# Patient Record
Sex: Female | Born: 1945 | Race: White | Hispanic: No | Marital: Single | Smoking: Former smoker
Health system: Southern US, Community
[De-identification: ages and names within clinical notes are randomized; demographics above are authoritative.]

## PROBLEM LIST (undated history)

## (undated) DIAGNOSIS — J449 Chronic obstructive pulmonary disease, unspecified: Secondary | ICD-10-CM

## (undated) DIAGNOSIS — R17 Unspecified jaundice: Secondary | ICD-10-CM

## (undated) DIAGNOSIS — R0600 Dyspnea, unspecified: Secondary | ICD-10-CM

## (undated) DIAGNOSIS — C801 Malignant (primary) neoplasm, unspecified: Secondary | ICD-10-CM

## (undated) DIAGNOSIS — M1990 Unspecified osteoarthritis, unspecified site: Secondary | ICD-10-CM

## (undated) HISTORY — PX: TONSILLECTOMY: SUR1361

## (undated) HISTORY — PX: COLONOSCOPY: SHX174

## (undated) HISTORY — PX: BUNIONECTOMY: SHX129

---

## 2006-04-01 ENCOUNTER — Ambulatory Visit: Payer: Self-pay | Admitting: Unknown Physician Specialty

## 2006-06-04 ENCOUNTER — Ambulatory Visit: Payer: Self-pay | Admitting: Obstetrics and Gynecology

## 2006-07-30 ENCOUNTER — Ambulatory Visit: Payer: Self-pay | Admitting: Unknown Physician Specialty

## 2007-06-17 ENCOUNTER — Ambulatory Visit: Payer: Self-pay | Admitting: Specialist

## 2007-08-03 ENCOUNTER — Ambulatory Visit: Payer: Self-pay | Admitting: Unknown Physician Specialty

## 2007-09-04 ENCOUNTER — Ambulatory Visit: Payer: Self-pay | Admitting: Allergy

## 2008-06-21 ENCOUNTER — Ambulatory Visit: Payer: Self-pay | Admitting: Specialist

## 2009-07-06 ENCOUNTER — Ambulatory Visit: Payer: Self-pay | Admitting: Specialist

## 2010-07-10 ENCOUNTER — Ambulatory Visit: Payer: Self-pay | Admitting: Specialist

## 2011-07-25 ENCOUNTER — Ambulatory Visit: Payer: Self-pay | Admitting: Specialist

## 2012-07-27 ENCOUNTER — Ambulatory Visit: Payer: Self-pay | Admitting: Specialist

## 2012-08-07 ENCOUNTER — Ambulatory Visit: Payer: Self-pay | Admitting: Unknown Physician Specialty

## 2013-07-28 ENCOUNTER — Ambulatory Visit: Payer: Self-pay | Admitting: Family Medicine

## 2013-08-03 DIAGNOSIS — Z85820 Personal history of malignant melanoma of skin: Secondary | ICD-10-CM | POA: Insufficient documentation

## 2014-07-29 ENCOUNTER — Ambulatory Visit: Payer: Self-pay | Admitting: Family Medicine

## 2015-06-22 ENCOUNTER — Other Ambulatory Visit: Payer: Self-pay | Admitting: Family Medicine

## 2015-06-22 DIAGNOSIS — Z1231 Encounter for screening mammogram for malignant neoplasm of breast: Secondary | ICD-10-CM

## 2015-08-01 ENCOUNTER — Ambulatory Visit
Admission: RE | Admit: 2015-08-01 | Discharge: 2015-08-01 | Disposition: A | Discharge status: Home / Self Care | Payer: Medicare Other | Source: Ambulatory Visit | Attending: Family Medicine | Admitting: Family Medicine

## 2015-08-01 DIAGNOSIS — Z1231 Encounter for screening mammogram for malignant neoplasm of breast: Secondary | ICD-10-CM | POA: Diagnosis present

## 2015-08-01 HISTORY — DX: Malignant (primary) neoplasm, unspecified: C80.1

## 2016-06-14 ENCOUNTER — Other Ambulatory Visit: Payer: Self-pay | Admitting: Family Medicine

## 2016-06-21 ENCOUNTER — Other Ambulatory Visit: Payer: Self-pay | Admitting: Family Medicine

## 2016-06-21 DIAGNOSIS — Z7185 Encounter for immunization safety counseling: Secondary | ICD-10-CM | POA: Insufficient documentation

## 2016-06-21 DIAGNOSIS — Z1231 Encounter for screening mammogram for malignant neoplasm of breast: Secondary | ICD-10-CM

## 2016-06-21 DIAGNOSIS — Z7189 Other specified counseling: Secondary | ICD-10-CM | POA: Insufficient documentation

## 2016-08-02 ENCOUNTER — Ambulatory Visit
Admission: RE | Admit: 2016-08-02 | Discharge: 2016-08-02 | Disposition: A | Discharge status: Home / Self Care | Payer: Medicare Other | Source: Ambulatory Visit | Attending: Family Medicine | Admitting: Family Medicine

## 2016-08-02 ENCOUNTER — Other Ambulatory Visit: Payer: Self-pay | Admitting: Family Medicine

## 2016-08-02 DIAGNOSIS — Z1231 Encounter for screening mammogram for malignant neoplasm of breast: Secondary | ICD-10-CM | POA: Diagnosis present

## 2016-10-06 ENCOUNTER — Emergency Department: Payer: Medicare Other

## 2016-10-06 ENCOUNTER — Inpatient Hospital Stay
Admission: EM | Admit: 2016-10-06 | Discharge: 2016-10-10 | DRG: 190 | Disposition: A | Discharge status: Home Health | Payer: Medicare Other | Attending: Internal Medicine | Admitting: Internal Medicine

## 2016-10-06 ENCOUNTER — Encounter: Payer: Self-pay | Admitting: Emergency Medicine

## 2016-10-06 DIAGNOSIS — Z79899 Other long term (current) drug therapy: Secondary | ICD-10-CM | POA: Diagnosis not present

## 2016-10-06 DIAGNOSIS — J440 Chronic obstructive pulmonary disease with acute lower respiratory infection: Secondary | ICD-10-CM | POA: Diagnosis present

## 2016-10-06 DIAGNOSIS — M6281 Muscle weakness (generalized): Secondary | ICD-10-CM

## 2016-10-06 DIAGNOSIS — J9600 Acute respiratory failure, unspecified whether with hypoxia or hypercapnia: Secondary | ICD-10-CM

## 2016-10-06 DIAGNOSIS — J9621 Acute and chronic respiratory failure with hypoxia: Secondary | ICD-10-CM | POA: Diagnosis present

## 2016-10-06 DIAGNOSIS — F419 Anxiety disorder, unspecified: Secondary | ICD-10-CM | POA: Diagnosis present

## 2016-10-06 DIAGNOSIS — R739 Hyperglycemia, unspecified: Secondary | ICD-10-CM

## 2016-10-06 DIAGNOSIS — Z85820 Personal history of malignant melanoma of skin: Secondary | ICD-10-CM

## 2016-10-06 DIAGNOSIS — R0602 Shortness of breath: Secondary | ICD-10-CM | POA: Diagnosis present

## 2016-10-06 DIAGNOSIS — Z880 Allergy status to penicillin: Secondary | ICD-10-CM | POA: Diagnosis not present

## 2016-10-06 DIAGNOSIS — J449 Chronic obstructive pulmonary disease, unspecified: Secondary | ICD-10-CM

## 2016-10-06 DIAGNOSIS — J9601 Acute respiratory failure with hypoxia: Secondary | ICD-10-CM | POA: Diagnosis not present

## 2016-10-06 DIAGNOSIS — J441 Chronic obstructive pulmonary disease with (acute) exacerbation: Secondary | ICD-10-CM | POA: Diagnosis present

## 2016-10-06 DIAGNOSIS — R0902 Hypoxemia: Secondary | ICD-10-CM

## 2016-10-06 DIAGNOSIS — J189 Pneumonia, unspecified organism: Secondary | ICD-10-CM | POA: Diagnosis present

## 2016-10-06 DIAGNOSIS — F17210 Nicotine dependence, cigarettes, uncomplicated: Secondary | ICD-10-CM | POA: Diagnosis present

## 2016-10-06 DIAGNOSIS — J181 Lobar pneumonia, unspecified organism: Secondary | ICD-10-CM | POA: Diagnosis not present

## 2016-10-06 HISTORY — DX: Chronic obstructive pulmonary disease, unspecified: J44.9

## 2016-10-06 LAB — COMPREHENSIVE METABOLIC PANEL
ALK PHOS: 66 U/L (ref 38–126)
ALT: 15 U/L (ref 14–54)
AST: 21 U/L (ref 15–41)
Albumin: 4.4 g/dL (ref 3.5–5.0)
Anion gap: 7 (ref 5–15)
BUN: 10 mg/dL (ref 6–20)
CALCIUM: 9.3 mg/dL (ref 8.9–10.3)
CHLORIDE: 99 mmol/L — AB (ref 101–111)
CO2: 30 mmol/L (ref 22–32)
CREATININE: 0.61 mg/dL (ref 0.44–1.00)
Glucose, Bld: 147 mg/dL — ABNORMAL HIGH (ref 65–99)
Potassium: 4.7 mmol/L (ref 3.5–5.1)
Sodium: 136 mmol/L (ref 135–145)
TOTAL PROTEIN: 7.7 g/dL (ref 6.5–8.1)
Total Bilirubin: 0.7 mg/dL (ref 0.3–1.2)

## 2016-10-06 LAB — TROPONIN I

## 2016-10-06 LAB — PROCALCITONIN

## 2016-10-06 LAB — MRSA PCR SCREENING: MRSA by PCR: NEGATIVE

## 2016-10-06 LAB — CBC
HCT: 54.7 % — ABNORMAL HIGH (ref 35.0–47.0)
Hemoglobin: 17.9 g/dL — ABNORMAL HIGH (ref 12.0–16.0)
MCH: 32.3 pg (ref 26.0–34.0)
MCHC: 32.7 g/dL (ref 32.0–36.0)
MCV: 98.8 fL (ref 80.0–100.0)
PLATELETS: 283 10*3/uL (ref 150–440)
RBC: 5.53 MIL/uL — AB (ref 3.80–5.20)
RDW: 14.4 % (ref 11.5–14.5)
WBC: 10.8 10*3/uL (ref 3.6–11.0)

## 2016-10-06 LAB — GLUCOSE, CAPILLARY
GLUCOSE-CAPILLARY: 154 mg/dL — AB (ref 65–99)
GLUCOSE-CAPILLARY: 157 mg/dL — AB (ref 65–99)

## 2016-10-06 MED ORDER — IPRATROPIUM-ALBUTEROL 0.5-2.5 (3) MG/3ML IN SOLN
3.0000 mL | Freq: Once | RESPIRATORY_TRACT | Status: AC
  Administered 2016-10-06: 3 mL via RESPIRATORY_TRACT

## 2016-10-06 MED ORDER — GUAIFENESIN ER 600 MG PO TB12
600.0000 mg | ORAL_TABLET | Freq: Two times a day (BID) | ORAL | Status: DC
  Administered 2016-10-06 – 2016-10-10 (×8): 600 mg via ORAL
  Filled 2016-10-06 (×8): qty 1

## 2016-10-06 MED ORDER — IPRATROPIUM-ALBUTEROL 0.5-2.5 (3) MG/3ML IN SOLN
RESPIRATORY_TRACT | Status: AC
  Filled 2016-10-06: qty 3

## 2016-10-06 MED ORDER — BISACODYL 10 MG RE SUPP: 10.0000 mg | Freq: Every day | RECTAL | Status: DC | PRN

## 2016-10-06 MED ORDER — ACETAMINOPHEN 650 MG RE SUPP: 650.0000 mg | Freq: Four times a day (QID) | RECTAL | Status: DC | PRN

## 2016-10-06 MED ORDER — TIOTROPIUM BROMIDE MONOHYDRATE 18 MCG IN CAPS
18.0000 ug | ORAL_CAPSULE | Freq: Every day | RESPIRATORY_TRACT | Status: DC
  Administered 2016-10-06: 18 ug via RESPIRATORY_TRACT
  Filled 2016-10-06: qty 5

## 2016-10-06 MED ORDER — METHYLPREDNISOLONE SODIUM SUCC 125 MG IJ SOLR
125.0000 mg | Freq: Once | INTRAMUSCULAR | Status: AC
  Administered 2016-10-06: 125 mg via INTRAVENOUS

## 2016-10-06 MED ORDER — METHYLPREDNISOLONE SODIUM SUCC 125 MG IJ SOLR
60.0000 mg | Freq: Four times a day (QID) | INTRAMUSCULAR | Status: DC
  Administered 2016-10-06 – 2016-10-07 (×3): 60 mg via INTRAVENOUS
  Filled 2016-10-06 (×3): qty 2

## 2016-10-06 MED ORDER — METHYLPREDNISOLONE SODIUM SUCC 125 MG IJ SOLR
INTRAMUSCULAR | Status: AC
  Administered 2016-10-06: 125 mg via INTRAVENOUS
  Filled 2016-10-06: qty 2

## 2016-10-06 MED ORDER — MONTELUKAST SODIUM 10 MG PO TABS
10.0000 mg | ORAL_TABLET | Freq: Every day | ORAL | Status: DC
  Administered 2016-10-06 – 2016-10-09 (×4): 10 mg via ORAL
  Filled 2016-10-06 (×4): qty 1

## 2016-10-06 MED ORDER — IPRATROPIUM-ALBUTEROL 0.5-2.5 (3) MG/3ML IN SOLN
RESPIRATORY_TRACT | Status: AC
  Administered 2016-10-06: 3 mL
  Filled 2016-10-06: qty 9

## 2016-10-06 MED ORDER — DOCUSATE SODIUM 100 MG PO CAPS
100.0000 mg | ORAL_CAPSULE | Freq: Two times a day (BID) | ORAL | Status: DC
  Administered 2016-10-06 – 2016-10-10 (×8): 100 mg via ORAL
  Filled 2016-10-06 (×8): qty 1

## 2016-10-06 MED ORDER — IPRATROPIUM-ALBUTEROL 0.5-2.5 (3) MG/3ML IN SOLN
3.0000 mL | Freq: Four times a day (QID) | RESPIRATORY_TRACT | Status: DC
  Administered 2016-10-06: 3 mL via RESPIRATORY_TRACT

## 2016-10-06 MED ORDER — IPRATROPIUM-ALBUTEROL 0.5-2.5 (3) MG/3ML IN SOLN
3.0000 mL | RESPIRATORY_TRACT | Status: DC
  Administered 2016-10-06 – 2016-10-07 (×6): 3 mL via RESPIRATORY_TRACT
  Filled 2016-10-06 (×6): qty 3

## 2016-10-06 MED ORDER — MOMETASONE FURO-FORMOTEROL FUM 200-5 MCG/ACT IN AERO
2.0000 | INHALATION_SPRAY | Freq: Two times a day (BID) | RESPIRATORY_TRACT | Status: DC
  Administered 2016-10-06 – 2016-10-10 (×7): 2 via RESPIRATORY_TRACT
  Filled 2016-10-06: qty 8.8

## 2016-10-06 MED ORDER — ACETAMINOPHEN 325 MG PO TABS
650.0000 mg | ORAL_TABLET | Freq: Four times a day (QID) | ORAL | Status: DC | PRN
  Administered 2016-10-08 – 2016-10-09 (×2): 650 mg via ORAL
  Filled 2016-10-06 (×2): qty 2

## 2016-10-06 MED ORDER — LEVOFLOXACIN IN D5W 750 MG/150ML IV SOLN
750.0000 mg | INTRAVENOUS | Status: DC
  Filled 2016-10-06: qty 150

## 2016-10-06 MED ORDER — FAMOTIDINE IN NACL 20-0.9 MG/50ML-% IV SOLN
20.0000 mg | Freq: Two times a day (BID) | INTRAVENOUS | Status: DC
  Administered 2016-10-06 – 2016-10-07 (×2): 20 mg via INTRAVENOUS
  Filled 2016-10-06 (×2): qty 50

## 2016-10-06 MED ORDER — ONDANSETRON HCL 4 MG PO TABS: 4.0000 mg | ORAL_TABLET | Freq: Four times a day (QID) | ORAL | Status: DC | PRN

## 2016-10-06 MED ORDER — LORAZEPAM 2 MG/ML IJ SOLN
INTRAMUSCULAR | Status: AC
  Administered 2016-10-06: 1 mg
  Filled 2016-10-06: qty 1

## 2016-10-06 MED ORDER — LEVOFLOXACIN IN D5W 750 MG/150ML IV SOLN
750.0000 mg | Freq: Once | INTRAVENOUS | Status: AC
  Administered 2016-10-06: 750 mg via INTRAVENOUS
  Filled 2016-10-06: qty 150

## 2016-10-06 MED ORDER — INSULIN ASPART 100 UNIT/ML ~~LOC~~ SOLN
0.0000 [IU] | Freq: Three times a day (TID) | SUBCUTANEOUS | Status: DC
  Administered 2016-10-07 – 2016-10-09 (×6): 1 [IU] via SUBCUTANEOUS
  Filled 2016-10-06 (×7): qty 1

## 2016-10-06 MED ORDER — NICOTINE 21 MG/24HR TD PT24
21.0000 mg | MEDICATED_PATCH | Freq: Every day | TRANSDERMAL | Status: DC
  Administered 2016-10-07: 21 mg via TRANSDERMAL
  Filled 2016-10-06 (×3): qty 1

## 2016-10-06 MED ORDER — HEPARIN SODIUM (PORCINE) 5000 UNIT/ML IJ SOLN
5000.0000 [IU] | Freq: Three times a day (TID) | INTRAMUSCULAR | Status: DC
  Administered 2016-10-06 – 2016-10-07 (×3): 5000 [IU] via SUBCUTANEOUS
  Filled 2016-10-06 (×3): qty 1

## 2016-10-06 MED ORDER — LORAZEPAM 2 MG/ML IJ SOLN
1.0000 mg | INTRAMUSCULAR | Status: DC | PRN
  Administered 2016-10-06 – 2016-10-07 (×3): 1 mg via INTRAVENOUS
  Filled 2016-10-06 (×3): qty 1

## 2016-10-06 MED ORDER — BENZONATATE 100 MG PO CAPS
100.0000 mg | ORAL_CAPSULE | Freq: Two times a day (BID) | ORAL | Status: DC
  Administered 2016-10-06 – 2016-10-10 (×8): 100 mg via ORAL
  Filled 2016-10-06 (×8): qty 1

## 2016-10-06 MED ORDER — ONDANSETRON HCL 4 MG/2ML IJ SOLN
4.0000 mg | Freq: Four times a day (QID) | INTRAMUSCULAR | Status: DC | PRN
  Administered 2016-10-07 – 2016-10-09 (×4): 4 mg via INTRAVENOUS
  Filled 2016-10-06 (×4): qty 2

## 2016-10-06 NOTE — ED Notes (Signed)
Rt in with pt to place on bipap

## 2016-10-06 NOTE — H&P (Signed)
History and Physical    Margaret Stevenson [id number] DOB: 12-May-1946 DOA: 10/06/2016  Referring physician: Dr. Kerman Passey PCP: Dion Body, MD  Specialists: Dr. Raul Del  Chief Complaint: SOB  HPI: Margaret Stevenson is a 70 y.o. female has a past medical history significant for COPD and asthma with continued tobacco use now with 2-3 day hx of worsening SOB and cough. Some fever. Denies CP. No N/V/D. In ER, pt markedly hypoxic. Placed on BiPAP in ER and given IV steroids. Doing better now, off BiPAP on 4L O2 per [place]. CXR shows LLL PNA. She is now admitted.  Review of Systems: The patient denies anorexia,  weight loss,, vision loss, decreased hearing, hoarseness, chest pain, syncope,  peripheral edema, balance deficits, hemoptysis, abdominal pain, melena, hematochezia, severe indigestion/heartburn, hematuria, incontinence, genital sores, muscle weakness, suspicious skin lesions, transient blindness, difficulty walking, depression, unusual weight change, abnormal bleeding, enlarged lymph nodes, angioedema, and breast masses.   Past Medical History:  Diagnosis Date  . Cancer ([place])    melanoma 07/2013  . COPD (chronic obstructive pulmonary disease) ([place])    History reviewed. No pertinent surgical history. Social History:  reports that she has been smoking Cigarettes.  She has been smoking about 2.00 packs per day. She has never used smokeless tobacco. She reports that she does not drink alcohol. Her drug history is not on file.  Allergies  Allergen Reactions  . Penicillins Hives    Has patient had a PCN reaction causing immediate rash, facial/tongue/throat swelling, SOB or lightheadedness with hypotension: no Has patient had a PCN reaction causing severe rash involving mucus membranes or skin necrosis: no Has patient had a PCN reaction that required hospitalization no Has patient had a PCN reaction occurring within the last 10 years: no If all of the above answers are "NO", then  may proceed with Cephalosporin use.     Family History  Problem Relation Age of Onset  . Breast cancer Mother 42    Prior to Admission medications   Medication Sig Start Date End Date Taking? Authorizing Provider  albuterol (VENTOLIN HFA) 108 (90 Base) MCG/ACT inhaler Inhale into the lungs. 08/27/16 08/27/17 Yes Historical Provider, MD  benzonatate (TESSALON) 100 MG capsule Take by mouth. 10/05/16 10/15/16 Yes Historical Provider, MD  etodolac (LODINE) 500 MG tablet Take by mouth. 06/26/16 06/26/17 Yes Historical Provider, MD  levofloxacin (LEVAQUIN) 500 MG tablet Take by mouth. 10/05/16 10/12/16 Yes Historical Provider, MD  predniSONE (DELTASONE) 50 MG tablet Take by mouth. 10/05/16 10/10/16 Yes Historical Provider, MD  tiotropium (SPIRIVA) 18 MCG inhalation capsule Place into inhaler and inhale. 08/27/16 08/27/17 Yes Historical Provider, MD   Physical Exam: Vitals:   10/06/16 1600 10/06/16 1630 10/06/16 1700 10/06/16 1748  BP: (!) 173/91 139/76 133/69 (!) 108/58  Pulse: (!) 119 (!) 125 (!) 116 (!) 118  Resp:    20  Temp:      TempSrc:      SpO2: 98% 96% 98%   Weight:      Height:         General:  Acutely ill appearing in moderate distress, WDWN, /AT  Eyes: PERRL, EOMI, no scleral icterus, conjunctiva clear  ENT: dry oropharynx without exudate, TM's benign, dentition poor  Neck: supple, no lymphadenopathy. No bruits or thyromegaly  Cardiovascular: rapid rate with regular rhythm without MRG; 2+ peripheral pulses, no JVD, no peripheral edema  Respiratory: decreased breath sounds with diffuse wheezes and left lower rhonchi. No dullness. Respiratory effort increased  Abdomen: soft,  non tender to palpation, positive bowel sounds, no guarding, no rebound  Skin: no rashes or lesions  Musculoskeletal: normal bulk and tone, no joint swelling  Psychiatric: normal mood and affect, A&OX3  Neurologic: CN 2-12 grossly intact, Motor strength 5/5 in all 4 groups with symmetric DTR's  and non-focal sensory exam  Labs on Admission:  Basic Metabolic Panel:  Recent Labs Lab 10/06/16 1600  NA 136  K 4.7  CL 99*  CO2 30  GLUCOSE 147*  BUN 10  CREATININE 0.61  CALCIUM 9.3   Liver Function Tests:  Recent Labs Lab 10/06/16 1600  AST 21  ALT 15  ALKPHOS 66  BILITOT 0.7  PROT 7.7  ALBUMIN 4.4   No results for input(s): LIPASE, AMYLASE in the last 168 hours. No results for input(s): AMMONIA in the last 168 hours. CBC:  Recent Labs Lab 10/06/16 1600  WBC 10.8  HGB 17.9*  HCT 54.7*  MCV 98.8  PLT 283   Cardiac Enzymes:  Recent Labs Lab 10/06/16 1600  TROPONINI <0.03    BNP (last 3 results) No results for input(s): BNP in the last 8760 hours.  ProBNP (last 3 results) No results for input(s): PROBNP in the last 8760 hours.  CBG: No results for input(s): GLUCAP in the last 168 hours.  Radiological Exams on Admission: Dg Chest Portable 1 View  Result Date: 10/06/2016 CLINICAL DATA:  Shortness of breath, hypoxia EXAM: PORTABLE CHEST 1 VIEW COMPARISON:  None. FINDINGS: Bibasilar scarring. Superimposed left lower lobe pneumonia is possible. No pleural effusion or pneumothorax. Cardiomegaly. IMPRESSION: Bibasilar scarring. Superimposed left lower lobe pneumonia is possible. Electronically Signed   By: Julian Hy M.D.   On: 10/06/2016 16:42    EKG: Independently reviewed.  Assessment/Plan Principal Problem:   Acute respiratory failure (HCC) Active Problems:   COPD exacerbation ([place])   CAP (community acquired pneumonia)   Hyperglycemia   Will admit to Stepdown as FULL CODE. Begin IV ABX with IV steroids and optimal pulmonary toilet. Consult Pulmonology. Follow sugars. Nicoderm patch and prn Ativan. Repeat labs in AM  Diet: clear liquids Fluids: NS@75  DVT Prophylaxis: SQ Heparin  Code Status: FULL  Family Communication: yes  Disposition Plan: home  Time spent: 55 min

## 2016-10-06 NOTE — ED Provider Notes (Signed)
[facility] Emergency Department Provider Note  Time seen: 3:59 PM  I have reviewed the triage vital signs and the nursing notes.   HISTORY  Chief Complaint Shortness of Breath    HPI Margaret Stevenson is a 70 y.o. female With a past medical history of COPD on oxygen as needed. Presents to the emergency department with significant shortness of breath. According to the patient yesterday she began feeling short of breath. Went to the walk-in clinic for evaluation and was prescribed prednisone. Today she states significant worsening shortness of breath. Upon arrival patient satting in the 80s on 2 L of oxygen. Patient states she normally does not wear oxygen but will wear it if she needs it  On 4 L patient satting 90%.Patient states cough but denies any fever or sputum production.Patient denies any chest pain  Past Medical History:  Diagnosis Date  . Cancer ([place])    melanoma 07/2013  . COPD (chronic obstructive pulmonary disease) (HCC)     There are no active problems to display for this patient.   No past surgical history on file.  Prior to Admission medications   Not on File    Allergies  Allergen Reactions  . Penicillins Hives    Family History  Problem Relation Age of Onset  . Breast cancer Mother 56    Social History Social History  Substance Use Topics  . Smoking status: Current Every Day Smoker    Packs/day: 2.00    Types: Cigarettes  . Smokeless tobacco: Never Used  . Alcohol use No    Review of Systems Constitutional: Negative for fever. Cardiovascular: Negative for chest pain. Respiratory: positive for shortness of breath. Positive for cough. Gastrointestinal: Negative for abdominal pain Neurological: Negative for headache 10-point ROS otherwise negative.  ____________________________________________   PHYSICAL EXAM:  VITAL SIGNS: ED Triage Vitals  Enc Vitals Group     BP 10/06/16 1533 (!) 158/81     Pulse Rate  10/06/16 1533 (!) 121     Resp 10/06/16 1533 20     Temp 10/06/16 1533 98.1 F (36.7 C)     Temp Source 10/06/16 1533 Oral     SpO2 10/06/16 1533 (!) 77 %     Weight 10/06/16 1533 145 lb (65.8 kg)     Height 10/06/16 1533 5\' 7"  (1.702 m)     Head Circumference --      Peak Flow --      Pain Score 10/06/16 1534 0     Pain Loc --      Pain Edu? --      Excl. in [place]? --     Constitutional: Alert and oriented. Well appearing and in no distress. Eyes: Normal exam ENT   Head: Normocephalic and atraumatic.   Mouth/Throat: Mucous membranes are moist. Cardiovascular: regular rhythm, rate around 120 bpm,no murmur. Respiratory: significantly decreased breath sounds bilaterally with mild expiratory wheeze. Gastrointestinal: Soft and nontender. No distention.   Musculoskeletal: Nontender with normal range of motion in all extremities. No lower extremity tenderness or edema. Neurologic:  Normal speech and language. No gross focal neurologic deficits Psychiatric: Mood and affect are normal.  ____________________________________________    EKG  EKG reviewed and interpreted by myself shows sinus tachycardia 116 bpm, narrow QRS, normal axis, largely normal intervals with nonspecific ST changes without ST elevation.  ____________________________________________    RADIOLOGY  Chest x-ray shows possible left lower lobe pneumonia.  ____________________________________________   INITIAL IMPRESSION / ASSESSMENT AND PLAN / ED  COURSE  Pertinent labs & imaging results that were available during my care of the patient were reviewed by me and considered in my medical decision making (see chart for details).  Patient presents to the emergency department with significant shortness of breath. Patient smokes 2 packs of cigarettes daily. Highly suspect COPD exacerbation. We'll treat with Duoeb's, Solu-Medrol and continue to cause monitor in the emergency department while awaiting lab and x-ray  imaging results.  Patient states worsening trouble breathing, she is very anxious, unwilling to keep nebulizer treatment on face. We will dose Ativan and continue to monitor.  Continued worsening trouble breathing now with diaphoresis. We'll place on BiPAP.   CRITICAL CARE Performed by: Harvest Dark   Total critical care time: 30 minutes  Critical care time was exclusive of separately billable procedures and treating other patients.  Critical care was necessary to treat or prevent imminent or life-threatening deterioration.  Critical care was time spent personally by me on the following activities: development of treatment plan with patient and/or surrogate as well as nursing, discussions with consultants, evaluation of patient's response to treatment, examination of patient, obtaining history from patient or surrogate, ordering and performing treatments and interventions, ordering and review of laboratory studies, ordering and review of radiographic studies, pulse oximetry and re-evaluation of patient's condition.   Patient is doing much better on BiPAP. I discussed with the patient going head and being admitted to the hospital. Patient is extremely reluctant to being admitted. States she wants to go home.  she is currently on BiPAP. We will discontinue BiPAP and assess the patient. I spoke with the patient multiple times with my extremely strong recommendation to be admitted to the hospital. Patient states she wants to go home. I discussed with the patient if she continues to feel well off BiPAP and she wants to go home she'll need to sign out [place], the patient is agreeable to plan. Otherwise the patient will be continued on steroids, and antibiotics.  Patient finally agreeable to admission. Continues with shortness of breath. Doing better off of BiPAP on 4 L nasal cannula. ____________________________________________   FINAL CLINICAL IMPRESSION(S) / ED  DIAGNOSES  COPD exacerbation    Harvest Dark, MD 10/06/16 1747

## 2016-10-06 NOTE — Progress Notes (Signed)
Pharmacy Antibiotic Note  Corrie Budinger is a 70 y.o. female admitted on 10/06/2016 with community acquired pneumonia.  Pharmacy has been consulted for levofloxacin dosing.  Plan: Patient received levofloxacin 750mg  IV x 1 in the ED. Will continue patient on levofloxacin 750mg  IV Q24hr with next scheduled dose on 10/30 at 1800.    Procalcitonin level is pending.  Height: 5\' 7"  (170.2 cm) Weight: 145 lb (65.8 kg) IBW/kg (Calculated) : 61.6  Temp (24hrs), Avg:98.1 F (36.7 C), Min:98.1 F (36.7 C), Max:98.1 F (36.7 C)   Recent Labs Lab 10/06/16 1600  WBC 10.8  CREATININE 0.61    Estimated Creatinine Clearance: 64.5 mL/min (by C-G formula based on SCr of 0.61 mg/dL).    Allergies  Allergen Reactions  . Penicillins Hives    Antimicrobials this admission: Levofloxacin 10/29 >>   Dose adjustments this admission: N/A  Microbiology results: 10/29 BCx: pending  10/29 Procalcitonin:   Pharmacy will continue to monitor and adjust per consult.   Simpson,Michael L 10/06/2016 6:08 PM

## 2016-10-06 NOTE — ED Triage Notes (Signed)
C/O SOB x 3-4 days ago.  Wearing home oxygen 2L/[place].  States pulse ox has been in the 80's today at rest and drops into the 40's and [id number] with excertion.

## 2016-10-07 DIAGNOSIS — J181 Lobar pneumonia, unspecified organism: Secondary | ICD-10-CM

## 2016-10-07 DIAGNOSIS — J9601 Acute respiratory failure with hypoxia: Secondary | ICD-10-CM

## 2016-10-07 DIAGNOSIS — J441 Chronic obstructive pulmonary disease with (acute) exacerbation: Secondary | ICD-10-CM

## 2016-10-07 LAB — CBC
HEMATOCRIT: 52.5 % — AB (ref 35.0–47.0)
HEMOGLOBIN: 17.2 g/dL — AB (ref 12.0–16.0)
MCH: 32.3 pg (ref 26.0–34.0)
MCHC: 32.6 g/dL (ref 32.0–36.0)
MCV: 98.9 fL (ref 80.0–100.0)
Platelets: 284 10*3/uL (ref 150–440)
RBC: 5.31 MIL/uL — ABNORMAL HIGH (ref 3.80–5.20)
RDW: 14.4 % (ref 11.5–14.5)
WBC: 7.5 10*3/uL (ref 3.6–11.0)

## 2016-10-07 LAB — COMPREHENSIVE METABOLIC PANEL
ALBUMIN: 4.1 g/dL (ref 3.5–5.0)
ALT: 15 U/L (ref 14–54)
ANION GAP: 5 (ref 5–15)
AST: 21 U/L (ref 15–41)
Alkaline Phosphatase: 63 U/L (ref 38–126)
BUN: 12 mg/dL (ref 6–20)
CO2: 32 mmol/L (ref 22–32)
Calcium: 9.3 mg/dL (ref 8.9–10.3)
Chloride: 99 mmol/L — ABNORMAL LOW (ref 101–111)
Creatinine, Ser: 0.62 mg/dL (ref 0.44–1.00)
GFR calc Af Amer: >60 mL/min (ref >60)
GFR calc non Af Amer: >60 mL/min (ref >60)
GLUCOSE: 151 mg/dL — AB (ref 65–99)
POTASSIUM: 4.7 mmol/L (ref 3.5–5.1)
SODIUM: 136 mmol/L (ref 135–145)
Total Bilirubin: 0.5 mg/dL (ref 0.3–1.2)
Total Protein: 7.6 g/dL (ref 6.5–8.1)

## 2016-10-07 LAB — GLUCOSE, CAPILLARY
GLUCOSE-CAPILLARY: 130 mg/dL — AB (ref 65–99)
Glucose-Capillary: 130 mg/dL — ABNORMAL HIGH (ref 65–99)
Glucose-Capillary: 116 mg/dL — ABNORMAL HIGH (ref 65–99)
Glucose-Capillary: 127 mg/dL — ABNORMAL HIGH (ref 65–99)

## 2016-10-07 MED ORDER — LEVOFLOXACIN 750 MG PO TABS
750.0000 mg | ORAL_TABLET | ORAL | Status: DC
  Administered 2016-10-07 – 2016-10-09 (×3): 750 mg via ORAL
  Filled 2016-10-07 (×2): qty 2
  Filled 2016-10-07: qty 1

## 2016-10-07 MED ORDER — ALPRAZOLAM 1 MG PO TABS
1.0000 mg | ORAL_TABLET | Freq: Three times a day (TID) | ORAL | Status: DC
  Administered 2016-10-07 (×2): 1 mg via ORAL
  Filled 2016-10-07 (×3): qty 1

## 2016-10-07 MED ORDER — BUDESONIDE 0.5 MG/2ML IN SUSP
0.5000 mg | Freq: Two times a day (BID) | RESPIRATORY_TRACT | Status: DC
  Administered 2016-10-07 – 2016-10-10 (×7): 0.5 mg via RESPIRATORY_TRACT
  Filled 2016-10-07 (×7): qty 2

## 2016-10-07 MED ORDER — IPRATROPIUM-ALBUTEROL 0.5-2.5 (3) MG/3ML IN SOLN
3.0000 mL | Freq: Four times a day (QID) | RESPIRATORY_TRACT | Status: DC
  Administered 2016-10-08 (×2): 3 mL via RESPIRATORY_TRACT
  Filled 2016-10-07 (×2): qty 3

## 2016-10-07 MED ORDER — MORPHINE SULFATE (PF) 2 MG/ML IV SOLN
2.0000 mg | INTRAVENOUS | Status: DC | PRN
  Administered 2016-10-07 – 2016-10-09 (×7): 2 mg via INTRAVENOUS
  Filled 2016-10-07 (×7): qty 1

## 2016-10-07 MED ORDER — IPRATROPIUM-ALBUTEROL 0.5-2.5 (3) MG/3ML IN SOLN
3.0000 mL | RESPIRATORY_TRACT | Status: DC | PRN
  Filled 2016-10-07: qty 3

## 2016-10-07 MED ORDER — METHYLPREDNISOLONE SODIUM SUCC 40 MG IJ SOLR
40.0000 mg | Freq: Two times a day (BID) | INTRAMUSCULAR | Status: DC
Start: 2016-10-07 — End: 2016-10-10
  Administered 2016-10-07 – 2016-10-09 (×5): 40 mg via INTRAVENOUS
  Filled 2016-10-07 (×5): qty 1

## 2016-10-07 MED ORDER — ENOXAPARIN SODIUM 40 MG/0.4ML ~~LOC~~ SOLN
40.0000 mg | SUBCUTANEOUS | Status: DC
  Administered 2016-10-07 – 2016-10-09 (×3): 40 mg via SUBCUTANEOUS
  Filled 2016-10-07 (×3): qty 0.4

## 2016-10-07 MED ORDER — ORAL CARE MOUTH RINSE
15.0000 mL | Freq: Two times a day (BID) | OROMUCOSAL | Status: DC
  Administered 2016-10-08 (×2): 15 mL via OROMUCOSAL

## 2016-10-07 MED ORDER — FAMOTIDINE 20 MG PO TABS
20.0000 mg | ORAL_TABLET | Freq: Two times a day (BID) | ORAL | Status: DC
  Administered 2016-10-07 – 2016-10-10 (×7): 20 mg via ORAL
  Filled 2016-10-07 (×8): qty 1

## 2016-10-07 MED ORDER — TIOTROPIUM BROMIDE MONOHYDRATE 18 MCG IN CAPS: 18.0000 ug | ORAL_CAPSULE | Freq: Every day | RESPIRATORY_TRACT | Status: DC

## 2016-10-07 MED ORDER — ALPRAZOLAM 1 MG PO TABS: 1.0000 mg | ORAL_TABLET | Freq: Three times a day (TID) | ORAL | Status: DC | PRN

## 2016-10-07 MED ORDER — CHLORHEXIDINE GLUCONATE 0.12 % MT SOLN
15.0000 mL | Freq: Two times a day (BID) | OROMUCOSAL | Status: DC
  Administered 2016-10-07 – 2016-10-10 (×6): 15 mL via OROMUCOSAL
  Filled 2016-10-07 (×5): qty 15

## 2016-10-07 NOTE — Progress Notes (Signed)
Pt was extremely anxious, wheezing, RR 30's, accessory muscle use, SpO2 86%.  Notified respiratory therapy, who had recently given pt duoneb treatment.  Called and spoke with Lindwood Qua NP, order to place pt on bipap and give prn ativan for anxiety.  Post ativan administration and Bipap, pt now resting comfortably, SpO2 96%, RR 20.  Continue to monitor.

## 2016-10-07 NOTE — Plan of Care (Signed)
Pt has voided 125ml since 7am. Md notified. Will cont to monitor

## 2016-10-07 NOTE — Progress Notes (Signed)
Pharmacy Antibiotic Note  Diasy Stevenson is a 70 y.o. female admitted on 10/06/2016 with community acquired pneumonia.  Pharmacy has been consulted for levofloxacin dosing.  Plan: Continue Levaquin 750 mg iv q 24 hours. F/U need for continuing abx with PCT < 0.1. After discussion with Dr. Mortimer Fries, will continue abx for now.   Height: 5\' 7"  (170.2 cm) Weight: 143 lb 1.3 oz (64.9 kg) IBW/kg (Calculated) : 61.6  Temp (24hrs), Avg:98.2 F (36.8 C), Min:97.9 F (36.6 C), Max:98.5 F (36.9 C)   Recent Labs Lab 10/06/16 1600 10/07/16 0322  WBC 10.8 7.5  CREATININE 0.61 0.62    Estimated Creatinine Clearance: 64.5 mL/min (by C-G formula based on SCr of 0.62 mg/dL).    Allergies  Allergen Reactions  . Penicillins Hives    Has patient had a PCN reaction causing immediate rash, facial/tongue/throat swelling, SOB or lightheadedness with hypotension: no Has patient had a PCN reaction causing severe rash involving mucus membranes or skin necrosis: no Has patient had a PCN reaction that required hospitalization no Has patient had a PCN reaction occurring within the last 10 years: no If all of the above answers are "NO", then may proceed with Cephalosporin use.     Antimicrobials this admission: Levofloxacin 10/29 >>   Dose adjustments this admission: N/A  Microbiology results: 10/29 BCx: pending  10/29 Procalcitonin:   Pharmacy will continue to monitor and adjust per consult.   Margaret Stevenson D 10/07/2016 12:01 PM

## 2016-10-07 NOTE — Progress Notes (Signed)
Chaplain was making rounds and visited room Ic3. Provided the ministry of prayer and spiritual support.    10/07/16 1700  Clinical Encounter Type  Visited With Patient  Visit Type Initial;Spiritual support  Referral From Nurse  Spiritual Encounters  Spiritual Needs Prayer

## 2016-10-07 NOTE — Progress Notes (Addendum)
[facility] NAME: Margaret Stevenson    MR#:  [id number]  DATE OF BIRTH:  06/13/1946  SUBJECTIVE:   Lethargic, resting BIPAP+ REVIEW OF SYSTEMS:   Review of Systems  Unable to perform ROS: Severe respiratory distress   Tolerating Diet: Tolerating PT:   DRUG ALLERGIES:   Allergies  Allergen Reactions  . Penicillins Hives    Has patient had a PCN reaction causing immediate rash, facial/tongue/throat swelling, SOB or lightheadedness with hypotension: no Has patient had a PCN reaction causing severe rash involving mucus membranes or skin necrosis: no Has patient had a PCN reaction that required hospitalization no Has patient had a PCN reaction occurring within the last 10 years: no If all of the above answers are "NO", then may proceed with Cephalosporin use.     VITALS:  Blood pressure (!) 104/57, pulse 90, temperature 98.2 F (36.8 C), temperature source Axillary, resp. rate 17, height 5\' 7"  (1.702 m), weight 64.9 kg (143 lb 1.3 oz), SpO2 94 %.  PHYSICAL EXAMINATION:   Physical Exam  GENERAL:  70 y.o.-year-old patient lying in the bed with no acute distress. Appears critically ill EYES: Pupils equal, round, reactive to light and accommodation. No scleral icterus. Extraocular muscles intact.  HEENT: Head atraumatic, normocephalic. Oropharynx and nasopharynx clear.  NECK:  Supple, no jugular venous distention. No thyroid enlargement, no tenderness.  LUNGS: distant breath sounds bilaterally, no wheezing, rales, rhonchi. No use of accessory muscles of respiration.  CARDIOVASCULAR: S1, S2 normal. No murmurs, rubs, or gallops.  ABDOMEN: Soft, nontender, nondistended. Bowel sounds present. No organomegaly or mass.  EXTREMITIES: No cyanosis, clubbing or edema b/l.    NEUROLOGIC: Cranial nerves II through XII are intact. No focal Motor or sensory deficits b/l.   PSYCHIATRIC:  patient is alert and oriented x 3.  SKIN: No  obvious rash, lesion, or ulcer.   LABORATORY PANEL:  CBC  Recent Labs Lab 10/07/16 0322  WBC 7.5  HGB 17.2*  HCT 52.5*  PLT 284    Chemistries   Recent Labs Lab 10/07/16 0322  NA 136  K 4.7  CL 99*  CO2 32  GLUCOSE 151*  BUN 12  CREATININE 0.62  CALCIUM 9.3  AST 21  ALT 15  ALKPHOS 63  BILITOT 0.5   Cardiac Enzymes  Recent Labs Lab 10/06/16 1600  TROPONINI <0.03   RADIOLOGY:  Dg Chest Portable 1 View  Result Date: 10/06/2016 CLINICAL DATA:  Shortness of breath, hypoxia EXAM: PORTABLE CHEST 1 VIEW COMPARISON:  None. FINDINGS: Bibasilar scarring. Superimposed left lower lobe pneumonia is possible. No pleural effusion or pneumothorax. Cardiomegaly. IMPRESSION: Bibasilar scarring. Superimposed left lower lobe pneumonia is possible. Electronically Signed   By: Julian Hy M.D.   On: 10/06/2016 16:42   ASSESSMENT AND PLAN:  Margaret Stevenson is a 70 yo female with PMH of melanoma and COPD and current tobacco abuse. Patient [facility] with 2-3 day history of worsening shortness of breath, mild fever and cough. Patient was placed on BiPAP due to hypoxia and was given IV steroids. Her chest x-ray was concerning for left lower lobe pneumonia.  1. Acute on chronic COPD exacerbation with hypoxia -Precipitated with anxiety -Appreciate ICU M.D. Input -By mouth Xanax round-the-clock -Try to wean her off BiPAP if possible. According to RN patient desatted easily earlier  2. Left upper lobe pneumonia -IV Levaquin  3. Ongoing tobacco abuse advised smoking cessation  4. DVT prophylaxis subcutaneous Lovenox Case discussed with Care Management/Social Worker. Management plans discussed with the patient, family and they are in agreement.  CODE STATUS:Full DVT Prophylaxis: Lovenox TOTAL TIME TAKING CARE OF THIS PATIENT:  30 minutes.  >50% time spent on counselling and coordination of care  POSSIBLE D/C IN  one to 2DAYS,  DEPENDING ON CLINICAL CONDITION.  Note: This dictation was prepared with Dragon dictation along with smaller phrase technology. Any transcriptional errors that result from this process are unintentional.  Edward Guthmiller M.D on 10/07/2016 at 2:21 PM  Between 7am to 6pm - Pager - [phone]  After 6pm go to [email or web address] - password [email or web address] [facility]  Office  [phone]  CC: Primary care physician; Dion Body, MD

## 2016-10-07 NOTE — Progress Notes (Signed)
CONCERNING: IV to Oral Route Change Policy  RECOMMENDATION: This patient is receiving famotidine by the intravenous route.  Based on criteria approved by the Pharmacy and Therapeutics Committee, the intravenous medication(s) is/are being converted to the equivalent oral dose form(s).   DESCRIPTION: These criteria include:  The patient is eating (either orally or via tube) and/or has been taking other orally administered medications for a least 24 hours  The patient has no evidence of active gastrointestinal bleeding or impaired GI absorption (gastrectomy, short bowel, patient on TNA or NPO).  If you have questions about this conversion, please contact the Pharmacy Department  []   [phone] )  [facility] [x]   [phone] )  [facility] []   [phone] )  [facility] []   [phone] )  [facility] []   [phone] )  [place], [facility] 10/07/2016 2:45 PM

## 2016-10-07 NOTE — Consult Note (Signed)
PULMONARY / CRITICAL CARE MEDICINE   Name: Margaret Stevenson MRN: [id number] DOB: September 06, 1946    ADMISSION DATE:  10/06/2016 CONSULTATION DATE:  10/06/16  REFERRING MD:  Dr. Idelle Crouch  CHIEF COMPLAINT:  Shortness of breath  HISTORY OF PRESENT ILLNESS:   Margaret Stevenson is a 70 yo female with PMH of melanoma and COPD and current tobacco abuse. Patient presents to [facility] on 10/29 with 2-3 day history of worsening shortness of breath, mild fever and cough. Patient was placed on BiPAP due to hypoxia and was given IV steroids. Her chest x-ray was concerning for left lower lobe pneumonia. She was admitted to the ICU for observation with PCCM consult on.  PAST MEDICAL HISTORY :  She  has a past medical history of Cancer ([place]) and COPD (chronic obstructive pulmonary disease) ([place]).  PAST SURGICAL HISTORY: She  has no past surgical history on file.  Allergies  Allergen Reactions  . Penicillins Hives    Has patient had a PCN reaction causing immediate rash, facial/tongue/throat swelling, SOB or lightheadedness with hypotension: no Has patient had a PCN reaction causing severe rash involving mucus membranes or skin necrosis: no Has patient had a PCN reaction that required hospitalization no Has patient had a PCN reaction occurring within the last 10 years: no If all of the above answers are "NO", then may proceed with Cephalosporin use.     No current facility-administered medications on file prior to encounter.    No current outpatient prescriptions on file prior to encounter.    FAMILY HISTORY:  Her indicated that the status of her mother is unknown.    SOCIAL HISTORY: She  reports that she has been smoking Cigarettes.  She has been smoking about 2.00 packs per day. She has never used smokeless tobacco. She reports that she does not drink alcohol.  REVIEW OF SYSTEMS:   Unable to obtain  SUBJECTIVE:  Unable to obtain  VITAL SIGNS: BP 133/60 (BP  Location: Left Arm)   Pulse (!) 101   Temp 97.9 F (36.6 C) (Axillary)   Resp 17   Ht 5\' 7"  (1.702 m)   Wt 63.2 kg (139 lb 5.3 oz)   SpO2 97%   BMI 21.82 kg/m   HEMODYNAMICS:    VENTILATOR SETTINGS:    INTAKE / OUTPUT: No intake/output data recorded.  PHYSICAL EXAMINATION: General:  White female, in some distress Neuro:  Awake, alert, oriented, follows commands, no focal deficits HEENT:  Atraumatic, normocephalic, no discharge, no JVD appreciated Cardiovascular:  Tachycardic, S1 and S2, no MRG noted Lungs:  Faint expiratory wheezes throughout, no rhonchi or crackles noted Abdomen:  Soft, nontender, nondistended, active bowel sounds Musculoskeletal:  No inflammation/deformity noted Skin:  Grossly intact  LABS:  BMET  Recent Labs Lab 10/06/16 1600  NA 136  K 4.7  CL 99*  CO2 30  BUN 10  CREATININE 0.61  GLUCOSE 147*    Electrolytes  Recent Labs Lab 10/06/16 1600  CALCIUM 9.3    CBC  Recent Labs Lab 10/06/16 1600  WBC 10.8  HGB 17.9*  HCT 54.7*  PLT 283    Coag's No results for input(s): APTT, INR in the last 168 hours.  Sepsis Markers  Recent Labs Lab 10/06/16 1600  PROCALCITON <0.10    ABG No results for input(s): PHART, PCO2ART, PO2ART in the last 168 hours.  Liver Enzymes  Recent Labs Lab 10/06/16 1600  AST 21  ALT 15  ALKPHOS 66  BILITOT  0.7  ALBUMIN 4.4    Cardiac Enzymes  Recent Labs Lab 10/06/16 1600  TROPONINI <0.03    Glucose  Recent Labs Lab 10/06/16 1924 10/06/16 2134  GLUCAP 154* 157*    Imaging Dg Chest Portable 1 View  Result Date: 10/06/2016 CLINICAL DATA:  Shortness of breath, hypoxia EXAM: PORTABLE CHEST 1 VIEW COMPARISON:  None. FINDINGS: Bibasilar scarring. Superimposed left lower lobe pneumonia is possible. No pleural effusion or pneumothorax. Cardiomegaly. IMPRESSION: Bibasilar scarring. Superimposed left lower lobe pneumonia is possible. Electronically Signed   By: Julian Hy  M.D.   On: 10/06/2016 16:42     STUDIES:  none  CULTURES: 10/29 blood culture>> 10/29 sputum culture  ANTIBIOTICS: 10/29 levofloxacin>>  SIGNIFICANT EVENTS: 10/29 patient admitted to the intensive care unit with COPD exacerbation and?pna  LINES/TUBES: none  DISCUSSION: 70 yo female admitted with COPD exacerbation and? Left lower lobe PNA  ASSESSMENT / PLAN:  PULMONARY A: Acute on chronic respiratory failure COPD exacerbation Community-acquired pneumonia Tobacco abuse P:   Continue BiPAP to keep oxygen sats greater than 92%, wean as tolerated Continue levofloxacin Continue bronchodilators Continue Singulair Continue steroids, taper Nicotine patch Tobacco cessation counseling Continue benzonatate/guaifeenessin Pulmonary hygiene  CARDIOVASCULAR A:  No active issues P:  Continuous telemetry  RENAL A:   No active issues P:   Replace electrolytes per ICU protocol Follow chemistry intermittently  GASTROINTESTINAL A:   No active issues P:   Famotidine for GI prophylaxis  HEMATOLOGIC A:   No active issues P:  Heparin for DVT prophylaxis  INFECTIOUS A:   Community-acquired pneumonia P:   Continue antibiotics Follow cultures Monitor fever curve  ENDOCRINE A:   Hyperglycemia possibly steroid induced P:   BS every 4 hours SSI coverage  NEUROLOGIC A:   ANXIETY P:   Lorazepam PRN Minimize sedating medication     Gwendolin Briel,AG-ACNP Pulmonary and [place]   10/07/2016, 3:35 AM

## 2016-10-08 LAB — GLUCOSE, CAPILLARY
Glucose-Capillary: 112 mg/dL — ABNORMAL HIGH (ref 65–99)
Glucose-Capillary: 116 mg/dL — ABNORMAL HIGH (ref 65–99)
Glucose-Capillary: 126 mg/dL — ABNORMAL HIGH (ref 65–99)
Glucose-Capillary: 125 mg/dL — ABNORMAL HIGH (ref 65–99)

## 2016-10-08 LAB — PROCALCITONIN

## 2016-10-08 MED ORDER — SODIUM CHLORIDE 0.9 % IV SOLN
INTRAVENOUS | Status: DC
  Administered 2016-10-08 – 2016-10-09 (×2): via INTRAVENOUS
  Administered 2016-10-10: 1000 mL via INTRAVENOUS

## 2016-10-08 MED ORDER — ALBUTEROL SULFATE (2.5 MG/3ML) 0.083% IN NEBU
2.5000 mg | INHALATION_SOLUTION | RESPIRATORY_TRACT | Status: DC | PRN
  Administered 2016-10-08 – 2016-10-10 (×2): 2.5 mg via RESPIRATORY_TRACT
  Filled 2016-10-08 (×2): qty 3

## 2016-10-08 MED ORDER — IPRATROPIUM-ALBUTEROL 0.5-2.5 (3) MG/3ML IN SOLN
3.0000 mL | RESPIRATORY_TRACT | Status: DC
  Administered 2016-10-08 – 2016-10-10 (×10): 3 mL via RESPIRATORY_TRACT
  Filled 2016-10-08 (×12): qty 3

## 2016-10-08 MED ORDER — ALPRAZOLAM 1 MG PO TABS
1.0000 mg | ORAL_TABLET | Freq: Three times a day (TID) | ORAL | Status: DC | PRN
  Administered 2016-10-08 – 2016-10-10 (×4): 1 mg via ORAL
  Filled 2016-10-08 (×4): qty 1

## 2016-10-08 MED ORDER — TIOTROPIUM BROMIDE MONOHYDRATE 18 MCG IN CAPS
18.0000 ug | ORAL_CAPSULE | Freq: Every day | RESPIRATORY_TRACT | Status: DC
  Administered 2016-10-08 – 2016-10-10 (×3): 18 ug via RESPIRATORY_TRACT
  Filled 2016-10-08: qty 5

## 2016-10-08 NOTE — Progress Notes (Signed)
[facility] [place] NAME: Margaret Stevenson    MR#:  [id number]  DATE OF BIRTH:  1946/03/13  SUBJECTIVE:   More awake feeling hungry as he desats easily into the 80s with talking REVIEW OF SYSTEMS:   Review of Systems  Unable to perform ROS: Other  Constitutional: Negative for chills, fever and weight loss.  HENT: Negative for ear discharge, ear pain and nosebleeds.   Eyes: Negative for blurred vision, pain and discharge.  Respiratory: Positive for shortness of breath. Negative for sputum production, wheezing and stridor.   Cardiovascular: Negative for chest pain, palpitations, orthopnea and PND.  Gastrointestinal: Negative for abdominal pain, diarrhea, nausea and vomiting.  Genitourinary: Negative for frequency and urgency.  Musculoskeletal: Negative for back pain and joint pain.  Neurological: Positive for weakness. Negative for sensory change, speech change and focal weakness.  Psychiatric/Behavioral: Negative for depression and hallucinations. The patient is not nervous/anxious.    Tolerating Diet:yes Tolerating PT: pending  DRUG ALLERGIES:   Allergies  Allergen Reactions  . Penicillins Hives    Has patient had a PCN reaction causing immediate rash, facial/tongue/throat swelling, SOB or lightheadedness with hypotension: no Has patient had a PCN reaction causing severe rash involving mucus membranes or skin necrosis: no Has patient had a PCN reaction that required hospitalization no Has patient had a PCN reaction occurring within the last 10 years: no If all of the above answers are "NO", then may proceed with Cephalosporin use.     VITALS:  Blood pressure 134/62, pulse (!) 105, temperature 97.5 F (36.4 C), temperature source Axillary, resp. rate 20, height 5\' 7"  (1.702 m), weight 63.1 kg (139 lb 1.8 oz), SpO2 94 %.  PHYSICAL EXAMINATION:   Physical Exam  GENERAL:  70 y.o.-year-old patient lying in the bed with no  acute distress. Appears critically ill EYES: Pupils equal, round, reactive to light and accommodation. No scleral icterus. Extraocular muscles intact.  HEENT: Head atraumatic, normocephalic. Oropharynx and nasopharynx clear.  NECK:  Supple, no jugular venous distention. No thyroid enlargement, no tenderness.  LUNGS: distant breath sounds bilaterally, no wheezing, rales, rhonchi. No use of accessory muscles of respiration.  CARDIOVASCULAR: S1, S2 normal. No murmurs, rubs, or gallops.  ABDOMEN: Soft, nontender, nondistended. Bowel sounds present. No organomegaly or mass.  EXTREMITIES: No cyanosis, clubbing or edema b/l.    NEUROLOGIC: Cranial nerves II through XII are intact. No focal Motor or sensory deficits b/l.   PSYCHIATRIC:  patient is alert and oriented x 3.  SKIN: No obvious rash, lesion, or ulcer.   LABORATORY PANEL:  CBC  Recent Labs Lab 10/07/16 0322  WBC 7.5  HGB 17.2*  HCT 52.5*  PLT 284    Chemistries   Recent Labs Lab 10/07/16 0322  NA 136  K 4.7  CL 99*  CO2 32  GLUCOSE 151*  BUN 12  CREATININE 0.62  CALCIUM 9.3  AST 21  ALT 15  ALKPHOS 63  BILITOT 0.5   Cardiac Enzymes  Recent Labs Lab 10/06/16 1600  TROPONINI <0.03   RADIOLOGY:  Dg Chest Portable 1 View  Result Date: 10/06/2016 CLINICAL DATA:  Shortness of breath, hypoxia EXAM: PORTABLE CHEST 1 VIEW COMPARISON:  None. FINDINGS: Bibasilar scarring. Superimposed left lower lobe pneumonia is possible. No pleural effusion or pneumothorax. Cardiomegaly. IMPRESSION: Bibasilar scarring. Superimposed left lower lobe pneumonia is possible. Electronically Signed   By: Julian Hy M.D.   On: 10/06/2016 16:42   ASSESSMENT AND PLAN:  Margaret Stevenson is a 70 yo female with PMH of melanoma and COPD and current tobacco abuse. Patient presents to [facility] on 10/29 with 2-3 day history of worsening shortness of breath, mild fever and cough. Patient was placed on BiPAP due to  hypoxia and was given IV steroids. Her chest x-ray was concerning for left lower lobe pneumonia.  1. Acute on chronic COPD exacerbation with hypoxia -Precipitated with anxiety -Appreciate ICU M.D. Input -By mouth Xanax prn -BIPAP prn -pt desats easily. Currently on 5 liter [place]  2. Left upper lobe pneumonia -IV Levaquin  3. Ongoing tobacco abuse advised smoking cessation  4. DVT prophylaxis subcutaneous Lovenox  Case discussed with Care Management/Social Worker. Management plans discussed with the patient, family and they are in agreement.  CODE STATUS:Full DVT Prophylaxis: Lovenox TOTAL TIME TAKING CARE OF THIS PATIENT:  30 minutes.  >50% time spent on counselling and coordination of care pt and her friend  POSSIBLE D/C IN  one to 2DAYS, DEPENDING ON CLINICAL CONDITION.  Note: This dictation was prepared with Dragon dictation along with smaller phrase technology. Any transcriptional errors that result from this process are unintentional.  Kyllian Clingerman M.D on 10/08/2016 at 3:37 PM  Between 7am to 6pm - Pager - [phone]  After 6pm go to [email or web address] - password [email or web address] [facility]  Office  [phone]  CC: Primary care physician; Dion Body, MD

## 2016-10-08 NOTE — Progress Notes (Signed)
Order soft diet per dr Mortimer Fries, we iwll increase ned frequency and also start alb Q2H PRN, start spiriva also.

## 2016-10-08 NOTE — Progress Notes (Signed)
Pharmacy Antibiotic Note  Margaret Stevenson is a 70 y.o. female admitted on 10/06/2016 with community acquired pneumonia.  Pharmacy has been consulted for levofloxacin dosing.  Plan: Continue Levaquin 750 mg po q 24 hours. F/U need for continuing abx with PCT < 0.1. After discussion with Dr. Mortimer Fries, will continue abx for now.   Height: 5\' 7"  (170.2 cm) Weight: 139 lb 1.8 oz (63.1 kg) IBW/kg (Calculated) : 61.6  Temp (24hrs), Avg:97.8 F (36.6 C), Min:97.4 F (36.3 C), Max:98 F (36.7 C)   Recent Labs Lab 10/06/16 1600 10/07/16 0322  WBC 10.8 7.5  CREATININE 0.61 0.62    Estimated Creatinine Clearance: 64.5 mL/min (by C-G formula based on SCr of 0.62 mg/dL).    Allergies  Allergen Reactions  . Penicillins Hives    Has patient had a PCN reaction causing immediate rash, facial/tongue/throat swelling, SOB or lightheadedness with hypotension: no Has patient had a PCN reaction causing severe rash involving mucus membranes or skin necrosis: no Has patient had a PCN reaction that required hospitalization no Has patient had a PCN reaction occurring within the last 10 years: no If all of the above answers are "NO", then may proceed with Cephalosporin use.     Antimicrobials this admission: Levofloxacin 10/29 >>   Microbiology results: 10/29 BCx: NGTD SCx: sent 10/29 MRSA PCR: negative  Pharmacy will continue to monitor and adjust per consult.   Ulice Dash D 10/08/2016 1:15 PM

## 2016-10-08 NOTE — Consult Note (Signed)
PULMONARY / CRITICAL CARE MEDICINE   Name: Margaret Stevenson MRN: [id number] DOB: [birth year]    ADMISSION DATE:  10/06/2016 CONSULTATION DATE:  10/06/16  REFERRING MD:  Dr. Idelle Crouch  CHIEF COMPLAINT:  Shortness of breath  HISTORY OF PRESENT ILLNESS:   Margaret Stevenson is a 70 yo female with PMH of melanoma and COPD and current tobacco abuse. Patient presents to [facility] on 10/29 with 2-3 day history of worsening shortness of breath, mild fever and cough. Patient was placed on BiPAP due to hypoxia and was given IV steroids. Her chest x-ray was concerning for left lower lobe pneumonia. She was admitted to the ICU for observation with PCCM consult on.  SUBJECTIVE: Remains on biPAP and breathing seesm to have improved Will try off biPAP this AM Persistent wheezing  Allergies  Allergen Reactions  . Penicillins Hives    Has patient had a PCN reaction causing immediate rash, facial/tongue/throat swelling, SOB or lightheadedness with hypotension: no Has patient had a PCN reaction causing severe rash involving mucus membranes or skin necrosis: no Has patient had a PCN reaction that required hospitalization no Has patient had a PCN reaction occurring within the last 10 years: no If all of the above answers are "NO", then may proceed with Cephalosporin use.     No current facility-administered medications on file prior to encounter.    No current outpatient prescriptions on file prior to encounter.    REVIEW OF SYSTEMS:    Review of Systems:  Gen:  Denies  fever, sweats, chills weigh loss   HEENT: Denies blurred vision, double vision, ear pain, eye pain, hearing loss, nose bleeds, sore throat  Cardiac:  No dizziness, chest pain or heaviness, chest tightness,edema  Resp:  +, shortness of breath,+wheezing, -hemoptysis,   Gi: Denies swallowing difficulty, stomach pain, nausea or vomiting, diarrhea, constipation, bowel incontinence   Other:  All  other systems negative   VITAL SIGNS: BP 117/74 (BP Location: Left Arm)   Pulse (!) 111   Temp 98 F (36.7 C)   Resp (!) 24   Ht 5\' 7"  (1.702 m)   Wt 139 lb 1.8 oz (63.1 kg)   SpO2 91%   BMI 21.79 kg/m   HEMODYNAMICS:    VENTILATOR SETTINGS: FiO2 (%):  [35 %-45 %] 35 %  INTAKE / OUTPUT: I/O last 3 completed shifts: In: 410 [P.O.:300; Other:10; IV Piggyback:100] Out: 125 [Urine:125]  PHYSICAL EXAMINATION: General:  White female,no distress Neuro:  Awake, alert, oriented, follows commands, no focal deficits HEENT:  Atraumatic, normocephalic, no discharge, no JVD appreciated Cardiovascular:  Tachycardic, S1 and S2, no MRG noted Lungs:  Faint expiratory wheezes throughout, no rhonchi or crackles noted Abdomen:  Soft, nontender, nondistended, active bowel sounds Musculoskeletal:  No inflammation/deformity noted Skin:  Grossly intact  LABS:  BMET  Recent Labs Lab 10/06/16 1600 10/07/16 0322  NA 136 136  K 4.7 4.7  CL 99* 99*  CO2 30 32  BUN 10 12  CREATININE 0.61 0.62  GLUCOSE 147* 151*    Electrolytes  Recent Labs Lab 10/06/16 1600 10/07/16 0322  CALCIUM 9.3 9.3    CBC  Recent Labs Lab 10/06/16 1600 10/07/16 0322  WBC 10.8 7.5  HGB 17.9* 17.2*  HCT 54.7* 52.5*  PLT 283 284    Coag's No results for input(s): APTT, INR in the last 168 hours.  Sepsis Markers  Recent Labs Lab 10/06/16 1600 10/08/16 0446  PROCALCITON <0.10 <0.10    ABG  No results for input(s): PHART, PCO2ART, PO2ART in the last 168 hours.  Liver Enzymes  Recent Labs Lab 10/06/16 1600 10/07/16 0322  AST 21 21  ALT 15 15  ALKPHOS 66 63  BILITOT 0.7 0.5  ALBUMIN 4.4 4.1    Cardiac Enzymes  Recent Labs Lab 10/06/16 1600  TROPONINI <0.03    Glucose  Recent Labs Lab 10/06/16 2134 10/07/16 0754 10/07/16 1158 10/07/16 1535 10/07/16 2134 10/08/16 0715  GLUCAP 157* 130* 130* 116* 127* 112*    Imaging No results found.   STUDIES:   none  CULTURES: 10/29 blood culture>> 10/29 sputum culture  ANTIBIOTICS: 10/29 levofloxacin>>  SIGNIFICANT EVENTS: 10/29 patient admitted to the intensive care unit with COPD exacerbation and?pna  LINES/TUBES: none  DISCUSSION: 70 yo female admitted with COPD exacerbation and Left lower lobe PNA  ASSESSMENT / PLAN:  PULMONARY A: Acute on chronic respiratory failure COPD exacerbation Community-acquired pneumonia Tobacco abuse P:   Wean off BiPAP to keep oxygen sats greater than 92%, wean as tolerated Continue levofloxacin Continue bronchodilators Continue Singulair Continue steroids, taper Nicotine patch Tobacco cessation counseling Continue benzonatate/guaifeenessin Pulmonary hygiene  CARDIOVASCULAR A:  No active issues P:  Continuous telemetry -SD status   RENAL A:   No active issues P:   Replace electrolytes per ICU protocol Follow chemistry intermittently  GASTROINTESTINAL A:   No active issues P:   Famotidine for GI prophylaxis  HEMATOLOGIC A:   No active issues P:  Heparin for DVT prophylaxis  INFECTIOUS A:   Community-acquired pneumonia P:   Continue antibiotics Follow cultures Monitor fever curve  ENDOCRINE A:   Hyperglycemia possibly steroid induced P:   BS every 4 hours SSI coverage  NEUROLOGIC A:   ANXIETY P:   Lorazepam PRN Minimize sedating medication      The Patient requires high complexity decision making for assessment and support, frequent evaluation and titration of therapies.  Patient satisfied with Plan of action and management. All questions answered  Corrin Parker, M.D.  [facility]  Medical Director [place] Director [facility] Cardio-Pulmonary Department

## 2016-10-08 NOTE — Care Management Note (Signed)
Case Management Note  Patient Details  Name: Margaret Stevenson MRN: [id number] Date of Birth: 1946-05-03  Subjective/Objective:                  Met with patient briefly to see if she already has home O2. She seems a little anxious over her cell phone being changed and her not being able to reach it- I discussed with her nurse. Patient states she is from home alone. She denies having a nebulizer or home O2. She states her PCP is Dr. Netty Starring and she sees Dr. Raul Del for pulmonary. I contacted patient's son Barnabas Lister [phone] that lives in [place]. He states he was here in [place] visiting with her about 4 weeks ago. He states she "does have an O2 concentrator but doesn't know supplying agency". He states she had not been wearing it out when she shopped but could only walk about 20 feet and would need to rest. She does not have any other DME in the home. He states that she "has been able to drive". He could not confirm her PCP name/address. Action/Plan:   [facility] to [facility] for [facility] O[facility] and nebulizer. Updated Corene Cornea with [facility] that patient may already have home O2 but no portable tanks. O2 assessment will be needed prior to discharge. RNCM to continue to follow.      Expected Discharge Date:                  Expected Discharge Plan:     In-House Referral:     Discharge planning Services  CM Consult  Post Acute Care Choice:  Durable Medical Equipment Choice offered to:  Patient  DME Arranged:  Oxygen, Nebulizer/meds DME Agency:  [place]:    [place] Agency:     Status of Service:  In process, will continue to follow  If discussed at Long Length of Stay Meetings, dates discussed:    Additional Comments:  Marshell Garfinkel, RN 10/08/2016, 10:26 AM

## 2016-10-08 NOTE — Progress Notes (Signed)
Pt up to recliner approx 2 hours this shift with a lot of encouragement.  O2 sats in mid nineties, drop at times so O2 [place] has been 4-6 liters all shift.

## 2016-10-08 NOTE — Progress Notes (Signed)
Informed dr patel that pt's output is low; she seems dry.  Start fluids at 34 per Dr

## 2016-10-08 NOTE — Progress Notes (Signed)
Per Dr Posey Pronto change xanax to PRN at patient's request, pt rarely takes this medication

## 2016-10-09 ENCOUNTER — Encounter: Payer: Self-pay | Admitting: Student

## 2016-10-09 LAB — GLUCOSE, CAPILLARY
GLUCOSE-CAPILLARY: 141 mg/dL — AB (ref 65–99)
GLUCOSE-CAPILLARY: 129 mg/dL — AB (ref 65–99)
GLUCOSE-CAPILLARY: 105 mg/dL — AB (ref 65–99)
Glucose-Capillary: 146 mg/dL — ABNORMAL HIGH (ref 65–99)

## 2016-10-09 MED ORDER — TRAMADOL HCL 50 MG PO TABS: 50.0000 mg | ORAL_TABLET | Freq: Three times a day (TID) | ORAL | Status: DC | PRN

## 2016-10-09 NOTE — Evaluation (Signed)
Physical Therapy Evaluation Patient Details Name: Margaret Stevenson MRN: [id number] DOB: 19-Mar-1946 Today's Date: 10/09/2016   History of Present Illness  Pt is a 70 y.o. female presenting to hospital with SOB with hypoxia (O2 sats 80's on 2 L).  Pt admitted to hospital with acute respiratory distress secondary to COPD exacerbation and LLL community acquired PNA.  Pt initially admitted to CCU but now transferred to floor.  PMH includes COPD on home O2 PRN, melanoma, asthma, smokes.  Clinical Impression  Prior to admission, pt was modified independent (used home O2 PRN).  Pt lives in 1 level home with stairs to enter.  Currently pt is min assist supine to sit and CGA with transfers and ambulation a few feet bed to chair with single UE support on IV pole.  Limited ambulation distance d/t SOB and O2 desaturation from 93% at rest to 87% with ambulation (returned to 92% end of session) on 4.5 L/min O2 via nasal cannula.  Pt would benefit from skilled PT to address noted impairments and functional limitations.  Anticipate with continued medical improvement with respiratory status, pt will be able to discharge to home when medically appropriate (HHPT pending pt's progress).    Follow Up Recommendations Home health PT (pending pt progress)    Equipment Recommendations   (TBD)    Recommendations for Other Services       Precautions / Restrictions Precautions Precautions: Fall Restrictions Weight Bearing Restrictions: No      Mobility  Bed Mobility Overal bed mobility: Needs Assistance Bed Mobility: Supine to Sit     Supine to sit: Min assist;HOB elevated     General bed mobility comments: increased time and effort to perform; min assist for trunk  Transfers Overall transfer level: Needs assistance Equipment used: None Transfers: Sit to/from Stand Sit to Stand: Min guard         General transfer comment: increased effort to stand but steady with single UE support on IV  pole  Ambulation/Gait Ambulation/Gait assistance: Min guard Ambulation Distance (Feet): 3 Feet Assistive device: None   Gait velocity: decreased   General Gait Details: decreased B step length/foot clearance/heelstrike; single UE support on IV pole (steady with this technique); limited distance d/t SOB  Stairs            Wheelchair Mobility    Modified Rankin (Stroke Patients Only)       Balance Overall balance assessment: Needs assistance Sitting-balance support: Bilateral upper extremity supported;Feet supported Sitting balance-Leahy Scale: Good     Standing balance support: Single extremity supported;During functional activity (UE support on IV pole) Standing balance-Leahy Scale: Fair                               Pertinent Vitals/Pain Pain Assessment: No/denies pain  HR in 90's during session.    Home Living Family/patient expects to be discharged to:: Private residence Living Arrangements: Alone   Type of Home: House Home Access: Stairs to enter Entrance Stairs-Rails: Psychiatric nurse of Steps: 5-6 Home Layout: One level Home Equipment: None      Prior Function Level of Independence: Independent         Comments: Pt denies any falls in past 6 months.     Hand Dominance        Extremity/Trunk Assessment   Upper Extremity Assessment: Generalized weakness           Lower Extremity Assessment: Generalized weakness  Communication   Communication: No difficulties  Cognition Arousal/Alertness: Awake/alert Behavior During Therapy: WFL for tasks assessed/performed Overall Cognitive Status: Within Functional Limits for tasks assessed                      General Comments General comments (skin integrity, edema, etc.): Pt laying in bed with son present.  Nursing cleared pt for participation in physical therapy.  Pt agreeable to PT session.    Exercises     Assessment/Plan    PT  Assessment Patient needs continued PT services  PT Problem List Decreased strength;Decreased activity tolerance;Decreased balance;Decreased mobility;Cardiopulmonary status limiting activity          PT Treatment Interventions DME instruction;Gait training;Stair training;Functional mobility training;Therapeutic activities;Therapeutic exercise;Balance training;Patient/family education    PT Goals (Current goals can be found in the Care Plan section)  Acute Rehab PT Goals Patient Stated Goal: to not feel SOB with activity PT Goal Formulation: With patient Time For Goal Achievement: 10/23/16 Potential to Achieve Goals: Fair    Frequency Min 2X/week   Barriers to discharge Decreased caregiver support      Co-evaluation               End of Session Equipment Utilized During Treatment: Gait belt;Oxygen (4.5 L/min via nasal cannula) Activity Tolerance: Patient limited by fatigue;Other (comment) (Limited d/t SOB) Patient left: in chair;with call bell/phone within reach;with chair alarm set;with family/visitor present Nurse Communication: Mobility status;Precautions         Time: [id number] PT Time Calculation (min) (ACUTE ONLY): 25 min   Charges:   PT Evaluation $PT Eval Low Complexity: 1 Procedure     PT G CodesLeitha Bleak November 07, 2016, 1:25 PM Leitha Bleak, [place]

## 2016-10-09 NOTE — Clinical Social Work Note (Signed)
Clinical Social Work Assessment  Patient Details  Name: Margaret Stevenson MRN: [id number] Date of Birth: 07-06-1946  Date of referral:  10/09/16               Reason for consult:  Discharge Planning, Facility Placement                Permission sought to share information with:    Permission granted to share information::     Name::        Agency::     Relationship::     Contact Information:     Housing/Transportation Living arrangements for the past 2 months:  Single Family Home Source of Information:  Patient Patient Interpreter Needed:  None Criminal Activity/Legal Involvement Pertinent to Current Situation/Hospitalization:  No - Comment as needed Significant Relationships:  Adult Children Lives with:  Self Do you feel safe going back to the place where you live?  Yes Need for family participation in patient care:  Yes (Comment)  Care giving concerns:  Patient lives alone in [place] and has a son Mali that lives in [place], [place].    Social Worker assessment / plan:  Holiday representative (CSW) received verbal consult from RN case manager that patient may need SNF. PT is recommending home health because patient was a min assist however per PT she was limited due to her pulmonary issues. CSW met with patient alone at bedside to discuss D/C plan. Patient was alert and oriented and was laying in the chair. CSW introduced self and explained role of CSW department. Patient reported that she lives alone and her only adult child Mali lives in [place]. [place]i flew into to [facility] last night. CSW explained home health and SNF options. Patient chose home health and reported that she would not consider SNF. Patient reported that she does have oxygen at home however she hardly uses it. Patient reported that she knows she is going to have to start using oxygen at home. RN case manager is aware of above. CSW will continue to follow and assist as needed.    Employment status:  Disabled (Comment on  whether or not currently receiving Disability), Retired Forensic scientist:  Medicare PT Recommendations:  Home with [place] / Referral to community resources:  Other (Comment Required) (Patient reported that she is going home with home health. )  Patient/Family's Response to care:  Patient refused SNF and reported that she will go home.   Patient/Family's Understanding of and Emotional Response to Diagnosis, Current Treatment, and Prognosis:  Patient was pleasant and thanked CSW for visit.   Emotional Assessment Appearance:  Appears stated age Attitude/Demeanor/Rapport:    Affect (typically observed):  Accepting, Adaptable, Pleasant Orientation:  Oriented to Self, Oriented to Place, Oriented to  Time, Oriented to Situation Alcohol / Substance use:  Not Applicable Psych involvement (Current and /or in the community):  No (Comment)  Discharge Needs  Concerns to be addressed:  Discharge Planning Concerns Readmission within the last 30 days:  No Current discharge risk:  Chronically ill Barriers to Discharge:  Continued Medical Work up   [employer], [name], LCSW 10/09/2016, 3:08 PM

## 2016-10-09 NOTE — Care Management Note (Addendum)
Case Management Note  Patient Details  Name: Margaret Stevenson MRN: [id number] Date of Birth: 01-30-1946  Subjective/Objective:  Met with patient and son, Barnabas Lister, at bedside. Son has been in town from [place] for a few hours. He states he can stay for a few weeks or a few months, depending on the patients needs. During [facility] discussion, patient was sitting up in chair but would not open her eyes but only speak to this CM. She confirms she lives at home alone and prior to admission, she reports she was walking without DME assistance, driving and caring for herself. Son reports that her endurance related to her ambulation and breathing were her biggest obstacle. Patient has no preference for home health needs. Would benefit from SN and PT.               Action/Plan: Referral to [facility] for SN and PT.   Expected Discharge Date:                  Expected Discharge Plan:     In-House Referral:     Discharge planning Services  CM Consult  Post Acute Care Choice:  Durable Medical Equipment Choice offered to:  Patient  DME Arranged:  Oxygen, Nebulizer/meds DME Agency:  [place]:  RN, PT [facility] Agency:  [place]  Status of Service:  In process, will continue to follow  If discussed at Long Length of Stay Meetings, dates discussed:    Additional Comments:  Jolly Mango, RN 10/09/2016, 2:08 PM

## 2016-10-09 NOTE — Progress Notes (Signed)
[place] at [place] NAME: Margaret Stevenson    MR#:  [id number]  DATE OF BIRTH:  August 29, 1946  SUBJECTIVE:   When can I go home?  REVIEW OF SYSTEMS:   Review of Systems  Unable to perform ROS: Other  Constitutional: Negative for chills, fever and weight loss.  HENT: Negative for ear discharge, ear pain and nosebleeds.   Eyes: Negative for blurred vision, pain and discharge.  Respiratory: Positive for shortness of breath. Negative for sputum production, wheezing and stridor.   Cardiovascular: Negative for chest pain, palpitations, orthopnea and PND.  Gastrointestinal: Negative for abdominal pain, diarrhea, nausea and vomiting.  Genitourinary: Negative for frequency and urgency.  Musculoskeletal: Negative for back pain and joint pain.  Neurological: Positive for weakness. Negative for sensory change, speech change and focal weakness.  Psychiatric/Behavioral: Negative for depression and hallucinations. The patient is not nervous/anxious.    Tolerating Diet:yes Tolerating PT: HHPT DRUG ALLERGIES:   Allergies  Allergen Reactions  . Penicillins Hives    Has patient had a PCN reaction causing immediate rash, facial/tongue/throat swelling, SOB or lightheadedness with hypotension: no Has patient had a PCN reaction causing severe rash involving mucus membranes or skin necrosis: no Has patient had a PCN reaction that required hospitalization no Has patient had a PCN reaction occurring within the last 10 years: no If all of the above answers are "NO", then may proceed with Cephalosporin use.     VITALS:  Blood pressure (!) 150/64, pulse 92, temperature 97.8 F (36.6 C), temperature source Oral, resp. rate 18, height 5\' 7"  (1.702 m), weight 64.1 kg (141 lb 6.4 oz), SpO2 96 %.  PHYSICAL EXAMINATION:   Physical Exam  GENERAL:  70 y.o.-year-old patient lying in the bed with no acute distress. Appears critically ill EYES: Pupils equal,  round, reactive to light and accommodation. No scleral icterus. Extraocular muscles intact.  HEENT: Head atraumatic, normocephalic. Oropharynx and nasopharynx clear.  NECK:  Supple, no jugular venous distention. No thyroid enlargement, no tenderness.  LUNGS: distant breath sounds bilaterally, no wheezing, rales, rhonchi. No use of accessory muscles of respiration.  CARDIOVASCULAR: S1, S2 normal. No murmurs, rubs, or gallops.  ABDOMEN: Soft, nontender, nondistended. Bowel sounds present. No organomegaly or mass.  EXTREMITIES: No cyanosis, clubbing or edema b/l.    NEUROLOGIC: Cranial nerves II through XII are intact. No focal Motor or sensory deficits b/l.   PSYCHIATRIC:  patient is alert and oriented x 3.  SKIN: No obvious rash, lesion, or ulcer.   LABORATORY PANEL:  CBC  Recent Labs Lab 10/07/16 0322  WBC 7.5  HGB 17.2*  HCT 52.5*  PLT 284    Chemistries   Recent Labs Lab 10/07/16 0322  NA 136  K 4.7  CL 99*  CO2 32  GLUCOSE 151*  BUN 12  CREATININE 0.62  CALCIUM 9.3  AST 21  ALT 15  ALKPHOS 63  BILITOT 0.5   Cardiac Enzymes  Recent Labs Lab 10/06/16 1600  TROPONINI <0.03   RADIOLOGY:  No results found. ASSESSMENT AND PLAN:  Margaret Stevenson is a 70 yo female with PMH of melanoma and COPD and current tobacco abuse. Patient presents to [facility] on 10/29 with 2-3 day history of worsening shortness of breath, mild fever and cough. Patient was placed on BiPAP due to hypoxia and was given IV steroids. Her chest x-ray was concerning for left lower lobe pneumonia.  1. Acute on chronic COPD exacerbation  with hypoxia -Precipitated with anxiety --By mouth Xanax prn -pt desats easily. Currently on 5 liter [place] -Chest PT -taper steroids. Levaquin total 7 days  2. Left upper lobe pneumonia -IV Levaquin--change to po   3. Ongoing tobacco abuse advised smoking cessation  4. DVT prophylaxis subcutaneous Lovenox  Slowly improving Spoke with  son Barnabas Lister D/c in am if remains stable  Case discussed with Care Management/Social Worker. Management plans discussed with the patient, family and they are in agreement.  CODE STATUS:Full DVT Prophylaxis: Lovenox TOTAL TIME TAKING CARE OF THIS PATIENT:  30 minutes.  >50% time spent on counselling and coordination of care pt and her friend  POSSIBLE D/C IN  one to 2DAYS, DEPENDING ON CLINICAL CONDITION.  Note: This dictation was prepared with Dragon dictation along with smaller phrase technology. Any transcriptional errors that result from this process are unintentional.  Margaret Stevenson M.D on 10/09/2016 at 4:56 PM  Between 7am to 6pm - Pager - [phone]  After 6pm go to [email or web address] - password [email or web address] [place] [facility]  Office  [phone]  CC: Primary care physician; Dion Body, MD

## 2016-10-10 LAB — GLUCOSE, CAPILLARY: GLUCOSE-CAPILLARY: 81 mg/dL (ref 65–99)

## 2016-10-10 LAB — PROCALCITONIN: Procalcitonin: <0.1 ng/mL

## 2016-10-10 MED ORDER — ALPRAZOLAM 1 MG PO TABS: 1.0000 mg | ORAL_TABLET | Freq: Two times a day (BID) | ORAL | 0 refills | Status: DC | PRN

## 2016-10-10 MED ORDER — GUAIFENESIN ER 600 MG PO TB12: 600.0000 mg | ORAL_TABLET | Freq: Two times a day (BID) | ORAL | 0 refills | Status: DC | PRN

## 2016-10-10 MED ORDER — MOMETASONE FURO-FORMOTEROL FUM 200-5 MCG/ACT IN AERO: 2.0000 | INHALATION_SPRAY | Freq: Two times a day (BID) | RESPIRATORY_TRACT | 1 refills | Status: DC

## 2016-10-10 MED ORDER — PREDNISONE 10 MG PO TABS: 60.0000 mg | ORAL_TABLET | Freq: Every day | ORAL | 0 refills | Status: DC

## 2016-10-10 MED ORDER — IPRATROPIUM-ALBUTEROL 0.5-2.5 (3) MG/3ML IN SOLN: 3.0000 mL | RESPIRATORY_TRACT | 1 refills | Status: DC

## 2016-10-10 MED ORDER — PREDNISONE 50 MG PO TABS
60.0000 mg | ORAL_TABLET | Freq: Every day | ORAL | Status: DC
  Administered 2016-10-10: 60 mg via ORAL
  Filled 2016-10-10: qty 1

## 2016-10-10 MED ORDER — MONTELUKAST SODIUM 10 MG PO TABS: 10.0000 mg | ORAL_TABLET | Freq: Every day | ORAL | 1 refills | Status: DC

## 2016-10-10 NOTE — Progress Notes (Signed)
Pt lying in bed sats on RA are 86%

## 2016-10-10 NOTE — Progress Notes (Signed)
Dr. Raul Del  to follow along as this his outpatient

## 2016-10-10 NOTE — Discharge Summary (Signed)
[place] at [place] NAME: Margaret Stevenson    MR#:  [id number]  DATE OF BIRTH:  January 20, 1946  DATE OF ADMISSION:  10/06/2016 ADMITTING PHYSICIAN: Idelle Crouch, MD  DATE OF DISCHARGE: 10/10/16  PRIMARY CARE PHYSICIAN: Dion Body, MD    ADMISSION DIAGNOSIS:  Hypoxia [R09.02] COPD exacerbation ([place]) [J44.1]  DISCHARGE DIAGNOSIS:  Acute on chronic hypoxic respiratory failure Severe emphysema pneumonia  SECONDARY DIAGNOSIS:   Past Medical History:  Diagnosis Date  . Cancer ([place])    melanoma 07/2013  . COPD (chronic obstructive pulmonary disease) [facility])     HOSPITAL COURSE:   Margaret Stevenson is a 70 yo female with PMH of melanoma and COPD and current tobacco abuse.Patient presents to [facility] on 10/29 with 2-3 day history of worsening shortness of breath,mild fever and cough. Patient was placed on BiPAP due to hypoxia and was given IV steroids. Her chest x-ray was concerning for left lower lobe pneumonia.  1. Acute on chronic COPD exacerbation with hypoxia with severe emphysema --By mouth Xanax prn -pt desats easily. Currently on 4.5 liter  -Chest PT -taper steroids. Levaquin total 7 days  2. Left upper lobe pneumonia -IV Levaquin--change to po abxs  3. Ongoing tobacco abuse advised smoking cessation  4. DVT prophylaxis subcutaneous Lovenox  Spoke with son y'day. He  Understands pt has a poor prognosis HHPT, nebulizer and cont oxygen use F/u dr Raul Del as out pt CONSULTS OBTAINED:    DRUG ALLERGIES:   Allergies  Allergen Reactions  . Penicillins Hives    Has patient had a PCN reaction causing immediate rash, facial/tongue/throat swelling, SOB or lightheadedness with hypotension: no Has patient had a PCN reaction causing severe rash involving mucus membranes or skin necrosis: no Has patient had a PCN reaction that required hospitalization no Has patient had a PCN  reaction occurring within the last 10 years: no If all of the above answers are "NO", then may proceed with Cephalosporin use.     DISCHARGE MEDICATIONS:   Current Discharge Medication List    START taking these medications   Details  ALPRAZolam (XANAX) 1 MG tablet Take 1 tablet (1 mg total) by mouth 2 (two) times daily as needed for anxiety. Qty: 15 tablet, Refills: 0    guaiFENesin (MUCINEX) 600 MG 12 hr tablet Take 1 tablet (600 mg total) by mouth 2 (two) times daily as needed. Qty: 15 tablet, Refills: 0    ipratropium-albuterol (DUONEB) 0.5-2.5 (3) MG/3ML SOLN Take 3 mLs by nebulization every 4 (four) hours. Qty: 360 mL, Refills: 1    mometasone-formoterol (DULERA) 200-5 MCG/ACT AERO Inhale 2 puffs into the lungs 2 (two) times daily. Qty: 1 Inhaler, Refills: 1    montelukast (SINGULAIR) 10 MG tablet Take 1 tablet (10 mg total) by mouth at bedtime. Qty: 30 tablet, Refills: 1      CONTINUE these medications which have CHANGED   Details  predniSONE (DELTASONE) 10 MG tablet Take 6 tablets (60 mg total) by mouth daily with breakfast. Taper by 10 mg daily then stop Qty: 21 tablet, Refills: 0      CONTINUE these medications which have NOT CHANGED   Details  albuterol (VENTOLIN HFA) 108 (90 Base) MCG/ACT inhaler Inhale 2 puffs into the lungs every 4 (four) hours as needed.     benzonatate (TESSALON) 100 MG capsule Take 100 mg by mouth 2 (two) times daily.     etodolac (LODINE) 500 MG tablet  Take 500 mg by mouth daily as needed.     levofloxacin (LEVAQUIN) 500 MG tablet Take by mouth daily.     tiotropium (SPIRIVA) 18 MCG inhalation capsule Place 18 mcg into inhaler and inhale at bedtime.         If you experience worsening of your admission symptoms, develop shortness of breath, life threatening emergency, suicidal or homicidal thoughts you must seek medical attention immediately by calling 911 or calling your MD immediately  if symptoms less severe.  You Must read  complete instructions/literature along with all the possible adverse reactions/side effects for all the Medicines you take and that have been prescribed to you. Take any new Medicines after you have completely understood and accept all the possible adverse reactions/side effects.   Please note  You were cared for by a hospitalist during your hospital stay. If you have any questions about your discharge medications or the care you received while you were in the hospital after you are discharged, you can call the unit and asked to speak with the hospitalist on call if the hospitalist that took care of you is not available. Once you are discharged, your primary care physician will handle any further medical issues. Please note that NO REFILLS for any discharge medications will be authorized once you are discharged, as it is imperative that you return to your primary care physician (or establish a relationship with a primary care physician if you do not have one) for your aftercare needs so that they can reassess your need for medications and monitor your lab values. Today   SUBJECTIVE   Improving slowly  VITAL SIGNS:  Blood pressure (!) 119/51, pulse 90, temperature 97 F (36.1 C), temperature source Oral, resp. rate 18, height 5\' 7"  (1.702 m), weight 64.5 kg (142 lb 1.6 oz), SpO2 100 %.  I/O:   Intake/Output Summary (Last 24 hours) at 10/10/16 0744 Last data filed at 10/10/16 0700  Gross per 24 hour  Intake             1020 ml  Output                0 ml  Net             1020 ml    PHYSICAL EXAMINATION:  GENERAL:  70 y.o.-year-old patient lying in the bed with no acute distress. Appears chronically ill EYES: Pupils equal, round, reactive to light and accommodation. No scleral icterus. Extraocular muscles intact.  HEENT: Head atraumatic, normocephalic. Oropharynx and nasopharynx clear.  NECK:  Supple, no jugular venous distention. No thyroid enlargement, no tenderness.  LUNGS: distant  breath sounds bilaterally, no wheezing, rales,rhonchi or crepitation. No use of accessory muscles of respiration.  CARDIOVASCULAR: S1, S2 normal. No murmurs, rubs, or gallops.  ABDOMEN: Soft, non-tender, non-distended. Bowel sounds present. No organomegaly or mass.  EXTREMITIES: No pedal edema, cyanosis, or clubbing.  NEUROLOGIC: Cranial nerves II through XII are intact. Muscle strength 5/5 in all extremities. Sensation intact. Gait not checked.  PSYCHIATRIC: The patient is alert and oriented x 3.  SKIN: No obvious rash, lesion, or ulcer.   DATA REVIEW:   CBC   Recent Labs Lab 10/07/16 0322  WBC 7.5  HGB 17.2*  HCT 52.5*  PLT 284    Chemistries   Recent Labs Lab 10/07/16 0322  NA 136  K 4.7  CL 99*  CO2 32  GLUCOSE 151*  BUN 12  CREATININE 0.62  CALCIUM 9.3  AST 21  ALT 15  ALKPHOS 63  BILITOT 0.5    Microbiology Results   Recent Results (from the past 240 hour(s))  Blood culture (routine x 2)     Status: None (Preliminary result)   Collection Time: 10/06/16  7:30 PM  Result Value Ref Range Status   Specimen Description BLOOD LEFT HAND  Final   Special Requests   Final    BOTTLES DRAWN AEROBIC AND ANAEROBIC 13CCAERO,11CCANA   Culture NO GROWTH 3 DAYS  Final   Report Status PENDING  Incomplete  Blood culture (routine x 2)     Status: None (Preliminary result)   Collection Time: 10/06/16  7:37 PM  Result Value Ref Range Status   Specimen Description BLOOD RIGHT HAND  Final   Special Requests BOTTLES DRAWN AEROBIC AND ANAEROBIC 10CC  Final   Culture NO GROWTH 3 DAYS  Final   Report Status PENDING  Incomplete  MRSA PCR Screening     Status: None   Collection Time: 10/06/16  7:38 PM  Result Value Ref Range Status   MRSA by PCR NEGATIVE NEGATIVE Final    Comment:        The GeneXpert MRSA Assay (FDA approved for NASAL specimens only), is one component of a comprehensive MRSA colonization surveillance program. It is not intended to diagnose MRSA infection  nor to guide or monitor treatment for MRSA infections.     RADIOLOGY:  No results found.   Management plans discussed with the patient, family and they are in agreement.  CODE STATUS:     Code Status Orders        Start     Ordered   10/06/16 1908  Full code  Continuous     10/06/16 1907    Code Status History    Date Active Date Inactive Code Status Order ID Comments User Context   This patient has a current code status but no historical code status.    Advance Directive Documentation   Flowsheet Row Most Recent Value  Type of Advance Directive  Healthcare Power of Attorney, Living will  Pre-existing out of facility DNR order (yellow form or pink MOST form)  No data  "MOST" Form in Place?  No data      TOTAL TIME TAKING CARE OF THIS PATIENT: 40 minutes.    Fawna Cranmer M.D on 10/10/2016 at 7:44 AM  Between 7am to 6pm - Pager - [phone] After 6pm go to [email or web address] - password [email or web address] [facility] [facility]  Office  [phone]  CC: Primary care physician; Dion Body, MD

## 2016-10-10 NOTE — Care Management (Signed)
SATURATION QUALIFICATIONS: (This note is used to comply with regulatory documentation for home oxygen)  Patient Saturations on Room Air at Rest = 86%  Patient Saturations on Room Air while Ambulating   Patient Saturations on  Liters of oxygen while Ambulating =   Please briefly explain why patient needs home oxygen:hypoxia

## 2016-10-10 NOTE — Care Management Important Message (Signed)
Important Message  Patient Details  Name: Margaret Stevenson MRN: [id number] Date of Birth: [birth year]   Medicare Important Message Given:  Yes    Jolly Mango, RN 10/10/2016, 9:31 AM

## 2016-10-10 NOTE — Discharge Instructions (Signed)
Use your oxygen and nebulizer as per instructions 

## 2016-10-10 NOTE — Progress Notes (Signed)
Patient was discharged home with oxygen tanks and neb machine. Reviewed meds, scripts, and last dose given. Sent room bin meds along with belongings with patient. Allowed time for questions.

## 2016-10-10 NOTE — Progress Notes (Signed)
PT Cancellation Note  Patient Details Name: Margaret Stevenson MRN: [id number] DOB: 25-Oct-1946   Cancelled Treatment:    Reason Eval/Treat Not Completed: Other (comment).  Attempted to see pt but pt appearing lethargic in bed with occasional brief opening of eyes and PT unable to wake pt enough to participate.  Nursing notified.  Will re-attempt PT treatment at a later date/time as able.   Raquel Sarna Kiera Hussey 10/10/2016, 10:01 AM Leitha Bleak, [place]

## 2016-10-10 NOTE — Care Management (Signed)
Requested O2 qualifying sats from primary RN.

## 2016-10-10 NOTE — Progress Notes (Signed)
Discussed with patient and son about the dangers of smoking and oxygen. Patient's son and patient stated they understood.

## 2016-10-11 ENCOUNTER — Emergency Department: Payer: Medicare Other

## 2016-10-11 ENCOUNTER — Encounter: Payer: Self-pay | Admitting: *Deleted

## 2016-10-11 ENCOUNTER — Inpatient Hospital Stay
Admission: EM | Admit: 2016-10-11 | Discharge: 2016-10-18 | DRG: 190 | Disposition: A | Discharge status: Skilled Nursing Facility | Payer: Medicare Other | Attending: Internal Medicine | Admitting: Internal Medicine

## 2016-10-11 DIAGNOSIS — F419 Anxiety disorder, unspecified: Secondary | ICD-10-CM | POA: Diagnosis present

## 2016-10-11 DIAGNOSIS — Z9981 Dependence on supplemental oxygen: Secondary | ICD-10-CM | POA: Diagnosis not present

## 2016-10-11 DIAGNOSIS — J9621 Acute and chronic respiratory failure with hypoxia: Secondary | ICD-10-CM | POA: Diagnosis present

## 2016-10-11 DIAGNOSIS — R739 Hyperglycemia, unspecified: Secondary | ICD-10-CM | POA: Diagnosis present

## 2016-10-11 DIAGNOSIS — F17210 Nicotine dependence, cigarettes, uncomplicated: Secondary | ICD-10-CM | POA: Diagnosis present

## 2016-10-11 DIAGNOSIS — Z803 Family history of malignant neoplasm of breast: Secondary | ICD-10-CM

## 2016-10-11 DIAGNOSIS — Z85820 Personal history of malignant melanoma of skin: Secondary | ICD-10-CM | POA: Diagnosis not present

## 2016-10-11 DIAGNOSIS — J441 Chronic obstructive pulmonary disease with (acute) exacerbation: Secondary | ICD-10-CM | POA: Diagnosis not present

## 2016-10-11 DIAGNOSIS — M6281 Muscle weakness (generalized): Secondary | ICD-10-CM

## 2016-10-11 DIAGNOSIS — Z880 Allergy status to penicillin: Secondary | ICD-10-CM

## 2016-10-11 DIAGNOSIS — R0902 Hypoxemia: Secondary | ICD-10-CM | POA: Diagnosis present

## 2016-10-11 DIAGNOSIS — J449 Chronic obstructive pulmonary disease, unspecified: Secondary | ICD-10-CM | POA: Diagnosis present

## 2016-10-11 DIAGNOSIS — R0602 Shortness of breath: Secondary | ICD-10-CM | POA: Diagnosis not present

## 2016-10-11 LAB — BASIC METABOLIC PANEL
Anion gap: 7 (ref 5–15)
BUN: 16 mg/dL (ref 6–20)
CALCIUM: 8.8 mg/dL — AB (ref 8.9–10.3)
CO2: 38 mmol/L — AB (ref 22–32)
CREATININE: 0.46 mg/dL (ref 0.44–1.00)
Chloride: 92 mmol/L — ABNORMAL LOW (ref 101–111)
Glucose, Bld: 133 mg/dL — ABNORMAL HIGH (ref 65–99)
Potassium: 3.9 mmol/L (ref 3.5–5.1)
SODIUM: 137 mmol/L (ref 135–145)

## 2016-10-11 LAB — CBC
HCT: 51 % — ABNORMAL HIGH (ref 35.0–47.0)
Hemoglobin: 17.5 g/dL — ABNORMAL HIGH (ref 12.0–16.0)
MCH: 32.8 pg (ref 26.0–34.0)
MCHC: 34.3 g/dL (ref 32.0–36.0)
MCV: 95.8 fL (ref 80.0–100.0)
PLATELETS: 207 10*3/uL (ref 150–440)
RBC: 5.33 MIL/uL — AB (ref 3.80–5.20)
RDW: 14.2 % (ref 11.5–14.5)
WBC: 8.7 10*3/uL (ref 3.6–11.0)

## 2016-10-11 LAB — CULTURE, BLOOD (ROUTINE X 2)
CULTURE: NO GROWTH
Culture: NO GROWTH

## 2016-10-11 LAB — TROPONIN I

## 2016-10-11 MED ORDER — METHYLPREDNISOLONE SODIUM SUCC 125 MG IJ SOLR
60.0000 mg | INTRAMUSCULAR | Status: AC
  Administered 2016-10-11: 60 mg via INTRAVENOUS
  Filled 2016-10-11: qty 2

## 2016-10-11 MED ORDER — ACETAMINOPHEN 325 MG PO TABS: 650.0000 mg | ORAL_TABLET | Freq: Four times a day (QID) | ORAL | Status: DC | PRN

## 2016-10-11 MED ORDER — AZITHROMYCIN 500 MG PO TABS
500.0000 mg | ORAL_TABLET | Freq: Every day | ORAL | Status: AC
  Administered 2016-10-12 – 2016-10-16 (×5): 500 mg via ORAL
  Filled 2016-10-11 (×5): qty 1

## 2016-10-11 MED ORDER — SODIUM CHLORIDE 0.9% FLUSH
3.0000 mL | Freq: Two times a day (BID) | INTRAVENOUS | Status: DC
  Administered 2016-10-11 – 2016-10-18 (×11): 3 mL via INTRAVENOUS

## 2016-10-11 MED ORDER — MOMETASONE FURO-FORMOTEROL FUM 200-5 MCG/ACT IN AERO
2.0000 | INHALATION_SPRAY | Freq: Two times a day (BID) | RESPIRATORY_TRACT | Status: DC
  Administered 2016-10-11 – 2016-10-18 (×14): 2 via RESPIRATORY_TRACT
  Filled 2016-10-11: qty 8.8

## 2016-10-11 MED ORDER — IPRATROPIUM-ALBUTEROL 0.5-2.5 (3) MG/3ML IN SOLN
3.0000 mL | Freq: Once | RESPIRATORY_TRACT | Status: AC
  Administered 2016-10-11: 3 mL via RESPIRATORY_TRACT
  Filled 2016-10-11: qty 3

## 2016-10-11 MED ORDER — ENOXAPARIN SODIUM 40 MG/0.4ML ~~LOC~~ SOLN
40.0000 mg | SUBCUTANEOUS | Status: DC
  Administered 2016-10-12 – 2016-10-17 (×5): 40 mg via SUBCUTANEOUS
  Filled 2016-10-11 (×6): qty 0.4

## 2016-10-11 MED ORDER — TIOTROPIUM BROMIDE MONOHYDRATE 18 MCG IN CAPS
18.0000 ug | ORAL_CAPSULE | Freq: Every day | RESPIRATORY_TRACT | Status: DC
  Administered 2016-10-11 – 2016-10-17 (×7): 18 ug via RESPIRATORY_TRACT
  Filled 2016-10-11 (×2): qty 5

## 2016-10-11 MED ORDER — ONDANSETRON HCL 4 MG/2ML IJ SOLN: 4.0000 mg | Freq: Four times a day (QID) | INTRAMUSCULAR | Status: DC | PRN

## 2016-10-11 MED ORDER — IPRATROPIUM-ALBUTEROL 0.5-2.5 (3) MG/3ML IN SOLN
3.0000 mL | RESPIRATORY_TRACT | Status: DC | PRN
  Administered 2016-10-12: 3 mL via RESPIRATORY_TRACT
  Filled 2016-10-11: qty 3

## 2016-10-11 MED ORDER — ALPRAZOLAM 1 MG PO TABS
1.0000 mg | ORAL_TABLET | Freq: Two times a day (BID) | ORAL | Status: DC | PRN
  Administered 2016-10-11 – 2016-10-13 (×4): 1 mg via ORAL
  Filled 2016-10-11 (×4): qty 1

## 2016-10-11 MED ORDER — INSULIN ASPART 100 UNIT/ML ~~LOC~~ SOLN
0.0000 [IU] | Freq: Three times a day (TID) | SUBCUTANEOUS | Status: DC
  Administered 2016-10-12 (×2): 2 [IU] via SUBCUTANEOUS
  Administered 2016-10-12: 1 [IU] via SUBCUTANEOUS
  Administered 2016-10-13: 2 [IU] via SUBCUTANEOUS
  Administered 2016-10-13: 1 [IU] via SUBCUTANEOUS
  Administered 2016-10-13 – 2016-10-14 (×3): 2 [IU] via SUBCUTANEOUS
  Administered 2016-10-15: 1 [IU] via SUBCUTANEOUS
  Administered 2016-10-15: 2 [IU] via SUBCUTANEOUS
  Administered 2016-10-16 (×2): 1 [IU] via SUBCUTANEOUS
  Administered 2016-10-17 (×2): 2 [IU] via SUBCUTANEOUS
  Filled 2016-10-11 (×2): qty 2
  Filled 2016-10-11 (×2): qty 1
  Filled 2016-10-11 (×3): qty 2
  Filled 2016-10-11 (×2): qty 1
  Filled 2016-10-11 (×3): qty 2

## 2016-10-11 MED ORDER — ACETAMINOPHEN 650 MG RE SUPP: 650.0000 mg | Freq: Four times a day (QID) | RECTAL | Status: DC | PRN

## 2016-10-11 MED ORDER — METHYLPREDNISOLONE SODIUM SUCC 125 MG IJ SOLR
60.0000 mg | Freq: Four times a day (QID) | INTRAMUSCULAR | Status: DC
  Administered 2016-10-12 – 2016-10-13 (×6): 60 mg via INTRAVENOUS
  Filled 2016-10-11 (×6): qty 2

## 2016-10-11 MED ORDER — INSULIN ASPART 100 UNIT/ML ~~LOC~~ SOLN: 0.0000 [IU] | Freq: Every day | SUBCUTANEOUS | Status: DC

## 2016-10-11 MED ORDER — ALBUTEROL SULFATE (2.5 MG/3ML) 0.083% IN NEBU
2.5000 mg | INHALATION_SOLUTION | RESPIRATORY_TRACT | Status: AC
  Administered 2016-10-11: 2.5 mg via RESPIRATORY_TRACT
  Filled 2016-10-11: qty 3

## 2016-10-11 MED ORDER — SODIUM CHLORIDE 0.9 % IV BOLUS (SEPSIS)
500.0000 mL | Freq: Once | INTRAVENOUS | Status: AC
  Administered 2016-10-11: 500 mL via INTRAVENOUS

## 2016-10-11 MED ORDER — ONDANSETRON HCL 4 MG PO TABS: 4.0000 mg | ORAL_TABLET | Freq: Four times a day (QID) | ORAL | Status: DC | PRN

## 2016-10-11 MED ORDER — BENZONATATE 100 MG PO CAPS
100.0000 mg | ORAL_CAPSULE | Freq: Two times a day (BID) | ORAL | Status: DC
  Administered 2016-10-11 – 2016-10-18 (×13): 100 mg via ORAL
  Filled 2016-10-11 (×14): qty 1

## 2016-10-11 MED ORDER — MONTELUKAST SODIUM 10 MG PO TABS
10.0000 mg | ORAL_TABLET | Freq: Every day | ORAL | Status: DC
  Administered 2016-10-11 – 2016-10-17 (×7): 10 mg via ORAL
  Filled 2016-10-11 (×7): qty 1

## 2016-10-11 NOTE — ED Notes (Signed)
Patient with complaints of pain in the IV site to her LW. Site is asymptomatic; no swelling or redness noted; brisk blood return noted with aspiration. Patient states, "They didn't do a good job putting it in. I told them that, but they didn't listen." RN offered to discontinue site and restart; patient refused.

## 2016-10-11 NOTE — ED Notes (Signed)
Patient continues to complain about PIV; site reassessed and redressed at this time; site leaking. No redness; brisk blood return; no swelling. Patient once again asked if she would like for the RN to move the site; refused citing that she did not wish to be stuck again.

## 2016-10-11 NOTE — ED Notes (Signed)
Pt states she was recently admitted to the hospital with pneumonia, discharged yesterday, pt states she saw her PCP this AM and was sent to ED for evaluation after he felt she wasn't getting any better, pt has hx of COPD and emphysema, pt on 4L [place] at home, pt states she has been unable to eat or feeling week, states normal 02 levels at home are 88% for her, son at bedside, pt awake and alert in no acute distress

## 2016-10-11 NOTE — ED Notes (Signed)
Rn called to the room in response to patient reporting that she cannot breathe. RN assessed patient; found to be oxygenating well; SPO2 is 95% on the supplemental oxygen that is in place. Patient asking son to go home and get her own medication because "no body is doing anything". Son became upset and states that they have not seen a MD since being here; being in the room 4 hours per their report.  RN advised that he was just walking in and getting report. Will check with MD. Margaret Stevenson with Margaret Code, MD - MD to bedside at this time,

## 2016-10-11 NOTE — ED Provider Notes (Signed)
[facility] Emergency Department Provider Note   ____________________________________________   First MD Initiated Contact with Patient 10/11/16 1915     (approximate)  I have reviewed the triage vital signs and the nursing notes.   HISTORY  Chief Complaint Shortness of Breath    HPI Margaret Stevenson is a 70 y.o. female here for cough, shortness of breath, wheezing which is been not improving and reports she's had pneumonia, but she continues to have wheezing and shortness of breath after leaving the hospital yesterday. She is been taking prednisone, Levaquin, and nebulizers at home but reports she feels very short of breath to the point she cannot walk without assistance at home which is very unusual.    Past Medical History:  Diagnosis Date  . Cancer ([place])    melanoma 07/2013  . COPD (chronic obstructive pulmonary disease) [facility])     Patient Active Problem List   Diagnosis Date Noted  . Acute respiratory failure ([place]) 10/06/2016  . COPD exacerbation ([place]) 10/06/2016  . CAP (community acquired pneumonia) 10/06/2016  . Hyperglycemia 10/06/2016    History reviewed. No pertinent surgical history.  Prior to Admission medications   Medication Sig Start Date End Date Taking? Authorizing Provider  albuterol (VENTOLIN HFA) 108 (90 Base) MCG/ACT inhaler Inhale 2 puffs into the lungs every 4 (four) hours as needed.  08/27/16 08/27/17  Historical Provider, MD  ALPRAZolam Duanne Moron) 1 MG tablet Take 1 tablet (1 mg total) by mouth 2 (two) times daily as needed for anxiety. 10/10/16   Fritzi Mandes, MD  benzonatate (TESSALON) 100 MG capsule Take 100 mg by mouth 2 (two) times daily.  10/05/16 10/15/16  Historical Provider, MD  etodolac (LODINE) 500 MG tablet Take 500 mg by mouth daily as needed.  06/26/16 06/26/17  Historical Provider, MD  guaiFENesin (MUCINEX) 600 MG 12 hr tablet Take 1 tablet (600 mg total) by mouth 2 (two) times daily as needed. 10/10/16   Fritzi Mandes,  MD  ipratropium-albuterol (DUONEB) 0.5-2.5 (3) MG/3ML SOLN Take 3 mLs by nebulization every 4 (four) hours. 10/10/16   Fritzi Mandes, MD  levofloxacin (LEVAQUIN) 500 MG tablet Take by mouth daily.  10/05/16 10/12/16  Historical Provider, MD  mometasone-formoterol (DULERA) 200-5 MCG/ACT AERO Inhale 2 puffs into the lungs 2 (two) times daily. 10/10/16   Fritzi Mandes, MD  montelukast (SINGULAIR) 10 MG tablet Take 1 tablet (10 mg total) by mouth at bedtime. 10/10/16   Fritzi Mandes, MD  predniSONE (DELTASONE) 10 MG tablet Take 6 tablets (60 mg total) by mouth daily with breakfast. Taper by 10 mg daily then stop 10/10/16   Fritzi Mandes, MD  tiotropium (SPIRIVA) 18 MCG inhalation capsule Place 18 mcg into inhaler and inhale at bedtime.  08/27/16 08/27/17  Historical Provider, MD    Allergies Penicillins  Family History  Problem Relation Age of Onset  . Breast cancer Mother 45    Social History Social History  Substance Use Topics  . Smoking status: Current Every Day Smoker    Packs/day: 2.00    Types: Cigarettes  . Smokeless tobacco: Never Used  . Alcohol use No    Review of Systems Constitutional: No fever/chills. Fevers and chills have improved Eyes: No visual changes. ENT: No sore throat. Cardiovascular: Denies chest pain. Respiratory: See history of present illness Gastrointestinal: No abdominal pain.  No nausea, no vomiting.  No diarrhea.  No constipation. Genitourinary: Negative for dysuria. Musculoskeletal: Negative for back pain. Skin: Negative for rash. Neurological: Negative for headaches,  focal weakness or numbness.  10-point ROS otherwise negative.  ____________________________________________   PHYSICAL EXAM:  VITAL SIGNS: ED Triage Vitals  Enc Vitals Group     BP 10/11/16 1529 (!) 160/83     Pulse Rate 10/11/16 1529 (!) 102     Resp 10/11/16 1529 (!) 22     Temp 10/11/16 1529 97.7 F (36.5 C)     Temp Source 10/11/16 1529 Oral     SpO2 10/11/16 1529 91 %     Weight  10/11/16 1530 142 lb (64.4 kg)     Height 10/11/16 1530 5\' 7"  (1.702 m)     Head Circumference --      Peak Flow --      Pain Score --      Pain Loc --      Pain Edu? --      Excl. in [place]? --     Constitutional: Alert and oriented. Minimally tachypnea, no acute extremities but does appear mildly short of breath Eyes: Conjunctivae are normal. PERRL. EOMI. Head: Atraumatic. Nose: No congestion/rhinnorhea. Mouth/Throat: Mucous membranes are moist.  Oropharynx non-erythematous. Neck: No stridor.   Cardiovascular: Normal rate, regular rhythm. Grossly normal heart sounds.  Good peripheral circulation. Respiratory: Diminished in the bases bilateral. Speaks in short phrases. Minimal use of accessory muscles. Mild end expiratory wheezing bilateral. No focal rales. Gastrointestinal: Soft and nontender. No distention. No abdominal bruits. No CVA tenderness. Musculoskeletal: No lower extremity tenderness nor edema.  No joint effusions. Neurologic:  Normal speech and language. No gross focal neurologic deficits are appreciated.  Skin:  Skin is warm, dry and intact. No rash noted. Psychiatric: Mood and affect are normal. Speech and behavior are normal.  ____________________________________________   LABS (all labs ordered are listed, but only abnormal results are displayed)  Labs Reviewed  BASIC METABOLIC PANEL - Abnormal; Notable for the following:       Result Value   Chloride 92 (*)    CO2 38 (*)    Glucose, Bld 133 (*)    Calcium 8.8 (*)    All other components within normal limits  CBC - Abnormal; Notable for the following:    RBC 5.33 (*)    Hemoglobin 17.5 (*)    HCT 51.0 (*)    All other components within normal limits  TROPONIN I   ____________________________________________  EKG  ED ECG REPORT I, Starnisha Batrez, the attending physician, personally viewed and interpreted this ECG.  Date: 10/11/2016 EKG Time: 1535 Rate: 95 Rhythm: normal sinus rhythm QRS Axis:  normal Intervals: normal ST/T Wave abnormalities: normal Conduction Disturbances: none Narrative Interpretation: unremarkable except probable atrial enlargement  ____________________________________________  RADIOLOGY  Dg Chest 2 View  Result Date: 10/11/2016 CLINICAL DATA:  70 year old female with shortness of Breath for 6 days. Initial encounter. EXAM: CHEST  2 VIEW COMPARISON:  Portable chest 10/06/2016. FINDINGS: Large lung volumes. Basilar predominant increased interstitial markings in both lungs appear chronic. No pneumothorax, pulmonary edema, pleural effusion or consolidation. Calcified granuloma in the left mid lung. Normal cardiac size and mediastinal contours. Calcified aortic atherosclerosis. Visualized tracheal air column is within normal limits. Osteopenia. No acute osseous abnormality identified. IMPRESSION: 1. Chronic lung disease with hyperinflation. No acute cardiopulmonary abnormality. 2.  Calcified aortic atherosclerosis. Electronically Signed   By: Genevie Ann M.D.   On: 10/11/2016 16:14    ____________________________________________   PROCEDURES  Procedure(s) performed: None  Procedures  Critical Care performed: Yes, see critical care note(s) CRITICAL CARE Performed by: Delman Kitten  Total critical care time: 35 minutes  Critical care time was exclusive of separately billable procedures and treating other patients.  Critical care was necessary to treat or prevent imminent or life-threatening deterioration.  Critical care was time spent personally by me on the following activities: development of treatment plan with patient and/or surrogate as well as nursing, discussions with consultants, evaluation of patient's response to treatment, examination of patient, obtaining history from patient or surrogate, ordering and performing treatments and interventions, ordering and review of laboratory studies, ordering and review of radiographic studies, pulse oximetry and  re-evaluation of patient's condition.  ____________________________________________   INITIAL IMPRESSION / ASSESSMENT AND PLAN / ED COURSE  Pertinent labs & imaging results that were available during my care of the patient were reviewed by me and considered in my medical decision making (see chart for details).  Ongoing cough, shortness of breath and wheezing. White blood cell count improved, afebrile, and chest x-ray also appearing improved clinically. I suspect her pneumonia is likely being treated well, but she has ongoing symptoms consistent with likely COPD or bronchospasm. She has mild increased work of breathing, and we will treat her with additional Solu-Medrol as well as nebulizers here. She reports taking prednisone and Levaquin already today. No evidence of acute cardiac abnormality. No pleuritic chest pain. No leg swelling, no overt evidence to support a diagnosis of DVT and she reports her symptoms have not improved since leaving the hospital. I do not feel this is due to pulmonary embolus, pneumothorax, dissection suspect likely due to COPD or reactive airway disease type exacerbation which is ongoing.  Clinical Course   ----------------------------------------- 8:59 PM on 10/11/2016 -----------------------------------------  Patient had desaturation to 77% on 4 L. Increased to nonrebreather with improvement to low 90s saturation, continues to feel shortness of breath. After placing the patient on Ventimask at 40%, her oxygen saturations improved to the low 90s and she reports she feels much improved. She does report that this desaturation occurred just after a coughing fit, and she is been feeling very short of breath at home especially after these types of episodes. We'll give additional nebulizers at this time, patient agreeable with the plan for admission given the associated hypoxia and ongoing dyspnea with little improvement, and probable some worsening on home.   Case  discussed with Dr. Jannifer Franklin ____________________________________________   FINAL CLINICAL IMPRESSION(S) / ED DIAGNOSES  Final diagnoses:  COPD exacerbation ([place])      NEW MEDICATIONS STARTED DURING THIS VISIT:  New Prescriptions   No medications on file     Note:  This document was prepared using Dragon voice recognition software and may include unintentional dictation errors.     Delman Kitten, MD 10/11/16 2114

## 2016-10-11 NOTE — ED Notes (Signed)
SVN treatments completed. Patient reports that she is feeling better, but her breathing is "still rough". SPO2 99% at this time. Patient's son returns to the room at this time with fast food for the patient. MD aware.

## 2016-10-11 NOTE — ED Triage Notes (Signed)
Pt was discharged from an inpatient stay with pneumonia , pt saw PCP today and told pt to go to ER for admission, pt is O2 dependant at 4L via [place], pt reports increased fatigue

## 2016-10-11 NOTE — ED Notes (Signed)
MD out of room for orders for SVN treatments x 2 and IV SoluMedrol. Orders to be carried by this RN.

## 2016-10-11 NOTE — ED Notes (Signed)
Patient appears to have improved from a respiratory standpoint following the SVN treatments. Patient continues to report that she cannot breathe well. SPO2 97% on the venturi mask; patient with productive cough noted; swallowing sputum. Patient pending transfer to inpatient unit.

## 2016-10-11 NOTE — ED Notes (Signed)
Pt's son at nurse's station, states pt had coughing fit, o2sat down to 77%, [place] turned to 6L but pt states unable to breathe through nose so NRB at 10L applied, waited until pt sat up to 90% then this nurse left bedside informed Dr. Jacqualine Code and primary nurse

## 2016-10-11 NOTE — ED Notes (Signed)
Patient pending admission. Resting comfortably in bed at this time. Supplemental oxygen via venturi mask. Will continue to monitor.

## 2016-10-11 NOTE — H&P (Signed)
[place] [facility] [place] NAME: Margaret Stevenson    MR#:  [id number]  DATE OF BIRTH:  1946/03/05  DATE OF ADMISSION:  10/11/2016  PRIMARY CARE PHYSICIAN: Dion Body, MD   REQUESTING/REFERRING PHYSICIAN: Jacqualine Code, MD  CHIEF COMPLAINT:   Chief Complaint  Patient presents with  . Shortness of Breath    HISTORY OF PRESENT ILLNESS:  Margaret Stevenson  is a 70 y.o. female who presents with Persistent and worsening shortness of breath. Patient was just admitted here with pneumonia and was discharged yesterday. However, she comes back today she has had significant the setting episodes at home. She has severe COPD at baseline and is on 4 L of oxygen at home. Despite this she just has had progressively worsening shortness of breath for the last 24-36 hours. Here in the ED she is on 4 L of oxygen and when she coughs still desat to the 70s. She was treated upfront for COPD exacerbation and hospitals were called for admission.  PAST MEDICAL HISTORY:   Past Medical History:  Diagnosis Date  . Cancer ([place])    melanoma 07/2013  . COPD (chronic obstructive pulmonary disease) ([place])     PAST SURGICAL HISTORY:   Past Surgical History:  Procedure Laterality Date  . TONSILLECTOMY      SOCIAL HISTORY:   Social History  Substance Use Topics  . Smoking status: Current Every Day Smoker    Packs/day: 2.00    Types: Cigarettes  . Smokeless tobacco: Never Used  . Alcohol use No    FAMILY HISTORY:   Family History  Problem Relation Age of Onset  . Breast cancer Mother 21    DRUG ALLERGIES:   Allergies  Allergen Reactions  . Penicillins Hives    Has patient had a PCN reaction causing immediate rash, facial/tongue/throat swelling, SOB or lightheadedness with hypotension: no Has patient had a PCN reaction causing severe rash involving mucus membranes or skin necrosis: no Has patient had a PCN reaction that required hospitalization no Has  patient had a PCN reaction occurring within the last 10 years: no If all of the above answers are "NO", then may proceed with Cephalosporin use.     MEDICATIONS AT HOME:   Prior to Admission medications   Medication Sig Start Date End Date Taking? Authorizing Provider  albuterol (VENTOLIN HFA) 108 (90 Base) MCG/ACT inhaler Inhale 2 puffs into the lungs every 4 (four) hours as needed.  08/27/16 08/27/17 Yes Historical Provider, MD  ALPRAZolam Duanne Moron) 1 MG tablet Take 1 tablet (1 mg total) by mouth 2 (two) times daily as needed for anxiety. 10/10/16  Yes Fritzi Mandes, MD  benzonatate (TESSALON) 100 MG capsule Take 100 mg by mouth 2 (two) times daily.  10/05/16 10/15/16 Yes Historical Provider, MD  etodolac (LODINE) 500 MG tablet Take 500 mg by mouth daily as needed.  06/26/16 06/26/17 Yes Historical Provider, MD  guaiFENesin (MUCINEX) 600 MG 12 hr tablet Take 1 tablet (600 mg total) by mouth 2 (two) times daily as needed. 10/10/16  Yes Fritzi Mandes, MD  ipratropium-albuterol (DUONEB) 0.5-2.5 (3) MG/3ML SOLN Take 3 mLs by nebulization every 4 (four) hours. 10/10/16  Yes Fritzi Mandes, MD  levofloxacin (LEVAQUIN) 500 MG tablet Take by mouth daily.  10/05/16 10/12/16 Yes Historical Provider, MD  mometasone-formoterol (DULERA) 200-5 MCG/ACT AERO Inhale 2 puffs into the lungs 2 (two) times daily. 10/10/16  Yes Fritzi Mandes, MD  montelukast (SINGULAIR) 10 MG tablet Take 1  tablet (10 mg total) by mouth at bedtime. 10/10/16  Yes Fritzi Mandes, MD  predniSONE (DELTASONE) 10 MG tablet Take 6 tablets (60 mg total) by mouth daily with breakfast. Taper by 10 mg daily then stop 10/10/16  Yes Fritzi Mandes, MD  tiotropium (SPIRIVA) 18 MCG inhalation capsule Place 18 mcg into inhaler and inhale at bedtime.  08/27/16 08/27/17 Yes Historical Provider, MD    REVIEW OF SYSTEMS:  Review of Systems  Constitutional: Positive for malaise/fatigue. Negative for chills, fever and weight loss.  HENT: Negative for ear pain, hearing loss and tinnitus.    Eyes: Negative for blurred vision, double vision, pain and redness.  Respiratory: Positive for cough, sputum production and shortness of breath. Negative for hemoptysis.   Cardiovascular: Negative for chest pain, palpitations, orthopnea and leg swelling.  Gastrointestinal: Negative for abdominal pain, constipation, diarrhea, nausea and vomiting.  Genitourinary: Negative for dysuria, frequency and hematuria.  Musculoskeletal: Negative for back pain, joint pain and neck pain.  Skin:       No acne, rash, or lesions  Neurological: Negative for dizziness, tremors, focal weakness and weakness.  Endo/Heme/Allergies: Negative for polydipsia. Does not bruise/bleed easily.  Psychiatric/Behavioral: Negative for depression. The patient is not nervous/anxious and does not have insomnia.      VITAL SIGNS:   Vitals:   10/11/16 1529 10/11/16 1530 10/11/16 1830  BP: (!) 160/83    Pulse: (!) 102  96  Resp: (!) 22  (!) 21  Temp: 97.7 F (36.5 C)    TempSrc: Oral    SpO2: 91%  94%  Weight:  64.4 kg (142 lb)   Height:  5\' 7"  (1.702 m)    Wt Readings from Last 3 Encounters:  10/11/16 64.4 kg (142 lb)  10/10/16 64.5 kg (142 lb 1.6 oz)    PHYSICAL EXAMINATION:  Physical Exam  Vitals reviewed. Constitutional: She is oriented to person, place, and time. She appears well-developed and well-nourished. No distress.  HENT:  Head: Normocephalic and atraumatic.  Mouth/Throat: Oropharynx is clear and moist.  Eyes: Conjunctivae and EOM are normal. Pupils are equal, round, and reactive to light. No scleral icterus.  Neck: Normal range of motion. Neck supple. No JVD present. No thyromegaly present.  Cardiovascular: Normal rate, regular rhythm and intact distal pulses.  Exam reveals no gallop and no friction rub.   No murmur heard. Respiratory: She is in respiratory distress (mild). She has wheezes. She has no rales.  Poor air movement bilaterally  GI: Soft. Bowel sounds are normal. She exhibits no  distension. There is no tenderness.  Musculoskeletal: Normal range of motion. She exhibits no edema.  No arthritis, no gout  Lymphadenopathy:    She has no cervical adenopathy.  Neurological: She is alert and oriented to person, place, and time. No cranial nerve deficit.  No dysarthria, no aphasia  Skin: Skin is warm and dry. No rash noted. No erythema.  Psychiatric: She has a normal mood and affect. Her behavior is normal. Judgment and thought content normal.    LABORATORY PANEL:   CBC  Recent Labs Lab 10/11/16 1534  WBC 8.7  HGB 17.5*  HCT 51.0*  PLT 207   ------------------------------------------------------------------------------------------------------------------  Chemistries   Recent Labs Lab 10/07/16 0322 10/11/16 1534  NA 136 137  K 4.7 3.9  CL 99* 92*  CO2 32 38*  GLUCOSE 151* 133*  BUN 12 16  CREATININE 0.62 0.46  CALCIUM 9.3 8.8*  AST 21  --   ALT 15  --  ALKPHOS 25  --   BILITOT 0.5  --    ------------------------------------------------------------------------------------------------------------------  Cardiac Enzymes  Recent Labs Lab 10/11/16 1534  TROPONINI <0.03   ------------------------------------------------------------------------------------------------------------------  RADIOLOGY:  Dg Chest 2 View  Result Date: 10/11/2016 CLINICAL DATA:  70 year old female with shortness of Breath for 6 days. Initial encounter. EXAM: CHEST  2 VIEW COMPARISON:  Portable chest 10/06/2016. FINDINGS: Large lung volumes. Basilar predominant increased interstitial markings in both lungs appear chronic. No pneumothorax, pulmonary edema, pleural effusion or consolidation. Calcified granuloma in the left mid lung. Normal cardiac size and mediastinal contours. Calcified aortic atherosclerosis. Visualized tracheal air column is within normal limits. Osteopenia. No acute osseous abnormality identified. IMPRESSION: 1. Chronic lung disease with hyperinflation.  No acute cardiopulmonary abnormality. 2.  Calcified aortic atherosclerosis. Electronically Signed   By: Genevie Ann M.D.   On: 10/11/2016 16:14    EKG:   Orders placed or performed during the hospital encounter of 10/11/16  . ED EKG within 10 minutes  . ED EKG within 10 minutes  . EKG 12-Lead  . EKG 12-Lead    IMPRESSION AND PLAN:  Principal Problem:   COPD exacerbation ([place]) - likely from residual inflammation after treatment of pneumonia. Pneumonia seems to have resolved well with radiographic imaging tonight showing no evidence of the same and white count are normal level. IV steroids, azithromycin, nebs when necessary, Tessalon for cough. Active Problems:   Hypoxia - especially desats when she coughs. We will keep her on supplemental oxygen at 4 L for now, via mask as she states that she is having a hard time breathing through her nose at this time. Goal saturations between 88 and 92%   Hyperglycemia - sliding scale insulin and glucose checks   Anxiety - continue home meds  All the records are reviewed and case discussed with ED provider. Management plans discussed with the patient and/or family.  DVT PROPHYLAXIS: SubQ lovenox  GI PROPHYLAXIS: None  ADMISSION STATUS: Inpatient  CODE STATUS: Full Code Status History    Date Active Date Inactive Code Status Order ID Comments User Context   10/06/2016  7:07 PM 10/10/2016  3:02 PM Full Code [id number]  Idelle Crouch, MD ED    Advance Directive Documentation   Flowsheet Row Most Recent Value  Type of Advance Directive  Healthcare Power of Attorney, Living will  Pre-existing out of facility DNR order (yellow form or pink MOST form)  No data  "MOST" Form in Place?  No data      TOTAL TIME TAKING CARE OF THIS PATIENT: 45 minutes.    Aleida Crandell FIELDING 10/11/2016, 9:27 PM  [facility]  Office  [phone]  CC: Primary care physician; Dion Body, MD

## 2016-10-11 NOTE — ED Notes (Signed)
MD to bedside for patient reassessment. MD asking that NRB be removed as placed by Hassell Done, Therapist, sports. Venturi mask placed at this time; patient maintains sats at 96% SPO2. MD discussing POC with patient.

## 2016-10-12 DIAGNOSIS — J441 Chronic obstructive pulmonary disease with (acute) exacerbation: Principal | ICD-10-CM

## 2016-10-12 LAB — GLUCOSE, CAPILLARY
Glucose-Capillary: 125 mg/dL — ABNORMAL HIGH (ref 65–99)
Glucose-Capillary: 127 mg/dL — ABNORMAL HIGH (ref 65–99)
Glucose-Capillary: 138 mg/dL — ABNORMAL HIGH (ref 65–99)
Glucose-Capillary: 151 mg/dL — ABNORMAL HIGH (ref 65–99)
Glucose-Capillary: 169 mg/dL — ABNORMAL HIGH (ref 65–99)
Glucose-Capillary: 182 mg/dL — ABNORMAL HIGH (ref 65–99)

## 2016-10-12 LAB — BASIC METABOLIC PANEL WITH GFR
Anion gap: 5 (ref 5–15)
BUN: 14 mg/dL (ref 6–20)
CO2: 39 mmol/L — ABNORMAL HIGH (ref 22–32)
Calcium: 8.5 mg/dL — ABNORMAL LOW (ref 8.9–10.3)
Chloride: 95 mmol/L — ABNORMAL LOW (ref 101–111)
Creatinine, Ser: 0.49 mg/dL (ref 0.44–1.00)
GFR calc Af Amer: >60 mL/min (ref >60)
GFR calc non Af Amer: >60 mL/min (ref >60)
Glucose, Bld: 136 mg/dL — ABNORMAL HIGH (ref 65–99)
Potassium: 4 mmol/L (ref 3.5–5.1)
Sodium: 139 mmol/L (ref 135–145)

## 2016-10-12 LAB — CBC
HEMATOCRIT: 47.9 % — AB (ref 35.0–47.0)
Hemoglobin: 16.2 g/dL — ABNORMAL HIGH (ref 12.0–16.0)
MCH: 32.6 pg (ref 26.0–34.0)
MCHC: 33.8 g/dL (ref 32.0–36.0)
MCV: 96.4 fL (ref 80.0–100.0)
Platelets: 186 10*3/uL (ref 150–440)
RBC: 4.97 MIL/uL (ref 3.80–5.20)
RDW: 14 % (ref 11.5–14.5)
WBC: 7.8 10*3/uL (ref 3.6–11.0)

## 2016-10-12 MED ORDER — IPRATROPIUM-ALBUTEROL 0.5-2.5 (3) MG/3ML IN SOLN
3.0000 mL | RESPIRATORY_TRACT | Status: DC
  Administered 2016-10-12 – 2016-10-15 (×20): 3 mL via RESPIRATORY_TRACT
  Filled 2016-10-12 (×19): qty 3

## 2016-10-12 MED ORDER — GUAIFENESIN-DM 100-10 MG/5ML PO SYRP: 5.0000 mL | ORAL_SOLUTION | ORAL | Status: DC | PRN

## 2016-10-12 NOTE — Consult Note (Signed)
Name: Margaret Stevenson MRN: [id number] DOB: [birth year]    ADMISSION DATE:  10/11/2016 CONSULTATION DATE:  10/12/2016  REFERRING MD :  Dr. Bridgett Larsson  CHIEF COMPLAINT:  Shortness of Breath  BRIEF PATIENT DESCRIPTION:  This is a 70 yo female with a PMH of COPD, Melanoma (07/2013), and Current every day smoker (2 PPD for 29yrs).  She presented to [facility] ER on 11/3 with c/o worsening shortness of breath.  She was recently admitted due to pneumonia and discharged home on 11/2.  She has severe COPD at baseline and upon discharge she was on 4L O2 via nasal canula at home.  Despite wearing her baseline O2 she has had worsening shortness of breath per ER notes.  In the ED she was on 4L O2 via nasal canula, however when she coughed her O2 sats decreased to the 70's.  PCCM consulted 11/4 for management of acute on chronic hypoxic respiratory failure secondary to AECOPD.  Her outpatient pulmonologist is Dr. Leane Platt.  SIGNIFICANT EVENTS  11/3-Pt admitted to [facility] due to acute on chronic hypoxic respiratory failure secondary to AECOPD 11/4-PCCM consulted   STUDIES:  None  HISTORY OF PRESENT ILLNESS:   This is a 70 yo female who was recently admitted due to pneumonia and discharged on 11/2 who presented to [facility] ER on 11/3 due to worsening shortness of breath despite being on 4L O2 via nasal canula in the ER her O2 sats decreased to 70% with coughing secondary to AECOPD.  PAST MEDICAL HISTORY :   has a past medical history of Cancer ([place]) and COPD (chronic obstructive pulmonary disease) ([place]).  has a past surgical history that includes Tonsillectomy. Prior to Admission medications   Medication Sig Start Date End Date Taking? Authorizing Provider  albuterol (VENTOLIN HFA) 108 (90 Base) MCG/ACT inhaler Inhale 2 puffs into the lungs every 4 (four) hours as needed.  08/27/16 08/27/17 Yes Historical Provider, MD  ALPRAZolam Duanne Moron) 1 MG tablet Take 1 tablet (1 mg total) by mouth 2 (two) times daily as needed for  anxiety. 10/10/16  Yes Fritzi Mandes, MD  benzonatate (TESSALON) 100 MG capsule Take 100 mg by mouth 2 (two) times daily.  10/05/16 10/15/16 Yes Historical Provider, MD  etodolac (LODINE) 500 MG tablet Take 500 mg by mouth daily as needed.  06/26/16 06/26/17 Yes Historical Provider, MD  guaiFENesin (MUCINEX) 600 MG 12 hr tablet Take 1 tablet (600 mg total) by mouth 2 (two) times daily as needed. 10/10/16  Yes Fritzi Mandes, MD  ipratropium-albuterol (DUONEB) 0.5-2.5 (3) MG/3ML SOLN Take 3 mLs by nebulization every 4 (four) hours. 10/10/16  Yes Fritzi Mandes, MD  levofloxacin (LEVAQUIN) 500 MG tablet Take by mouth daily.  10/05/16 10/12/16 Yes Historical Provider, MD  mometasone-formoterol (DULERA) 200-5 MCG/ACT AERO Inhale 2 puffs into the lungs 2 (two) times daily. 10/10/16  Yes Fritzi Mandes, MD  montelukast (SINGULAIR) 10 MG tablet Take 1 tablet (10 mg total) by mouth at bedtime. 10/10/16  Yes Fritzi Mandes, MD  predniSONE (DELTASONE) 10 MG tablet Take 6 tablets (60 mg total) by mouth daily with breakfast. Taper by 10 mg daily then stop 10/10/16  Yes Fritzi Mandes, MD  tiotropium (SPIRIVA) 18 MCG inhalation capsule Place 18 mcg into inhaler and inhale at bedtime.  08/27/16 08/27/17 Yes Historical Provider, MD   Allergies  Allergen Reactions  . Penicillins Hives    Has patient had a PCN reaction causing immediate rash, facial/tongue/throat swelling, SOB or lightheadedness with hypotension: no Has patient had a PCN  reaction causing severe rash involving mucus membranes or skin necrosis: no Has patient had a PCN reaction that required hospitalization no Has patient had a PCN reaction occurring within the last 10 years: no If all of the above answers are "NO", then may proceed with Cephalosporin use.     FAMILY HISTORY:  family history includes Breast cancer (age of onset: 22) in her mother. SOCIAL HISTORY:  reports that she has been smoking Cigarettes.  She has been smoking about 2.00 packs per day. She has never used  smokeless tobacco. She reports that she does not drink alcohol or use drugs.  REVIEW OF SYSTEMS:  Positives in BOLD Constitutional: Negative for fever, chills, weight loss, malaise/fatigue and diaphoresis.  HENT: Negative for hearing loss, ear pain, nosebleeds, congestion, sore throat, neck pain, tinnitus and ear discharge.   Eyes: Negative for blurred vision, double vision, photophobia, pain, discharge and redness.  Respiratory: cough, hemoptysis, sputum production, shortness of breath, wheezing and stridor.   Cardiovascular: Negative for chest pain, palpitations, orthopnea, claudication, leg swelling and PND.  Gastrointestinal: Negative for heartburn, nausea, vomiting, abdominal pain, diarrhea, constipation, blood in stool and melena.  Genitourinary: Negative for dysuria, urgency, frequency, hematuria and flank pain.  Musculoskeletal: Negative for myalgias, back pain, joint pain and falls.  Skin: Negative for itching and rash.  Neurological: Negative for dizziness, tingling, tremors, sensory change, speech change, focal weakness, seizures, loss of consciousness, weakness and headaches.  Endo/Heme/Allergies: Negative for environmental allergies and polydipsia. Does not bruise/bleed easily.  SUBJECTIVE:  She states she is still slightly short of breath with coughing.  She is currently on 45% venturi mask with no s/sx of respiratory distress  VITAL SIGNS: Temp:  [97 F (36.1 C)-98.5 F (36.9 C)] 98.4 F (36.9 C) (11/04 1954) Pulse Rate:  [89-114] 114 (11/04 1954) Resp:  [16-24] 20 (11/04 1954) BP: (131-172)/(61-94) 140/75 (11/04 1954) SpO2:  [87 %-99 %] 92 % (11/04 1954) FiO2 (%):  [35 %-45 %] 45 % (11/04 1911)  PHYSICAL EXAMINATION: General:  Chronically ill appearing Caucasian female Neuro:  Alert and oriented, follows commands HEENT:  Supple, mild JVD Cardiovascular:  s1s2, rrr, no M/R/G Lungs:  Diminished with expiratory wheezes bilateral bases, non labored, even on venturi  mask Abdomen: +BS x4, soft, non tender, non distended Musculoskeletal: normal tone moves all extremities Skin:  Scattered ecchymosis, intact no rashes or lesions   Recent Labs Lab 10/07/16 0322 10/11/16 1534 10/12/16 0326  NA 136 137 139  K 4.7 3.9 4.0  CL 99* 92* 95*  CO2 32 38* 39*  BUN 12 16 14   CREATININE 0.62 0.46 0.49  GLUCOSE 151* 133* 136*    Recent Labs Lab 10/07/16 0322 10/11/16 1534 10/12/16 0326  HGB 17.2* 17.5* 16.2*  HCT 52.5* 51.0* 47.9*  WBC 7.5 8.7 7.8  PLT 284 207 186   Dg Chest 2 View  Result Date: 10/11/2016 CLINICAL DATA:  70 year old female with shortness of Breath for 6 days. Initial encounter. EXAM: CHEST  2 VIEW COMPARISON:  Portable chest 10/06/2016. FINDINGS: Large lung volumes. Basilar predominant increased interstitial markings in both lungs appear chronic. No pneumothorax, pulmonary edema, pleural effusion or consolidation. Calcified granuloma in the left mid lung. Normal cardiac size and mediastinal contours. Calcified aortic atherosclerosis. Visualized tracheal air column is within normal limits. Osteopenia. No acute osseous abnormality identified. IMPRESSION: 1. Chronic lung disease with hyperinflation. No acute cardiopulmonary abnormality. 2.  Calcified aortic atherosclerosis. Electronically Signed   By: Genevie Ann M.D.   On: 10/11/2016  16:14    ASSESSMENT / PLAN: Acute on chronic hypoxic respiratory failure secondary to AECOPD Hx: Current every day smoker P: Continue Azithromycin for 4 more days stop date 11/8 Supplemental O2 as needed to maintain O2 sats 88% to 92% Continue tessalon and robitussin  Continue bronchodilators Trend WBC's and monitor fever curve Obtain sputum culture Wean iv steroids will transition to po prednisone taper over 10 days once respiratory status improved Smoking cessation counseling provided  Marda Stalker, [place] Pager [phone] (please enter 7 digits) PCCM Consult Pager  [phone] (please enter 7 digits)

## 2016-10-12 NOTE — Progress Notes (Signed)
New Admission Note:   Arrival Method: per stretcher from ED, pt came from home Mental Orientation: alert and oriented X4 Telemetry: placed on box [id number], CCMD notified, verified with RN Olivia Mackie Assessment: Completed Skin: warm, dry, intact, no wounds/sores noted IV: G22 on the left wrist with transparent dressing, intact Pain: denies any pain as of this time Tubes: O2 inhalation at 4Lpm via face mask, pt uses 4Lpm at home Safety Measures: Safety Fall Prevention Plan has been given and discussed Admission: Completed 1A Orientation: Patient has been oriented to the room, unit and staff.  Family: son at bedside  Orders have been reviewed and implemented. Will continue to monitor the patient. Call light has been placed within reach and bed alarm has been activated.   Georgeanna Harrison BSN, RN [facility] 1A

## 2016-10-12 NOTE — Progress Notes (Signed)
Pt c/o of shortness of breath with every movement and stated "I'm not getting enough oxygen". Pt is on 4Lpm via simple mask, O2 sats 91%. Son at bedside requested if O2 inhalation can be increased to 5Lpm. On call MD Jannifer Franklin paged and made aware, says okay and ordered to increase O2 up to 5Lpm. Pt placed at 5Lpm via simple mask. Will continue to monitor.

## 2016-10-12 NOTE — Progress Notes (Signed)
Sats on 45% venturi mask are 92%. Left here, will continue to monitor.

## 2016-10-12 NOTE — Progress Notes (Signed)
Sats on Ventuir mask at 35% were 87%, increased to 45% and 10lpm. Sats came up to 91%. Left here, will continue to monitor.

## 2016-10-12 NOTE — Progress Notes (Addendum)
[place] at [place] NAME: Margaret Stevenson    MR#:  [id number]  DATE OF BIRTH:  Feb 13, 1946  SUBJECTIVE:  CHIEF COMPLAINT:   Chief Complaint  Patient presents with  . Shortness of Breath   Still has cough and shortness of breath, O2 is up to 5L [place]. SAT is 87%. REVIEW OF SYSTEMS:  Review of Systems  Constitutional: Positive for malaise/fatigue. Negative for chills and fever.  HENT: Negative for congestion.   Eyes: Negative for blurred vision and double vision.  Respiratory: Positive for cough, shortness of breath and wheezing. Negative for hemoptysis, sputum production and stridor.   Cardiovascular: Negative for leg swelling.  Gastrointestinal: Negative for abdominal pain, diarrhea, nausea and vomiting.  Genitourinary: Negative for dysuria and hematuria.  Musculoskeletal: Negative for joint pain.  Skin: Negative for itching and rash.  Neurological: Negative for dizziness, focal weakness, loss of consciousness and headaches.  Psychiatric/Behavioral: Negative for depression. The patient is not nervous/anxious.     DRUG ALLERGIES:   Allergies  Allergen Reactions  . Penicillins Hives    Has patient had a PCN reaction causing immediate rash, facial/tongue/throat swelling, SOB or lightheadedness with hypotension: no Has patient had a PCN reaction causing severe rash involving mucus membranes or skin necrosis: no Has patient had a PCN reaction that required hospitalization no Has patient had a PCN reaction occurring within the last 10 years: no If all of the above answers are "NO", then may proceed with Cephalosporin use.    VITALS:  Blood pressure (!) 141/62, pulse 95, temperature 98.5 F (36.9 C), temperature source Axillary, resp. rate 20, height 5\' 7"  (1.702 m), weight 142 lb (64.4 kg), SpO2 (!) 87 %. PHYSICAL EXAMINATION:  Physical Exam  Constitutional: She is oriented to person, place, and time and well-developed,  well-nourished, and in no distress.  HENT:  Head: Normocephalic.  Mouth/Throat: Oropharynx is clear and moist.  Eyes: Conjunctivae and EOM are normal.  Neck: Normal range of motion. Neck supple. No JVD present. No tracheal deviation present.  Cardiovascular: Normal rate, regular rhythm and normal heart sounds.  Exam reveals no gallop.   No murmur heard. Pulmonary/Chest: Effort normal. No respiratory distress. She has no wheezes. She has no rales.  Very diminished lung sounds bilaterally  Abdominal: Soft. Bowel sounds are normal. She exhibits no distension. There is no tenderness.  Musculoskeletal: Normal range of motion. She exhibits no edema or tenderness.  Neurological: She is alert and oriented to person, place, and time. No cranial nerve deficit.  Skin: No rash noted. No erythema.  Psychiatric: Affect normal.   LABORATORY PANEL:   CBC  Recent Labs Lab 10/12/16 0326  WBC 7.8  HGB 16.2*  HCT 47.9*  PLT 186   ------------------------------------------------------------------------------------------------------------------ Chemistries   Recent Labs Lab 10/07/16 0322  10/12/16 0326  NA 136  < > 139  K 4.7  < > 4.0  CL 99*  < > 95*  CO2 32  < > 39*  GLUCOSE 151*  < > 136*  BUN 12  < > 14  CREATININE 0.62  < > 0.49  CALCIUM 9.3  < > 8.5*  AST 21  --   --   ALT 15  --   --   ALKPHOS 63  --   --   BILITOT 0.5  --   --   < > = values in this interval not displayed. RADIOLOGY:  Dg Chest 2 View  Result Date: 10/11/2016 CLINICAL  DATA:  70 year old female with shortness of Breath for 6 days. Initial encounter. EXAM: CHEST  2 VIEW COMPARISON:  Portable chest 10/06/2016. FINDINGS: Large lung volumes. Basilar predominant increased interstitial markings in both lungs appear chronic. No pneumothorax, pulmonary edema, pleural effusion or consolidation. Calcified granuloma in the left mid lung. Normal cardiac size and mediastinal contours. Calcified aortic atherosclerosis.  Visualized tracheal air column is within normal limits. Osteopenia. No acute osseous abnormality identified. IMPRESSION: 1. Chronic lung disease with hyperinflation. No acute cardiopulmonary abnormality. 2.  Calcified aortic atherosclerosis. Electronically Signed   By: Genevie Ann M.D.   On: 10/11/2016 16:14   ASSESSMENT AND PLAN:   COPD exacerbation ([place]) - likely from residual inflammation after treatment of pneumonia. Pneumonia seems to have resolved well per CXR. Continue IV steroids, azithromycin, nebs when necessary, Tessalon for cough. On dulera, spiriva and singulair.  Acute on chronic respiratory failure with ypoxia.   on supplemental oxygen at 5 L now, Continue IV steroids, azithromycin, nebs when necessary, Tessalon for cough.  Hyperglycemia, improved, on sliding scale insulin and glucose checks   Anxiety - continue home meds   All the records are reviewed and case discussed with Care Management/Social Worker. Management plans discussed with the patient, family and they are in agreement.  CODE STATUS: Full code  TOTAL TIME TAKING CARE OF THIS PATIENT: 39 minutes.   More than 50% of the time was spent in counseling/coordination of care: YES  POSSIBLE D/C IN 3 DAYS, DEPENDING ON CLINICAL CONDITION.   Demetrios Loll M.D on 10/12/2016 at 2:58 PM  Between 7am to 6pm - Pager - [phone]  After 6pm go to [email or web address] - Proofreader  [facility]  Office  [phone]  CC: Primary care physician; Dion Body, MD  Note: This dictation was prepared with Dragon dictation along with smaller phrase technology. Any transcriptional errors that result from this process are unintentional.

## 2016-10-13 LAB — GLUCOSE, CAPILLARY
GLUCOSE-CAPILLARY: 152 mg/dL — AB (ref 65–99)
GLUCOSE-CAPILLARY: 198 mg/dL — AB (ref 65–99)
Glucose-Capillary: 145 mg/dL — ABNORMAL HIGH (ref 65–99)
Glucose-Capillary: 134 mg/dL — ABNORMAL HIGH (ref 65–99)

## 2016-10-13 MED ORDER — ALPRAZOLAM 1 MG PO TABS
1.0000 mg | ORAL_TABLET | Freq: Three times a day (TID) | ORAL | Status: DC | PRN
  Administered 2016-10-13 – 2016-10-18 (×8): 1 mg via ORAL
  Filled 2016-10-13 (×9): qty 1

## 2016-10-13 MED ORDER — NICOTINE 21 MG/24HR TD PT24
21.0000 mg | MEDICATED_PATCH | Freq: Every day | TRANSDERMAL | Status: DC
  Filled 2016-10-13 (×2): qty 1

## 2016-10-13 MED ORDER — BISACODYL 5 MG PO TBEC
5.0000 mg | DELAYED_RELEASE_TABLET | Freq: Every day | ORAL | Status: DC | PRN
  Administered 2016-10-13 – 2016-10-16 (×2): 5 mg via ORAL
  Filled 2016-10-13 (×3): qty 1

## 2016-10-13 MED ORDER — METHYLPREDNISOLONE SODIUM SUCC 40 MG IJ SOLR
40.0000 mg | Freq: Four times a day (QID) | INTRAMUSCULAR | Status: DC
  Administered 2016-10-13 – 2016-10-14 (×3): 40 mg via INTRAVENOUS
  Filled 2016-10-13 (×4): qty 1

## 2016-10-13 NOTE — Progress Notes (Addendum)
[place] at [place] NAME: Margaret Stevenson    MR#:  [id number]  DATE OF BIRTH:  Jan 01, 1946  SUBJECTIVE:  CHIEF COMPLAINT:   Chief Complaint  Patient presents with  . Shortness of Breath   Still has cough and shortness of breath, on O2 5L [place]. REVIEW OF SYSTEMS:  Review of Systems  Constitutional: Positive for malaise/fatigue. Negative for chills and fever.  HENT: Negative for congestion.   Eyes: Negative for blurred vision and double vision.  Respiratory: Positive for cough, shortness of breath and wheezing. Negative for hemoptysis, sputum production and stridor.   Cardiovascular: Negative for leg swelling.  Gastrointestinal: Negative for abdominal pain, diarrhea, nausea and vomiting.  Genitourinary: Negative for dysuria and hematuria.  Musculoskeletal: Negative for joint pain.  Skin: Negative for itching and rash.  Neurological: Positive for weakness. Negative for dizziness, focal weakness, loss of consciousness and headaches.  Psychiatric/Behavioral: Negative for depression. The patient is nervous/anxious.     DRUG ALLERGIES:   Allergies  Allergen Reactions  . Penicillins Hives    Has patient had a PCN reaction causing immediate rash, facial/tongue/throat swelling, SOB or lightheadedness with hypotension: no Has patient had a PCN reaction causing severe rash involving mucus membranes or skin necrosis: no Has patient had a PCN reaction that required hospitalization no Has patient had a PCN reaction occurring within the last 10 years: no If all of the above answers are "NO", then may proceed with Cephalosporin use.    VITALS:  Blood pressure (!) 141/59, pulse (!) 110, temperature (!) 96.8 F (36 C), temperature source Axillary, resp. rate 18, height 5\' 7"  (1.702 m), weight 142 lb (64.4 kg), SpO2 93 %. PHYSICAL EXAMINATION:  Physical Exam  Constitutional: She is oriented to person, place, and time and well-developed,  well-nourished, and in no distress.  HENT:  Head: Normocephalic.  Mouth/Throat: Oropharynx is clear and moist.  Eyes: Conjunctivae and EOM are normal.  Neck: Normal range of motion. Neck supple. No JVD present. No tracheal deviation present.  Cardiovascular: Regular rhythm and normal heart sounds.  Exam reveals no gallop.   No murmur heard. Tachycardia.  Pulmonary/Chest: Effort normal. No respiratory distress. She has no wheezes. She has no rales.  Very diminished lung sounds bilaterally  Abdominal: Soft. Bowel sounds are normal. She exhibits no distension. There is no tenderness.  Musculoskeletal: Normal range of motion. She exhibits no edema or tenderness.  Neurological: She is alert and oriented to person, place, and time. No cranial nerve deficit.  Skin: No rash noted. No erythema.  Psychiatric: Affect normal.   LABORATORY PANEL:   CBC  Recent Labs Lab 10/12/16 0326  WBC 7.8  HGB 16.2*  HCT 47.9*  PLT 186   ------------------------------------------------------------------------------------------------------------------ Chemistries   Recent Labs Lab 10/07/16 0322  10/12/16 0326  NA 136  < > 139  K 4.7  < > 4.0  CL 99*  < > 95*  CO2 32  < > 39*  GLUCOSE 151*  < > 136*  BUN 12  < > 14  CREATININE 0.62  < > 0.49  CALCIUM 9.3  < > 8.5*  AST 21  --   --   ALT 15  --   --   ALKPHOS 63  --   --   BILITOT 0.5  --   --   < > = values in this interval not displayed. RADIOLOGY:  No results found. ASSESSMENT AND PLAN:   COPD exacerbation ([place]) -  likely from residual inflammation after treatment of pneumonia. Pneumonia seems to have resolved well per CXR. Continue IV steroids, azithromycin  for 4 more days stop date 11/8, nebs when necessary, Tessalon for cough. On dulera, spiriva and singulair.  Acute on chronic respiratory failure with ypoxia.   on supplemental oxygen at 5 L now, Continue IV steroids, azithromycin, nebs when necessary, Tessalon for  cough.  Hyperglycemia, improved, on sliding scale insulin and glucose checks   Anxiety - continue home meds, xanax when necessary 3 times a day.  Tobacco abuse. Smoking cessation was counseled for 3 minutes. Nicotine patch.  Generalized weakness. PT evaluation. All the records are reviewed and case discussed with Care Management/Social Worker. Management plans discussed with the patient, her son and they are in agreement.  CODE STATUS: Full code  TOTAL TIME TAKING CARE OF THIS PATIENT: 39 minutes.   More than 50% of the time was spent in counseling/coordination of care: YES  POSSIBLE D/C IN 3 DAYS, DEPENDING ON CLINICAL CONDITION.   Demetrios Loll M.D on 10/13/2016 at 2:28 PM  Between 7am to 6pm - Pager - [phone]  After 6pm go to [email or web address] - Proofreader  [facility]  Office  [phone]  CC: Primary care physician; Dion Body, MD  Note: This dictation was prepared with Dragon dictation along with smaller phrase technology. Any transcriptional errors that result from this process are unintentional.

## 2016-10-13 NOTE — Care Management Important Message (Signed)
Important Message  Patient Details  Name: Margaret Stevenson MRN: [id number] Date of Birth: [birth year]   Medicare Important Message Given:  Yes    Licia Harl A, RN 10/13/2016, 1:11 PM

## 2016-10-14 LAB — GLUCOSE, CAPILLARY
GLUCOSE-CAPILLARY: 111 mg/dL — AB (ref 65–99)
GLUCOSE-CAPILLARY: 161 mg/dL — AB (ref 65–99)
GLUCOSE-CAPILLARY: 120 mg/dL — AB (ref 65–99)
Glucose-Capillary: 154 mg/dL — ABNORMAL HIGH (ref 65–99)

## 2016-10-14 MED ORDER — METHYLPREDNISOLONE SODIUM SUCC 40 MG IJ SOLR
40.0000 mg | Freq: Three times a day (TID) | INTRAMUSCULAR | Status: DC
  Administered 2016-10-14 – 2016-10-17 (×11): 40 mg via INTRAVENOUS
  Filled 2016-10-14 (×12): qty 1

## 2016-10-14 NOTE — Care Management Note (Signed)
Case Management Note  Patient Details  Name: Margaret Stevenson MRN: [id number] Date of Birth: Aug 09, 1946  Subjective/Objective:   Patient well known to [facility]. At discharge she was set up with [facility] for SN and PT. Never opened because patient return to the hospital before they could make a visit. At discharge would recommend SN, PT and SW with [facility]. Will follow progression.                Action/Plan: [facility] notified that patient has been readmitted.   Expected Discharge Date:                  Expected Discharge Plan:  [place]  In-House Referral:     Discharge planning Services  CM Consult  Post Acute Care Choice:    Choice offered to:     DME Arranged:    DME Agency:     HH Arranged:  SN, PT, SW HH Agency:  [place]  Status of Service:  In process, will continue to follow  If discussed at Long Length of Stay Meetings, dates discussed:    Additional Comments:  Jolly Mango, RN 10/14/2016, 11:43 AM

## 2016-10-14 NOTE — Progress Notes (Signed)
[place] at [place] NAME: Janirah Reape    MR#:  [id number]  DATE OF BIRTH:  04-Feb-1946  SUBJECTIVE:  CHIEF COMPLAINT:   Chief Complaint  Patient presents with  . Shortness of Breath   better cough and shortness of breath, on O2 4 L [place]. REVIEW OF SYSTEMS:  Review of Systems  Constitutional: Positive for malaise/fatigue. Negative for chills and fever.  HENT: Negative for congestion.   Eyes: Negative for blurred vision and double vision.  Respiratory: Positive for cough, shortness of breath and wheezing. Negative for hemoptysis, sputum production and stridor.   Cardiovascular: Negative for leg swelling.  Gastrointestinal: Negative for abdominal pain, diarrhea, nausea and vomiting.  Genitourinary: Negative for dysuria and hematuria.  Musculoskeletal: Negative for joint pain.  Skin: Negative for itching and rash.  Neurological: Positive for weakness. Negative for dizziness, focal weakness, loss of consciousness and headaches.  Psychiatric/Behavioral: Negative for depression. The patient is not nervous/anxious.     DRUG ALLERGIES:   Allergies  Allergen Reactions  . Penicillins Hives    Has patient had a PCN reaction causing immediate rash, facial/tongue/throat swelling, SOB or lightheadedness with hypotension: no Has patient had a PCN reaction causing severe rash involving mucus membranes or skin necrosis: no Has patient had a PCN reaction that required hospitalization no Has patient had a PCN reaction occurring within the last 10 years: no If all of the above answers are "NO", then may proceed with Cephalosporin use.    VITALS:  Blood pressure (!) 145/64, pulse 93, temperature 98 F (36.7 C), temperature source Oral, resp. rate 18, height 5\' 7"  (1.702 m), weight 142 lb (64.4 kg), SpO2 92 %. PHYSICAL EXAMINATION:  Physical Exam  Constitutional: She is oriented to person, place, and time and well-developed, well-nourished, and  in no distress.  HENT:  Head: Normocephalic.  Mouth/Throat: Oropharynx is clear and moist.  Eyes: Conjunctivae and EOM are normal.  Neck: Normal range of motion. Neck supple. No JVD present. No tracheal deviation present.  Cardiovascular: Normal rate, regular rhythm and normal heart sounds.  Exam reveals no gallop.   No murmur heard. Pulmonary/Chest: Effort normal. No respiratory distress. She has no wheezes. She has no rales.  Better air entry, diminished lung sounds bilaterally  Abdominal: Soft. Bowel sounds are normal. She exhibits no distension. There is no tenderness.  Musculoskeletal: Normal range of motion. She exhibits no edema or tenderness.  Neurological: She is alert and oriented to person, place, and time. No cranial nerve deficit.  Skin: No rash noted. No erythema.  Psychiatric: Affect normal.   LABORATORY PANEL:   CBC  Recent Labs Lab 10/12/16 0326  WBC 7.8  HGB 16.2*  HCT 47.9*  PLT 186   ------------------------------------------------------------------------------------------------------------------ Chemistries   Recent Labs Lab 10/12/16 0326  NA 139  K 4.0  CL 95*  CO2 39*  GLUCOSE 136*  BUN 14  CREATININE 0.49  CALCIUM 8.5*   RADIOLOGY:  No results found. ASSESSMENT AND PLAN:   COPD exacerbation ([place]) - likely from residual inflammation after treatment of pneumonia. Pneumonia seems to have resolved well per CXR. Continue IV steroids, azithromycin  for 1 more days stop date 11/8, nebs when necessary, Tessalon for cough. On dulera, spiriva and singulair.  Acute on chronic respiratory failure with ypoxia.   on supplemental oxygen at 4 L now, taper IV steroids, azithromycin, nebs when necessary, Tessalon for cough.  Hyperglycemia, improved, on sliding scale insulin and glucose checks  Anxiety - continue home meds, xanax when necessary 3 times a day.  Tobacco abuse. Smoking cessation was counseled for 3 minutes. Nicotine patch.  Generalized  weakness. PT evaluation. All the records are reviewed and case discussed with Care Management/Social Worker. Management plans discussed with the patient, her son and they are in agreement.  CODE STATUS: Full code  TOTAL TIME TAKING CARE OF THIS PATIENT: 39 minutes.   More than 50% of the time was spent in counseling/coordination of care: YES  POSSIBLE D/C IN 2-3 DAYS, DEPENDING ON CLINICAL CONDITION.   Demetrios Loll M.D on 10/14/2016 at 2:02 PM  Between 7am to 6pm - Pager - [phone]  After 6pm go to [email or web address] - Proofreader  [facility]  Office  [phone]  CC: Primary care physician; Dion Body, MD  Note: This dictation was prepared with Dragon dictation along with smaller phrase technology. Any transcriptional errors that result from this process are unintentional.

## 2016-10-15 LAB — GLUCOSE, CAPILLARY
GLUCOSE-CAPILLARY: 119 mg/dL — AB (ref 65–99)
GLUCOSE-CAPILLARY: 163 mg/dL — AB (ref 65–99)
GLUCOSE-CAPILLARY: 142 mg/dL — AB (ref 65–99)
Glucose-Capillary: 139 mg/dL — ABNORMAL HIGH (ref 65–99)

## 2016-10-15 MED ORDER — IPRATROPIUM-ALBUTEROL 0.5-2.5 (3) MG/3ML IN SOLN
3.0000 mL | Freq: Four times a day (QID) | RESPIRATORY_TRACT | Status: DC
  Administered 2016-10-16 – 2016-10-18 (×11): 3 mL via RESPIRATORY_TRACT
  Filled 2016-10-15 (×11): qty 3

## 2016-10-15 NOTE — Evaluation (Signed)
Physical Therapy Evaluation Patient Details Name: Margaret Stevenson MRN: [id number] DOB: 08-Jun-1946 Today's Date: 10/15/2016   History of Present Illness  Pt is a 70 y.o. female presenting to hospital with SOB, cough, and wheezing 1 day after being discharged from hospital on 10/10/16 (recent hospital admission d/t acute respiratory distress secondary to COPD exacerbation and [facility] community acquired PNA).   Pt now admitted with acute respiratory failure with hypoxia (desaturates when coughing) and COPD exacerbation; PNA appears to be resolved.  PMH includes COPD on home O2, melanoma, asthma, smokes.  Clinical Impression  Prior to recent hospital admissions, pt was independent with functional mobility.  Pt lives alone in 1 level home with stairs to enter.  Currently pt is modified independent with bed mobility, SBA to CGA with transfers, and CGA to ambulate 10-20 feet with intermittent UE support on furniture/walls to steady self (limited distance d/t O2 desaturation 83-88% on supplemental O2; returned to 91-92% on 5 L/min O2 via nasal cannula end of session).  Pt would benefit from skilled PT to address noted impairments and functional limitations.  Anticipate with continued improvement with pulmonary status, pt will be able to discharge to home with HHPT when medically appropriate.    Follow Up Recommendations Home health PT    Equipment Recommendations   (TBD)    Recommendations for Other Services       Precautions / Restrictions Precautions Precautions: Fall Restrictions Weight Bearing Restrictions: No      Mobility  Bed Mobility Overal bed mobility: Modified Independent Bed Mobility: Supine to Sit;Sit to Supine     Supine to sit: Modified independent (Device/Increase time);HOB elevated Sit to supine: Modified independent (Device/Increase time);HOB elevated      Transfers Overall transfer level: Needs assistance Equipment used: None Transfers: Sit to/from Stand Sit to Stand:  Supervision;Min guard         General transfer comment: steady standing with single UE support on bed rail  Ambulation/Gait Ambulation/Gait assistance: Min guard Ambulation Distance (Feet):  (20 feet; 10 feet) Assistive device: None   Gait velocity: decreased   General Gait Details: decreased B step length/foot clearance/heelstrike; pt intermittently holding onto furniture to steady self; limited distance d/t O2 desaturation.  For 20 feet ambulation trial, pt ambulated 10 feet and then stopped to put water in her flower vase at sink and then walked back to bed.  Stairs            Wheelchair Mobility    Modified Rankin (Stroke Patients Only)       Balance Overall balance assessment: Needs assistance Sitting-balance support: Bilateral upper extremity supported;Feet supported Sitting balance-Leahy Scale: Good     Standing balance support: No upper extremity supported;During functional activity Standing balance-Leahy Scale: Fair Standing balance comment: with ambulation                             Pertinent Vitals/Pain Pain Assessment: No/denies pain    Home Living Family/patient expects to be discharged to:: Private residence Living Arrangements: Alone Available Help at Discharge: Family (Son currently around to assist some) Type of Home: House Home Access: Stairs to enter Entrance Stairs-Rails: Psychiatric nurse of Steps: 5-6 Home Layout: One level Home Equipment: None      Prior Function Level of Independence: Independent         Comments: Pt denies any falls in past 6 months.     Hand Dominance  Extremity/Trunk Assessment   Upper Extremity Assessment: Generalized weakness           Lower Extremity Assessment: Generalized weakness         Communication   Communication: No difficulties  Cognition Arousal/Alertness: Awake/alert Behavior During Therapy: WFL for tasks assessed/performed Overall  Cognitive Status: Within Functional Limits for tasks assessed                      General Comments General comments (skin integrity, edema, etc.): Pt sitting up in bed upon PT arrival.  Attempted to see pt 10/14/16 in AM but pt refused so re-attempted PT eval today Nov 02, 2016.  Nursing cleared pt for participation in physical therapy.  Pt agreeable to PT session today.     Exercises  Purse lip breathing ex's; pacing with activity; ambulation   Assessment/Plan    PT Assessment Patient needs continued PT services  PT Problem List Decreased strength;Decreased activity tolerance;Decreased balance;Decreased mobility;Cardiopulmonary status limiting activity          PT Treatment Interventions DME instruction;Gait training;Stair training;Functional mobility training;Therapeutic activities;Therapeutic exercise;Balance training;Patient/family education    PT Goals (Current goals can be found in the Care Plan section)  Acute Rehab PT Goals Patient Stated Goal: to not feel SOB with activity PT Goal Formulation: With patient Time For Goal Achievement: 10/29/16 Potential to Achieve Goals: Fair    Frequency Min 2X/week   Barriers to discharge        Co-evaluation               End of Session Equipment Utilized During Treatment: Gait belt;Oxygen Activity Tolerance: Other (comment) (Limited d/t O2 desaturation) Patient left: in chair;with call bell/phone within reach;with chair alarm set Nurse Communication: Mobility status;Precautions (O2 desaturation with activity)         Time: 1115-1150 PT Time Calculation (min) (ACUTE ONLY): 35 min   Charges:   PT Evaluation $PT Eval Low Complexity: 1 Procedure PT Treatments $Therapeutic Exercise: 8-22 mins   PT G CodesLeitha Bleak 02-Nov-2016, 1:29 PM Leitha Bleak, [place]

## 2016-10-15 NOTE — Progress Notes (Addendum)
[place] at [place] NAME: Adaleena Maring    MR#:  [id number]  DATE OF BIRTH:  04-24-1946  SUBJECTIVE:  CHIEF COMPLAINT:   Chief Complaint  Patient presents with  . Shortness of Breath   still cough, wheezing and shortness of breath, on O2 5 L [place]. desat on exertion. REVIEW OF SYSTEMS:  Review of Systems  Constitutional: Positive for malaise/fatigue. Negative for chills and fever.  HENT: Negative for congestion.   Eyes: Negative for blurred vision and double vision.  Respiratory: Positive for cough, shortness of breath and wheezing. Negative for hemoptysis, sputum production and stridor.   Cardiovascular: Negative for leg swelling.  Gastrointestinal: Negative for abdominal pain, diarrhea, nausea and vomiting.  Genitourinary: Negative for dysuria and hematuria.  Musculoskeletal: Negative for joint pain.  Skin: Negative for itching and rash.  Neurological: Positive for weakness. Negative for dizziness, focal weakness, loss of consciousness and headaches.  Psychiatric/Behavioral: Negative for depression. The patient is not nervous/anxious.     DRUG ALLERGIES:   Allergies  Allergen Reactions  . Penicillins Hives    Has patient had a PCN reaction causing immediate rash, facial/tongue/throat swelling, SOB or lightheadedness with hypotension: no Has patient had a PCN reaction causing severe rash involving mucus membranes or skin necrosis: no Has patient had a PCN reaction that required hospitalization no Has patient had a PCN reaction occurring within the last 10 years: no If all of the above answers are "NO", then may proceed with Cephalosporin use.    VITALS:  Blood pressure 140/69, pulse (!) 104, temperature 97.9 F (36.6 C), temperature source Oral, resp. rate 19, height 5\' 7"  (1.702 m), weight 142 lb (64.4 kg), SpO2 90 %. PHYSICAL EXAMINATION:  Physical Exam  Constitutional: She is oriented to person, place, and time and  well-developed, well-nourished, and in no distress.  HENT:  Head: Normocephalic.  Mouth/Throat: Oropharynx is clear and moist.  Eyes: Conjunctivae and EOM are normal.  Neck: Normal range of motion. Neck supple. No JVD present. No tracheal deviation present.  Cardiovascular: Normal rate, regular rhythm and normal heart sounds.  Exam reveals no gallop.   No murmur heard. Pulmonary/Chest: Effort normal. No respiratory distress. She has no wheezes. She has no rales.  Better air entry, diminished lung sounds bilaterally  Abdominal: Soft. Bowel sounds are normal. She exhibits no distension. There is no tenderness.  Musculoskeletal: Normal range of motion. She exhibits no edema or tenderness.  Neurological: She is alert and oriented to person, place, and time. No cranial nerve deficit.  Skin: No rash noted. No erythema.  Psychiatric: Affect normal.   LABORATORY PANEL:   CBC  Recent Labs Lab 10/12/16 0326  WBC 7.8  HGB 16.2*  HCT 47.9*  PLT 186   ------------------------------------------------------------------------------------------------------------------ Chemistries   Recent Labs Lab 10/12/16 0326  NA 139  K 4.0  CL 95*  CO2 39*  GLUCOSE 136*  BUN 14  CREATININE 0.49  CALCIUM 8.5*   RADIOLOGY:  No results found. ASSESSMENT AND PLAN:   COPD exacerbation ([place]) - likely from residual inflammation after treatment of pneumonia. Pneumonia seems to have resolved well per CXR. Continue IV steroids, discontinue azithromycin, continue nebs when necessary, Tessalon for cough. On dulera, spiriva and singulair. Incentive spirometry.  Acute on chronic respiratory failure with ypoxia.   on supplemental oxygen at 5 L now, taper IV steroids, continue nebs when necessary, Tessalon for cough. Incentive spirometry. Continue current treatment per Dr. Raul Del.  Hyperglycemia, improved,  on sliding scale insulin and glucose checks   Anxiety - continue home meds, xanax when necessary 3  times a day.  Tobacco abuse. Smoking cessation was counseled for 3 minutes. Nicotine patch.  Generalized weakness. PT evaluation suggested home health and PT.  I discussed with Dr. Raul Del. All the records are reviewed and case discussed with Care Management/Social Worker. Management plans discussed with the patient, her son and they are in agreement.  CODE STATUS: Full code  TOTAL TIME TAKING CARE OF THIS PATIENT: 46 minutes.   More than 50% of the time was spent in counseling/coordination of care: YES  POSSIBLE D/C IN 2-3 DAYS, DEPENDING ON CLINICAL CONDITION.   Demetrios Loll M.D on 10/15/2016 at 3:04 PM  Between 7am to 6pm - Pager - [phone]  After 6pm go to [email or web address] - Proofreader  [facility]  Office  [phone]  CC: Primary care physician; Dion Body, MD  Note: This dictation was prepared with Dragon dictation along with smaller phrase technology. Any transcriptional errors that result from this process are unintentional.

## 2016-10-16 LAB — GLUCOSE, CAPILLARY
GLUCOSE-CAPILLARY: 116 mg/dL — AB (ref 65–99)
GLUCOSE-CAPILLARY: 129 mg/dL — AB (ref 65–99)
GLUCOSE-CAPILLARY: 111 mg/dL — AB (ref 65–99)
Glucose-Capillary: 145 mg/dL — ABNORMAL HIGH (ref 65–99)

## 2016-10-16 LAB — CREATININE, SERUM: CREATININE: 0.4 mg/dL — AB (ref 0.44–1.00)

## 2016-10-16 NOTE — Progress Notes (Signed)
[place] at [place] NAME: Margaret Stevenson    MR#:  [id number]  DATE OF BIRTH:  October 02, 1946  SUBJECTIVE:  CHIEF COMPLAINT:   Chief Complaint  Patient presents with  . Shortness of Breath   Better cough, wheezing and shortness of breath, on O2 3 L [place]. desat on exertion. REVIEW OF SYSTEMS:  Review of Systems  Constitutional: Positive for malaise/fatigue. Negative for chills and fever.  HENT: Negative for congestion.   Eyes: Negative for blurred vision and double vision.  Respiratory: Positive for cough, shortness of breath and wheezing. Negative for hemoptysis, sputum production and stridor.   Cardiovascular: Negative for leg swelling.  Gastrointestinal: Negative for abdominal pain, diarrhea, nausea and vomiting.  Genitourinary: Negative for dysuria and hematuria.  Musculoskeletal: Negative for joint pain.  Skin: Negative for itching and rash.  Neurological: Positive for weakness. Negative for dizziness, focal weakness, loss of consciousness and headaches.  Psychiatric/Behavioral: Negative for depression. The patient is not nervous/anxious.     DRUG ALLERGIES:   Allergies  Allergen Reactions  . Penicillins Hives    Has patient had a PCN reaction causing immediate rash, facial/tongue/throat swelling, SOB or lightheadedness with hypotension: no Has patient had a PCN reaction causing severe rash involving mucus membranes or skin necrosis: no Has patient had a PCN reaction that required hospitalization no Has patient had a PCN reaction occurring within the last 10 years: no If all of the above answers are "NO", then may proceed with Cephalosporin use.    VITALS:  Blood pressure (!) 142/58, pulse (!) 103, temperature 97.3 F (36.3 C), temperature source Oral, resp. rate (!) 23, height 5\' 7"  (1.702 m), weight 142 lb (64.4 kg), SpO2 91 %. PHYSICAL EXAMINATION:  Physical Exam  Constitutional: She is oriented to person, place, and time  and well-developed, well-nourished, and in no distress.  HENT:  Head: Normocephalic.  Mouth/Throat: Oropharynx is clear and moist.  Eyes: Conjunctivae and EOM are normal.  Neck: Normal range of motion. Neck supple. No JVD present. No tracheal deviation present.  Cardiovascular: Normal rate, regular rhythm and normal heart sounds.  Exam reveals no gallop.   No murmur heard. Pulmonary/Chest: Effort normal. No respiratory distress. She has no wheezes. She has no rales.  Better air entry, diminished lung sounds bilaterally  Abdominal: Soft. Bowel sounds are normal. She exhibits no distension. There is no tenderness.  Musculoskeletal: Normal range of motion. She exhibits no edema or tenderness.  Neurological: She is alert and oriented to person, place, and time. No cranial nerve deficit.  Skin: No rash noted. No erythema.  Psychiatric: Affect normal.   LABORATORY PANEL:   CBC  Recent Labs Lab 10/12/16 0326  WBC 7.8  HGB 16.2*  HCT 47.9*  PLT 186   ------------------------------------------------------------------------------------------------------------------ Chemistries   Recent Labs Lab 10/12/16 0326 10/16/16 0445  NA 139  --   K 4.0  --   CL 95*  --   CO2 39*  --   GLUCOSE 136*  --   BUN 14  --   CREATININE 0.49 0.40*  CALCIUM 8.5*  --    RADIOLOGY:  No results found. ASSESSMENT AND PLAN:   COPD exacerbation ([place]) - likely from residual inflammation after treatment of pneumonia. Pneumonia seems to have resolved well per CXR. taper IV steroids, discontinued azithromycin, continue nebs when necessary, Tessalon for cough. On dulera, spiriva and singulair. Incentive spirometry.  Acute on chronic respiratory failure with ypoxia.   on supplemental  oxygen at 3 L now, taper IV steroids, continue nebs when necessary, Tessalon for cough. Incentive spirometry. Continue current treatment per Dr. Raul Del.  Hyperglycemia, improved, on sliding scale insulin and glucose  checks   Anxiety - continue home meds, xanax when necessary 3 times a day.  Tobacco abuse. Smoking cessation was counseled for 3 minutes. Nicotine patch.  Generalized weakness. PT evaluation suggested home health and PT.  I discussed with Dr. Raul Del. All the records are reviewed and case discussed with Care Management/Social Worker. Management plans discussed with the patient, her son and they are in agreement.  CODE STATUS: Full code  TOTAL TIME TAKING CARE OF THIS PATIENT: 37 minutes.   More than 50% of the time was spent in counseling/coordination of care: YES  POSSIBLE D/C IN 2 DAYS, DEPENDING ON CLINICAL CONDITION.   Demetrios Loll M.D on 10/16/2016 at 2:32 PM  Between 7am to 6pm - Pager - [phone]  After 6pm go to [email or web address] - Proofreader  [facility]  Office  [phone]  CC: Primary care physician; Dion Body, MD  Note: This dictation was prepared with Dragon dictation along with smaller phrase technology. Any transcriptional errors that result from this process are unintentional.

## 2016-10-16 NOTE — Care Management (Signed)
RNCM ask Margaret Stevenson with PT to reevaluate whether it was a safe discharge for patient to be discharged home alone. Son states he is not able to stay with patient 24 hours per day and he has to return home. She will continue to evaluate patient

## 2016-10-16 NOTE — Care Management (Addendum)
Received message from [employer] that per RN son was concerned about patient going home alone. TC to son Dawna Martine [phone]). He expressed his desire for patient to be discharged to a SNF. RNCM explained to him PT recommendations were home health and that his mother was alert and oriented and has clearly expressed her desire to return home. He stated that she she didn't need to go home until she could walk to the stove and to the bathroom. I further explained PT recommendations and and Medicare guidelines. He quickly became angry and began yelling over the phone.  He stated that," You are no care manager because you don't care"  I expressed my desire to help in any way possible and provide resources for private duty nursing and he continued to yell. Barnabas Lister threatened to file a Electrical engineer. He began cursing at [employer]. At that point I referred him to my team lead , Marshell Garfinkel and her number. Marshell Garfinkel updated regarding conversation.

## 2016-10-17 LAB — GLUCOSE, CAPILLARY
GLUCOSE-CAPILLARY: 109 mg/dL — AB (ref 65–99)
GLUCOSE-CAPILLARY: 157 mg/dL — AB (ref 65–99)
Glucose-Capillary: 185 mg/dL — ABNORMAL HIGH (ref 65–99)
Glucose-Capillary: 167 mg/dL — ABNORMAL HIGH (ref 65–99)

## 2016-10-17 MED ORDER — LACTULOSE 10 GM/15ML PO SOLN
20.0000 g | Freq: Two times a day (BID) | ORAL | Status: DC | PRN
  Administered 2016-10-17: 20 g via ORAL
  Filled 2016-10-17: qty 30

## 2016-10-17 MED ORDER — IPRATROPIUM-ALBUTEROL 0.5-2.5 (3) MG/3ML IN SOLN
3.0000 mL | Freq: Four times a day (QID) | RESPIRATORY_TRACT | Status: DC | PRN
  Administered 2016-10-17: 3 mL via RESPIRATORY_TRACT
  Filled 2016-10-17: qty 3

## 2016-10-17 NOTE — Progress Notes (Signed)
Physical Therapy Treatment Patient Details Name: Margaret Stevenson MRN: [id number] DOB: Nov 21, 1946 Today's Date: 10/17/2016    History of Present Illness Pt is a 70 y.o. female presenting to hospital with SOB, cough, and wheezing 1 day after being discharged from hospital on 10/10/16 (recent hospital admission d/t acute respiratory distress secondary to COPD exacerbation and [facility] community acquired PNA).   Pt now admitted with acute respiratory failure with hypoxia (desaturates when coughing) and COPD exacerbation; PNA appears to be resolved.  PMH includes COPD on home O2, melanoma, asthma, smokes.    PT Comments    Pt able to ambulate further today (80 feet CGA with occasional use of UE support).  Pt c/o not being able to breathe intermittently (O2 sats ranging 88-89% to 94% with brief 84% reading during ambulation although question accuracy of 84% reading d/t O2 quickly returning to 88-89% while pt still ambulating; all on 4 L/min O2 via nasal cannula).  PT attempted to have pt ambulate back to room d/t pt reporting difficulty breathing but pt declined and walked past room further prior to returning back to room.  O2 sats 92% on 4 L supplemental O2 end of PT session.  PT received call that pt requesting PT return to room after session and pt then stating that she over-did it trying to "show off" for PT and now she couldn't breathe.  O2 sats 91% on 4 L/min supplemental O2.  Pt reporting that although her O2 sats were "ok" she still couldn't breathe and felt her O2 was at 79% (mild SOB noted).  Nursing notified and unit secretary called respiratory for breathing treatment for pt.  Pt would benefit from STR to improve activity tolerance and pulmonary tolerance to be able to perform ADL's and IADL's at home (pt lives alone and reports no one is able to help her and she does not feel comfortable at home by herself d/t breathing concerns with required daily home activities; pt also reports not being on chronic  home O2 prior to recent hospital admissions but did occasional use O2 as needed).   Follow Up Recommendations  SNF     Equipment Recommendations  None recommended by PT    Recommendations for Other Services       Precautions / Restrictions Precautions Precautions: Fall Restrictions Weight Bearing Restrictions: No    Mobility  Bed Mobility Overal bed mobility: Modified Independent Bed Mobility: Supine to Sit;Sit to Supine     Supine to sit: Modified independent (Device/Increase time);HOB elevated Sit to supine: Modified independent (Device/Increase time);HOB elevated      Transfers Overall transfer level: Independent Equipment used: None Transfers: Sit to/from [employer] to Stand: Independent Stand pivot transfers: Independent       General transfer comment: steady without UE support  Ambulation/Gait Ambulation/Gait assistance: Min guard Ambulation Distance (Feet): 80 Feet Assistive device:  (pt mostly without UE support but occasionally using single UE support) Gait Pattern/deviations: Step-through pattern Gait velocity: decreased   General Gait Details: decreased B step length/foot clearance/heelstrike; limited distance d/t pt reporting not being able to breathe well (pt occasionally briefly stopping in standing)   Stairs            Wheelchair Mobility    Modified Rankin (Stroke Patients Only)       Balance Overall balance assessment: Needs assistance Sitting-balance support: No upper extremity supported;Feet supported Sitting balance-Leahy Scale: Normal     Standing balance support: No upper extremity supported;During functional activity Standing balance-Leahy  Scale: Good Standing balance comment: with ambulation                    Cognition Arousal/Alertness: Awake/alert Behavior During Therapy: WFL for tasks assessed/performed Overall Cognitive Status: Within Functional Limits for tasks assessed                       Exercises      General Comments General comments (skin integrity, edema, etc.): Pt laying in bed upon PT arrival   Nursing cleared pt for participation in physical therapy.  Pt agreeable to PT session.     Pertinent Vitals/Pain Pain Assessment: No/denies pain  HR 104-120 bpm during session.    Home Living                      Prior Function            PT Goals (current goals can now be found in the care plan section) Acute Rehab PT Goals Patient Stated Goal: to not feel SOB with activity PT Goal Formulation: With patient Time For Goal Achievement: 10/29/16 Potential to Achieve Goals: Fair Progress towards PT goals: Progressing toward goals    Frequency    Min 2X/week      PT Plan Discharge plan needs to be updated (CM and SW notified)    Co-evaluation             End of Session Equipment Utilized During Treatment: Gait belt;Oxygen (4 L/min) Activity Tolerance: Other (comment) (Limited d/t pt reporting not being able to breathe well) Patient left: in bed;with call bell/phone within reach (pt moderate fall risk (fall risk score 5))     Time: [id number] PT Time Calculation (min) (ACUTE ONLY): 25 min  Charges:  $Therapeutic Exercise: 8-22 mins $Therapeutic Activity: 8-22 mins                    G CodesLeitha Bleak 2016/11/09, 11:23 AM Leitha Bleak, [place]

## 2016-10-17 NOTE — Progress Notes (Signed)
[place] making rounds visited Pt. Pt has other things going on at the time of this visit, so CH was not able to carry a conversation with the Pt. [place] plans to make a follow-up visit with the Pt soon.    10/17/16 1500  Clinical Encounter Type  Visited With Patient  Visit Type Initial;Follow-up;Spiritual support  Referral From Chaplain  Consult/Referral To Chaplain  Spiritual Encounters  Spiritual Needs Prayer

## 2016-10-17 NOTE — NC FL2 (Signed)
[place] LEVEL OF CARE SCREENING TOOL     IDENTIFICATION  Patient Name: Margaret Stevenson Birthdate: 1946-07-22 Sex: female Admission Date (Current Location): 10/11/2016  [place] and [place] Number:  Engineering geologist and Address:  [facility], [address], [place], [place] [address]      Provider Number: [id number]  Attending Physician Name and Address:  Demetrios Loll, MD  Relative Name and Phone Number:       Current Level of Care: Hospital Recommended Level of Care: [place] Prior Approval Number:    Date Approved/Denied:   PASRR Number:  ([id number])  Discharge Plan: SNF    Current Diagnoses: Patient Active Problem List   Diagnosis Date Noted  . Hypoxia 10/11/2016  . Anxiety 10/11/2016  . Acute respiratory failure ([place]) 10/06/2016  . COPD exacerbation ([place]) 10/06/2016  . CAP (community acquired pneumonia) 10/06/2016  . Hyperglycemia 10/06/2016    Orientation RESPIRATION BLADDER Height & Weight     Self, Time, Situation, Place  O2 (4 Liters Oxygen ) Continent Weight: 142 lb (64.4 kg) Height:  5\' 7"  (170.2 cm)  BEHAVIORAL SYMPTOMS/MOOD NEUROLOGICAL BOWEL NUTRITION STATUS   (none)  (none) Continent Diet (Diet: Carb Modified )  AMBULATORY STATUS COMMUNICATION OF NEEDS Skin   Extensive Assist Verbally Normal                       Personal Care Assistance Level of Assistance  Bathing, Feeding, Dressing Bathing Assistance: Limited assistance Feeding assistance: Independent Dressing Assistance: Limited assistance     Functional Limitations Info  Sight, Hearing, Speech Sight Info: Adequate Hearing Info: Adequate Speech Info: Adequate    SPECIAL CARE FACTORS FREQUENCY  PT (By licensed PT), OT (By licensed OT)     PT Frequency:  (5) OT Frequency:  (5)            Contractures      Additional Factors Info  Code Status, Allergies, Insulin Sliding Scale Code Status Info:  (Full Code.  ) Allergies Info:  (Penicillins)   Insulin Sliding Scale Info:  (NovoLog Insulin Injections. )       Current Medications (10/17/2016):  This is the current hospital active medication list Current Facility-Administered Medications  Medication Dose Route Frequency Provider Last Rate Last Dose  . acetaminophen (TYLENOL) tablet 650 mg  650 mg Oral Q6H PRN Lance Coon, MD       Or  . acetaminophen (TYLENOL) suppository 650 mg  650 mg Rectal Q6H PRN Lance Coon, MD      . ALPRAZolam Duanne Moron) tablet 1 mg  1 mg Oral TID PRN Demetrios Loll, MD   1 mg at 10/16/16 2336  . benzonatate (TESSALON) capsule 100 mg  100 mg Oral BID Lance Coon, MD   100 mg at 10/17/16 0811  . bisacodyl (DULCOLAX) EC tablet 5 mg  5 mg Oral Daily PRN Demetrios Loll, MD   5 mg at 10/16/16 1304  . enoxaparin (LOVENOX) injection 40 mg  40 mg Subcutaneous Q24H Lance Coon, MD   40 mg at 10/15/16 2053  . guaiFENesin-dextromethorphan (ROBITUSSIN DM) 100-10 MG/5ML syrup 5 mL  5 mL Oral Q4H PRN Demetrios Loll, MD      . insulin aspart (novoLOG) injection 0-5 Units  0-5 Units Subcutaneous QHS Lance Coon, MD      . insulin aspart (novoLOG) injection 0-9 Units  0-9 Units Subcutaneous TID WC Lance Coon, MD   1 Units at 10/16/16 1811  .  ipratropium-albuterol (DUONEB) 0.5-2.5 (3) MG/3ML nebulizer solution 3 mL  3 mL Nebulization Q6H Demetrios Loll, MD   3 mL at 10/17/16 0749  . lactulose (CHRONULAC) 10 GM/15ML solution 20 g  20 g Oral BID PRN Demetrios Loll, MD   20 g at 10/17/16 1108  . methylPREDNISolone sodium succinate (SOLU-MEDROL) 40 mg/mL injection 40 mg  40 mg Intravenous Q8H Demetrios Loll, MD   40 mg at 10/17/16 0605  . mometasone-formoterol (DULERA) 200-5 MCG/ACT inhaler 2 puff  2 puff Inhalation BID Lance Coon, MD   2 puff at 10/17/16 [phone]  . montelukast (SINGULAIR) tablet 10 mg  10 mg Oral QHS Lance Coon, MD   10 mg at 10/16/16 2151  . nicotine (NICODERM CQ - dosed in mg/24 hours) patch 21 mg  21 mg Transdermal Daily Demetrios Loll, MD      .  ondansetron [facility]) tablet 4 mg  4 mg Oral Q6H PRN Lance Coon, MD       Or  . ondansetron [facility]) injection 4 mg  4 mg Intravenous Q6H PRN Lance Coon, MD      . sodium chloride flush (NS) 0.9 % injection 3 mL  3 mL Intravenous Q12H Lance Coon, MD   3 mL at 10/16/16 2152  . tiotropium (SPIRIVA) inhalation capsule 18 mcg  18 mcg Inhalation QHS Lance Coon, MD   18 mcg at 10/16/16 2247     Discharge Medications: Please see discharge summary for a list of discharge medications.  Relevant Imaging Results:  Relevant Lab Results:   Additional Information  (SSN: [id number])  Parneet Glantz, [name], LCSW

## 2016-10-17 NOTE — Care Management (Signed)
Case discussed with PT. They are recommending SNF and now patient is agreeable. CSW aware.

## 2016-10-17 NOTE — Progress Notes (Signed)
Date: 10/17/2016,   MRN# [id number] Margaret Stevenson October 20, 1946 Code Status:     Code Status Orders        Start     Ordered   10/11/16 2317  Full code  Continuous     10/11/16 2316    Code Status History    Date Active Date Inactive Code Status Order ID Comments User Context   10/06/2016  7:07 PM 10/10/2016  3:02 PM Full Code [id number]  Idelle Crouch, MD ED    Advance Directive Documentation   Flowsheet Row Most Recent Value  Type of Advance Directive  Healthcare Power of Attorney, Living will  Pre-existing out of facility DNR order (yellow form or pink MOST form)  No data  "MOST" Form in Place?  No data     Hosp day:@LENGTHOFSTAYDAYS @ Referring MD: @ATDPROV @      CC: Low sats  HPI: This is a 70 yr old lady, who continue to smoke. Known to have copd, stage III. She was here the later part of October with sob, cough, wheezing and on w/u noted to hane a left lower lobe pneumonia. Unfortunately one after discharge returned c/o being unable to breathe. Presently on 4-5 liters oxygen. D/c plans in progress, less sob, weak, marginal sats,  she has agreed to go to rehab. SEE hpi notes  PMHX:   Past Medical History:  Diagnosis Date  . Cancer ([place])    melanoma 07/2013  . COPD (chronic obstructive pulmonary disease) ([place])    Surgical Hx:  Past Surgical History:  Procedure Laterality Date  . TONSILLECTOMY     Family Hx:  Family History  Problem Relation Age of Onset  . Breast cancer Mother 67   Social Hx:   Social History  Substance Use Topics  . Smoking status: Current Every Day Smoker    Packs/day: 2.00    Types: Cigarettes  . Smokeless tobacco: Never Used  . Alcohol use No   Medication:    Home Medication:    Current Medication: @CURMEDTAB @   Allergies:  Penicillins  Review of Systems: Gen:  Denies  fever, sweats, chills HEENT: Denies blurred vision, double vision, ear pain, eye pain, hearing loss, nose bleeds, sore throat Cvc:  No dizziness, chest pain or  heaviness Resp: less sob today, occasional cough, sats marginal    Gi: Denies swallowing difficulty, stomach pain, nausea or vomiting, diarrhea, constipation, bowel incontinence Gu:  Denies bladder incontinence, burning urine Ext:   No Joint pain, stiffness or swelling Skin: No skin rash, easy bruising or bleeding or hives Endoc:  No polyuria, polydipsia , polyphagia or weight change Psych: No depression, insomnia or hallucinations  Other:  All other systems negative  Physical Examination:   VS: BP (!) 132/59 (BP Location: Right Arm)   Pulse 96   Temp 98.1 F (36.7 C) (Oral)   Resp 18   Ht 5\' 7"  (1.702 m)   Wt 142 lb (64.4 kg)   SpO2 92%   BMI 22.24 kg/m   General Appearance: No distress  Neuro: without focal findings, mental status, speech normal, alert and oriented, cranial nerves 2-12 intact, reflexes normal and symmetric, sensation grossly normal  HEENT: PERRLA, EOM intact, no ptosis, no other lesions noticed Pulmonary:.No wheezing, No rales     Cardiovascular:  Normal S1,S2.  No m/r/g.    Abdomen:Benign, Soft, non-tender, No masses, hepatosplenomegaly, No lymphadenopathy Endoc: No evident thyromegaly, no signs of acromegaly or Cushing features Skin:   warm, no rashes, no ecchymosis  Extremities: normal, no cyanosis, clubbing, no edema, warm with normal capillary refill. Other findings:   Labs results:   Recent Labs     10/16/16  0445  CREATININE  0.40*  ,     Rad results:  Study Result   CLINICAL DATA:  70 year old female with shortness of Breath for 6 days. Initial encounter.  EXAM: CHEST  2 VIEW  COMPARISON:  Portable chest 10/06/2016.  FINDINGS: Large lung volumes. Basilar predominant increased interstitial markings in both lungs appear chronic. No pneumothorax, pulmonary edema, pleural effusion or consolidation. Calcified granuloma in the left mid lung. Normal cardiac size and mediastinal contours. Calcified aortic atherosclerosis. Visualized  tracheal air column is within normal limits. Osteopenia. No acute osseous abnormality identified.  IMPRESSION: 1. Chronic lung disease with hyperinflation. No acute cardiopulmonary abnormality. 2.  Calcified aortic atherosclerosis.   Electronically Signed   By: Genevie Ann M.D.   On: 10/11/2016 16:14     Assessment and Plan: Stage III copd, recent LLL pneumonia which has resolved radiographicaly.  Marginal o2 sats, on dvt prophylaxis. Still has room for more improvement, hence agree that she should go to rehab. Her copd regimen: laba, ics, anticholinergic, tens days  total of emperic antibiotics as you doing and prednisone taper. Will see after d/c from rehab with pfts, will see in the am    I have personally obtained a history, examined the patient, evaluated laboratory and imaging results, formulated the assessment and plan and placed orders.  The Patient requires high complexity decision making for assessment and support, frequent evaluation and titration of therapies, application of advanced monitoring technologies and extensive interpretation of multiple databases.   Darriel Utter,M.D. Pulmonary & Critical care Medicine [facility]

## 2016-10-17 NOTE — Progress Notes (Signed)
[place] [facility] [place] NAME: Margaret Stevenson    MR#:  [id number]  DATE OF BIRTH:  [birth year]  SUBJECTIVE:  CHIEF COMPLAINT:   Chief Complaint  Patient presents with  . Shortness of Breath   Better cough, wheezing and shortness of breath, on O2 4 L [place]. Still desat on exertion. REVIEW OF SYSTEMS:  Review of Systems  Constitutional: Positive for malaise/fatigue. Negative for chills and fever.  HENT: Negative for congestion.   Eyes: Negative for blurred vision and double vision.  Respiratory: Positive for cough, shortness of breath and wheezing. Negative for hemoptysis, sputum production and stridor.   Cardiovascular: Negative for leg swelling.  Gastrointestinal: Negative for abdominal pain, diarrhea, nausea and vomiting.  Genitourinary: Negative for dysuria and hematuria.  Musculoskeletal: Negative for joint pain.  Skin: Negative for itching and rash.  Neurological: Positive for weakness. Negative for dizziness, focal weakness, loss of consciousness and headaches.  Psychiatric/Behavioral: Negative for depression. The patient is not nervous/anxious.     DRUG ALLERGIES:   Allergies  Allergen Reactions  . Penicillins Hives    Has patient had a PCN reaction causing immediate rash, facial/tongue/throat swelling, SOB or lightheadedness with hypotension: no Has patient had a PCN reaction causing severe rash involving mucus membranes or skin necrosis: no Has patient had a PCN reaction that required hospitalization no Has patient had a PCN reaction occurring within the last 10 years: no If all of the above answers are "NO", then may proceed with Cephalosporin use.    VITALS:  Blood pressure (!) 144/56, pulse (!) 120, temperature 97.9 F (36.6 C), temperature source Oral, resp. rate 19, height 5\' 7"  (1.702 m), weight 142 lb (64.4 kg), SpO2 92 %. PHYSICAL EXAMINATION:  Physical Exam  Constitutional: She is oriented to person, place, and time  and well-developed, well-nourished, and in no distress.  HENT:  Head: Normocephalic.  Mouth/Throat: Oropharynx is clear and moist.  Eyes: Conjunctivae and EOM are normal.  Neck: Normal range of motion. Neck supple. No JVD present. No tracheal deviation present.  Cardiovascular: Normal rate, regular rhythm and normal heart sounds.  Exam reveals no gallop.   No murmur heard. Pulmonary/Chest: Effort normal. No respiratory distress. She has no wheezes. She has no rales.  Better air entry, diminished lung sounds bilaterally  Abdominal: Soft. Bowel sounds are normal. She exhibits no distension. There is no tenderness.  Musculoskeletal: Normal range of motion. She exhibits no edema or tenderness.  Neurological: She is alert and oriented to person, place, and time. No cranial nerve deficit.  Skin: No rash noted. No erythema.  Psychiatric: Affect normal.   LABORATORY PANEL:   CBC  Recent Labs Lab 10/12/16 0326  WBC 7.8  HGB 16.2*  HCT 47.9*  PLT 186   ------------------------------------------------------------------------------------------------------------------ Chemistries   Recent Labs Lab 10/12/16 0326 10/16/16 0445  NA 139  --   K 4.0  --   CL 95*  --   CO2 39*  --   GLUCOSE 136*  --   BUN 14  --   CREATININE 0.49 0.40*  CALCIUM 8.5*  --    RADIOLOGY:  No results found. ASSESSMENT AND PLAN:   COPD exacerbation ([place]) - likely from residual inflammation after treatment of pneumonia. Pneumonia seems to have resolved well per CXR. taper IV steroids, discontinued azithromycin, continue nebs when necessary, Tessalon for cough. On dulera, spiriva and singulair. Incentive spirometry.  Acute on chronic respiratory failure with ypoxia.   on supplemental  oxygen at 4 L now, taper IV steroids, continue nebs when necessary, Tessalon for cough. Incentive spirometry. Continue current treatment per Dr. Raul Del.  Hyperglycemia, improved, on sliding scale insulin and glucose  checks   Anxiety - continue home meds, xanax when necessary 3 times a day.  Tobacco abuse. Smoking cessation was counseled for 3 minutes. Nicotine patch.  Generalized weakness. PT evaluation suggested SNF.  All the records are reviewed and case discussed with Care Management/Social Worker. Management plans discussed with the patient, her son and they are in agreement.  CODE STATUS: Full code  TOTAL TIME TAKING CARE OF THIS PATIENT: 36 minutes.   More than 50% of the time was spent in counseling/coordination of care: YES  POSSIBLE D/C IN 2 DAYS, DEPENDING ON CLINICAL CONDITION.   Demetrios Loll M.D on 10/17/2016 at 12:43 PM  Between 7am to 6pm - Pager - [phone]  After 6pm go to [email or web address] - Proofreader  [employer] [facility]  Office  [phone]  CC: Primary care physician; Dion Body, MD  Note: This dictation was prepared with Dragon dictation along with smaller phrase technology. Any transcriptional errors that result from this process are unintentional.

## 2016-10-17 NOTE — Progress Notes (Signed)
Per assessment pt states she has not had a bowel movement in over a week, Pt offered suppository,miralax, or enema, Pt refused. Pt finally agreed to take PRN Dulcolax 5mg .

## 2016-10-17 NOTE — Progress Notes (Signed)
Social work Theatre manager met with patient and patient's son at bedside. [facility] did accept patient and patient is willing to go there once discharged. Social work Theatre manager explained that [facility] is a smoke free facility and that patient would not be able to smoke while there. According to patient and patient's son, patient has stopped smoking. Clinical social worker (CSW) made admissions coordinator at [facility] aware of above. Patient will most likely be discharged tomorrow. Social work Theatre manager will continue to assist and follow as needed.  Danie Chandler, Social Work Intern  [phone]

## 2016-10-17 NOTE — Clinical Social Work Placement (Signed)
   CLINICAL SOCIAL WORK PLACEMENT  NOTE  Date:  10/17/2016  Patient Details  Name: Margaret Stevenson MRN: [id number] Date of Birth: 03-22-1946  Clinical Social Work is seeking post-discharge placement for this patient at the [place] level of care (*CSW will initial, date and re-position this form in  chart as items are completed):  Yes   Patient/family provided with [place] Work Department's list of facilities offering this level of care within the geographic area requested by the patient (or if unable, by the patient's family).  Yes   Patient/family informed of their freedom to choose among providers that offer the needed level of care, that participate in Medicare, Medicaid or managed care program needed by the patient, have an available bed and are willing to accept the patient.  Yes   Patient/family informed of [facility]'s ownership interest in [facility], as well as of the fact that they are under no obligation to receive care at these facilities.  PASRR submitted to EDS on 10/17/16     PASRR number received on 10/17/16     Existing PASRR number confirmed on       FL2 transmitted to all facilities in geographic area requested by pt/family on 10/17/16     FL2 transmitted to all facilities within larger geographic area on       Patient informed that his/her managed care company has contracts with or will negotiate with certain facilities, including the following:        Yes   Patient/family informed of bed offers received.  Patient chooses bed at  [facility] )     Physician recommends and patient chooses bed at      Patient to be transferred to   on  .  Patient to be transferred to facility by       Patient family notified on   of transfer.  Name of family member notified:        PHYSICIAN       Additional Comment:    _______________________________________________ Providence Stivers, [name],  LCSW 10/17/2016, 11:57 AM

## 2016-10-17 NOTE — Progress Notes (Signed)
Patient has not had BM in over a week. Bisacodyl suppository offered, pt refused. Fleets enema offered, patient refused.

## 2016-10-17 NOTE — Progress Notes (Signed)
Pt has not had a BM since 10/29, pt refusing bowel regimen at this time.

## 2016-10-17 NOTE — Clinical Social Work Note (Signed)
Clinical Social Work Assessment  Patient Details  Name: Margaret Stevenson MRN: [id number] Date of Birth: Jun 30, 1946  Date of referral:  10/17/16               Reason for consult:  Discharge Planning, Facility Placement                Permission sought to share information with:  Chartered certified accountant granted to share information::  Yes, Verbal Permission Granted  Name::      [name]::   [place]   Relationship::     Contact Information:     Housing/Transportation Living arrangements for the past 2 months:  [place] of Information:  Patient Patient Interpreter Needed:  None Criminal Activity/Legal Involvement Pertinent to Current Situation/Hospitalization:    Significant Relationships:  Adult Children Lives with:  Self Do you feel safe going back to the place where you live?  Yes Need for family participation in patient care:  Yes (Comment)  Care giving concerns:  Patient lives at home by herself in [place].    Social Worker assessment / plan:  Holiday representative (CSW) received consult. PT is recommending SNF. Social work Theatre manager met with patient at bedside. Patient was sitting up in bed. Patient was alert and oriented. Per patient, she lives by herself at her home in [place]. Patient has several adult children, but all live out of state. Social work Theatre manager presented bed offers to patient. Patient it preferring to go to [facility], but it open to looking at other facilities. Patient is concerned about her breathing. Patient's HPOA is her son Margaret Stevenson.   Fl2 completed and faxed out.   Employment status:  Retired Forensic scientist:  Medicare PT Recommendations:  [place] / Referral to community resources:  [place]  Patient/Family's Response to care:  Patient verbally agreed to go to SNF for short-term rehab.   Patient/Family's Understanding of and Emotional  Response to Diagnosis, Current Treatment, and Prognosis:  Patient was concerned about her breathing. Patient thanked social work Theatre manager for coming by.   Emotional Assessment Appearance:  Appears stated age Attitude/Demeanor/Rapport:    Affect (typically observed):  Accepting, Adaptable Orientation:  Oriented to Self, Oriented to Place, Oriented to  Time, Oriented to Situation Alcohol / Substance use:  Not Applicable Psych involvement (Current and /or in the community):  No (Comment)  Discharge Needs  Concerns to be addressed:  Basic Needs, Discharge Planning Concerns Readmission within the last 30 days:  No Current discharge risk:  None Barriers to Discharge:  Continued Medical Work up   [employer], [place] Work 10/17/2016, 11:41 AM

## 2016-10-18 ENCOUNTER — Encounter
Admission: RE | Admit: 2016-10-18 | Discharge: 2016-10-18 | Disposition: A | Discharge status: Home / Self Care | Payer: Medicare Other | Source: Ambulatory Visit | Attending: Internal Medicine | Admitting: Internal Medicine

## 2016-10-18 LAB — CREATININE, SERUM: CREATININE: 0.45 mg/dL (ref 0.44–1.00)

## 2016-10-18 MED ORDER — ALPRAZOLAM 1 MG PO TABS: 1.0000 mg | ORAL_TABLET | Freq: Three times a day (TID) | ORAL | 0 refills | Status: DC | PRN

## 2016-10-18 MED ORDER — BENZONATATE 100 MG PO CAPS: 100.0000 mg | ORAL_CAPSULE | Freq: Two times a day (BID) | ORAL | 0 refills | Status: DC

## 2016-10-18 MED ORDER — BISACODYL 5 MG PO TBEC: 5.0000 mg | DELAYED_RELEASE_TABLET | Freq: Every day | ORAL | 0 refills | Status: DC | PRN

## 2016-10-18 NOTE — Plan of Care (Signed)
Problem: Education: Goal: Knowledge of [facility] Education information/materials will improve Outcome: Progressing Free of falls during shift.  Denies pain.  Requested and received PRN xanax for sleep.  Requested and received PRN bisacodyl.  not stocked in [facility], No issues overnight.  Bed in low position, call bell within reach.  WCTM.

## 2016-10-18 NOTE — Discharge Summary (Signed)
[facility] [place] NAME: Margaret Stevenson    MR#:  [id number]  DATE OF BIRTH:  08/12/1946  DATE OF ADMISSION:  10/11/2016   ADMITTING PHYSICIAN: Lance Coon, MD  DATE OF DISCHARGE:10/18/2016 PRIMARY CARE PHYSICIAN: Dion Body, MD   ADMISSION DIAGNOSIS:  COPD exacerbation ([place]) [J44.1] DISCHARGE DIAGNOSIS:  Principal Problem:   COPD exacerbation ([place]) Active Problems:   Hyperglycemia   Hypoxia   Anxiety Acute on chronic respiratory failure with hypoxia.  SECONDARY DIAGNOSIS:   Past Medical History:  Diagnosis Date  . Cancer ([place])    melanoma 07/2013  . COPD (chronic obstructive pulmonary disease) [facility])    HOSPITAL COURSE:   COPD exacerbation ([place]) - likely from residual inflammation after treatment of pneumonia. Pneumonia seems to have resolved well per CXR. She has been treated with solumedrol, change to prednisone taper, discontinued azithromycin, continue nebs when necessary, Tessalon for cough. On dulera, spiriva and singulair. Incentive spirometry.  Acute on chronic respiratory failure with hypoxia.   on supplemental oxygen at 4 L now, taper steroids, continue nebs when necessary, Tessalon for cough. Incentive spirometry. Continue current treatment per Dr. Raul Del.  Hyperglycemia, improved, on sliding scale insulin and glucose checks  Anxiety - continue home meds, xanax when necessary 3 times a day.  Tobacco abuse. Smoking cessation was counseled for 3 minutes. Nicotine patch.  Generalized weakness. PT evaluation suggested SNF.  DISCHARGE CONDITIONS:  Stable, discharge to nursing home today. CONSULTS OBTAINED:  Treatment Team:  Erby Pian, MD DRUG ALLERGIES:   Allergies  Allergen Reactions  . Penicillins Hives    Has patient had a PCN reaction causing immediate rash, facial/tongue/throat swelling, SOB or lightheadedness with hypotension: no Has patient had a PCN reaction causing severe  rash involving mucus membranes or skin necrosis: no Has patient had a PCN reaction that required hospitalization no Has patient had a PCN reaction occurring within the last 10 years: no If all of the above answers are "NO", then may proceed with Cephalosporin use.    DISCHARGE MEDICATIONS:     Medication List    STOP taking these medications   levofloxacin 500 MG tablet Commonly known as:  LEVAQUIN     TAKE these medications   ALPRAZolam 1 MG tablet Commonly known as:  XANAX Take 1 tablet (1 mg total) by mouth 3 (three) times daily as needed for anxiety. What changed:  when to take this   benzonatate 100 MG capsule Commonly known as:  TESSALON Take 1 capsule (100 mg total) by mouth 2 (two) times daily.   bisacodyl 5 MG EC tablet Commonly known as:  DULCOLAX Take 1 tablet (5 mg total) by mouth daily as needed for moderate constipation.   etodolac 500 MG tablet Commonly known as:  LODINE Take 500 mg by mouth daily as needed.   guaiFENesin 600 MG 12 hr tablet Commonly known as:  MUCINEX Take 1 tablet (600 mg total) by mouth 2 (two) times daily as needed.   ipratropium-albuterol 0.5-2.5 (3) MG/3ML Soln Commonly known as:  DUONEB Take 3 mLs by nebulization every 4 (four) hours.   mometasone-formoterol 200-5 MCG/ACT Aero Commonly known as:  DULERA Inhale 2 puffs into the lungs 2 (two) times daily.   montelukast 10 MG tablet Commonly known as:  SINGULAIR Take 1 tablet (10 mg total) by mouth at bedtime.   predniSONE 10 MG tablet Commonly known as:  DELTASONE Take 6 tablets (60 mg total) by mouth daily with  breakfast. Taper by 10 mg daily then stop   tiotropium 18 MCG inhalation capsule Commonly known as:  SPIRIVA Place 18 mcg into inhaler and inhale at bedtime.   VENTOLIN HFA 108 (90 Base) MCG/ACT inhaler Generic drug:  albuterol Inhale 2 puffs into the lungs every 4 (four) hours as needed.        DISCHARGE INSTRUCTIONS:  See AVS. OXYGEN:  Home Oxygen: 3-4  L [place]    If you experience worsening of your admission symptoms, develop shortness of breath, life threatening emergency, suicidal or homicidal thoughts you must seek medical attention immediately by calling 911 or calling your MD immediately  if symptoms less severe.  You Must read complete instructions/literature along with all the possible adverse reactions/side effects for all the Medicines you take and that have been prescribed to you. Take any new Medicines after you have completely understood and accpet all the possible adverse reactions/side effects.   Please note  You were cared for by a hospitalist during your hospital stay. If you have any questions about your discharge medications or the care you received while you were in the hospital after you are discharged, you can call the unit and asked to speak with the hospitalist on call if the hospitalist that took care of you is not available. Once you are discharged, your primary care physician will handle any further medical issues. Please note that NO REFILLS for any discharge medications will be authorized once you are discharged, as it is imperative that you return to your primary care physician (or establish a relationship with a primary care physician if you do not have one) for your aftercare needs so that they can reassess your need for medications and monitor your lab values.    On the day of Discharge:  VITAL SIGNS:  Blood pressure 139/62, pulse 95, temperature 97.9 F (36.6 C), temperature source Oral, resp. rate 20, height 5\' 7"  (1.702 m), weight 142 lb (64.4 kg), SpO2 95 %. PHYSICAL EXAMINATION:  GENERAL:  70 y.o.-year-old patient lying in the bed with no acute distress.  EYES: Pupils equal, round, reactive to light and accommodation. No scleral icterus. Extraocular muscles intact.  HEENT: Head atraumatic, normocephalic. Oropharynx and nasopharynx clear.  NECK:  Supple, no jugular venous distention. No thyroid enlargement, no  tenderness.  LUNGS: diminished breath sounds bilaterally, no wheezing, rales,rhonchi or crepitation. No use of accessory muscles of respiration.  CARDIOVASCULAR: S1, S2 normal. No murmurs, rubs, or gallops.  ABDOMEN: Soft, non-tender, non-distended. Bowel sounds present. No organomegaly or mass.  EXTREMITIES: No pedal edema, cyanosis, or clubbing.  NEUROLOGIC: Cranial nerves II through XII are intact. Muscle strength 5/5 in all extremities. Sensation intact. Gait not checked.  PSYCHIATRIC: The patient is alert and oriented x 3.  SKIN: No obvious rash, lesion, or ulcer.  DATA REVIEW:   CBC  Recent Labs Lab 10/12/16 0326  WBC 7.8  HGB 16.2*  HCT 47.9*  PLT 186    Chemistries   Recent Labs Lab 10/12/16 0326  10/18/16 0456  NA 139  --   --   K 4.0  --   --   CL 95*  --   --   CO2 39*  --   --   GLUCOSE 136*  --   --   BUN 14  --   --   CREATININE 0.49  < > 0.45  CALCIUM 8.5*  --   --   < > = values in this interval not displayed.  Microbiology Results  Results for orders placed or performed during the hospital encounter of 10/06/16  Blood culture (routine x 2)     Status: None   Collection Time: 10/06/16  7:30 PM  Result Value Ref Range Status   Specimen Description BLOOD LEFT HAND  Final   Special Requests   Final    BOTTLES DRAWN AEROBIC AND ANAEROBIC 13CCAERO,11CCANA   Culture NO GROWTH 5 DAYS  Final   Report Status 10/11/2016 FINAL  Final  Blood culture (routine x 2)     Status: None   Collection Time: 10/06/16  7:37 PM  Result Value Ref Range Status   Specimen Description BLOOD RIGHT HAND  Final   Special Requests BOTTLES DRAWN AEROBIC AND ANAEROBIC 10CC  Final   Culture NO GROWTH 5 DAYS  Final   Report Status 10/11/2016 FINAL  Final  MRSA PCR Screening     Status: None   Collection Time: 10/06/16  7:38 PM  Result Value Ref Range Status   MRSA by PCR NEGATIVE NEGATIVE Final    Comment:        The GeneXpert MRSA Assay (FDA approved for NASAL  specimens only), is one component of a comprehensive MRSA colonization surveillance program. It is not intended to diagnose MRSA infection nor to guide or monitor treatment for MRSA infections.     RADIOLOGY:  No results found.   Management plans discussed with the patient, family and they are in agreement.  CODE STATUS:     Code Status Orders        Start     Ordered   10/11/16 2317  Full code  Continuous     10/11/16 2316    Code Status History    Date Active Date Inactive Code Status Order ID Comments User Context   10/06/2016  7:07 PM 10/10/2016  3:02 PM Full Code [id number]  Idelle Crouch, MD ED    Advance Directive Documentation   Flowsheet Row Most Recent Value  Type of Advance Directive  Healthcare Power of Attorney, Living will  Pre-existing out of facility DNR order (yellow form or pink MOST form)  No data  "MOST" Form in Place?  No data      TOTAL TIME TAKING CARE OF THIS PATIENT: 33 minutes.    Demetrios Loll M.D on 10/18/2016 at 9:48 AM  Between 7am to 6pm - Pager - [phone]  After 6pm go to [email or web address] - Proofreader  [facility]  Office  [phone]  CC: Primary care physician; Dion Body, MD   Note: This dictation was prepared with Dragon dictation along with smaller phrase technology. Any transcriptional errors that result from this process are unintentional.

## 2016-10-18 NOTE — Progress Notes (Signed)
Patient is medically stable for D/C to [facility] today. Per [facility] admissions coordinator at [facility] patient will go to room 202-B. RN will call report to [phone]. Clinical Education officer, museum (CSW) sent D/C orders to [employer] via [place]. Patient's son Barnabas Lister will transport patient with her oxygen tank. Patient is aware of above. CSW contacted patient's son Barnabas Lister and made him aware of above. Please reconsult if future social work needs arise. CSW signing off.   [employer], LCSW [phone]

## 2016-10-18 NOTE — Progress Notes (Signed)
Patient given discharge orders from RN. I called [facility] and gave report to [employer] about pt. RN assessment and VS show stability. Follow up appointments made. Prescription for Xanax. Pt going to room 202-B. Son transporting via car. Home O2 on 4 liters. When ready will wheel to front. Waiting on ride.

## 2016-10-18 NOTE — Discharge Instructions (Signed)
Heart healthy diet. O2 [place] 3-4 L. Smoking cessation.

## 2016-10-18 NOTE — Care Management Important Message (Signed)
Important Message  Patient Details  Name: Margaret Stevenson MRN: [id number] Date of Birth: 1946-10-19   Medicare Important Message Given:  Yes    Jolly Mango, RN 10/18/2016, 10:46 AM

## 2016-10-18 NOTE — Clinical Social Work Placement (Signed)
   CLINICAL SOCIAL WORK PLACEMENT  NOTE  Date:  10/18/2016  Patient Details  Name: Margaret Stevenson MRN: [id number] Date of Birth: Mar 01, 1946  Clinical Social Work is seeking post-discharge placement for this patient at the [place] level of care (*CSW will initial, date and re-position this form in  chart as items are completed):  Yes   Patient/family provided with [place] Work Department's list of facilities offering this level of care within the geographic area requested by the patient (or if unable, by the patient's family).  Yes   Patient/family informed of their freedom to choose among providers that offer the needed level of care, that participate in Medicare, Medicaid or managed care program needed by the patient, have an available bed and are willing to accept the patient.  Yes   Patient/family informed of [facility]'s ownership interest in [facility] and [facility], as well as of the fact that they are under no obligation to receive care at these facilities.  PASRR submitted to EDS on 10/17/16     PASRR number received on 10/17/16     Existing PASRR number confirmed on       FL2 transmitted to all facilities in geographic area requested by pt/family on 10/17/16     FL2 transmitted to all facilities within larger geographic area on       Patient informed that his/her managed care company has contracts with or will negotiate with certain facilities, including the following:        Yes   Patient/family informed of bed offers received.  Patient chooses bed at  [facility] )     Physician recommends and patient chooses bed at      Patient to be transferred to  [facility] ) on 10/18/16.  Patient to be transferred to facility by  (Patient's son Margaret Stevenson will transport in private vehicle.  )     Patient family notified on 10/18/16 of transfer.  Name of family member notified:   (Patient's son Margaret Stevenson is awre of D/C today.    )     PHYSICIAN       Additional Comment:    _______________________________________________ Margaret Stevenson, [name], LCSW 10/18/2016, 11:14 AM

## 2016-10-29 ENCOUNTER — Non-Acute Institutional Stay (SKILLED_NURSING_FACILITY): Payer: Medicare Other | Admitting: Gerontology

## 2016-10-29 DIAGNOSIS — J441 Chronic obstructive pulmonary disease with (acute) exacerbation: Secondary | ICD-10-CM

## 2016-11-07 NOTE — Progress Notes (Signed)
Location:      Place of Service:  SNF (31)  Provider: Toni Arthurs, NP-C  PCP: Dion Body, MD Patient Care Team: Dion Body, MD as PCP - General (Family Medicine)  Extended Emergency Contact Information Primary Emergency Contact: Mccutchan,Jack Address: [address] of [place] Phone: [phone] Relation: Son Secondary Emergency Contact: [place] of [place] Mobile Phone: [phone] Relation: Friend  Code Status: DNR Goals of care:  Advanced Directive information Advanced Directives 10/12/2016  Does Patient Have a Medical Advance Directive? Yes  Type of Paramedic of [place];Living will  Does patient want to make changes to medical advance directive? No - Patient declined  Copy of [place] in Chart? No - copy requested     Allergies  Allergen Reactions  . Penicillins Hives    Has patient had a PCN reaction causing immediate rash, facial/tongue/throat swelling, SOB or lightheadedness with hypotension: no Has patient had a PCN reaction causing severe rash involving mucus membranes or skin necrosis: no Has patient had a PCN reaction that required hospitalization no Has patient had a PCN reaction occurring within the last 10 years: no If all of the above answers are "NO", then may proceed with Cephalosporin use.     Chief Complaint  Patient presents with  . Discharge Note    HPI:  70 y.o. female see for discharge evaluation. Margaret Stevenson is a 70 year old female admitted to the rehab facility for rehab following hospitalization for COPD exacerbation. Pt has progressed well with therapy. She says she is feeling much better. Pt denies n/v/d/f/c/cp/sob/ha/abd pain/dizziness. She says she is ready to go home so she "can relax and watch football tomorrow." VSS. No other complaints.      Past Medical History:  Diagnosis Date  . Cancer ([place])    melanoma 07/2013  . COPD  (chronic obstructive pulmonary disease) ([place])     Past Surgical History:  Procedure Laterality Date  . TONSILLECTOMY        reports that she has been smoking Cigarettes.  She has been smoking about 2.00 packs per day. She has never used smokeless tobacco. She reports that she does not drink alcohol or use drugs. Social History   Social History  . Marital status: Divorced    Spouse name: N/A  . Number of children: N/A  . Years of education: N/A   Occupational History  . Not on file.   Social History Main Topics  . Smoking status: Current Every Day Smoker    Packs/day: 2.00    Types: Cigarettes  . Smokeless tobacco: Never Used  . Alcohol use No  . Drug use: No  . Sexual activity: Not on file   Other Topics Concern  . Not on file   Social History Narrative  . No narrative on file   Functional Status Survey:    Allergies  Allergen Reactions  . Penicillins Hives    Has patient had a PCN reaction causing immediate rash, facial/tongue/throat swelling, SOB or lightheadedness with hypotension: no Has patient had a PCN reaction causing severe rash involving mucus membranes or skin necrosis: no Has patient had a PCN reaction that required hospitalization no Has patient had a PCN reaction occurring within the last 10 years: no If all of the above answers are "NO", then may proceed with Cephalosporin use.     There are no preventive care reminders to display for this  patient.  Medications:   Medication List       Accurate as of 10/29/16 11:59 PM. Always use your most recent med list.          ALPRAZolam 1 MG tablet Commonly known as:  XANAX Take 1 tablet (1 mg total) by mouth 3 (three) times daily as needed for anxiety.   benzonatate 100 MG capsule Commonly known as:  TESSALON Take 1 capsule (100 mg total) by mouth 2 (two) times daily.   bisacodyl 5 MG EC tablet Commonly known as:  DULCOLAX Take 1 tablet (5 mg total) by mouth daily as needed for moderate  constipation.   etodolac 500 MG tablet Commonly known as:  LODINE Take 500 mg by mouth daily as needed.   guaiFENesin 600 MG 12 hr tablet Commonly known as:  MUCINEX Take 1 tablet (600 mg total) by mouth 2 (two) times daily as needed.   ipratropium-albuterol 0.5-2.5 (3) MG/3ML Soln Commonly known as:  DUONEB Take 3 mLs by nebulization every 4 (four) hours.   mometasone-formoterol 200-5 MCG/ACT Aero Commonly known as:  DULERA Inhale 2 puffs into the lungs 2 (two) times daily.   montelukast 10 MG tablet Commonly known as:  SINGULAIR Take 1 tablet (10 mg total) by mouth at bedtime.   predniSONE 10 MG tablet Commonly known as:  DELTASONE Take 6 tablets (60 mg total) by mouth daily with breakfast. Taper by 10 mg daily then stop   tiotropium 18 MCG inhalation capsule Commonly known as:  SPIRIVA Place 18 mcg into inhaler and inhale at bedtime.   VENTOLIN HFA 108 (90 Base) MCG/ACT inhaler Generic drug:  albuterol Inhale 2 puffs into the lungs every 4 (four) hours as needed.       Review of Systems  Constitutional: Negative for activity change, appetite change, chills, diaphoresis and fever.  HENT: Negative for congestion, sneezing, sore throat, trouble swallowing and voice change.   Respiratory: Negative for apnea, cough, choking, chest tightness, shortness of breath and wheezing.   Cardiovascular: Negative for chest pain, palpitations and leg swelling.  Gastrointestinal: Negative for abdominal distention, abdominal pain, constipation, diarrhea and nausea.  Genitourinary: Negative for difficulty urinating, dysuria, frequency and urgency.  Musculoskeletal: Negative for back pain, gait problem and myalgias. Arthralgias: typical arthritis.  Skin: Negative for color change, pallor, rash and wound.  Neurological: Negative for dizziness, tremors, syncope, speech difficulty, weakness, numbness and headaches.  Psychiatric/Behavioral: Negative for agitation and behavioral problems.    All other systems reviewed and are negative.   Vitals:   10/28/16 0900  BP: (!) 98/47  Pulse: 87  Resp: 16  Temp: (!) 96.9 F (36.1 C)  SpO2: 96%  Weight: 138 lb 4.8 oz (62.7 kg)   Body mass index is 21.66 kg/m. Physical Exam  Constitutional: She is oriented to person, place, and time. Vital signs are normal. She appears well-developed and well-nourished. She is active and cooperative. She does not appear ill. No distress. Nasal cannula in place.  HENT:  Head: Normocephalic and atraumatic.  Mouth/Throat: Uvula is midline, oropharynx is clear and moist and mucous membranes are normal. Mucous membranes are not pale, not dry and not cyanotic.  Eyes: Conjunctivae, EOM and lids are normal. Pupils are equal, round, and reactive to light.  Neck: Trachea normal, normal range of motion and full passive range of motion without pain. Neck supple. No JVD present. No tracheal deviation, no edema and no erythema present. No thyromegaly present.  Cardiovascular: Normal rate, regular rhythm, normal heart sounds,  intact distal pulses and normal pulses.  Exam reveals no gallop, no distant heart sounds and no friction rub.   No murmur heard. Pulmonary/Chest: Effort normal and breath sounds normal. No accessory muscle usage. No respiratory distress. She has no wheezes. She has no rales. She exhibits no tenderness.  Abdominal: Normal appearance and bowel sounds are normal. She exhibits no distension and no ascites. There is no tenderness.  Musculoskeletal: Normal range of motion. She exhibits no edema or tenderness.  Expected osteoarthritis, stiffness  Neurological: She is alert and oriented to person, place, and time. She has normal strength.  Skin: Skin is warm, dry and intact. She is not diaphoretic. No cyanosis. No pallor. Nails show no clubbing.  Psychiatric: She has a normal mood and affect. Her speech is normal and behavior is normal. Judgment and thought content normal. Cognition and memory are  normal.  Nursing note and vitals reviewed.   Labs reviewed: Basic Metabolic Panel:  Recent Labs  10/07/16 0322 10/11/16 1534 10/12/16 0326 10/16/16 0445 10/18/16 0456  NA 136 137 139  --   --   K 4.7 3.9 4.0  --   --   CL 99* 92* 95*  --   --   CO2 32 38* 39*  --   --   GLUCOSE 151* 133* 136*  --   --   BUN 12 16 14   --   --   CREATININE 0.62 0.46 0.49 0.40* 0.45  CALCIUM 9.3 8.8* 8.5*  --   --    Liver Function Tests:  Recent Labs  10/06/16 1600 10/07/16 0322  AST 21 21  ALT 15 15  ALKPHOS 66 63  BILITOT 0.7 0.5  PROT 7.7 7.6  ALBUMIN 4.4 4.1   No results for input(s): LIPASE, AMYLASE in the last 8760 hours. No results for input(s): AMMONIA in the last 8760 hours. CBC:  Recent Labs  10/07/16 0322 10/11/16 1534 10/12/16 0326  WBC 7.5 8.7 7.8  HGB 17.2* 17.5* 16.2*  HCT 52.5* 51.0* 47.9*  MCV 98.9 95.8 96.4  PLT 284 207 186   Cardiac Enzymes:  Recent Labs  10/06/16 1600 10/11/16 1534  TROPONINI <0.03 <0.03   BNP: Invalid input(s): POCBNP CBG:  Recent Labs  10/17/16 1111 10/17/16 1731 10/17/16 2051  GLUCAP 185* 157* 167*    Procedures and Imaging Studies During Stay: Dg Chest 2 View  Result Date: 10/11/2016 CLINICAL DATA:  70 year old female with shortness of Breath for 6 days. Initial encounter. EXAM: CHEST  2 VIEW COMPARISON:  Portable chest 10/06/2016. FINDINGS: Large lung volumes. Basilar predominant increased interstitial markings in both lungs appear chronic. No pneumothorax, pulmonary edema, pleural effusion or consolidation. Calcified granuloma in the left mid lung. Normal cardiac size and mediastinal contours. Calcified aortic atherosclerosis. Visualized tracheal air column is within normal limits. Osteopenia. No acute osseous abnormality identified. IMPRESSION: 1. Chronic lung disease with hyperinflation. No acute cardiopulmonary abnormality. 2.  Calcified aortic atherosclerosis. Electronically Signed   By: Genevie Ann M.D.   On:  10/11/2016 16:14    Assessment/Plan:   1. COPD exacerbation ([place])  Continue Oxygen prn  Continue exercises instructed per PT  Follow up with pcp asap after discharge  No RX needed   Patient is being discharged with the following home health services: none   Patient is being discharged with the following durable medical equipment:  None, has all she needs already at home  Patient has been advised to f/u with their PCP in 1-2 weeks to bring  them up to date on their rehab stay.  Social services at facility was responsible for arranging this appointment.  Pt was provided with a 30 day supply of prescriptions for medications and refills must be obtained from their PCP.  For controlled substances, a more limited supply may be provided adequate until PCP appointment only.  Future labs/tests needed:  None, per pcp  Family/ staff Communication:   Total Time:  Documentation:  Face to Face:  Family/Phone:  Vikki Ports, NP-C [facility] [address],  [address] Cell Phone (Mon-Fri 8am-5pm):  [phone] On Call:  [phone] & follow prompts after 5pm & weekends Office Phone:  [phone] Office Fax:  [phone]

## 2016-11-08 ENCOUNTER — Encounter: Admission: RE | Admit: 2016-11-08 | Payer: Medicare Other | Source: Ambulatory Visit | Admitting: Internal Medicine

## 2017-04-08 DIAGNOSIS — R17 Unspecified jaundice: Secondary | ICD-10-CM

## 2017-04-08 HISTORY — DX: Unspecified jaundice: R17

## 2017-04-17 ENCOUNTER — Ambulatory Visit
Admission: RE | Admit: 2017-04-17 | Discharge: 2017-04-17 | Disposition: A | Discharge status: Home / Self Care | Payer: Medicare Other | Source: Ambulatory Visit | Attending: Family Medicine | Admitting: Family Medicine

## 2017-04-17 ENCOUNTER — Other Ambulatory Visit: Payer: Self-pay | Admitting: Family Medicine

## 2017-04-17 DIAGNOSIS — K8020 Calculus of gallbladder without cholecystitis without obstruction: Secondary | ICD-10-CM | POA: Insufficient documentation

## 2017-04-17 DIAGNOSIS — N2889 Other specified disorders of kidney and ureter: Secondary | ICD-10-CM | POA: Insufficient documentation

## 2017-04-17 DIAGNOSIS — J984 Other disorders of lung: Secondary | ICD-10-CM | POA: Insufficient documentation

## 2017-04-17 DIAGNOSIS — K759 Inflammatory liver disease, unspecified: Secondary | ICD-10-CM | POA: Diagnosis not present

## 2017-04-17 DIAGNOSIS — K6389 Other specified diseases of intestine: Secondary | ICD-10-CM | POA: Diagnosis not present

## 2017-04-17 DIAGNOSIS — I700 Atherosclerosis of aorta: Secondary | ICD-10-CM | POA: Insufficient documentation

## 2017-04-17 DIAGNOSIS — R17 Unspecified jaundice: Secondary | ICD-10-CM | POA: Diagnosis present

## 2017-04-17 DIAGNOSIS — K5730 Diverticulosis of large intestine without perforation or abscess without bleeding: Secondary | ICD-10-CM | POA: Insufficient documentation

## 2017-04-17 DIAGNOSIS — M5136 Other intervertebral disc degeneration, lumbar region: Secondary | ICD-10-CM | POA: Diagnosis not present

## 2017-04-17 LAB — POCT I-STAT CREATININE: Creatinine, Ser: 0.5 mg/dL (ref 0.44–1.00)

## 2017-04-17 MED ORDER — IOPAMIDOL (ISOVUE-300) INJECTION 61%
100.0000 mL | Freq: Once | INTRAVENOUS | Status: AC | PRN
  Administered 2017-04-17: 100 mL via INTRAVENOUS

## 2017-04-18 ENCOUNTER — Encounter: Payer: Self-pay | Admitting: *Deleted

## 2017-04-18 ENCOUNTER — Encounter (HOSPITAL_COMMUNITY): Payer: Self-pay | Admitting: General Practice

## 2017-04-18 ENCOUNTER — Emergency Department: Payer: Medicare Other

## 2017-04-18 ENCOUNTER — Emergency Department
Admission: EM | Admit: 2017-04-18 | Discharge: 2017-04-18 | DRG: 192 | Disposition: A | Discharge status: Home / Self Care | Payer: Medicare Other | Attending: Family Medicine | Admitting: Family Medicine

## 2017-04-18 ENCOUNTER — Observation Stay (HOSPITAL_COMMUNITY)
Admission: AD | Admit: 2017-04-18 | Discharge: 2017-04-19 | Disposition: A | Discharge status: Home / Self Care | Payer: Medicare Other | Source: Other Acute Inpatient Hospital | Attending: Family Medicine | Admitting: Family Medicine

## 2017-04-18 DIAGNOSIS — Z79899 Other long term (current) drug therapy: Secondary | ICD-10-CM | POA: Diagnosis not present

## 2017-04-18 DIAGNOSIS — Z85820 Personal history of malignant melanoma of skin: Secondary | ICD-10-CM | POA: Diagnosis not present

## 2017-04-18 DIAGNOSIS — Z86010 Personal history of colonic polyps: Secondary | ICD-10-CM | POA: Diagnosis not present

## 2017-04-18 DIAGNOSIS — Z7951 Long term (current) use of inhaled steroids: Secondary | ICD-10-CM | POA: Diagnosis not present

## 2017-04-18 DIAGNOSIS — K810 Acute cholecystitis: Secondary | ICD-10-CM | POA: Diagnosis not present

## 2017-04-18 DIAGNOSIS — R1011 Right upper quadrant pain: Secondary | ICD-10-CM | POA: Diagnosis not present

## 2017-04-18 DIAGNOSIS — Z880 Allergy status to penicillin: Secondary | ICD-10-CM | POA: Diagnosis not present

## 2017-04-18 DIAGNOSIS — J449 Chronic obstructive pulmonary disease, unspecified: Secondary | ICD-10-CM | POA: Diagnosis not present

## 2017-04-18 DIAGNOSIS — Z803 Family history of malignant neoplasm of breast: Secondary | ICD-10-CM | POA: Insufficient documentation

## 2017-04-18 DIAGNOSIS — Z9889 Other specified postprocedural states: Secondary | ICD-10-CM | POA: Insufficient documentation

## 2017-04-18 DIAGNOSIS — Z87891 Personal history of nicotine dependence: Secondary | ICD-10-CM | POA: Diagnosis not present

## 2017-04-18 DIAGNOSIS — Z800 Family history of malignant neoplasm of digestive organs: Secondary | ICD-10-CM | POA: Diagnosis not present

## 2017-04-18 DIAGNOSIS — R17 Unspecified jaundice: Secondary | ICD-10-CM | POA: Diagnosis present

## 2017-04-18 DIAGNOSIS — F17210 Nicotine dependence, cigarettes, uncomplicated: Secondary | ICD-10-CM | POA: Diagnosis not present

## 2017-04-18 DIAGNOSIS — R935 Abnormal findings on diagnostic imaging of other abdominal regions, including retroperitoneum: Secondary | ICD-10-CM | POA: Insufficient documentation

## 2017-04-18 DIAGNOSIS — R932 Abnormal findings on diagnostic imaging of liver and biliary tract: Secondary | ICD-10-CM | POA: Diagnosis not present

## 2017-04-18 DIAGNOSIS — Z888 Allergy status to other drugs, medicaments and biological substances status: Secondary | ICD-10-CM | POA: Insufficient documentation

## 2017-04-18 HISTORY — DX: Unspecified jaundice: R17

## 2017-04-18 LAB — CBC WITH DIFFERENTIAL/PLATELET
Basophils Absolute: 0.2 10*3/uL — ABNORMAL HIGH (ref 0–0.1)
Basophils Relative: 2 %
EOS PCT: 2 %
Eosinophils Absolute: 0.2 10*3/uL (ref 0–0.7)
HEMATOCRIT: 37.1 % (ref 35.0–47.0)
Hemoglobin: 13 g/dL (ref 12.0–16.0)
LYMPHS PCT: 30 %
Lymphs Abs: 2.1 10*3/uL (ref 1.0–3.6)
MCH: 30.2 pg (ref 26.0–34.0)
MCHC: 35.1 g/dL (ref 32.0–36.0)
MCV: 85.9 fL (ref 80.0–100.0)
Monocytes Absolute: 0.7 10*3/uL (ref 0.2–0.9)
Monocytes Relative: 11 %
NEUTROS ABS: 3.8 10*3/uL (ref 1.4–6.5)
Neutrophils Relative %: 55 %
PLATELETS: 266 10*3/uL (ref 150–440)
RBC: 4.32 MIL/uL (ref 3.80–5.20)
RDW: 21.7 % — ABNORMAL HIGH (ref 11.5–14.5)
WBC: 7 10*3/uL (ref 3.6–11.0)

## 2017-04-18 LAB — HEPATIC FUNCTION PANEL
ALK PHOS: 196 U/L — AB (ref 38–126)
ALT: 322 U/L — AB (ref 14–54)
AST: 320 U/L — ABNORMAL HIGH (ref 15–41)
Albumin: 2.8 g/dL — ABNORMAL LOW (ref 3.5–5.0)
BILIRUBIN DIRECT: 7.6 mg/dL — AB (ref 0.1–0.5)
BILIRUBIN INDIRECT: 6.7 mg/dL — AB (ref 0.3–0.9)
Total Bilirubin: 14.3 mg/dL — ABNORMAL HIGH (ref 0.3–1.2)
Total Protein: 5.9 g/dL — ABNORMAL LOW (ref 6.5–8.1)

## 2017-04-18 LAB — COMPREHENSIVE METABOLIC PANEL
ALT: 372 U/L — ABNORMAL HIGH (ref 14–54)
ANION GAP: 11 (ref 5–15)
AST: 407 U/L — AB (ref 15–41)
Albumin: 3.1 g/dL — ABNORMAL LOW (ref 3.5–5.0)
Alkaline Phosphatase: 215 U/L — ABNORMAL HIGH (ref 38–126)
BILIRUBIN TOTAL: 15.7 mg/dL — AB (ref 0.3–1.2)
BUN: 6 mg/dL (ref 6–20)
CO2: 28 mmol/L (ref 22–32)
Calcium: 8.7 mg/dL — ABNORMAL LOW (ref 8.9–10.3)
Chloride: 98 mmol/L — ABNORMAL LOW (ref 101–111)
Creatinine, Ser: <0.3 mg/dL — ABNORMAL LOW (ref 0.44–1.00)
GLUCOSE: 103 mg/dL — AB (ref 65–99)
POTASSIUM: 3.3 mmol/L — AB (ref 3.5–5.1)
SODIUM: 137 mmol/L (ref 135–145)
TOTAL PROTEIN: 6.4 g/dL — AB (ref 6.5–8.1)

## 2017-04-18 LAB — CBC
HCT: 36.7 % (ref 36.0–46.0)
Hemoglobin: 12.8 g/dL (ref 12.0–15.0)
MCH: 29.4 pg (ref 26.0–34.0)
MCHC: 34.9 g/dL (ref 30.0–36.0)
MCV: 84.2 fL (ref 78.0–100.0)
PLATELETS: 289 10*3/uL (ref 150–400)
RBC: 4.36 MIL/uL (ref 3.87–5.11)
RDW: 23 % — ABNORMAL HIGH (ref 11.5–15.5)
WBC: 8.4 10*3/uL (ref 4.0–10.5)

## 2017-04-18 LAB — LIPASE, BLOOD: Lipase: 32 U/L (ref 11–51)

## 2017-04-18 LAB — BILIRUBIN, DIRECT: Bilirubin, Direct: 8.3 mg/dL — ABNORMAL HIGH (ref 0.1–0.5)

## 2017-04-18 LAB — LACTIC ACID, PLASMA: Lactic Acid, Venous: 1.9 mmol/L (ref 0.5–1.9)

## 2017-04-18 LAB — PHOSPHORUS: Phosphorus: 4.3 mg/dL (ref 2.5–4.6)

## 2017-04-18 LAB — CREATININE, SERUM
CREATININE: 0.54 mg/dL (ref 0.44–1.00)
GFR calc Af Amer: >60 mL/min (ref >60)

## 2017-04-18 MED ORDER — MOMETASONE FURO-FORMOTEROL FUM 200-5 MCG/ACT IN AERO
2.0000 | INHALATION_SPRAY | Freq: Two times a day (BID) | RESPIRATORY_TRACT | Status: DC
  Administered 2017-04-18 – 2017-04-19 (×2): 2 via RESPIRATORY_TRACT
  Filled 2017-04-18: qty 8.8

## 2017-04-18 MED ORDER — ALBUTEROL SULFATE (2.5 MG/3ML) 0.083% IN NEBU: 3.0000 mL | INHALATION_SOLUTION | RESPIRATORY_TRACT | Status: DC | PRN

## 2017-04-18 MED ORDER — MONTELUKAST SODIUM 10 MG PO TABS
10.0000 mg | ORAL_TABLET | Freq: Every day | ORAL | Status: DC
  Filled 2017-04-18: qty 1

## 2017-04-18 MED ORDER — SODIUM CHLORIDE 0.9% FLUSH
3.0000 mL | Freq: Two times a day (BID) | INTRAVENOUS | Status: DC
Start: 2017-04-18 — End: 2017-04-19

## 2017-04-18 MED ORDER — ENSURE ENLIVE PO LIQD: 237.0000 mL | Freq: Two times a day (BID) | ORAL | Status: DC

## 2017-04-18 MED ORDER — METRONIDAZOLE IN NACL 5-0.79 MG/ML-% IV SOLN
500.0000 mg | Freq: Once | INTRAVENOUS | Status: AC
  Administered 2017-04-18: 500 mg via INTRAVENOUS
  Filled 2017-04-18: qty 100

## 2017-04-18 MED ORDER — NICOTINE 14 MG/24HR TD PT24
14.0000 mg | MEDICATED_PATCH | Freq: Every day | TRANSDERMAL | Status: DC
  Filled 2017-04-18 (×2): qty 1

## 2017-04-18 MED ORDER — SODIUM CHLORIDE 0.9% FLUSH: 3.0000 mL | INTRAVENOUS | Status: DC | PRN

## 2017-04-18 MED ORDER — HEPARIN SODIUM (PORCINE) 5000 UNIT/ML IJ SOLN
5000.0000 [IU] | Freq: Three times a day (TID) | INTRAMUSCULAR | Status: DC
  Filled 2017-04-18: qty 1

## 2017-04-18 MED ORDER — CEFTRIAXONE SODIUM IN DEXTROSE 20 MG/ML IV SOLN
1.0000 g | Freq: Once | INTRAVENOUS | Status: AC
  Administered 2017-04-18: 1 g via INTRAVENOUS
  Filled 2017-04-18: qty 50

## 2017-04-18 MED ORDER — TIOTROPIUM BROMIDE MONOHYDRATE 18 MCG IN CAPS
18.0000 ug | ORAL_CAPSULE | Freq: Every day | RESPIRATORY_TRACT | Status: DC
  Administered 2017-04-18: 18 ug via RESPIRATORY_TRACT
  Filled 2017-04-18: qty 5

## 2017-04-18 MED ORDER — SODIUM CHLORIDE 0.9 % IV BOLUS (SEPSIS)
1000.0000 mL | Freq: Once | INTRAVENOUS | Status: AC
  Administered 2017-04-18: 1000 mL via INTRAVENOUS

## 2017-04-18 MED ORDER — SODIUM CHLORIDE 0.9 % IV SOLN: 250.0000 mL | INTRAVENOUS | Status: DC | PRN

## 2017-04-18 NOTE — Consult Note (Signed)
Patient ID: Margaret Stevenson, female   DOB: 10-Mar-1946, 71 y.o.   MRN: [id number]  CC: Jaundice   HPI Margaret Stevenson is a 71 y.o. female who presents emergency department for continued workup of recent jaundice. Patient noted jaundice for at least 3 days now. Was seen as an outpatient by her primary care who started workup for the jaundice. The workup had findings concerning for possible cholangitis and she was informed to report to emergency department. Patient reports that she is unsure exactly how long she's been jaundiced but was told at least 3 days ago that she started looking yellow. She denies any abdominal pain. She has been eating well. She gives a vague history of nausea and vomiting area approximately once a week for the last 3 weeks. None the last several days. She was also having diarrhea this resolved greater than 24 hours ago now. She denies any fevers, chills, chest pain. She does have chronic shortness of breath secondary to COPD with oxygen usage. Otherwise she states she is in her usual state of health.  HPI  Past Medical History:  Diagnosis Date  . Cancer ([place])    melanoma 07/2013  . COPD (chronic obstructive pulmonary disease) ([place])     Past Surgical History:  Procedure Laterality Date  . TONSILLECTOMY      Family History  Problem Relation Age of Onset  . Breast cancer Mother 30    Social History Social History  Substance Use Topics  . Smoking status: Current Every Day Smoker    Packs/day: 2.00    Types: Cigarettes  . Smokeless tobacco: Never Used  . Alcohol use No    Allergies  Allergen Reactions  . Penicillins Hives    Has patient had a PCN reaction causing immediate rash, facial/tongue/throat swelling, SOB or lightheadedness with hypotension: no Has patient had a PCN reaction causing severe rash involving mucus membranes or skin necrosis: no Has patient had a PCN reaction that required hospitalization no Has patient had a PCN reaction occurring  within the last 10 years: no If all of the above answers are "NO", then may proceed with Cephalosporin use.     No current facility-administered medications for this encounter.    Current Outpatient Prescriptions  Medication Sig Dispense Refill  . albuterol (VENTOLIN HFA) 108 (90 Base) MCG/ACT inhaler Inhale 2 puffs into the lungs every 4 (four) hours as needed.     . Fluticasone-Salmeterol (ADVAIR) 250-50 MCG/DOSE AEPB Inhale 1 puff into the lungs 2 (two) times daily.    Marland Kitchen tiotropium (SPIRIVA) 18 MCG inhalation capsule Place 18 mcg into inhaler and inhale at bedtime.     . ALPRAZolam (XANAX) 1 MG tablet Take 1 tablet (1 mg total) by mouth 3 (three) times daily as needed for anxiety. (Patient not taking: Reported on 04/18/2017) 15 tablet 0  . benzonatate (TESSALON) 100 MG capsule Take 1 capsule (100 mg total) by mouth 2 (two) times daily. (Patient not taking: Reported on 04/18/2017) 20 capsule 0  . bisacodyl (DULCOLAX) 5 MG EC tablet Take 1 tablet (5 mg total) by mouth daily as needed for moderate constipation. (Patient not taking: Reported on 04/18/2017) 30 tablet 0  . guaiFENesin (MUCINEX) 600 MG 12 hr tablet Take 1 tablet (600 mg total) by mouth 2 (two) times daily as needed. (Patient not taking: Reported on 04/18/2017) 15 tablet 0  . ipratropium-albuterol (DUONEB) 0.5-2.5 (3) MG/3ML SOLN Take 3 mLs by nebulization every 4 (four) hours. (Patient not taking: Reported on 04/18/2017)  360 mL 1  . mometasone-formoterol (DULERA) 200-5 MCG/ACT AERO Inhale 2 puffs into the lungs 2 (two) times daily. (Patient not taking: Reported on 04/18/2017) 1 Inhaler 1  . montelukast (SINGULAIR) 10 MG tablet Take 1 tablet (10 mg total) by mouth at bedtime. (Patient not taking: Reported on 04/18/2017) 30 tablet 1  . predniSONE (DELTASONE) 10 MG tablet Take 6 tablets (60 mg total) by mouth daily with breakfast. Taper by 10 mg daily then stop (Patient not taking: Reported on 04/18/2017) 21 tablet 0     Review of Systems A  Multi-point review of systems was asked and was negative except for the findings documented in the history of present illness  Physical Exam Blood pressure (!) 154/69, pulse (!) 105, temperature 98.4 F (36.9 C), temperature source Oral, resp. rate 16, height 5\' 8"  (1.727 m), weight 64 kg (141 lb), SpO2 94 %. CONSTITUTIONAL: No acute distress. EYES: Pupils are equal, round, and reactive to light, Sclera are obviously jaundiced. EARS, NOSE, MOUTH AND THROAT: The oropharynx is clear. The oral mucosa is pink and moist. Hearing is intact to voice. LYMPH NODES:  Lymph nodes in the neck are normal. RESPIRATORY:  Lungs are clear. There is normal respiratory effort, with equal breath sounds bilaterally, and without pathologic use of accessory muscles. CARDIOVASCULAR: Heart is regular without murmurs, gallops, or rubs. GI: The abdomen is soft, nontender, and nondistended. There are no palpable masses. There is no hepatosplenomegaly. There are normal bowel sounds in all quadrants. GU: Rectal deferred.   MUSCULOSKELETAL: Normal muscle strength and tone. No cyanosis or edema.   SKIN: Turgor is good and there are no pathologic skin lesions or ulcers. NEUROLOGIC: Motor and sensation is grossly normal. Cranial nerves are grossly intact. PSYCH:  Oriented to person, place and time. Affect is normal.  Data Reviewed Images and labs reviewed. Significantly labs show a total bilirubin of 15.7, alkaline phosphatase 2:15, AST of 47, ALT of 372. Normal white blood cell count of 7.0. Ultrasound interpreted as possible cholecystitis with a visible gallstone and pedicles cystic fluid. No ductal dilatation. CT scan was performed as an outpatient yesterday interpreted as possible cholangitis with inflammation of the ductal system without an obvious mass seen. I have personally reviewed the patient's imaging, laboratory findings and medical records.    Assessment    Painless jaundice    Plan    71 year old female  with painless jaundice. Reviewed the patient's findings with her in detail. Discussed given her lack of other symptoms that an ERCP would be warranted for further workup. Discussed the primary possible diagnoses are of cholangitis or of cancer. Given her lack of fever or chills it is difficult to say this is from an inflammatory process. Patient appeared to voice understanding. After discussing with ER physician also care the patient was discovered that we do not currently have GI call coverage at this facility. Discussed the importance of prompt ERCP with the patient and she voiced understanding. The ER will facilitate transfer, [facility] to [facility]. No current surgical plans, should this turn out to be cholangitis and ERCP would be therapeutic and a cholecystectomy could be performed in the future if deemed necessary. Should this be cancer ERCP should be palliative as well as diagnostic to allow relief of the obstruction.     Time spent with the patient was 60 minutes, with more than 50% of the time spent in face-to-face education, counseling and care coordination.     Clayburn Pert, MD [facility] General Surgeon  04/18/2017, 11:58 AM

## 2017-04-18 NOTE — ED Provider Notes (Signed)
[facility] Emergency Department Provider Note  ____________________________________________  Time seen: Approximately 9:56 AM  I have reviewed the triage vital signs and the nursing notes.   HISTORY  Chief Complaint Abnormal Lab   HPI Margaret Stevenson is a 71 y.o. female with a history of COPD on 3L  and melanoma who presents to the emergency room for elevated bilirubin. Patient went to see her doctor yesterday for 7 days of constant and worsening painless jaundice, dark tea-colored urine, and tan colored stools.PCP sent her for CT scan that was concerning for cholangitis and acute cholecystitis. Blood work done by the PCP showed elevated LFTs in the 2-300, T bili of 15, normal white count. PCP called patient and told her to come to the emergency room. Patient denies unintentional weight loss, family history pancreatic cancer, abdominal pain, nausea, vomiting, fever, chills. She drinks 2 glasses of wine a evening. Denies any prior history of hepatitis.  Past Medical History:  Diagnosis Date  . Cancer ([place])    melanoma 07/2013  . COPD (chronic obstructive pulmonary disease) [facility])     Patient Active Problem List   Diagnosis Date Noted  . Jaundice   . Hypoxia 10/11/2016  . Anxiety 10/11/2016  . Acute respiratory failure ([place]) 10/06/2016  . COPD exacerbation ([place]) 10/06/2016  . CAP (community acquired pneumonia) 10/06/2016  . Hyperglycemia 10/06/2016    Past Surgical History:  Procedure Laterality Date  . TONSILLECTOMY      Prior to Admission medications   Medication Sig Start Date End Date Taking? Authorizing Provider  albuterol (VENTOLIN HFA) 108 (90 Base) MCG/ACT inhaler Inhale 2 puffs into the lungs every 4 (four) hours as needed.  08/27/16 08/27/17 Yes [provider]  Fluticasone-Salmeterol (ADVAIR) 250-50 MCG/DOSE AEPB Inhale 1 puff into the lungs 2 (two) times daily.   Yes [provider]  tiotropium (SPIRIVA) 18 MCG  inhalation capsule Place 18 mcg into inhaler and inhale at bedtime.  08/27/16 08/27/17 Yes [provider]  ALPRAZolam Duanne Moron) 1 MG tablet Take 1 tablet (1 mg total) by mouth 3 (three) times daily as needed for anxiety. Patient not taking: Reported on 04/18/2017 10/18/16   Demetrios Loll, MD  benzonatate (TESSALON) 100 MG capsule Take 1 capsule (100 mg total) by mouth 2 (two) times daily. Patient not taking: Reported on 04/18/2017 10/18/16   Demetrios Loll, MD  bisacodyl (DULCOLAX) 5 MG EC tablet Take 1 tablet (5 mg total) by mouth daily as needed for moderate constipation. Patient not taking: Reported on 04/18/2017 10/18/16   Demetrios Loll, MD  guaiFENesin (MUCINEX) 600 MG 12 hr tablet Take 1 tablet (600 mg total) by mouth 2 (two) times daily as needed. Patient not taking: Reported on 04/18/2017 10/10/16   Fritzi Mandes, MD  ipratropium-albuterol (DUONEB) 0.5-2.5 (3) MG/3ML SOLN Take 3 mLs by nebulization every 4 (four) hours. Patient not taking: Reported on 04/18/2017 10/10/16   Fritzi Mandes, MD  mometasone-formoterol [facility]) 200-5 MCG/ACT AERO Inhale 2 puffs into the lungs 2 (two) times daily. Patient not taking: Reported on 04/18/2017 10/10/16   Fritzi Mandes, MD  montelukast (SINGULAIR) 10 MG tablet Take 1 tablet (10 mg total) by mouth at bedtime. Patient not taking: Reported on 04/18/2017 10/10/16   Fritzi Mandes, MD  predniSONE (DELTASONE) 10 MG tablet Take 6 tablets (60 mg total) by mouth daily with breakfast. Taper by 10 mg daily then stop Patient not taking: Reported on 04/18/2017 10/10/16   Fritzi Mandes, MD    Allergies Penicillins  Family History  Problem Relation Age of Onset  . Breast cancer Mother 30    Social History Social History  Substance Use Topics  . Smoking status: Current Every Day Smoker    Packs/day: 2.00    Types: Cigarettes  . Smokeless tobacco: Never Used  . Alcohol use No    Review of Systems  Constitutional: Negative for fever. Eyes: Negative for visual changes. +  yellow eyes ENT: Negative for sore throat. Neck: No neck pain  Cardiovascular: Negative for chest pain. Respiratory: Negative for shortness of breath. Gastrointestinal: Negative for abdominal pain, vomiting or diarrhea. Genitourinary: Negative for dysuria. Musculoskeletal: Negative for back pain. Skin: Negative for rash. + yellow skin Neurological: Negative for headaches, weakness or numbness. Psych: No SI or HI  ____________________________________________   PHYSICAL EXAM:  VITAL SIGNS: ED Triage Vitals [04/18/17 0739]  Enc Vitals Group     BP (!) 154/69     Pulse Rate (!) 105     Resp 16     Temp 98.4 F (36.9 C)     Temp Source Oral     SpO2 94 %     Weight 141 lb (64 kg)     Height 5\' 8"  (1.727 m)     Head Circumference      Peak Flow      Pain Score      Pain Loc      Pain Edu?      Excl. in [place]?     Constitutional: Alert and oriented. Well appearing and in no apparent distress. HEENT:      Head: Normocephalic and atraumatic.         Eyes: Conjunctivae are normal. Sclera is icteric. EOMI. PERRL      Mouth/Throat: Mucous membranes are moist.       Neck: Supple with no signs of meningismus. Cardiovascular: Regular rate and rhythm. No murmurs, gallops, or rubs. 2+ symmetrical distal pulses are present in all extremities. No JVD. Respiratory: Normal respiratory effort. Lungs are clear to auscultation bilaterally. No wheezes, crackles, or rhonchi.  Gastrointestinal: Soft, non tender, and non distended with positive bowel sounds. No rebound or guarding. Genitourinary: No CVA tenderness. Musculoskeletal: Nontender with normal range of motion in all extremities. No edema, cyanosis, or erythema of extremities. Neurologic: Normal speech and language. Face is symmetric. Moving all extremities. No gross focal neurologic deficits are appreciated. Skin: Skin is jaundiced. Psychiatric: Mood and affect are normal. Speech and behavior are  normal.  ____________________________________________   LABS (all labs ordered are listed, but only abnormal results are displayed)  Labs Reviewed  CBC WITH DIFFERENTIAL/PLATELET - Abnormal; Notable for the following:       Result Value   RDW 21.7 (*)    Basophils Absolute 0.2 (*)    All other components within normal limits  COMPREHENSIVE METABOLIC PANEL - Abnormal; Notable for the following:    Potassium 3.3 (*)    Chloride 98 (*)    Glucose, Bld 103 (*)    Creatinine, Ser <0.30 (*)    Calcium 8.7 (*)    Total Protein 6.4 (*)    Albumin 3.1 (*)    AST 407 (*)    ALT 372 (*)    Alkaline Phosphatase 215 (*)    Total Bilirubin 15.7 (*)    All other components within normal limits  BILIRUBIN, DIRECT - Abnormal; Notable for the following:    Bilirubin, Direct 8.3 (*)    All other components within normal limits  CULTURE,  BLOOD (ROUTINE X 2)  CULTURE, BLOOD (ROUTINE X 2)  LACTIC ACID, PLASMA  LIPASE, BLOOD   ____________________________________________  EKG  none ____________________________________________  RADIOLOGY  RUQ [place]:  2 cm gallstone with wall thickening of the lower aspect of the gallbladder and a small amount of associated pericholecystic fluid. In the proper clinical setting, early acute cholecystitis should be suspected. 2. No bile duct dilation. 3. Liver appearance is consistent with best seen on the previous day's CT scan. Echogenic portal triads suggests hepatic inflammation. Hepatic inflammation may be the source of the pericholecystic fluid and cause reactive wall thickening, as an alternative to acute cholecystitis. ____________________________________________   PROCEDURES  Procedure(s) performed: None Procedures Critical Care performed:  None ____________________________________________   INITIAL IMPRESSION / ASSESSMENT AND PLAN / ED COURSE  72 y.o. female with a history of COPD and melanoma who presents to the emergency room for  evaluation of painless jaundice for 7 days. Had a CT scan yesterday by PCP concerning for ascending cholangitis and cholecystitis however patient has no abdominal pain, no fever, no nausea or vomiting. Her abdomen is soft and nontender. Her vitals show mildly tachycardia with a pulse of 105 otherwise afebrile. We'll pursue a right upper quadrant ultrasound, start patient on antibiotics, recheck her blood work and discuss with GI and surgery based on results of [place].  Clinical Course as of Apr 18 1541  Fri Apr 18, 2017  1006 Ultrasound concerning for acute cholecystitis. Discussed with Dr. Adonis Huguenin who will evaluate the patient in the ED  [CV]  1323 Spoke with Dr. Adonis Huguenin who evaluated the patient and is concerned that she might need ERCP since patient has a direct hyperbilirubinemia. I spoke with Dr.Hayes, GI at [facility] who agrees to evaluate patient. Discussed with Dr. Aggie Moats, hospitalist at [facility] who accepted patient for transfer  [CV]    Clinical Course User Index [CV] Rudene Re, MD    Pertinent labs & imaging results that were available during my care of the patient were reviewed by me and considered in my medical decision making (see chart for details).    ____________________________________________   FINAL CLINICAL IMPRESSION(S) / ED DIAGNOSES  Final diagnoses:  RUQ abdominal pain  Acute cholecystitis  Jaundice      NEW MEDICATIONS STARTED DURING THIS VISIT:  New Prescriptions   No medications on file     Note:  This document was prepared using Dragon voice recognition software and may include unintentional dictation errors.    Alfred Levins, [place], MD 04/18/17 [phone]

## 2017-04-18 NOTE — Progress Notes (Signed)
[facility] notified about consult, called back and will see patient in AM.

## 2017-04-18 NOTE — ED Triage Notes (Signed)
States she was sent by her PCP after an abnormal CT scan and blood work yesterday, states she had the testing done due to yellow skin and gold urine for 7 days, denies any pain, pt on 3L [place] chronically

## 2017-04-18 NOTE — H&P (Signed)
History and Physical    Margaret Stevenson HUT:[id number] DOB: Feb 01, 1946 DOA: 04/18/2017  PCP: Dion Body, MD  Patient coming from: home  Chief Complaint: painless jaundice  HPI: Margaret Stevenson is a 71 y.o. female with medical history significant of copd and nicotine dependence. Presenting with painless jaundice. Problem has been persistent. Nothing she is aware of makes it better or worse. No abdominal pain associated with the problem. Has noticed discoloration of urine and feces. Was made aware of the problems starting yesterday.   Per sign out patient transitioned here for possible ERCP by GI specialist who accepted.  Review of Systems: As per HPI otherwise 10 point review of systems negative.    Past Medical History:  Diagnosis Date  . Cancer ([place])    melanoma 07/2013  . COPD (chronic obstructive pulmonary disease) ([place])     Past Surgical History:  Procedure Laterality Date  . TONSILLECTOMY       reports that she has been smoking Cigarettes.  She has been smoking about 2.00 packs per day. She has never used smokeless tobacco. She reports that she does not drink alcohol or use drugs.  Allergies  Allergen Reactions  . Penicillins Hives    Has patient had a PCN reaction causing immediate rash, facial/tongue/throat swelling, SOB or lightheadedness with hypotension: no Has patient had a PCN reaction causing severe rash involving mucus membranes or skin necrosis: no Has patient had a PCN reaction that required hospitalization no Has patient had a PCN reaction occurring within the last 10 years: no If all of the above answers are "NO", then may proceed with Cephalosporin use.     Family History  Problem Relation Age of Onset  . Breast cancer Mother 107    Prior to Admission medications   Medication Sig Start Date End Date Taking? Authorizing Provider  albuterol (VENTOLIN HFA) 108 (90 Base) MCG/ACT inhaler Inhale 2 puffs into the lungs every 4 (four) hours as  needed.  08/27/16 08/27/17  [provider]  ALPRAZolam Duanne Moron) 1 MG tablet Take 1 tablet (1 mg total) by mouth 3 (three) times daily as needed for anxiety. Patient not taking: Reported on 04/18/2017 10/18/16   Demetrios Loll, MD  benzonatate (TESSALON) 100 MG capsule Take 1 capsule (100 mg total) by mouth 2 (two) times daily. Patient not taking: Reported on 04/18/2017 10/18/16   Demetrios Loll, MD  bisacodyl (DULCOLAX) 5 MG EC tablet Take 1 tablet (5 mg total) by mouth daily as needed for moderate constipation. Patient not taking: Reported on 04/18/2017 10/18/16   Demetrios Loll, MD  Fluticasone-Salmeterol (ADVAIR) 250-50 MCG/DOSE AEPB Inhale 1 puff into the lungs 2 (two) times daily.    [provider]  guaiFENesin (MUCINEX) 600 MG 12 hr tablet Take 1 tablet (600 mg total) by mouth 2 (two) times daily as needed. Patient not taking: Reported on 04/18/2017 10/10/16   Fritzi Mandes, MD  ipratropium-albuterol (DUONEB) 0.5-2.5 (3) MG/3ML SOLN Take 3 mLs by nebulization every 4 (four) hours. Patient not taking: Reported on 04/18/2017 10/10/16   Fritzi Mandes, MD  mometasone-formoterol [facility]) 200-5 MCG/ACT AERO Inhale 2 puffs into the lungs 2 (two) times daily. Patient not taking: Reported on 04/18/2017 10/10/16   Fritzi Mandes, MD  montelukast (SINGULAIR) 10 MG tablet Take 1 tablet (10 mg total) by mouth at bedtime. Patient not taking: Reported on 04/18/2017 10/10/16   Fritzi Mandes, MD  predniSONE (DELTASONE) 10 MG tablet Take 6 tablets (60 mg total) by mouth daily with breakfast. Taper  by 10 mg daily then stop Patient not taking: Reported on 04/18/2017 10/10/16   Fritzi Mandes, MD  tiotropium (SPIRIVA) 18 MCG inhalation capsule Place 18 mcg into inhaler and inhale at bedtime.  08/27/16 08/27/17  [provider]    Physical Exam: Vitals:   04/18/17 1659  BP: (!) 147/65  Pulse: 93  Resp: 18  Temp: 98.3 F (36.8 C)  TempSrc: Oral  SpO2: 93%    Constitutional: NAD, calm, comfortable Vitals:    04/18/17 1659  BP: (!) 147/65  Pulse: 93  Resp: 18  Temp: 98.3 F (36.8 C)  TempSrc: Oral  SpO2: 93%   Eyes: PERRL, lids normal,  Scleral icterus ENMT: Mucous membranes are moist. Posterior pharynx clear of any exudate or lesions. Neck: normal, supple, no masses, no thyromegaly Respiratory: clear to auscultation bilaterally, no wheezing, no crackles. Normal respiratory effort. No accessory muscle use.  Cardiovascular: Regular rate and rhythm, no murmurs / rubs / gallops. No extremity edema.  Abdomen: no tenderness, no masses palpated.  Bowel sounds positive.  Musculoskeletal: no clubbing / cyanosis. No joint deformity upper and lower extremities. Good ROM, no contractures. Normal muscle tone.  Skin: no rashes, lesions, ulcers. No induration + jaundiced Neurologic: CN 3-12 grossly intact. Sensation intact, DTR normal. Strength 5/5 in all 4.  Psychiatric:  Alert and oriented x 3. Normal mood.    Labs on Admission: I have personally reviewed following labs and imaging studies  CBC:  Recent Labs Lab 04/18/17 0805  WBC 7.0  NEUTROABS 3.8  HGB 13.0  HCT 37.1  MCV 85.9  PLT 366   Basic Metabolic Panel:  Recent Labs Lab 04/17/17 1758 04/18/17 0805  NA  --  137  K  --  3.3*  CL  --  98*  CO2  --  28  GLUCOSE  --  103*  BUN  --  6  CREATININE 0.50 <0.30*  CALCIUM  --  8.7*   GFR: CrCl cannot be calculated (This lab value cannot be used to calculate CrCl because it is not a number: <0.30). Liver Function Tests:  Recent Labs Lab 04/18/17 0805  AST 407*  ALT 372*  ALKPHOS 215*  BILITOT 15.7*  PROT 6.4*  ALBUMIN 3.1*    Recent Labs Lab 04/18/17 0805  LIPASE 32   No results for input(s): AMMONIA in the last 168 hours. Coagulation Profile: No results for input(s): INR, PROTIME in the last 168 hours. Cardiac Enzymes: No results for input(s): CKTOTAL, CKMB, CKMBINDEX, TROPONINI in the last 168 hours. BNP (last 3 results) No results for input(s): PROBNP in the  last 8760 hours. HbA1C: No results for input(s): HGBA1C in the last 72 hours. CBG: No results for input(s): GLUCAP in the last 168 hours. Lipid Profile: No results for input(s): CHOL, HDL, LDLCALC, TRIG, CHOLHDL, LDLDIRECT in the last 72 hours. Thyroid Function Tests: No results for input(s): TSH, T4TOTAL, FREET4, T3FREE, THYROIDAB in the last 72 hours. Anemia Panel: No results for input(s): VITAMINB12, FOLATE, FERRITIN, TIBC, IRON, RETICCTPCT in the last 72 hours. Urine analysis: No results found for: COLORURINE, APPEARANCEUR, [place], [place], GLUCOSEU, HGBUR, BILIRUBINUR, KETONESUR, PROTEINUR, UROBILINOGEN, NITRITE, LEUKOCYTESUR  Radiological Exams on Admission: Ct Abdomen Pelvis W Contrast  Result Date: 04/17/2017 CLINICAL DATA:  Acute onset of jaundice and lack of appetite. Vomiting. Initial encounter. EXAM: CT ABDOMEN AND PELVIS WITH CONTRAST TECHNIQUE: Multidetector CT imaging of the abdomen and pelvis was performed using the standard protocol following bolus administration of intravenous contrast. CONTRAST:  137mL ISOVUE-300  IOPAMIDOL (ISOVUE-300) INJECTION 61% COMPARISON:  None. FINDINGS: Lower chest: Bibasilar atelectasis or scarring is noted. The visualized portions of the mediastinum are unremarkable. Hepatobiliary: The liver is grossly unremarkable in appearance. There is diffuse soft tissue inflammation about the porta hepatis, with pericholecystic fluid and mild gallbladder wall edema, concerning for acute cholecystitis. The gallbladder is somewhat distended. There appear to be stones lodged at the neck of the gallbladder. The common bile duct appears normal in caliber. A mildly prominent 1.2 cm node is noted anterior to the infrahepatic IVC. Pancreas: The pancreas is within normal limits. Spleen: The spleen is unremarkable in appearance. Adrenals/Urinary Tract: The adrenal glands are unremarkable in appearance. Mild right renal scarring is noted. The kidneys are otherwise  unremarkable. There is no evidence of hydronephrosis. No renal or ureteral stones are identified. No significant perinephric stranding is appreciated. Stomach/Bowel: The stomach is unremarkable in appearance. The small bowel is within normal limits. The appendix is normal in caliber, without evidence of appendicitis. Vague segmental wall thickening is noted at the distal transverse colon. An underlying mass cannot be entirely excluded. Scattered diverticulosis is noted along the sigmoid colon, without evidence of diverticulitis. Vascular/Lymphatic: Scattered calcification is seen along the abdominal aorta and its branches. The abdominal aorta is otherwise grossly unremarkable. The inferior vena cava is grossly unremarkable. No retroperitoneal lymphadenopathy is seen. No pelvic sidewall lymphadenopathy is identified. Reproductive: The bladder is mildly distended and within normal limits. The uterus is grossly unremarkable in appearance. The ovaries are relatively symmetric. No suspicious adnexal masses are seen. Other: No additional soft tissue abnormalities are seen. Musculoskeletal: No acute osseous abnormalities are identified. Multilevel vacuum phenomenon is noted along the lumbar spine, with underlying endplate sclerotic change. The visualized musculature is unremarkable in appearance. IMPRESSION: 1. Diffuse soft tissue inflammation about the porta hepatis, likely reflecting cholangitis, with mild gallbladder wall edema and pericholecystic fluid, concerning for acute cholecystitis. Gallbladder somewhat distended in appearance. Apparent stones lodged at the neck of the gallbladder. Mildly prominent adjacent node noted. Underlying mass is considered unlikely, but cannot be entirely excluded. Would correlate with the chronicity of the patient's symptoms. 2. Vague segmental wall thickening at the distal transverse colon. Underlying mass cannot be entirely excluded, though this may remain within normal limits.  Colonoscopy would be helpful for further evaluation, when and as deemed clinically appropriate. 3. Scattered diverticulosis along the sigmoid colon, without evidence of diverticulitis. 4. Mild right renal scarring noted. 5. Scattered aortic atherosclerosis. 6. Mild degenerative change along the lumbar spine. 7. Atelectasis or scarring noted at the lung bases. Electronically Signed   By: Garald Balding M.D.   On: 04/17/2017 18:46   US Abdomen Limited Ruq  Result Date: 04/18/2017 CLINICAL DATA:  Right upper quadrant abdominal pain. EXAM: US ABDOMEN LIMITED - RIGHT UPPER QUADRANT COMPARISON:  CT, 04/17/2017 FINDINGS: Gallbladder: 2 cm stone lies in the gallbladder neck. There is mild wall thickening of the lower aspect of the gallbladder, with the wall measuring 5 mm, associated with a small amount of adjacent pericholecystic fluid. Wall of the middle to upper gallbladder appears normal in thickness. Common bile duct: Diameter: 2.8 mm Liver: Liver demonstrates somewhat echogenic portal triads consistent with the periportal hypoattenuation noted on CT. This can be seen in the setting of hepatic inflammation but is nonspecific. Liver is normal in overall size. No liver mass or focal lesion. IMPRESSION: 1. 2 cm gallstone with wall thickening of the lower aspect of the gallbladder and a small amount of associated pericholecystic fluid.  In the proper clinical setting, early acute cholecystitis should be suspected. 2. No bile duct dilation. 3. Liver appearance is consistent with best seen on the previous day's CT scan. Echogenic portal triads suggests hepatic inflammation. Hepatic inflammation may be the source of the pericholecystic fluid and cause reactive wall thickening, as an alternative to acute cholecystitis. Electronically Signed   By: Lajean Manes M.D.   On: 04/18/2017 09:53    EKG: Independently reviewed. Sinus rhythm with no ST elevations or depressions  Assessment/Plan Active Problems:   Jaundice -  Per sign out Dr. Amedeo Plenty aware and will further evaluate - will obtain hepatic function panel - defer choice of imaging study to specialist.  Nicotine dependence - Place on nicotine patch  COPD - Continue maintenance medication regimen  DVT prophylaxis: Heparin Code Status: full Family Communication: d/c patient directly Disposition Plan: pending work up recommendations by specialist Consults called: Dr Samara Deist ([facility]) per sign out Admission status: obs   Velvet Bathe MD [facility] Pager [phone]  If 7PM-7AM, please contact night-coverage [email or web address] Password [facility]  04/18/2017, 5:29 PM

## 2017-04-19 ENCOUNTER — Observation Stay (HOSPITAL_COMMUNITY): Payer: Medicare Other

## 2017-04-19 DIAGNOSIS — J449 Chronic obstructive pulmonary disease, unspecified: Secondary | ICD-10-CM

## 2017-04-19 DIAGNOSIS — R17 Unspecified jaundice: Secondary | ICD-10-CM | POA: Diagnosis not present

## 2017-04-19 LAB — BASIC METABOLIC PANEL
Anion gap: 9 (ref 5–15)
BUN: 5 mg/dL — ABNORMAL LOW (ref 6–20)
CALCIUM: 8.5 mg/dL — AB (ref 8.9–10.3)
CO2: 28 mmol/L (ref 22–32)
CREATININE: 0.47 mg/dL (ref 0.44–1.00)
Chloride: 101 mmol/L (ref 101–111)
GFR calc non Af Amer: >60 mL/min (ref >60)
GLUCOSE: 90 mg/dL (ref 65–99)
Potassium: 3.6 mmol/L (ref 3.5–5.1)
Sodium: 138 mmol/L (ref 135–145)

## 2017-04-19 LAB — CBC
HCT: 33.8 % — ABNORMAL LOW (ref 36.0–46.0)
Hemoglobin: 11.6 g/dL — ABNORMAL LOW (ref 12.0–15.0)
MCH: 29 pg (ref 26.0–34.0)
MCHC: 34.3 g/dL (ref 30.0–36.0)
MCV: 84.5 fL (ref 78.0–100.0)
PLATELETS: 254 10*3/uL (ref 150–400)
RBC: 4 MIL/uL (ref 3.87–5.11)
RDW: 23.5 % — AB (ref 11.5–15.5)
WBC: 7 10*3/uL (ref 4.0–10.5)

## 2017-04-19 LAB — HEPATIC FUNCTION PANEL
ALK PHOS: 175 U/L — AB (ref 38–126)
ALT: 274 U/L — AB (ref 14–54)
AST: 274 U/L — AB (ref 15–41)
Albumin: 2.4 g/dL — ABNORMAL LOW (ref 3.5–5.0)
BILIRUBIN DIRECT: 6.2 mg/dL — AB (ref 0.1–0.5)
BILIRUBIN INDIRECT: 5.6 mg/dL — AB (ref 0.3–0.9)
Total Bilirubin: 11.8 mg/dL — ABNORMAL HIGH (ref 0.3–1.2)
Total Protein: 5.3 g/dL — ABNORMAL LOW (ref 6.5–8.1)

## 2017-04-19 MED ORDER — GADOBENATE DIMEGLUMINE 529 MG/ML IV SOLN
15.0000 mL | Freq: Once | INTRAVENOUS | Status: AC | PRN
  Administered 2017-04-19: 15 mL via INTRAVENOUS

## 2017-04-19 MED ORDER — DEXTROSE-NACL 5-0.9 % IV SOLN
INTRAVENOUS | Status: DC
  Administered 2017-04-19: 09:00:00 via INTRAVENOUS

## 2017-04-19 NOTE — Progress Notes (Signed)
Discharge instructions gone over with patient. Home medications discussed. Follow up appointment to be made for ERCP. Patient is to call  GI doctor Monday, 04/21/17.  Diet and activity discussed. Signs and symptoms of worsening condition and what to do discussed. Patient had lab work drawn prior to discharge. She has her portable oxygen unit for travelling home. Patient verbalized understanding of instructions.

## 2017-04-19 NOTE — Progress Notes (Signed)
Initial Nutrition Assessment  DOCUMENTATION CODES:   Not applicable  INTERVENTION:   -Continue Ensure Enlive po BID, each supplement provides 350 kcal and 20 grams of protein  NUTRITION DIAGNOSIS:   Increased nutrient needs related to acute illness (potential malignancy) as evidenced by estimated needs.  GOAL:   Patient will meet greater than or equal to 90% of their needs  MONITOR:   PO intake, Supplement acceptance, Labs, Weight trends, Skin, I & O's  REASON FOR ASSESSMENT:   Malnutrition Screening Tool    ASSESSMENT:   Japji Kok is a 71 y.o. female transfer from [facility] to [facility] for further evaluation and management of jaundice. Patient was told by her friend on Thursday that she has yellowish discoloration of the eye. Patient subsequently was seen by PCP and blood work revealed jaundice with bilirubin of around 14/15. Patient was asked to go to [facility] ER for further management. Blood work at [facility] revealed bilirubin of 15.7 with elevated AST ALT and alkaline phosphatase. Normal lipase. Normal CBC. Patient is afebrile was transferred to [facility] [place] for further management because of lack of GI coverage at [facility].   Pt admitted with jaundice.   Reviewed GI consul note; CT scan showed large stone at the neck of the gallbladder and cannot rule out underlying mass.  Spoke with pt at bedside. She reports decreased oral intake related to "getting old". Pt shares that she continues to eat 3 meals per day, but is unable to eat as much as she used to. Additionally, pt expressed frustration over large portions whenever she goes out to eat.   Pt reports that she has experienced weight loss, however, states "it's just a few pounds". Noted wt stability over the past 6 months.   Pt reports she has not eaten anything since admissions secondary to upcoming procedures. She is very anxious to receive her MRI.   Nutrition-Focused physical exam completed.  Findings are mild fat depletion, mild muscle depletion, and no edema. Pt reports she has always been small framed. Muscle depletion noted in lower and upper extremities only.   Visit prematurely terminated due to MD visit.   Labs reviewed: AST/ALT: 274/274.   Diet Order:  Diet regular Room service appropriate? Yes; Fluid consistency: Thin  Skin:  Reviewed, no issues  Last BM:  04/17/17  Height:   Ht Readings from Last 1 Encounters:  04/18/17 5\' 8"  (1.727 m)    Weight:   Wt Readings from Last 1 Encounters:  04/18/17 141 lb (64 kg)    Ideal Body Weight:  63.6 kg  BMI:  Body mass index is 21.44 kg/m.  Estimated Nutritional Needs:   Kcal:  1600-1800  Protein:  85-100 grams  Fluid:  1.6-1.8 L  EDUCATION NEEDS:   No education needs identified at this time  Vasti Yagi A. Jimmye Norman, RD, LDN, CDE Pager: [phone] After hours Pager: [phone]

## 2017-04-19 NOTE — Discharge Summary (Signed)
Physician Discharge Summary  Margaret Stevenson WLN:[id number] DOB: January 06, 1946 DOA: 04/18/2017  PCP: Dion Body, MD  Admit date: 04/18/2017 Discharge date: 04/19/2017  Time spent: > 35 minutes  Recommendations for Outpatient Follow-up:  1. Follow-up with blood work drawn prior to discharge 2. Ensure patient follows up with gastroenterologist for ERCP   Discharge Diagnoses:  Active Problems:   Jaundice   Discharge Condition: Stable  Diet recommendation: Regular diet  Filed Weights   04/18/17 2050  Weight: 64 kg (141 lb)    History of present illness:  Patient is a 71 year old with history of alcohol and tobacco use as well as COPD presenting with painless jaundice.  Hospital Course:  Painless jaundice - Etiology uncertain and patient will require further workup. Plan is to discharge as patient can get outpatient evaluation. GI has seen patient and recommended some blood work prior to discharge. Plan is for patient to follow-up for ERCP  For other known medical conditions listed above continue medication regimen listed below  Procedures:  None  Consultations:  GI  Discharge Exam: Vitals:   04/19/17 0422 04/19/17 1351  BP: 130/62 (!) 125/55  Pulse: 81 71  Resp: 17 18  Temp: 98.2 F (36.8 C) 97.9 F (36.6 C)    General: *Patient in no acute distress, alert and awake Cardiovascular: Regular rate and rhythm, no murmurs or rubs Respiratory: Prolonged expiratory phase, equal chest rise, no wheezes  Discharge Instructions   Discharge Instructions    Call MD for:  severe uncontrolled pain    Complete by:  As directed    Diet - low sodium heart healthy    Complete by:  As directed    Discharge instructions    Complete by:  As directed    Please follow-up with your gastroenterologist for results regarding lab work drawn on day of discharge. Also plans for ERCP on Monday   Increase activity slowly    Complete by:  As directed      Current Discharge  Medication List    CONTINUE these medications which have NOT CHANGED   Details  albuterol (VENTOLIN HFA) 108 (90 Base) MCG/ACT inhaler Inhale 2 puffs into the lungs every 4 (four) hours as needed.     ALPRAZolam (XANAX) 1 MG tablet Take 1 tablet (1 mg total) by mouth 3 (three) times daily as needed for anxiety. Qty: 15 tablet, Refills: 0    benzonatate (TESSALON) 100 MG capsule Take 1 capsule (100 mg total) by mouth 2 (two) times daily. Qty: 20 capsule, Refills: 0    bisacodyl (DULCOLAX) 5 MG EC tablet Take 1 tablet (5 mg total) by mouth daily as needed for moderate constipation. Qty: 30 tablet, Refills: 0    Fluticasone-Salmeterol (ADVAIR) 250-50 MCG/DOSE AEPB Inhale 1 puff into the lungs 2 (two) times daily.    guaiFENesin (MUCINEX) 600 MG 12 hr tablet Take 1 tablet (600 mg total) by mouth 2 (two) times daily as needed. Qty: 15 tablet, Refills: 0    ipratropium-albuterol (DUONEB) 0.5-2.5 (3) MG/3ML SOLN Take 3 mLs by nebulization every 4 (four) hours. Qty: 360 mL, Refills: 1    mometasone-formoterol (DULERA) 200-5 MCG/ACT AERO Inhale 2 puffs into the lungs 2 (two) times daily. Qty: 1 Inhaler, Refills: 1    montelukast (SINGULAIR) 10 MG tablet Take 1 tablet (10 mg total) by mouth at bedtime. Qty: 30 tablet, Refills: 1    tiotropium (SPIRIVA) 18 MCG inhalation capsule Place 18 mcg into inhaler and inhale at bedtime.  STOP taking these medications     predniSONE (DELTASONE) 10 MG tablet        Allergies  Allergen Reactions  . Penicillins Hives    Has patient had a PCN reaction causing immediate rash, facial/tongue/throat swelling, SOB or lightheadedness with hypotension: no Has patient had a PCN reaction causing severe rash involving mucus membranes or skin necrosis: no Has patient had a PCN reaction that required hospitalization no Has patient had a PCN reaction occurring within the last 10 years: no If all of the above answers are "NO", then may proceed with  Cephalosporin use.       The results of significant diagnostics from this hospitalization (including imaging, microbiology, ancillary and laboratory) are listed below for reference.    Significant Diagnostic Studies: Ct Abdomen Pelvis W Contrast  Result Date: 04/17/2017 CLINICAL DATA:  Acute onset of jaundice and lack of appetite. Vomiting. Initial encounter. EXAM: CT ABDOMEN AND PELVIS WITH CONTRAST TECHNIQUE: Multidetector CT imaging of the abdomen and pelvis was performed using the standard protocol following bolus administration of intravenous contrast. CONTRAST:  188mL ISOVUE-300 IOPAMIDOL (ISOVUE-300) INJECTION 61% COMPARISON:  None. FINDINGS: Lower chest: Bibasilar atelectasis or scarring is noted. The visualized portions of the mediastinum are unremarkable. Hepatobiliary: The liver is grossly unremarkable in appearance. There is diffuse soft tissue inflammation about the porta hepatis, with pericholecystic fluid and mild gallbladder wall edema, concerning for acute cholecystitis. The gallbladder is somewhat distended. There appear to be stones lodged at the neck of the gallbladder. The common bile duct appears normal in caliber. A mildly prominent 1.2 cm node is noted anterior to the infrahepatic IVC. Pancreas: The pancreas is within normal limits. Spleen: The spleen is unremarkable in appearance. Adrenals/Urinary Tract: The adrenal glands are unremarkable in appearance. Mild right renal scarring is noted. The kidneys are otherwise unremarkable. There is no evidence of hydronephrosis. No renal or ureteral stones are identified. No significant perinephric stranding is appreciated. Stomach/Bowel: The stomach is unremarkable in appearance. The small bowel is within normal limits. The appendix is normal in caliber, without evidence of appendicitis. Vague segmental wall thickening is noted at the distal transverse colon. An underlying mass cannot be entirely excluded. Scattered diverticulosis is noted  along the sigmoid colon, without evidence of diverticulitis. Vascular/Lymphatic: Scattered calcification is seen along the abdominal aorta and its branches. The abdominal aorta is otherwise grossly unremarkable. The inferior vena cava is grossly unremarkable. No retroperitoneal lymphadenopathy is seen. No pelvic sidewall lymphadenopathy is identified. Reproductive: The bladder is mildly distended and within normal limits. The uterus is grossly unremarkable in appearance. The ovaries are relatively symmetric. No suspicious adnexal masses are seen. Other: No additional soft tissue abnormalities are seen. Musculoskeletal: No acute osseous abnormalities are identified. Multilevel vacuum phenomenon is noted along the lumbar spine, with underlying endplate sclerotic change. The visualized musculature is unremarkable in appearance. IMPRESSION: 1. Diffuse soft tissue inflammation about the porta hepatis, likely reflecting cholangitis, with mild gallbladder wall edema and pericholecystic fluid, concerning for acute cholecystitis. Gallbladder somewhat distended in appearance. Apparent stones lodged at the neck of the gallbladder. Mildly prominent adjacent node noted. Underlying mass is considered unlikely, but cannot be entirely excluded. Would correlate with the chronicity of the patient's symptoms. 2. Vague segmental wall thickening at the distal transverse colon. Underlying mass cannot be entirely excluded, though this may remain within normal limits. Colonoscopy would be helpful for further evaluation, when and as deemed clinically appropriate. 3. Scattered diverticulosis along the sigmoid colon, without evidence of diverticulitis. 4.  Mild right renal scarring noted. 5. Scattered aortic atherosclerosis. 6. Mild degenerative change along the lumbar spine. 7. Atelectasis or scarring noted at the lung bases. Electronically Signed   By: Garald Balding M.D.   On: 04/17/2017 18:46   Mr 3d Recon At Scanner  Result Date:  04/19/2017 CLINICAL DATA:  Painless jaundice, weight loss, cholelithiasis, abnormal CT EXAM: MRI ABDOMEN WITHOUT AND WITH CONTRAST (INCLUDING MRCP) TECHNIQUE: Multiplanar multisequence MR imaging of the abdomen was performed both before and after the administration of intravenous contrast. Heavily T2-weighted images of the biliary and pancreatic ducts were obtained, and three-dimensional MRCP images were rendered by post processing. CONTRAST:  62mL MULTIHANCE GADOBENATE DIMEGLUMINE 529 MG/ML IV SOLN COMPARISON:  Right upper quadrant ultrasound dated 04/18/2017. CT abdomen/pelvis dated 04/17/2017. FINDINGS: Severely motion degraded images. Lower chest: Lung bases are clear. Hepatobiliary: Liver is poorly evaluated due to motion degradation. Specifically, the dynamic postcontrast imaging is essentially nondiagnostic. However, no focal hepatic lesion is seen. Mild periportal edema predominantly along the left biliary tree (series 3/ image 21). This appearance raises concern for cholangitis. Hepatitis is considered less likely given the asymmetric appearance. Given concern for malignancy, is certainly would be difficult to exclude cholangiocarcinoma given the limitations of the current study. However, intrahepatic ductal dilatation seems less pronounced than periportal edema. Regardless, this remains within the differential. Mildly distended gallbladder with layering 1.7 cm gallstone. No gallbladder wall thickening. Mild pericholecystic fluid. This appearance is not convincing for acute cholecystitis on MRI, although this was more suspicious on prior CT. Common duct measures 5 mm, within normal limits. No choledocholithiasis is seen. Pancreas:  Within normal limits. Spleen:  Within normal limits. Adrenals/Urinary Tract:  Adrenal glands are within normal limits. Kidneys are within normal limits.  No hydronephrosis. Stomach/Bowel: Stomach is grossly unremarkable. Visualized bowel is unremarkable. Vascular/Lymphatic:  No  evidence of abdominal aortic aneurysm. No suspicious abdominal lymphadenopathy. Other:  No abdominal ascites. Musculoskeletal: No focal osseous lesions. IMPRESSION: Severely motion degraded images, limiting evaluation. Specifically, the dynamic post-contrast imaging is essentially nondiagnostic. Cholelithiasis with mild pericholecystic fluid. This appearance is not convincing for acute cholecystitis on MRI, although this was more suspicious on prior CT. Common duct measures 5 mm, within normal limits. No choledocholithiasis is seen. Mild periportal edema, predominantly within the left hepatic lobe. To a lesser extent, mild intrahepatic ductal dilatation may be present, although this is equivocal. This overall appearance favors cholangitis. Given concern for malignancy, cholangiocarcinoma is difficult to exclude on the current limited evaluation. Electronically Signed   By: Julian Hy M.D.   On: 04/19/2017 14:43   Mr Abdomen Mrcp Moise Boring Contast  Result Date: 04/19/2017 CLINICAL DATA:  Painless jaundice, weight loss, cholelithiasis, abnormal CT EXAM: MRI ABDOMEN WITHOUT AND WITH CONTRAST (INCLUDING MRCP) TECHNIQUE: Multiplanar multisequence MR imaging of the abdomen was performed both before and after the administration of intravenous contrast. Heavily T2-weighted images of the biliary and pancreatic ducts were obtained, and three-dimensional MRCP images were rendered by post processing. CONTRAST:  79mL MULTIHANCE GADOBENATE DIMEGLUMINE 529 MG/ML IV SOLN COMPARISON:  Right upper quadrant ultrasound dated 04/18/2017. CT abdomen/pelvis dated 04/17/2017. FINDINGS: Severely motion degraded images. Lower chest: Lung bases are clear. Hepatobiliary: Liver is poorly evaluated due to motion degradation. Specifically, the dynamic postcontrast imaging is essentially nondiagnostic. However, no focal hepatic lesion is seen. Mild periportal edema predominantly along the left biliary tree (series 3/ image 21). This  appearance raises concern for cholangitis. Hepatitis is considered less likely given the asymmetric appearance. Given concern for  malignancy, is certainly would be difficult to exclude cholangiocarcinoma given the limitations of the current study. However, intrahepatic ductal dilatation seems less pronounced than periportal edema. Regardless, this remains within the differential. Mildly distended gallbladder with layering 1.7 cm gallstone. No gallbladder wall thickening. Mild pericholecystic fluid. This appearance is not convincing for acute cholecystitis on MRI, although this was more suspicious on prior CT. Common duct measures 5 mm, within normal limits. No choledocholithiasis is seen. Pancreas:  Within normal limits. Spleen:  Within normal limits. Adrenals/Urinary Tract:  Adrenal glands are within normal limits. Kidneys are within normal limits.  No hydronephrosis. Stomach/Bowel: Stomach is grossly unremarkable. Visualized bowel is unremarkable. Vascular/Lymphatic:  No evidence of abdominal aortic aneurysm. No suspicious abdominal lymphadenopathy. Other:  No abdominal ascites. Musculoskeletal: No focal osseous lesions. IMPRESSION: Severely motion degraded images, limiting evaluation. Specifically, the dynamic post-contrast imaging is essentially nondiagnostic. Cholelithiasis with mild pericholecystic fluid. This appearance is not convincing for acute cholecystitis on MRI, although this was more suspicious on prior CT. Common duct measures 5 mm, within normal limits. No choledocholithiasis is seen. Mild periportal edema, predominantly within the left hepatic lobe. To a lesser extent, mild intrahepatic ductal dilatation may be present, although this is equivocal. This overall appearance favors cholangitis. Given concern for malignancy, cholangiocarcinoma is difficult to exclude on the current limited evaluation. Electronically Signed   By: Julian Hy M.D.   On: 04/19/2017 14:43   US Abdomen Limited  Ruq  Result Date: 04/18/2017 CLINICAL DATA:  Right upper quadrant abdominal pain. EXAM: US ABDOMEN LIMITED - RIGHT UPPER QUADRANT COMPARISON:  CT, 04/17/2017 FINDINGS: Gallbladder: 2 cm stone lies in the gallbladder neck. There is mild wall thickening of the lower aspect of the gallbladder, with the wall measuring 5 mm, associated with a small amount of adjacent pericholecystic fluid. Wall of the middle to upper gallbladder appears normal in thickness. Common bile duct: Diameter: 2.8 mm Liver: Liver demonstrates somewhat echogenic portal triads consistent with the periportal hypoattenuation noted on CT. This can be seen in the setting of hepatic inflammation but is nonspecific. Liver is normal in overall size. No liver mass or focal lesion. IMPRESSION: 1. 2 cm gallstone with wall thickening of the lower aspect of the gallbladder and a small amount of associated pericholecystic fluid. In the proper clinical setting, early acute cholecystitis should be suspected. 2. No bile duct dilation. 3. Liver appearance is consistent with best seen on the previous day's CT scan. Echogenic portal triads suggests hepatic inflammation. Hepatic inflammation may be the source of the pericholecystic fluid and cause reactive wall thickening, as an alternative to acute cholecystitis. Electronically Signed   By: Lajean Manes M.D.   On: 04/18/2017 09:53    Microbiology: Recent Results (from the past 240 hour(s))  Blood culture (routine x 2)     Status: None (Preliminary result)   Collection Time: 04/18/17  8:06 AM  Result Value Ref Range Status   Specimen Description BLOOD RIGHT FOREARM  Final   Special Requests   Final    BOTTLES DRAWN AEROBIC AND ANAEROBIC Blood Culture results may not be optimal due to an excessive volume of blood received in culture bottles   Culture NO GROWTH < 24 HOURS  Final   Report Status PENDING  Incomplete  Blood culture (routine x 2)     Status: None (Preliminary result)   Collection Time:  04/18/17  8:42 AM  Result Value Ref Range Status   Specimen Description BLOOD LEFT ANTECUBITAL  Final   Special  Requests   Final    BOTTLES DRAWN AEROBIC AND ANAEROBIC Blood Culture results may not be optimal due to an excessive volume of blood received in culture bottles   Culture NO GROWTH < 24 HOURS  Final   Report Status PENDING  Incomplete     Labs: Basic Metabolic Panel:  Recent Labs Lab 04/17/17 1758 04/18/17 0805 04/18/17 1815 04/19/17 0535  NA  --  137  --  138  K  --  3.3*  --  3.6  CL  --  98*  --  101  CO2  --  28  --  28  GLUCOSE  --  103*  --  90  BUN  --  6  --  5*  CREATININE 0.50 <0.30* 0.54 0.47  CALCIUM  --  8.7*  --  8.5*  PHOS  --   --  4.3  --    Liver Function Tests:  Recent Labs Lab 04/18/17 0805 04/18/17 1815 04/19/17 0920  AST 407* 320* 274*  ALT 372* 322* 274*  ALKPHOS 215* 196* 175*  BILITOT 15.7* 14.3* 11.8*  PROT 6.4* 5.9* 5.3*  ALBUMIN 3.1* 2.8* 2.4*    Recent Labs Lab 04/18/17 0805  LIPASE 32   No results for input(s): AMMONIA in the last 168 hours. CBC:  Recent Labs Lab 04/18/17 0805 04/18/17 1815 04/19/17 0535  WBC 7.0 8.4 7.0  NEUTROABS 3.8  --   --   HGB 13.0 12.8 11.6*  HCT 37.1 36.7 33.8*  MCV 85.9 84.2 84.5  PLT 266 289 254   Cardiac Enzymes: No results for input(s): CKTOTAL, CKMB, CKMBINDEX, TROPONINI in the last 168 hours. BNP: BNP (last 3 results) No results for input(s): BNP in the last 8760 hours.  ProBNP (last 3 results) No results for input(s): PROBNP in the last 8760 hours.  CBG: No results for input(s): GLUCAP in the last 168 hours.  Signed:  Velvet Bathe MD.  [facility] 04/19/2017, 4:14 PM

## 2017-04-19 NOTE — Progress Notes (Signed)
MRI-MRCP report reviewed. No discrete pancreatic mass. Normal caliber CBD. Mild intrahepatic ductal dilatation with mild periportal edema, finding suspicious for cholangitis, underlying Cholangiocarcinoma cannot be ruled out. Patient does not have any sign or symptoms of ascending cholangitis. Normal WBC. No abdominal pain. She is afebrile. Case discussed with Dr. Henrene Pastor who is covering for ERCP this weekend. Patient does not want to wait for ERCP till Monday and she wants to go home and wants to have outpatient workup done.  Given lack of symptoms and improvement in LFTs, we think its reasonable to do outpatient workup/management and endoscopic intervention. Given painless jaundice and weight loss, I will get CEA, CA-19-9 and AFP. Plan for outpatient ERCP. Again patient LFTs are improving. Bilirubin dropped from 15.7 to 11.8 in just one day.  Okay to discharge from GI standpoint. Contact information provided to  patient to call us back on Monday. Patient was advised to come back to ER if she develops fever, abdominal pain , confusion or worsening jaundice. Patient verbalized understanding   Otis Brace MD, [place] 04/19/2017, 4:51 PM  Pager [phone]  If no answer or after 5 PM call [phone]

## 2017-04-19 NOTE — Consult Note (Signed)
Referring Provider:  Dr. Wendee Beavers  Primary Care Physician:  Dion Body, MD Primary Gastroenterologist:  Unassigned/[place]  Reason for Consultation:  Jaundice  HPI: Margaret Stevenson is a 71 y.o. female transfer from [facility] to [facility] for further evaluation and management of jaundice. Patient was told by her friend on Thursday that she has yellowish discoloration of the eye. Patient subsequently was seen by PCP and blood work revealed jaundice with bilirubin of around 14/15. Patient was asked to go to [facility] ER for further management. Blood work at [facility] revealed bilirubin of 15.7 with elevated AST ALT and alkaline phosphatase. Normal lipase. Normal CBC. Patient is afebrile was transferred to [facility] for further management because of lack of GI coverage at [facility].  Patient seen and examined. According to patient she started noticing dark-colored urine from beginning of this month. Patient is also complaining of weight loss and decreased appetite. Denied any nausea or vomiting. Denied any abdominal pain. Complaining of light yellow colored stool as well. Denied any fever, chills, confusion.  Family history of colon cancer in grandmother. Patient had multiple colonoscopy in the past, last were around 3-4 years ago according to patient.  Past Medical History:  Diagnosis Date  . Cancer ([place])    melanoma 07/2013  . COPD (chronic obstructive pulmonary disease) ([place])   . Jaundice 04/2017    Past Surgical History:  Procedure Laterality Date  . BUNIONECTOMY    . TONSILLECTOMY      Prior to Admission medications   Medication Sig Start Date End Date Taking? Authorizing Provider  albuterol (VENTOLIN HFA) 108 (90 Base) MCG/ACT inhaler Inhale 2 puffs into the lungs every 4 (four) hours as needed.  08/27/16 08/27/17  [provider]  ALPRAZolam Duanne Moron) 1 MG tablet Take 1 tablet (1 mg total) by mouth 3 (three) times daily as needed for anxiety. Patient not  taking: Reported on 04/18/2017 10/18/16   Demetrios Loll, MD  benzonatate (TESSALON) 100 MG capsule Take 1 capsule (100 mg total) by mouth 2 (two) times daily. Patient not taking: Reported on 04/18/2017 10/18/16   Demetrios Loll, MD  bisacodyl (DULCOLAX) 5 MG EC tablet Take 1 tablet (5 mg total) by mouth daily as needed for moderate constipation. Patient not taking: Reported on 04/18/2017 10/18/16   Demetrios Loll, MD  Fluticasone-Salmeterol (ADVAIR) 250-50 MCG/DOSE AEPB Inhale 1 puff into the lungs 2 (two) times daily.    [provider]  guaiFENesin (MUCINEX) 600 MG 12 hr tablet Take 1 tablet (600 mg total) by mouth 2 (two) times daily as needed. Patient not taking: Reported on 04/18/2017 10/10/16   Fritzi Mandes, MD  ipratropium-albuterol (DUONEB) 0.5-2.5 (3) MG/3ML SOLN Take 3 mLs by nebulization every 4 (four) hours. Patient not taking: Reported on 04/18/2017 10/10/16   Fritzi Mandes, MD  mometasone-formoterol [facility]) 200-5 MCG/ACT AERO Inhale 2 puffs into the lungs 2 (two) times daily. Patient not taking: Reported on 04/18/2017 10/10/16   Fritzi Mandes, MD  montelukast (SINGULAIR) 10 MG tablet Take 1 tablet (10 mg total) by mouth at bedtime. Patient not taking: Reported on 04/18/2017 10/10/16   Fritzi Mandes, MD  predniSONE (DELTASONE) 10 MG tablet Take 6 tablets (60 mg total) by mouth daily with breakfast. Taper by 10 mg daily then stop Patient not taking: Reported on 04/18/2017 10/10/16   Fritzi Mandes, MD  tiotropium (SPIRIVA) 18 MCG inhalation capsule Place 18 mcg into inhaler and inhale at bedtime.  08/27/16 08/27/17  [provider]    Scheduled Meds: .  feeding supplement (ENSURE ENLIVE)  237 mL Oral BID BM  . heparin  5,000 Units Subcutaneous Q8H  . mometasone-formoterol  2 puff Inhalation BID  . montelukast  10 mg Oral QHS  . nicotine  14 mg Transdermal Daily  . sodium chloride flush  3 mL Intravenous Q12H  . tiotropium  18 mcg Inhalation QHS   Continuous Infusions: . sodium chloride    .  dextrose 5 % and 0.9% NaCl     PRN Meds:.sodium chloride, albuterol, sodium chloride flush  Allergies as of 04/18/2017 - Review Complete 04/18/2017  Allergen Reaction Noted  . Penicillins Hives 08/02/2016    Family History  Problem Relation Age of Onset  . Breast cancer Mother 45    Social History   Social History  . Marital status: Single    Spouse name: N/A  . Number of children: N/A  . Years of education: N/A   Occupational History  . Not on file.   Social History Main Topics  . Smoking status: Current Every Day Smoker    Packs/day: 1.00    Years: 57.00    Types: Cigarettes  . Smokeless tobacco: Never Used  . Alcohol use No  . Drug use: No  . Sexual activity: Not on file   Other Topics Concern  . Not on file   Social History Narrative  . No narrative on file    Review of Systems:  Review of Systems  Constitutional: Positive for weight loss. Negative for chills and fever.  HENT: Negative for ear discharge, ear pain, hearing loss and tinnitus.   Eyes: Negative for blurred vision, double vision and photophobia.  Respiratory: Negative for cough, hemoptysis and sputum production.   Cardiovascular: Negative for chest pain, palpitations and orthopnea.  Gastrointestinal: Negative for abdominal pain, blood in stool, constipation, diarrhea, heartburn, nausea and vomiting.  Genitourinary: Negative for dysuria and urgency.  Musculoskeletal: Negative for falls, myalgias and neck pain.  Skin: Negative for itching and rash.  Neurological: Negative for dizziness, speech change, focal weakness and seizures.  Endo/Heme/Allergies: Does not bruise/bleed easily.  Psychiatric/Behavioral: Negative for hallucinations and suicidal ideas.    Physical Exam: Vital signs: Vitals:   04/18/17 2050 04/19/17 0422  BP: (!) 121/51 130/62  Pulse: 85 81  Resp: 18 17  Temp: 98.5 F (36.9 C) 98.2 F (36.8 C)   Last BM Date: 04/17/17 Physical Exam  Constitutional: She is oriented to  person, place, and time and well-developed, well-nourished, and in no distress. No distress.  HENT:  Head: Normocephalic and atraumatic.  Nose: Nose normal.  Mouth/Throat: No oropharyngeal exudate.  Eyes: EOM are normal. Left eye exhibits no discharge. Scleral icterus is present.  Neck: Normal range of motion. Neck supple. No thyromegaly present.  Cardiovascular: Normal rate, regular rhythm and normal heart sounds.   Pulmonary/Chest: Breath sounds normal. She is in respiratory distress. She has no wheezes.  Abdominal: Soft. Bowel sounds are normal. She exhibits no distension. There is no tenderness. There is no rebound and no guarding.  Musculoskeletal: Normal range of motion. She exhibits no edema.  Neurological: She is alert and oriented to person, place, and time.  Skin: Skin is dry. No rash noted. No erythema.  Psychiatric: Mood, affect and judgment normal.    GI:  Lab Results:  Recent Labs  04/18/17 0805 04/18/17 1815 04/19/17 0535  WBC 7.0 8.4 7.0  HGB 13.0 12.8 11.6*  HCT 37.1 36.7 33.8*  PLT 266 289 254   BMET  Recent Labs  04/18/17 0805 04/18/17 1815 04/19/17 0535  NA 137  --  138  K 3.3*  --  3.6  CL 98*  --  101  CO2 28  --  28  GLUCOSE 103*  --  90  BUN 6  --  5*  CREATININE <0.30* 0.54 0.47  CALCIUM 8.7*  --  8.5*   LFT  Recent Labs  04/18/17 1815  PROT 5.9*  ALBUMIN 2.8*  AST 320*  ALT 322*  ALKPHOS 196*  BILITOT 14.3*  BILIDIR 7.6*  IBILI 6.7*   PT/INR No results for input(s): LABPROT, INR in the last 72 hours.   Studies/Results: Ct Abdomen Pelvis W Contrast  Result Date: 04/17/2017 CLINICAL DATA:  Acute onset of jaundice and lack of appetite. Vomiting. Initial encounter. EXAM: CT ABDOMEN AND PELVIS WITH CONTRAST TECHNIQUE: Multidetector CT imaging of the abdomen and pelvis was performed using the standard protocol following bolus administration of intravenous contrast. CONTRAST:  175mL ISOVUE-300 IOPAMIDOL (ISOVUE-300) INJECTION 61%  COMPARISON:  None. FINDINGS: Lower chest: Bibasilar atelectasis or scarring is noted. The visualized portions of the mediastinum are unremarkable. Hepatobiliary: The liver is grossly unremarkable in appearance. There is diffuse soft tissue inflammation about the porta hepatis, with pericholecystic fluid and mild gallbladder wall edema, concerning for acute cholecystitis. The gallbladder is somewhat distended. There appear to be stones lodged at the neck of the gallbladder. The common bile duct appears normal in caliber. A mildly prominent 1.2 cm node is noted anterior to the infrahepatic IVC. Pancreas: The pancreas is within normal limits. Spleen: The spleen is unremarkable in appearance. Adrenals/Urinary Tract: The adrenal glands are unremarkable in appearance. Mild right renal scarring is noted. The kidneys are otherwise unremarkable. There is no evidence of hydronephrosis. No renal or ureteral stones are identified. No significant perinephric stranding is appreciated. Stomach/Bowel: The stomach is unremarkable in appearance. The small bowel is within normal limits. The appendix is normal in caliber, without evidence of appendicitis. Vague segmental wall thickening is noted at the distal transverse colon. An underlying mass cannot be entirely excluded. Scattered diverticulosis is noted along the sigmoid colon, without evidence of diverticulitis. Vascular/Lymphatic: Scattered calcification is seen along the abdominal aorta and its branches. The abdominal aorta is otherwise grossly unremarkable. The inferior vena cava is grossly unremarkable. No retroperitoneal lymphadenopathy is seen. No pelvic sidewall lymphadenopathy is identified. Reproductive: The bladder is mildly distended and within normal limits. The uterus is grossly unremarkable in appearance. The ovaries are relatively symmetric. No suspicious adnexal masses are seen. Other: No additional soft tissue abnormalities are seen. Musculoskeletal: No acute  osseous abnormalities are identified. Multilevel vacuum phenomenon is noted along the lumbar spine, with underlying endplate sclerotic change. The visualized musculature is unremarkable in appearance. IMPRESSION: 1. Diffuse soft tissue inflammation about the porta hepatis, likely reflecting cholangitis, with mild gallbladder wall edema and pericholecystic fluid, concerning for acute cholecystitis. Gallbladder somewhat distended in appearance. Apparent stones lodged at the neck of the gallbladder. Mildly prominent adjacent node noted. Underlying mass is considered unlikely, but cannot be entirely excluded. Would correlate with the chronicity of the patient's symptoms. 2. Vague segmental wall thickening at the distal transverse colon. Underlying mass cannot be entirely excluded, though this may remain within normal limits. Colonoscopy would be helpful for further evaluation, when and as deemed clinically appropriate. 3. Scattered diverticulosis along the sigmoid colon, without evidence of diverticulitis. 4. Mild right renal scarring noted. 5. Scattered aortic atherosclerosis. 6. Mild degenerative change along the lumbar spine. 7. Atelectasis or scarring noted  at the lung bases. Electronically Signed   By: Garald Balding M.D.   On: 04/17/2017 18:46   US Abdomen Limited Ruq  Result Date: 04/18/2017 CLINICAL DATA:  Right upper quadrant abdominal pain. EXAM: US ABDOMEN LIMITED - RIGHT UPPER QUADRANT COMPARISON:  CT, 04/17/2017 FINDINGS: Gallbladder: 2 cm stone lies in the gallbladder neck. There is mild wall thickening of the lower aspect of the gallbladder, with the wall measuring 5 mm, associated with a small amount of adjacent pericholecystic fluid. Wall of the middle to upper gallbladder appears normal in thickness. Common bile duct: Diameter: 2.8 mm Liver: Liver demonstrates somewhat echogenic portal triads consistent with the periportal hypoattenuation noted on CT. This can be seen in the setting of hepatic  inflammation but is nonspecific. Liver is normal in overall size. No liver mass or focal lesion. IMPRESSION: 1. 2 cm gallstone with wall thickening of the lower aspect of the gallbladder and a small amount of associated pericholecystic fluid. In the proper clinical setting, early acute cholecystitis should be suspected. 2. No bile duct dilation. 3. Liver appearance is consistent with best seen on the previous day's CT scan. Echogenic portal triads suggests hepatic inflammation. Hepatic inflammation may be the source of the pericholecystic fluid and cause reactive wall thickening, as an alternative to acute cholecystitis. Electronically Signed   By: Lajean Manes M.D.   On: 04/18/2017 09:53    Impression/Plan: - Painless jaundice with weight loss. CT scan showed large stone at the neck of the gallbladder. Cannot rule out underlying mass. Follow-up ultrasound showed 2 cm gallstone in the gallbladder neck. CBD of 2.8 mm. -  segmental wall thickening at the distal transverse colon. Underlying mass cannot be excluded according to CT scan report - Personal history of multiple colonic polyps. Last colonoscopy 3-4 years ago  Recommendations -------------------------- - No clear-cut etiology for patient's painless jaundice at this point. Given painless jaundice and weight loss along with decreased appetite is concerning for underlying malignancy Patient is afebrile. She has normal white count. No evidence of ascending cholangitis at this point. I think further workup with MRI-MRCP is reasonable given normal caliber CBD of 2.8 mm. MRI will also allow further visualization of inflammation at the level of porta hepatis. This was discussed with the patient. Apparently patient is upset because she was told that she will get ERCP today for stone extraction. Discussed with the patient that there is no evidence of CBD stone at this point as well as her common bile duct is within normal limit. Discussed with the nursing  staff to discuss with radiology department to get MRI-MRCP today. Patient was seen by surgical team yesterday at [facility]. They have recommended cholecystectomy after ERCP if needed.  - She may need colonoscopy given abnormal CT scan showing thickening in the distal transverse colon  - GI will follow    LOS: 1 day   Otis Brace  MD, FACP 04/19/2017, 9:09 AM  Pager [phone] If no answer or after 5 PM call [phone]

## 2017-04-19 NOTE — Progress Notes (Signed)
Spoke with Dr. Alessandra Bevels and Dr. Wendee Beavers concerning patient's wishes about wanting to go home if no other procedures or tests are going to be done before next week. We are awaiting results of the MRI and a decision will be made. Patient was made aware.

## 2017-04-20 LAB — AFP TUMOR MARKER: AFP-Tumor Marker: 26.6 ng/mL — ABNORMAL HIGH (ref 0.0–8.3)

## 2017-04-20 LAB — CEA: CEA: 4.3 ng/mL (ref 0.0–4.7)

## 2017-04-20 LAB — CANCER ANTIGEN 19-9: CA 19-9: 243 U/mL — ABNORMAL HIGH (ref 0–35)

## 2017-04-22 LAB — HEPATITIS PANEL, ACUTE
HEP B S AG: NEGATIVE
Hep A IgM: NEGATIVE
Hep B C IgM: NEGATIVE

## 2017-04-23 LAB — CULTURE, BLOOD (ROUTINE X 2)
CULTURE: NO GROWTH
Culture: NO GROWTH

## 2017-04-25 ENCOUNTER — Ambulatory Visit (HOSPITAL_BASED_OUTPATIENT_CLINIC_OR_DEPARTMENT_OTHER)
Admission: RE | Admit: 2017-04-25 | Discharge: 2017-04-25 | Disposition: A | Discharge status: Home / Self Care | Payer: Medicare Other | Source: Ambulatory Visit | Attending: Gastroenterology | Admitting: Gastroenterology

## 2017-04-25 ENCOUNTER — Encounter
Admission: RE | Disposition: A | Discharge status: Home / Self Care | Payer: Self-pay | Source: Ambulatory Visit | Attending: Gastroenterology

## 2017-04-25 ENCOUNTER — Ambulatory Visit: Payer: Medicare Other | Admitting: Registered Nurse

## 2017-04-25 ENCOUNTER — Ambulatory Visit: Payer: Medicare Other

## 2017-04-25 ENCOUNTER — Encounter: Payer: Self-pay | Admitting: Anesthesiology

## 2017-04-25 DIAGNOSIS — Z9981 Dependence on supplemental oxygen: Secondary | ICD-10-CM | POA: Diagnosis not present

## 2017-04-25 DIAGNOSIS — K831 Obstruction of bile duct: Secondary | ICD-10-CM

## 2017-04-25 DIAGNOSIS — A419 Sepsis, unspecified organism: Secondary | ICD-10-CM | POA: Diagnosis not present

## 2017-04-25 DIAGNOSIS — J9610 Chronic respiratory failure, unspecified whether with hypoxia or hypercapnia: Secondary | ICD-10-CM | POA: Diagnosis present

## 2017-04-25 DIAGNOSIS — K839 Disease of biliary tract, unspecified: Secondary | ICD-10-CM

## 2017-04-25 DIAGNOSIS — F17210 Nicotine dependence, cigarettes, uncomplicated: Secondary | ICD-10-CM | POA: Diagnosis present

## 2017-04-25 DIAGNOSIS — Z880 Allergy status to penicillin: Secondary | ICD-10-CM

## 2017-04-25 DIAGNOSIS — R17 Unspecified jaundice: Secondary | ICD-10-CM | POA: Diagnosis not present

## 2017-04-25 DIAGNOSIS — Z7951 Long term (current) use of inhaled steroids: Secondary | ICD-10-CM

## 2017-04-25 DIAGNOSIS — K830 Cholangitis: Secondary | ICD-10-CM | POA: Diagnosis present

## 2017-04-25 DIAGNOSIS — J449 Chronic obstructive pulmonary disease, unspecified: Secondary | ICD-10-CM

## 2017-04-25 DIAGNOSIS — F419 Anxiety disorder, unspecified: Secondary | ICD-10-CM

## 2017-04-25 DIAGNOSIS — Z85820 Personal history of malignant melanoma of skin: Secondary | ICD-10-CM | POA: Insufficient documentation

## 2017-04-25 DIAGNOSIS — Z803 Family history of malignant neoplasm of breast: Secondary | ICD-10-CM | POA: Diagnosis not present

## 2017-04-25 DIAGNOSIS — E860 Dehydration: Secondary | ICD-10-CM | POA: Diagnosis present

## 2017-04-25 HISTORY — PX: ERCP: SHX5425

## 2017-04-25 SURGERY — ERCP, WITH INTERVENTION IF INDICATED
Anesthesia: General

## 2017-04-25 MED ORDER — LIDOCAINE HCL (CARDIAC) 20 MG/ML IV SOLN
INTRAVENOUS | Status: DC | PRN
  Administered 2017-04-25: 40 mg via INTRAVENOUS

## 2017-04-25 MED ORDER — FENTANYL CITRATE (PF) 100 MCG/2ML IJ SOLN: 25.0000 ug | INTRAMUSCULAR | Status: DC | PRN

## 2017-04-25 MED ORDER — ONDANSETRON HCL 4 MG/2ML IJ SOLN: 4.0000 mg | Freq: Once | INTRAMUSCULAR | Status: DC | PRN

## 2017-04-25 MED ORDER — SODIUM CHLORIDE 0.9 % IV SOLN
INTRAVENOUS | Status: DC
  Administered 2017-04-25: 13:00:00 via INTRAVENOUS

## 2017-04-25 MED ORDER — INDOMETHACIN 50 MG RE SUPP
50.0000 mg | Freq: Once | RECTAL | Status: AC
  Administered 2017-04-25: 50 mg via RECTAL

## 2017-04-25 MED ORDER — PROPOFOL 500 MG/50ML IV EMUL
INTRAVENOUS | Status: AC
Start: 2017-04-25 — End: 2017-04-25
  Filled 2017-04-25: qty 50

## 2017-04-25 MED ORDER — GLYCOPYRROLATE 0.2 MG/ML IJ SOLN
INTRAMUSCULAR | Status: AC
  Filled 2017-04-25: qty 1

## 2017-04-25 MED ORDER — MIDAZOLAM HCL 2 MG/2ML IJ SOLN
INTRAMUSCULAR | Status: AC
  Filled 2017-04-25: qty 2

## 2017-04-25 MED ORDER — PROPOFOL 500 MG/50ML IV EMUL
INTRAVENOUS | Status: DC | PRN
  Administered 2017-04-25: 150 ug/kg/min via INTRAVENOUS

## 2017-04-25 MED ORDER — PROPOFOL 10 MG/ML IV BOLUS
INTRAVENOUS | Status: DC | PRN
  Administered 2017-04-25: 80 mg via INTRAVENOUS
  Administered 2017-04-25: 10 mg via INTRAVENOUS

## 2017-04-25 NOTE — Anesthesia Postprocedure Evaluation (Signed)
Anesthesia Post Note  Patient: Margaret Stevenson  Procedure(s) Performed: Procedure(s) (LRB): ENDOSCOPIC RETROGRADE CHOLANGIOPANCREATOGRAPHY (ERCP) (N/A)  Patient location during evaluation: PACU Anesthesia Type: General Level of consciousness: awake and alert and oriented Pain management: pain level controlled Vital Signs Assessment: post-procedure vital signs reviewed and stable Respiratory status: spontaneous breathing Cardiovascular status: blood pressure returned to baseline Anesthetic complications: no     Last Vitals:  Vitals:   04/25/17 1422 04/25/17 1423  BP: 138/64 138/64  Pulse: 94 93  Resp: 17 20  Temp: (!) 35.6 C (!) 35.6 C    Last Pain:  Vitals:   04/25/17 1423  TempSrc: Tympanic                 Dot Splinter

## 2017-04-25 NOTE — Anesthesia Post-op Follow-up Note (Cosign Needed)
Anesthesia QCDR form completed.        

## 2017-04-25 NOTE — Op Note (Addendum)
[facility] Patient Name: Margaret Stevenson Procedure Date: 04/25/2017 1:18 PM MRN: [id number] Account #: [id number] Date of Birth: 03/08/1946 Admit Type: Outpatient Age: 71 Room: [facility] ENDO ROOM 4 Gender: Female Note Status: Finalized Procedure:            ERCP Indications:          Jaundice with increase AFP and ca 19-9 Providers:            Lucilla Lame MD, MD Referring MD:         Dion Body (Referring MD) Medicines:            Propofol per Anesthesia Complications:        No immediate complications. Procedure:            Pre-Anesthesia Assessment:                       - Prior to the procedure, a History and Physical was                        performed, and patient medications and allergies were                        reviewed. The patient's tolerance of previous                        anesthesia was also reviewed. The risks and benefits of                        the procedure and the sedation options and risks were                        discussed with the patient. All questions were                        answered, and informed consent was obtained. Prior                        Anticoagulants: The patient has taken no previous                        anticoagulant or antiplatelet agents. ASA Grade                        Assessment: II - A patient with mild systemic disease.                        After reviewing the risks and benefits, the patient was                        deemed in satisfactory condition to undergo the                        procedure.                       After obtaining informed consent, the scope was passed                        under direct vision. Throughout the procedure, the  patient's blood pressure, pulse, and oxygen saturations                        were monitored continuously. The Endoscope was                        introduced through the mouth, and used to inject         contrast into and used to inject contrast into the bile                        duct. The ERCP was accomplished without difficulty. The                        patient tolerated the procedure well. Findings:      The scout film was normal. The major papilla was bulging. The bile duct       was deeply cannulated with the short-nosed traction sphincterotome.       Contrast was injected. I personally interpreted the bile duct images.       There was brisk flow of contrast through the ducts. Image quality was       excellent. Contrast extended to the entire biliary tree. The ampulla had       a stenosis contained a single localized stenosis. A wire was passed into       the biliary tree. Biliary sphincterotomy was made with a traction       (standard) sphincterotome using ERBE electrocautery. There was no       post-sphincterotomy bleeding. Cells for cytology were obtained by       brushing. One 10 Fr by 7 cm plastic stent with a single external flap       and a single internal flap was placed 5 cm into the common bile duct.       Bile flowed through the stent. The stent was in good position. This was       biopsied with a cold forceps for histology. Impression:           - The major papilla appeared to be bulging.                       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                       - A biliary sphincterotomy was performed.                       - One plastic stent was placed into the common bile                        duct. Recommendation:       - Watch for pancreatitis, bleeding, perforation, and                        cholangitis. Procedure Code(s):    --- Professional ---                       [phone], Endoscopic retrograde cholangiopancreatography                        (ERCP); with placement of endoscopic stent into biliary  or pancreatic duct, including pre- and post-dilation                        and guide wire passage, when  performed, including                        sphincterotomy, when performed, each stent                       [id number], Endoscopic retrograde cholangiopancreatography                        (ERCP); with biopsy, single or multiple                       [id number], Endoscopic catheterization of the biliary ductal                        system, radiological supervision and interpretation Diagnosis Code(s):    --- Professional ---                       R17, Unspecified jaundice                       K83.9, Disease of biliary tract, unspecified                       K83.1, Obstruction of bile duct CPT copyright 2016 American Medical Association. All rights reserved. The codes documented in this report are preliminary and upon coder review may  be revised to meet current compliance requirements. Lucilla Lame MD, MD 04/25/2017 2:24:03 PM This report has been signed electronically. Number of Addenda: 0 Note Initiated On: 04/25/2017 1:18 PM      [facility]

## 2017-04-25 NOTE — Anesthesia Preprocedure Evaluation (Addendum)
Anesthesia Evaluation  Patient identified by MRN, date of birth, ID band  Reviewed: Allergy & Precautions, NPO status , Patient's Chart, lab work & pertinent test results  Airway Mallampati: II  TM Distance: >3 FB     Dental  (+) Caps   Pulmonary pneumonia, resolved, COPD,  COPD inhaler, Current Smoker,    Pulmonary exam normal        Cardiovascular Normal cardiovascular exam     Neuro/Psych PSYCHIATRIC DISORDERS Anxiety negative neurological ROS     GI/Hepatic   Endo/Other    Renal/GU      Musculoskeletal   Abdominal Normal abdominal exam  (+)   Peds  Hematology   Anesthesia Other Findings   Reproductive/Obstetrics                            Anesthesia Physical Anesthesia Plan  ASA: IV  Anesthesia Plan: General   Post-op Pain Management:    Induction: Intravenous  Airway Management Planned: Nasal Cannula  Additional Equipment:   Intra-op Plan:   Post-operative Plan:   Informed Consent: I have reviewed the patients History and Physical, chart, labs and discussed the procedure including the risks, benefits and alternatives for the proposed anesthesia with the patient or authorized representative who has indicated his/her understanding and acceptance.   Dental advisory given  Plan Discussed with: CRNA and Surgeon  Anesthesia Plan Comments:        Anesthesia Quick Evaluation

## 2017-04-25 NOTE — H&P (Signed)
Margaret Lame, MD [facility] [address], [place], [place] [address] Phone:[phone] Fax : [phone]  Primary Care Physician:  Dion Body, MD Primary Gastroenterologist:  Dr. Allen Norris  Pre-Procedure History & Physical: HPI:  Margaret Stevenson is a 71 y.o. female is here for an ERCP.   Past Medical History:  Diagnosis Date  . Cancer ([place])    melanoma 07/2013  . COPD (chronic obstructive pulmonary disease) ([place])   . Jaundice 04/2017    Past Surgical History:  Procedure Laterality Date  . BUNIONECTOMY    . TONSILLECTOMY      Prior to Admission medications   Medication Sig Start Date End Date Taking? Authorizing Provider  albuterol (VENTOLIN HFA) 108 (90 Base) MCG/ACT inhaler Inhale 2 puffs into the lungs every 4 (four) hours as needed.  08/27/16 08/27/17 Yes [provider]  Fluticasone-Salmeterol (ADVAIR) 250-50 MCG/DOSE AEPB Inhale 1 puff into the lungs 2 (two) times daily.   Yes [provider]  tiotropium (SPIRIVA) 18 MCG inhalation capsule Place 18 mcg into inhaler and inhale at bedtime.  08/27/16 08/27/17 Yes [provider]  ALPRAZolam Duanne Moron) 1 MG tablet Take 1 tablet (1 mg total) by mouth 3 (three) times daily as needed for anxiety. Patient not taking: Reported on 04/18/2017 10/18/16   Demetrios Loll, MD  benzonatate (TESSALON) 100 MG capsule Take 1 capsule (100 mg total) by mouth 2 (two) times daily. Patient not taking: Reported on 04/18/2017 10/18/16   Demetrios Loll, MD  bisacodyl (DULCOLAX) 5 MG EC tablet Take 1 tablet (5 mg total) by mouth daily as needed for moderate constipation. Patient not taking: Reported on 04/18/2017 10/18/16   Demetrios Loll, MD  guaiFENesin (MUCINEX) 600 MG 12 hr tablet Take 1 tablet (600 mg total) by mouth 2 (two) times daily as needed. Patient not taking: Reported on 04/18/2017 10/10/16   Fritzi Mandes, MD  ipratropium-albuterol (DUONEB) 0.5-2.5 (3) MG/3ML SOLN Take 3 mLs by nebulization every 4 (four) hours. Patient not  taking: Reported on 04/18/2017 10/10/16   Fritzi Mandes, MD  mometasone-formoterol [facility]) 200-5 MCG/ACT AERO Inhale 2 puffs into the lungs 2 (two) times daily. Patient not taking: Reported on 04/18/2017 10/10/16   Fritzi Mandes, MD  montelukast (SINGULAIR) 10 MG tablet Take 1 tablet (10 mg total) by mouth at bedtime. Patient not taking: Reported on 04/18/2017 10/10/16   Fritzi Mandes, MD    Allergies as of 04/24/2017 - Review Complete 04/24/2017  Allergen Reaction Noted  . Penicillins Hives 08/02/2016    Family History  Problem Relation Age of Onset  . Breast cancer Mother 28    Social History   Social History  . Marital status: Single    Spouse name: N/A  . Number of children: N/A  . Years of education: N/A   Occupational History  . Not on file.   Social History Main Topics  . Smoking status: Current Every Day Smoker    Packs/day: 1.00    Years: 57.00    Types: Cigarettes  . Smokeless tobacco: Never Used  . Alcohol use No  . Drug use: No  . Sexual activity: Not on file   Other Topics Concern  . Not on file   Social History Narrative  . No narrative on file    Review of Systems: See HPI, otherwise negative ROS  Physical Exam: BP (!) 146/73   Pulse (!) 106   Temp 97.5 F (36.4 C) (Tympanic)   Resp 20   SpO2 98%  General:  Alert,  pleasant and cooperative in NAD Head:  Normocephalic and atraumatic. Neck:  Supple; no masses or thyromegaly. Lungs:  Clear throughout to auscultation.    Heart:  Regular rate and rhythm. Abdomen:  Soft, nontender and nondistended. Normal bowel sounds, without guarding, and without rebound.   Neurologic:  Alert and  oriented x4;  grossly normal neurologically.  Impression/Plan: Margaret Stevenson is here for an ERCP to be performed for jaundice  Risks, benefits, limitations, and alternatives regarding  ERCP have been reviewed with the patient.  Questions have been answered.  All parties agreeable.   Margaret Lame, MD  04/25/2017, 2:14  PM

## 2017-04-25 NOTE — Anesthesia Procedure Notes (Signed)
Date/Time: 04/25/2017 1:32 PM Performed by: Doreen Salvage Pre-anesthesia Checklist: Patient identified, Emergency Drugs available, Suction available and Patient being monitored Patient Re-evaluated:Patient Re-evaluated prior to inductionOxygen Delivery Method: Nasal cannula Intubation Type: IV induction Dental Injury: Teeth and Oropharynx as per pre-operative assessment  Comments: Nasal cannula with etCO2 monitoring

## 2017-04-25 NOTE — Transfer of Care (Signed)
Immediate Anesthesia Transfer of Care Note  Patient: Margaret Stevenson  Procedure(s) Performed: Procedure(s): ENDOSCOPIC RETROGRADE CHOLANGIOPANCREATOGRAPHY (ERCP) (N/A)  Patient Location: PACU and Endoscopy Unit  Anesthesia Type:General  Level of Consciousness: sedated  Airway & Oxygen Therapy: Patient Spontanous Breathing and Patient connected to nasal cannula oxygen  Post-op Assessment: Report given to RN and Post -op Vital signs reviewed and stable  Post vital signs: Reviewed and stable  Last Vitals:  Vitals:   04/25/17 1257 04/25/17 1422  BP: (!) 146/73 138/64  Pulse: (!) 106 94  Resp: 20 17  Temp: 36.4 C (!) 35.3 C    Complications: No apparent anesthesia complications

## 2017-04-27 ENCOUNTER — Inpatient Hospital Stay
Admission: EM | Admit: 2017-04-27 | Discharge: 2017-04-28 | DRG: 871 | Disposition: A | Discharge status: Home / Self Care | Payer: Medicare Other | Attending: Internal Medicine | Admitting: Internal Medicine

## 2017-04-27 ENCOUNTER — Encounter: Payer: Self-pay | Admitting: Emergency Medicine

## 2017-04-27 ENCOUNTER — Emergency Department: Payer: Medicare Other

## 2017-04-27 DIAGNOSIS — J449 Chronic obstructive pulmonary disease, unspecified: Secondary | ICD-10-CM | POA: Diagnosis not present

## 2017-04-27 DIAGNOSIS — A419 Sepsis, unspecified organism: Secondary | ICD-10-CM | POA: Diagnosis not present

## 2017-04-27 DIAGNOSIS — J9610 Chronic respiratory failure, unspecified whether with hypoxia or hypercapnia: Secondary | ICD-10-CM | POA: Diagnosis not present

## 2017-04-27 DIAGNOSIS — Z85820 Personal history of malignant melanoma of skin: Secondary | ICD-10-CM | POA: Diagnosis not present

## 2017-04-27 DIAGNOSIS — E860 Dehydration: Secondary | ICD-10-CM

## 2017-04-27 DIAGNOSIS — Z880 Allergy status to penicillin: Secondary | ICD-10-CM | POA: Diagnosis not present

## 2017-04-27 DIAGNOSIS — Z803 Family history of malignant neoplasm of breast: Secondary | ICD-10-CM | POA: Diagnosis not present

## 2017-04-27 DIAGNOSIS — Z9981 Dependence on supplemental oxygen: Secondary | ICD-10-CM | POA: Diagnosis not present

## 2017-04-27 DIAGNOSIS — K831 Obstruction of bile duct: Secondary | ICD-10-CM | POA: Diagnosis not present

## 2017-04-27 DIAGNOSIS — K830 Cholangitis: Secondary | ICD-10-CM | POA: Diagnosis not present

## 2017-04-27 DIAGNOSIS — F17210 Nicotine dependence, cigarettes, uncomplicated: Secondary | ICD-10-CM | POA: Diagnosis not present

## 2017-04-27 LAB — URINALYSIS, COMPLETE (UACMP) WITH MICROSCOPIC
Bilirubin Urine: NEGATIVE
Glucose, UA: NEGATIVE mg/dL
Ketones, ur: NEGATIVE mg/dL
Nitrite: NEGATIVE
Protein, ur: NEGATIVE mg/dL
Specific Gravity, Urine: 1.025 (ref 1.005–1.030)
pH: 5 (ref 5.0–8.0)

## 2017-04-27 LAB — CBC
HCT: 33.4 % — ABNORMAL LOW (ref 35.0–47.0)
Hemoglobin: 11.3 g/dL — ABNORMAL LOW (ref 12.0–16.0)
MCH: 31.5 pg (ref 26.0–34.0)
MCHC: 33.8 g/dL (ref 32.0–36.0)
MCV: 93.3 fL (ref 80.0–100.0)
Platelets: 418 10*3/uL (ref 150–440)
RBC: 3.58 MIL/uL — ABNORMAL LOW (ref 3.80–5.20)
RDW: 25.2 % — ABNORMAL HIGH (ref 11.5–14.5)
WBC: 15.4 10*3/uL — ABNORMAL HIGH (ref 3.6–11.0)

## 2017-04-27 LAB — COMPREHENSIVE METABOLIC PANEL
ALBUMIN: 3.2 g/dL — AB (ref 3.5–5.0)
ALT: 249 U/L — AB (ref 14–54)
AST: 219 U/L — ABNORMAL HIGH (ref 15–41)
Alkaline Phosphatase: 136 U/L — ABNORMAL HIGH (ref 38–126)
Anion gap: 10 (ref 5–15)
BUN: 40 mg/dL — ABNORMAL HIGH (ref 6–20)
CO2: 28 mmol/L (ref 22–32)
CREATININE: 0.78 mg/dL (ref 0.44–1.00)
Calcium: 9 mg/dL (ref 8.9–10.3)
Chloride: 98 mmol/L — ABNORMAL LOW (ref 101–111)
GFR calc Af Amer: >60 mL/min (ref >60)
GFR calc non Af Amer: >60 mL/min (ref >60)
GLUCOSE: 182 mg/dL — AB (ref 65–99)
Potassium: 4 mmol/L (ref 3.5–5.1)
SODIUM: 136 mmol/L (ref 135–145)
Total Bilirubin: 5.1 mg/dL — ABNORMAL HIGH (ref 0.3–1.2)
Total Protein: 6.4 g/dL — ABNORMAL LOW (ref 6.5–8.1)

## 2017-04-27 LAB — LIPASE, BLOOD: LIPASE: 25 U/L (ref 11–51)

## 2017-04-27 LAB — TROPONIN I: Troponin I: <0.03 ng/mL (ref <0.03)

## 2017-04-27 LAB — TSH: TSH: 2.27 u[IU]/mL (ref 0.350–4.500)

## 2017-04-27 LAB — LACTIC ACID, PLASMA: Lactic Acid, Venous: 1.7 mmol/L (ref 0.5–1.9)

## 2017-04-27 LAB — AMMONIA: Ammonia: 12 umol/L (ref 9–35)

## 2017-04-27 MED ORDER — VANCOMYCIN HCL IN DEXTROSE 1-5 GM/200ML-% IV SOLN
1000.0000 mg | Freq: Once | INTRAVENOUS | Status: AC
  Administered 2017-04-27: 1000 mg via INTRAVENOUS
  Filled 2017-04-27: qty 200

## 2017-04-27 MED ORDER — ONDANSETRON HCL 4 MG PO TABS: 4.0000 mg | ORAL_TABLET | Freq: Four times a day (QID) | ORAL | Status: DC | PRN

## 2017-04-27 MED ORDER — MIRTAZAPINE 15 MG PO TBDP
15.0000 mg | ORAL_TABLET | Freq: Every day | ORAL | Status: DC
  Administered 2017-04-27: 15 mg via ORAL
  Filled 2017-04-27 (×3): qty 1

## 2017-04-27 MED ORDER — TIOTROPIUM BROMIDE MONOHYDRATE 18 MCG IN CAPS
18.0000 ug | ORAL_CAPSULE | Freq: Every day | RESPIRATORY_TRACT | Status: DC
  Administered 2017-04-27: 18 ug via RESPIRATORY_TRACT
  Filled 2017-04-27: qty 5

## 2017-04-27 MED ORDER — ALBUTEROL SULFATE (2.5 MG/3ML) 0.083% IN NEBU: 2.5000 mg | INHALATION_SOLUTION | RESPIRATORY_TRACT | Status: DC | PRN

## 2017-04-27 MED ORDER — FLUTICASONE-SALMETEROL 250-50 MCG/DOSE IN AEPB
1.0000 | INHALATION_SPRAY | Freq: Two times a day (BID) | RESPIRATORY_TRACT | Status: DC
  Administered 2017-04-27 – 2017-04-28 (×3): 1 via RESPIRATORY_TRACT
  Filled 2017-04-27: qty 14

## 2017-04-27 MED ORDER — ONDANSETRON HCL 4 MG/2ML IJ SOLN: 4.0000 mg | Freq: Four times a day (QID) | INTRAMUSCULAR | Status: DC | PRN

## 2017-04-27 MED ORDER — SODIUM CHLORIDE 0.9 % IV SOLN
INTRAVENOUS | Status: DC
  Administered 2017-04-27 (×2): via INTRAVENOUS

## 2017-04-27 MED ORDER — CEFEPIME HCL 2 G IJ SOLR
2.0000 g | Freq: Two times a day (BID) | INTRAMUSCULAR | Status: DC
Start: 2017-04-27 — End: 2017-04-28
  Administered 2017-04-27 – 2017-04-28 (×3): 2 g via INTRAVENOUS
  Filled 2017-04-27 (×4): qty 2

## 2017-04-27 MED ORDER — ACETAMINOPHEN 325 MG PO TABS: 650.0000 mg | ORAL_TABLET | Freq: Four times a day (QID) | ORAL | Status: DC | PRN

## 2017-04-27 MED ORDER — DOCUSATE SODIUM 100 MG PO CAPS
100.0000 mg | ORAL_CAPSULE | Freq: Two times a day (BID) | ORAL | Status: DC
  Administered 2017-04-27: 100 mg via ORAL
  Filled 2017-04-27 (×3): qty 1

## 2017-04-27 MED ORDER — SODIUM CHLORIDE 0.9 % IV BOLUS (SEPSIS)
1000.0000 mL | Freq: Once | INTRAVENOUS | Status: AC
  Administered 2017-04-27: 1000 mL via INTRAVENOUS

## 2017-04-27 MED ORDER — MOMETASONE FURO-FORMOTEROL FUM 200-5 MCG/ACT IN AERO
2.0000 | INHALATION_SPRAY | Freq: Two times a day (BID) | RESPIRATORY_TRACT | Status: DC
  Filled 2017-04-27: qty 8.8

## 2017-04-27 MED ORDER — ACETAMINOPHEN 650 MG RE SUPP: 650.0000 mg | Freq: Four times a day (QID) | RECTAL | Status: DC | PRN

## 2017-04-27 MED ORDER — DEXTROSE 5 % IV SOLN
2.0000 g | Freq: Three times a day (TID) | INTRAVENOUS | Status: DC
  Filled 2017-04-27 (×2): qty 2

## 2017-04-27 MED ORDER — ENOXAPARIN SODIUM 40 MG/0.4ML ~~LOC~~ SOLN
40.0000 mg | SUBCUTANEOUS | Status: DC
  Administered 2017-04-27: 40 mg via SUBCUTANEOUS
  Filled 2017-04-27: qty 0.4

## 2017-04-27 NOTE — Evaluation (Signed)
Physical Therapy Evaluation Patient Details Name: Margaret Stevenson MRN: [id number] DOB: September 22, 1946 Today's Date: 04/27/2017   History of Present Illness  71 y/o female here with recent nausea/vomity and weakness as she has been unable to eat.  Until ~6 months ago was indepdendent.  past medical history of recent biliary stent placement for jaundice.  Clinical Impression  Pt was able to do some limited ambulation with walker and 2 liters O2 but fatigued very quickly, reports feeling very weak and does agree that she would not be able to return home safely at this time.  She feels that when she is able to eat she will get stronger and be able to go home but at this time is not able to do so.  Pt on 2 liters t/o the effort, exhausted after session.     Follow Up Recommendations SNF (Pt hoping to go home if she is able to eat and feel better)    Equipment Recommendations       Recommendations for Other Services       Precautions / Restrictions Precautions Precautions: Fall Restrictions Weight Bearing Restrictions: No      Mobility  Bed Mobility Overal bed mobility: Modified Independent             General bed mobility comments: Pt was able to get to EOB w/o assist  Transfers Overall transfer level: Independent               General transfer comment: Pt was able to rise to standing both with and w/o AD.  She did need to use the counter to maintain balance in standing.   Ambulation/Gait Ambulation/Gait assistance: Supervision Ambulation Distance (Feet): 40 Feet Assistive device: Rolling walker (2 wheeled)       General Gait Details: Pt with slow, cautious gait.  Pt on 2 liters O2 t/o the session, generally fatigue but O2 remained in the 90s nearly the entire time.  Her HR was variable from the 70s to the 110s.  Pt very fatigued after minimal ambulation.  Stairs            Wheelchair Mobility    Modified Rankin (Stroke Patients Only)       Balance  Overall balance assessment: Needs assistance   Sitting balance-Leahy Scale: Good     Standing balance support: Bilateral upper extremity supported Standing balance-Leahy Scale: Fair Standing balance comment: Pt generally weak but was able to maintain balance relatively well with most acts                             Pertinent Vitals/Pain Pain Assessment:  (Reports vague pain with most activity)    Home Living Family/patient expects to be discharged to:: Private residence Living Arrangements: Alone Available Help at Discharge: Friend(s);Family Type of Home: House Home Access: Stairs to enter Entrance Stairs-Rails: Psychiatric nurse of Steps: 5-6 Home Layout: One level Home Equipment:  (O2)      Prior Function Level of Independence: Independent         Comments: Pt denies any falls in past 6 months.     Hand Dominance        Extremity/Trunk Assessment   Upper Extremity Assessment Upper Extremity Assessment: Overall WFL for tasks assessed    Lower Extremity Assessment Lower Extremity Assessment: Overall WFL for tasks assessed       Communication   Communication: No difficulties  Cognition Arousal/Alertness: Awake/alert Behavior During Therapy: Agitated;Impulsive;Restless  Overall Cognitive Status: Within Functional Limits for tasks assessed                                        General Comments      Exercises     Assessment/Plan    PT Assessment Patient needs continued PT services  PT Problem List Decreased strength;Decreased activity tolerance;Decreased balance;Decreased mobility;Decreased cognition;Decreased knowledge of use of DME;Decreased safety awareness;Cardiopulmonary status limiting activity;Pain       PT Treatment Interventions DME instruction;Gait training;Stair training;Functional mobility training;Therapeutic activities;Therapeutic exercise;Balance training;Patient/family education    PT  Goals (Current goals can be found in the Care Plan section)  Acute Rehab PT Goals Patient Stated Goal: Go home PT Goal Formulation: With patient Time For Goal Achievement: 05/11/17 Potential to Achieve Goals: Fair    Frequency Min 2X/week   Barriers to discharge        Co-evaluation               AM-PAC PT "6 Clicks" Daily Activity  Outcome Measure Difficulty turning over in bed (including adjusting bedclothes, sheets and blankets)?: None Difficulty moving from lying on back to sitting on the side of the bed? : A Little Difficulty sitting down on and standing up from a chair with arms (e.g., wheelchair, bedside commode, etc,.)?: A Little Help needed moving to and from a bed to chair (including a wheelchair)?: A Little Help needed walking in hospital room?: A Little Help needed climbing 3-5 steps with a railing? : A Little 6 Click Score: 19    End of Session Equipment Utilized During Treatment: Gait belt Activity Tolerance: Patient limited by fatigue Patient left: with bed alarm set;with call bell/phone within reach Nurse Communication: Mobility status PT Visit Diagnosis: Muscle weakness (generalized) (M62.81);Difficulty in walking, not elsewhere classified (R26.2)    Time: 7026-3785 PT Time Calculation (min) (ACUTE ONLY): 29 min   Charges:   PT Evaluation $PT Eval Low Complexity: 1 Procedure     PT G Codes:        Kreg Shropshire, DPT 04/27/2017, 5:34 PM

## 2017-04-27 NOTE — H&P (Signed)
Margaret Stevenson is an 71 y.o. female.   Chief Complaint: Nausea and vomiting HPI: The patient with past medical history of recent biliary stent placement for jaundice presents emergency department complaining of nausea and vomiting. The patient also reports that she has been weak. She is very frustrated that she has been unable to eat and then been instructed not to eat due to recurrent vomiting. Laboratory evaluation in the emergency department showed improving transaminitis as well as hyperbilirubinemia. However the patient's white blood cell count was elevated as well as heart rate and respiratory rate. Blood cultures were obtained in the emergency department and vancomycin initiated. Much to the patient's dismay she was told she needed admission to rule out potential infection and the hospitalist service was called for further management.  Past Medical History:  Diagnosis Date  . Cancer ([place])    melanoma 07/2013  . COPD (chronic obstructive pulmonary disease) ([place])   . Jaundice 04/2017    Past Surgical History:  Procedure Laterality Date  . BUNIONECTOMY    . TONSILLECTOMY      Family History  Problem Relation Age of Onset  . Breast cancer Mother 57   Social History:  reports that she has been smoking Cigarettes.  She has a 57.00 pack-year smoking history. She has never used smokeless tobacco. She reports that she does not drink alcohol or use drugs.  Allergies:  Allergies  Allergen Reactions  . Penicillins Hives    Has patient had a PCN reaction causing immediate rash, facial/tongue/throat swelling, SOB or lightheadedness with hypotension: no Has patient had a PCN reaction causing severe rash involving mucus membranes or skin necrosis: no Has patient had a PCN reaction that required hospitalization no Has patient had a PCN reaction occurring within the last 10 years: no If all of the above answers are "NO", then may proceed with Cephalosporin use.     Medications Prior to  Admission  Medication Sig Dispense Refill  . albuterol (VENTOLIN HFA) 108 (90 Base) MCG/ACT inhaler Inhale 2 puffs into the lungs every 4 (four) hours as needed for shortness of breath.     . Fluticasone-Salmeterol (ADVAIR) 250-50 MCG/DOSE AEPB Inhale 1 puff into the lungs 2 (two) times daily.    Marland Kitchen tiotropium (SPIRIVA) 18 MCG inhalation capsule Place 18 mcg into inhaler and inhale at bedtime.       Results for orders placed or performed during the hospital encounter of 04/27/17 (from the past 48 hour(s))  CBC     Status: Abnormal   Collection Time: 04/27/17 12:17 AM  Result Value Ref Range   WBC 15.4 (H) 3.6 - 11.0 K/uL   RBC 3.58 (L) 3.80 - 5.20 MIL/uL   Hemoglobin 11.3 (L) 12.0 - 16.0 g/dL   HCT 33.4 (L) 35.0 - 47.0 %   MCV 93.3 80.0 - 100.0 fL   MCH 31.5 26.0 - 34.0 pg   MCHC 33.8 32.0 - 36.0 g/dL   RDW 25.2 (H) 11.5 - 14.5 %   Platelets 418 150 - 440 K/uL  Comprehensive metabolic panel     Status: Abnormal   Collection Time: 04/27/17 12:17 AM  Result Value Ref Range   Sodium 136 135 - 145 mmol/L   Potassium 4.0 3.5 - 5.1 mmol/L   Chloride 98 (L) 101 - 111 mmol/L   CO2 28 22 - 32 mmol/L   Glucose, Bld 182 (H) 65 - 99 mg/dL   BUN 40 (H) 6 - 20 mg/dL   Creatinine, Ser 0.78 0.44 -  1.00 mg/dL   Calcium 9.0 8.9 - 10.3 mg/dL   Total Protein 6.4 (L) 6.5 - 8.1 g/dL   Albumin 3.2 (L) 3.5 - 5.0 g/dL   AST 219 (H) 15 - 41 U/L   ALT 249 (H) 14 - 54 U/L   Alkaline Phosphatase 136 (H) 38 - 126 U/L   Total Bilirubin 5.1 (H) 0.3 - 1.2 mg/dL   GFR calc non Af Amer >60 >60 mL/min   GFR calc Af Amer >60 >60 mL/min    Comment: (NOTE) The eGFR has been calculated using the CKD EPI equation. This calculation has not been validated in all clinical situations. eGFR's persistently <60 mL/min signify possible Chronic Kidney Disease.    Anion gap 10 5 - 15  Ammonia     Status: None   Collection Time: 04/27/17 12:17 AM  Result Value Ref Range   Ammonia 12 9 - 35 umol/L  Troponin I      Status: None   Collection Time: 04/27/17 12:17 AM  Result Value Ref Range   Troponin I <0.03 <0.03 ng/mL  TSH     Status: None   Collection Time: 04/27/17 12:17 AM  Result Value Ref Range   TSH 2.270 0.350 - 4.500 uIU/mL    Comment: Performed by a 3rd Generation assay with a functional sensitivity of <=0.01 uIU/mL.  Lactic acid, plasma     Status: None   Collection Time: 04/27/17  1:25 AM  Result Value Ref Range   Lactic Acid, Venous 1.7 0.5 - 1.9 mmol/L   Dg Chest Portable 1 View  Result Date: 04/27/2017 CLINICAL DATA:  Dyspnea. Biliary stent placement yesterday. Vomiting. EXAM: PORTABLE CHEST 1 VIEW COMPARISON:  Radiograph 10/11/2016 FINDINGS: Again seen hyperinflation. Linear scarring at the right lung base. Normal heart size and mediastinal contours with atherosclerosis of the thoracic aorta. Calcified granuloma in the left midlung. No consolidation, pleural fluid or pneumothorax. No acute osseous abnormality. IMPRESSION: Chronic hyperinflation suggesting COPD. Right lung base scarring. No acute abnormality. Thoracic aortic atherosclerosis. Electronically Signed   By: Jeb Levering M.D.   On: 04/27/2017 01:45   Dg C-arm 1-60 Min  Result Date: 04/25/2017 CLINICAL DATA:  Common bile duct stones. EXAM: DG C-ARM 61-120 MIN COMPARISON:  None. FINDINGS: Two images are submitted from the ERCP. A stricture is noted within the all common bile duct at the ampulla. A second image demonstrates CBD stent in place with decompression of the biliary system. No focal stones are present. IMPRESSION: 1. Distal CBD stricture. 2. Subsequent stenting of the common bile duct. Electronically Signed   By: San Morelle M.D.   On: 04/25/2017 15:01    Review of Systems  Constitutional: Negative for chills and fever.  HENT: Negative for sore throat and tinnitus.   Eyes: Negative for blurred vision and redness.  Respiratory: Negative for cough and shortness of breath.   Cardiovascular: Negative for  chest pain, palpitations, orthopnea and PND.  Gastrointestinal: Positive for melena, nausea and vomiting. Negative for abdominal pain and diarrhea.  Genitourinary: Negative for dysuria, frequency and urgency.  Musculoskeletal: Negative for joint pain and myalgias.  Skin: Negative for rash.       No lesions  Neurological: Positive for weakness. Negative for speech change and focal weakness.  Endo/Heme/Allergies: Does not bruise/bleed easily.       No temperature intolerance  Psychiatric/Behavioral: Negative for depression and suicidal ideas.    Blood pressure (!) 112/43, pulse 95, temperature 98.1 F (36.7 C), temperature source Oral, resp.  rate 18, height _0  (1.676 m), weight 62.1 kg (136 lb 12.8 oz), SpO2 100 %. Physical Exam  Vitals reviewed. Constitutional: She is oriented to person, place, and time. She appears well-developed and well-nourished. No distress.  HENT:  Head: Normocephalic and atraumatic.  Mouth/Throat: Oropharynx is clear and moist.  Eyes: Conjunctivae and EOM are normal. Pupils are equal, round, and reactive to light. No scleral icterus.  Neck: Normal range of motion. Neck supple. No JVD present. No tracheal deviation present. No thyromegaly present.  Cardiovascular: Normal rate, regular rhythm and normal heart sounds.  Exam reveals no gallop and no friction rub.   No murmur heard. Respiratory: Effort normal and breath sounds normal.  GI: Soft. Bowel sounds are normal. She exhibits no distension. There is no tenderness.  Genitourinary:  Genitourinary Comments: Deferred  Musculoskeletal: Normal range of motion. She exhibits no edema.  Lymphadenopathy:    She has no cervical adenopathy.  Neurological: She is alert and oriented to person, place, and time. No cranial nerve deficit. She exhibits normal muscle tone.  Skin: Skin is warm and dry. No rash noted. No erythema.  Psychiatric: She has a normal mood and affect. Her behavior is normal. Judgment and thought  content normal.     Assessment/Plan This is a 71 year old female admitted for sepsis. 1. Sepsis: The patient meets criteria via leukocytosis as well as tachycardia and tachypnea. She is hemodynamically stable. Follow blood cultures for growth and sensitivities. Continue vancomycin. I have added cefepime (penicillin allergy but patient tolerated cefepime on last admission). 2. Cholangitis: Secondary to instrumentation following ERCP and stent placement. The patient also reports some dark-colored stools. Hemoccult all stools. Gastroenterology consulted for follow-up 3. Nausea and vomiting: The patient reports 4 episodes of nonbloody nonbilious emesis. She has not had any vomiting since admission. Hydrate with intravenous fluid and provide symptomatic relief. 4. Tobacco abuse: The patient declines NicoDerm patch at this time. We may relabel her nicotine lozenges for hospital administration if she so desires. 5. COPD: Continue Spiriva and inhaled corticosteroid (the patient does not want to take Daneka Lantigua; we have relabeled Advair for hospital administration). 6. DVT prophylaxis: Lovenox 7. GI prophylaxis: None The patient is a full code. Time spent on admission orders and patient care approximately 45 minutes   Harrie Foreman, MD 04/27/2017, 6:37 AM

## 2017-04-27 NOTE — Progress Notes (Signed)
Patient verbalized frustration regarding adherence to policy and procedures as it related to use and storage of personal medications.  RN requested AC to address patient's concerns.  Patient's medications were taken to pharmacy to be labeled for use per MD order as requested.  RN reiterated how and when to call for assistance if needed.

## 2017-04-27 NOTE — Progress Notes (Signed)
[facility] NAME: Margaret Stevenson    MR#:  [id number]  DATE OF BIRTH:  Feb 25, 1946  SUBJECTIVE:  Frustrated since she wants to eat. No vomiting No fever  REVIEW OF SYSTEMS:   Review of Systems  Constitutional: Negative for chills, fever and weight loss.  HENT: Negative for ear discharge, ear pain and nosebleeds.   Eyes: Negative for blurred vision, pain and discharge.  Respiratory: Negative for sputum production, shortness of breath, wheezing and stridor.   Cardiovascular: Negative for chest pain, palpitations, orthopnea and PND.  Gastrointestinal: Positive for nausea. Negative for abdominal pain, diarrhea and vomiting.  Genitourinary: Negative for frequency and urgency.  Musculoskeletal: Negative for back pain and joint pain.  Neurological: Positive for weakness. Negative for sensory change, speech change and focal weakness.  Psychiatric/Behavioral: Negative for depression and hallucinations. The patient is not nervous/anxious.    Tolerating Diet:starting CLD Tolerating PT: PT pending  DRUG ALLERGIES:   Allergies  Allergen Reactions  . Penicillins Hives    Has patient had a PCN reaction causing immediate rash, facial/tongue/throat swelling, SOB or lightheadedness with hypotension: no Has patient had a PCN reaction causing severe rash involving mucus membranes or skin necrosis: no Has patient had a PCN reaction that required hospitalization no Has patient had a PCN reaction occurring within the last 10 years: no If all of the above answers are "NO", then may proceed with Cephalosporin use.     VITALS:  Blood pressure (!) 112/43, pulse 95, temperature 98.1 F (36.7 C), temperature source Oral, resp. rate 18, height 5\' 6"  (1.676 m), weight 62.1 kg (136 lb 12.8 oz), SpO2 100 %.  PHYSICAL EXAMINATION:   Physical Exam  GENERAL:  71 y.o.-year-old patient lying in the bed with no acute distress.  EYES: Pupils equal,  round, reactive to light and accommodation.++ scleral icterus. Extraocular muscles intact.  HEENT: Head atraumatic, normocephalic. Oropharynx and nasopharynx clear.  NECK:  Supple, no jugular venous distention. No thyroid enlargement, no tenderness.  LUNGS: Normal breath sounds bilaterally, no wheezing, rales, rhonchi. No use of accessory muscles of respiration.  CARDIOVASCULAR: S1, S2 normal. No murmurs, rubs, or gallops.  ABDOMEN: Soft, nontender, nondistended. Bowel sounds present. No organomegaly or mass.  EXTREMITIES: No cyanosis, clubbing or edema b/l.    NEUROLOGIC: Cranial nerves II through XII are intact. No focal Motor or sensory deficits b/l.   PSYCHIATRIC:  patient is alert and oriented x 3.  SKIN: No obvious rash, lesion, or ulcer.   LABORATORY PANEL:  CBC  Recent Labs Lab 04/27/17 0017  WBC 15.4*  HGB 11.3*  HCT 33.4*  PLT 418    Chemistries   Recent Labs Lab 04/27/17 0017  NA 136  K 4.0  CL 98*  CO2 28  GLUCOSE 182*  BUN 40*  CREATININE 0.78  CALCIUM 9.0  AST 219*  ALT 249*  ALKPHOS 136*  BILITOT 5.1*   Cardiac Enzymes  Recent Labs Lab 04/27/17 0017  TROPONINI <0.03   RADIOLOGY:  Dg Chest Portable 1 View  Result Date: 04/27/2017 CLINICAL DATA:  Dyspnea. Biliary stent placement yesterday. Vomiting. EXAM: PORTABLE CHEST 1 VIEW COMPARISON:  Radiograph 10/11/2016 FINDINGS: Again seen hyperinflation. Linear scarring at the right lung base. Normal heart size and mediastinal contours with atherosclerosis of the thoracic aorta. Calcified granuloma in the left midlung. No consolidation, pleural fluid or pneumothorax. No acute osseous abnormality. IMPRESSION: Chronic hyperinflation suggesting COPD. Right lung base scarring. No acute abnormality. Thoracic  aortic atherosclerosis. Electronically Signed   By: Jeb Levering M.D.   On: 04/27/2017 01:45   Dg C-arm 1-60 Min  Result Date: 04/25/2017 CLINICAL DATA:  Common bile duct stones. EXAM: DG C-ARM 61-120  MIN COMPARISON:  None. FINDINGS: Two images are submitted from the ERCP. A stricture is noted within the all common bile duct at the ampulla. A second image demonstrates CBD stent in place with decompression of the biliary system. No focal stones are present. IMPRESSION: 1. Distal CBD stricture. 2. Subsequent stenting of the common bile duct. Electronically Signed   By: San Morelle M.D.   On: 04/25/2017 15:01   ASSESSMENT AND PLAN:  71 year old female admitted for sepsis. 1. Sepsis: The patient meets criteria via leukocytosis as well as tachycardia and tachypnea. She is hemodynamically stable. - Follow blood cultures for growth and sensitivities. - Continue Iv cefepime  2. Suspected Cholangitis: s/p recent ERCP and stent placement.  -pt asymptomatic at present -no abd pain  3. Nausea and vomiting: The patient reports 4 episodes of nonbloody nonbilious emesis. She has not had any vomiting since admission. Hydrate with intravenous fluid and provide symptomatic relief. -resolved and now wants to eat  4. Tobacco abuse: The patient declines NicoDerm patch at this time  5. COPD: on chronic home oxygen -Continue Spiriva and inhaled corticosteroid (the patient does not want to take [place]; we have relabeled Advair for hospital administration).  6. DVT prophylaxis: Lovenox 7. PT to see pt  Case discussed with Care Management/Social Worker. Management plans discussed with the patient, family and they are in agreement.  CODE STATUS: full  DVT Prophylaxis: lovenox  TOTAL TIME TAKING CARE OF THIS PATIENT: *30* minutes.  >50% time spent on counselling and coordination of care  POSSIBLE D/C IN *1-2 DAYS, DEPENDING ON CLINICAL CONDITION.  Note: This dictation was prepared with Dragon dictation along with smaller phrase technology. Any transcriptional errors that result from this process are unintentional.  Teresa Nicodemus M.D on 04/27/2017 at 1:32 PM  Between 7am  to 6pm - Pager - [phone]  After 6pm go to [email or web address] - password [email or web address] [facility]  Office  [phone]  CC: Primary care physician; Dion Body, MD

## 2017-04-27 NOTE — ED Triage Notes (Signed)
Pt to rm 6 via EMS from home, report weakness s/p bile duct stent placement yesterday.  PT report vomiting as well and tachycardia.

## 2017-04-27 NOTE — ED Provider Notes (Signed)
[facility] Emergency Department Provider Note   First MD Initiated Contact with Patient 04/26/17 2358     (approximate)  I have reviewed the triage vital signs and the nursing notes.   HISTORY  Chief Complaint Weakness    HPI Margaret Stevenson is a 71 y.o. female with below list of chronic medical conditions including recent bile duct stent placement by Dr. Allen Norris presents to the emergency department with generalized weakness and very poor by mouth intake times one day. Patient states that she's had multiple episodes of vomiting today following attempts at eating. Patient states that emesis is dark however not coffee-ground. Patient denies any fever or afebrile on presentation temperature 98.4. Patient denies any pain   Past Medical History:  Diagnosis Date  . Cancer ([place])    melanoma 07/2013  . COPD (chronic obstructive pulmonary disease) ([place])   . Jaundice 04/2017    Patient Active Problem List   Diagnosis Date Noted  . Sepsis ([place]) 04/27/2017  . Disease of biliary tract   . Obstruction of bile duct   . Jaundice   . Hypoxia 10/11/2016  . Anxiety 10/11/2016  . Acute respiratory failure ([place]) 10/06/2016  . COPD exacerbation ([place]) 10/06/2016  . CAP (community acquired pneumonia) 10/06/2016  . Hyperglycemia 10/06/2016    Past Surgical History:  Procedure Laterality Date  . BUNIONECTOMY    . TONSILLECTOMY      Prior to Admission medications   Medication Sig Start Date End Date Taking? Authorizing Provider  albuterol (VENTOLIN HFA) 108 (90 Base) MCG/ACT inhaler Inhale 2 puffs into the lungs every 4 (four) hours as needed for shortness of breath.  08/27/16 08/27/17 Yes [provider]  Fluticasone-Salmeterol (ADVAIR) 250-50 MCG/DOSE AEPB Inhale 1 puff into the lungs 2 (two) times daily.   Yes [provider]  tiotropium (SPIRIVA) 18 MCG inhalation capsule Place 18 mcg into inhaler and inhale at bedtime.  08/27/16 08/27/17 Yes  [provider]    Allergies Penicillins  Family History  Problem Relation Age of Onset  . Breast cancer Mother 78    Social History Social History  Substance Use Topics  . Smoking status: Current Every Day Smoker    Packs/day: 1.00    Years: 57.00    Types: Cigarettes  . Smokeless tobacco: Never Used  . Alcohol use No    Review of Systems Constitutional: No fever/chills Eyes: No visual changes. ENT: No sore throat. Cardiovascular: Denies chest pain. Respiratory: Denies shortness of breath. Gastrointestinal: No abdominal pain.  Positive for nausea and vomiting Genitourinary: Negative for dysuria. Musculoskeletal: Negative for neck pain.  Negative for back pain. Integumentary: Negative for rash. Neurological: Negative for headaches, focal weakness or numbness.   ____________________________________________   PHYSICAL EXAM:  VITAL SIGNS: ED Triage Vitals  Enc Vitals Group     BP 04/27/17 0018 (!) 99/53     Pulse Rate 04/27/17 0018 (!) 118     Resp 04/27/17 0018 14     Temp 04/27/17 0018 98.4 F (36.9 C)     Temp Source 04/27/17 0018 Oral     SpO2 04/27/17 0018 99 %     Weight 04/27/17 0019 137 lb (62.1 kg)     Height 04/27/17 0019 5\' 6"  (1.676 m)     Head Circumference --      Peak Flow --      Pain Score 04/27/17 0004 0     Pain Loc --      Pain Edu? --  Excl. in [place]? --     Constitutional: Alert and oriented.  Eyes: Conjunctivae are normal. Scleral icterus Head: Atraumatic. Mouth/Throat: Mucous membranes are dry.  Oropharynx non-erythematous. Neck: No stridor.   Cardiovascular: Tachycardia, regular rhythm. Good peripheral circulation. Grossly normal heart sounds. Respiratory: Tachypnea.  No retractions. Lungs CTAB. Gastrointestinal: Soft and nontender. No distention.  Musculoskeletal: No lower extremity tenderness nor edema. No gross deformities of extremities. Neurologic:  Normal speech and language. No gross focal neurologic deficits  are appreciated.  Skin:  Skin is warm, dry and intact. Positive jaundice Psychiatric: Mood and affect are normal. Speech and behavior are normal.  ____________________________________________   LABS (all labs ordered are listed, but only abnormal results are displayed)  Labs Reviewed  CBC - Abnormal; Notable for the following:       Result Value   WBC 15.4 (*)    RBC 3.58 (*)    Hemoglobin 11.3 (*)    HCT 33.4 (*)    RDW 25.2 (*)    All other components within normal limits  COMPREHENSIVE METABOLIC PANEL - Abnormal; Notable for the following:    Chloride 98 (*)    Glucose, Bld 182 (*)    BUN 40 (*)    Total Protein 6.4 (*)    Albumin 3.2 (*)    AST 219 (*)    ALT 249 (*)    Alkaline Phosphatase 136 (*)    Total Bilirubin 5.1 (*)    All other components within normal limits  CULTURE, BLOOD (ROUTINE X 2)  CULTURE, BLOOD (ROUTINE X 2)  AMMONIA  TROPONIN I  LACTIC ACID, PLASMA  URINALYSIS, COMPLETE (UACMP) WITH MICROSCOPIC   ____________________________________________  EKG  ED ECG REPORT I, Cassia N Keeghan Mcintire, the attending physician, personally viewed and interpreted this ECG.   Date: 04/27/2017  EKG Time: 12:20 AM  Rate: 118  Rhythm: Sinus tachycardia  Axis: Normal  Intervals: Normal  ST&T Change:   ____________________________________________  RADIOLOGY I, [place] Ernst Bowler, personally viewed and evaluated these images (plain radiographs) as part of my medical decision making, as well as reviewing the written report by the radiologist.  Dg Chest Portable 1 View  Result Date: 04/27/2017 CLINICAL DATA:  Dyspnea. Biliary stent placement yesterday. Vomiting. EXAM: PORTABLE CHEST 1 VIEW COMPARISON:  Radiograph 10/11/2016 FINDINGS: Again seen hyperinflation. Linear scarring at the right lung base. Normal heart size and mediastinal contours with atherosclerosis of the thoracic aorta. Calcified granuloma in the left midlung. No consolidation, pleural fluid or  pneumothorax. No acute osseous abnormality. IMPRESSION: Chronic hyperinflation suggesting COPD. Right lung base scarring. No acute abnormality. Thoracic aortic atherosclerosis. Electronically Signed   By: Jeb Levering M.D.   On: 04/27/2017 01:45    ____________________________________________   PROCEDURES  Critical Care performed:CRITICAL CARE Performed by: Gregor Hams   Total critical care time: 30 minutes  Critical care time was exclusive of separately billable procedures and treating other patients.  Critical care was necessary to treat or prevent imminent or life-threatening deterioration.  Critical care was time spent personally by me on the following activities: development of treatment plan with patient and/or surrogate as well as nursing, discussions with consultants, evaluation of patient's response to treatment, examination of patient, obtaining history from patient or surrogate, ordering and performing treatments and interventions, ordering and review of laboratory studies, ordering and review of radiographic studies, pulse oximetry and re-evaluation of patient's condition.    Procedures   ____________________________________________   INITIAL IMPRESSION / ASSESSMENT AND PLAN / ED COURSE  Pertinent labs & imaging results that were available during my care of the patient were reviewed by me and considered in my medical decision making (see chart for details).  71year-old female status post biliary duct stent placement one day ago presents to the emergency department with tachycardia tachypnea, hypotension. Laboratory data revealed leukocytosis as such concern for possible sepsis hence sepsis protocol initiated. Patient's liver enzymes improved compared to previous lab data. Of note patient clinically dehydrated and a such 2 L IV normal saline was given.      ____________________________________________  FINAL CLINICAL IMPRESSION(S) / ED DIAGNOSES  Final  diagnoses:  Sepsis, due to unspecified organism [facility])  Dehydration     MEDICATIONS GIVEN DURING THIS VISIT:  Medications  sodium chloride 0.9 % bolus 1,000 mL (0 mLs Intravenous Stopped 04/27/17 0239)  sodium chloride 0.9 % bolus 1,000 mL (0 mLs Intravenous Stopped 04/27/17 0239)  vancomycin (VANCOCIN) IVPB 1000 mg/200 mL premix (0 mg Intravenous Stopped 04/27/17 0254)     NEW OUTPATIENT MEDICATIONS STARTED DURING THIS VISIT:  New Prescriptions   No medications on file    Modified Medications   No medications on file    Discontinued Medications   ALPRAZOLAM (XANAX) 1 MG TABLET    Take 1 tablet (1 mg total) by mouth 3 (three) times daily as needed for anxiety.   BENZONATATE (TESSALON) 100 MG CAPSULE    Take 1 capsule (100 mg total) by mouth 2 (two) times daily.   BISACODYL (DULCOLAX) 5 MG EC TABLET    Take 1 tablet (5 mg total) by mouth daily as needed for moderate constipation.   GUAIFENESIN (MUCINEX) 600 MG 12 HR TABLET    Take 1 tablet (600 mg total) by mouth 2 (two) times daily as needed.   IPRATROPIUM-ALBUTEROL (DUONEB) 0.5-2.5 (3) MG/3ML SOLN    Take 3 mLs by nebulization every 4 (four) hours.   MOMETASONE-FORMOTEROL (DULERA) 200-5 MCG/ACT AERO    Inhale 2 puffs into the lungs 2 (two) times daily.   MONTELUKAST (SINGULAIR) 10 MG TABLET    Take 1 tablet (10 mg total) by mouth at bedtime.     Note:  This document was prepared using Dragon voice recognition software and may include unintentional dictation errors.    Gregor Hams, MD 04/27/17 [phone]

## 2017-04-27 NOTE — Consult Note (Signed)
Consultation  Referring Provider:     No ref. provider found Primary Care Physician:  Dion Body, MD Primary Gastroenterologist:  Dr. Allen Norris         Reason for Consultation:     Emesis, poor po intake after ERCP  Date of Admission:  04/27/2017 Date of Consultation:  04/27/2017         HPI:   Margaret Stevenson is a 71 y.o. female with obstructive jaundice who underwent elective ERCP on 04/25/17. Day after procedure she had 4 episodes of non bloody emesis. She vomited coke, sprite that she had yesterday. She paged me last night and  I told her to go to ER as she is not able to tolerate PO. She denies f/c/abdominal pain. She noticed that her stools are dark, but Hb is stable here. She is started on empiric abx for possible cholangitis as WBC high and LFTs are elevated, but improved from prior to ERCP.   Past Medical History:  Diagnosis Date  . Cancer ([place])    melanoma 07/2013  . COPD (chronic obstructive pulmonary disease) ([place])   . Jaundice 04/2017    Past Surgical History:  Procedure Laterality Date  . BUNIONECTOMY    . TONSILLECTOMY      Prior to Admission medications   Medication Sig Start Date End Date Taking? Authorizing Provider  albuterol (VENTOLIN HFA) 108 (90 Base) MCG/ACT inhaler Inhale 2 puffs into the lungs every 4 (four) hours as needed for shortness of breath.  08/27/16 08/27/17 Yes [provider]  Fluticasone-Salmeterol (ADVAIR) 250-50 MCG/DOSE AEPB Inhale 1 puff into the lungs 2 (two) times daily.   Yes [provider]  tiotropium (SPIRIVA) 18 MCG inhalation capsule Place 18 mcg into inhaler and inhale at bedtime.  08/27/16 08/27/17 Yes [provider]    Family History  Problem Relation Age of Onset  . Breast cancer Mother 26     Social History  Substance Use Topics  . Smoking status: Current Every Day Smoker    Packs/day: 1.00    Years: 57.00    Types: Cigarettes  . Smokeless tobacco: Never Used  . Alcohol use No     Allergies as of 04/27/2017 - Review Complete 04/27/2017  Allergen Reaction Noted  . Penicillins Hives 08/02/2016    Review of Systems:    All systems reviewed and negative except where noted in HPI.   Physical Exam:  Vital signs in last 24 hours: Temp:  [98.1 F (36.7 C)-98.4 F (36.9 C)] 98.1 F (36.7 C) (05/20 0331) Pulse Rate:  [89-118] 95 (05/20 0331) Resp:  [14-24] 18 (05/20 0331) BP: (99-121)/(43-59) 112/43 (05/20 0331) SpO2:  [99 %-100 %] 100 % (05/20 0331) Weight:  [62.1 kg (136 lb 12.8 oz)-62.1 kg (137 lb)] 62.1 kg (136 lb 12.8 oz) (05/20 0500) Last BM Date:  (pt unsure) General: weak, cooperative in NAD, cachectic appearing Head:  Normocephalic and atraumatic. Eyes:   No icterus.   Conjunctiva pale. PERRLA. Neck:  Supple; no masses or thyroidomegaly Lungs: Respirations even and unlabored. Lungs clear to auscultation bilaterally.   No wheezes, crackles, or rhonchi.  Heart:  Regular rate and rhythm;  Without murmur, clicks, rubs or gallops Abdomen:  Soft, nondistended, nontender. Normal bowel sounds. No appreciable masses or hepatomegaly.  No rebound or guarding.  Rectal:  Not performed. Msk:  Symmetrical without gross deformities. Extremities:  Without edema, cyanosis or clubbing. Neurologic:  Alert and oriented x3;  grossly normal neurologically. Skin:  Intact without significant lesions  or rashes.  LAB RESULTS:  Recent Labs  04/27/17 0017  WBC 15.4*  HGB 11.3*  HCT 33.4*  PLT 418   BMET  Recent Labs  04/27/17 0017  NA 136  K 4.0  CL 98*  CO2 28  GLUCOSE 182*  BUN 40*  CREATININE 0.78  CALCIUM 9.0   LFT  Recent Labs  04/27/17 0017  PROT 6.4*  ALBUMIN 3.2*  AST 219*  ALT 249*  ALKPHOS 136*  BILITOT 5.1*   PT/INR No results for input(s): LABPROT, INR in the last 72 hours.  STUDIES: Dg Chest Portable 1 View  Result Date: 04/27/2017 CLINICAL DATA:  Dyspnea. Biliary stent placement yesterday. Vomiting. EXAM: PORTABLE CHEST 1 VIEW  COMPARISON:  Radiograph 10/11/2016 FINDINGS: Again seen hyperinflation. Linear scarring at the right lung base. Normal heart size and mediastinal contours with atherosclerosis of the thoracic aorta. Calcified granuloma in the left midlung. No consolidation, pleural fluid or pneumothorax. No acute osseous abnormality. IMPRESSION: Chronic hyperinflation suggesting COPD. Right lung base scarring. No acute abnormality. Thoracic aortic atherosclerosis. Electronically Signed   By: Jeb Levering M.D.   On: 04/27/2017 01:45   Dg C-arm 1-60 Min  Result Date: 04/25/2017 CLINICAL DATA:  Common bile duct stones. EXAM: DG C-ARM 61-120 MIN COMPARISON:  None. FINDINGS: Two images are submitted from the ERCP. A stricture is noted within the all common bile duct at the ampulla. A second image demonstrates CBD stent in place with decompression of the biliary system. No focal stones are present. IMPRESSION: 1. Distal CBD stricture. 2. Subsequent stenting of the common bile duct. Electronically Signed   By: San Morelle M.D.   On: 04/25/2017 15:01      Impression / Plan:   Margaret Stevenson is a 71 y.o. 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 Path pending. No clear evidence of post-ERCP pancreatitis. On empiric abs for cholangitis, it may be mild  - Continue empiric abx - Follow up blood cx - No indication for repeat ERCP and LFTs are improving - Trend LFTs daily - Add remeron to control nausea and anorexia - Dr Allen Norris is made aware  Thank you for involving me in the care of this patient.      LOS: 0 days   Sherri Sear, MD  04/27/2017, 12:20 PM

## 2017-04-27 NOTE — Progress Notes (Signed)
Pharmacy Antibiotic Note  Margaret Stevenson is a 71 y.o. female admitted on 04/27/2017 with sepsis.  Pharmacy has been consulted for cefepime dosing.  Plan: Cefepime 2 grams q 12 hours ordered.  Height: 5\' 6"  (167.6 cm) Weight: 137 lb (62.1 kg) IBW/kg (Calculated) : 59.3  Temp (24hrs), Avg:98.3 F (36.8 C), Min:98.1 F (36.7 C), Max:98.4 F (36.9 C)   Recent Labs Lab 04/27/17 0017 04/27/17 0125  WBC 15.4*  --   CREATININE 0.78  --   LATICACIDVEN  --  1.7    Estimated Creatinine Clearance: 61.3 mL/min (by C-G formula based on SCr of 0.78 mg/dL).    Allergies  Allergen Reactions  . Penicillins Hives    Has patient had a PCN reaction causing immediate rash, facial/tongue/throat swelling, SOB or lightheadedness with hypotension: no Has patient had a PCN reaction causing severe rash involving mucus membranes or skin necrosis: no Has patient had a PCN reaction that required hospitalization no Has patient had a PCN reaction occurring within the last 10 years: no If all of the above answers are "NO", then may proceed with Cephalosporin use.     Antimicrobials this admission: Vancomycin x1 cefepime 5/20 >>    >>   Dose adjustments this admission:   Microbiology results: 5/20 BCx: pending      5/20 CXR: no acute abnormality Thank you for allowing pharmacy to be a part of this patient's care.  Steffen Hase S 04/27/2017 4:29 AM

## 2017-04-28 DIAGNOSIS — A419 Sepsis, unspecified organism: Secondary | ICD-10-CM | POA: Diagnosis not present

## 2017-04-28 DIAGNOSIS — E860 Dehydration: Secondary | ICD-10-CM | POA: Diagnosis not present

## 2017-04-28 LAB — CBC
HEMATOCRIT: 23.2 % — AB (ref 35.0–47.0)
HEMOGLOBIN: 8 g/dL — AB (ref 12.0–16.0)
MCH: 32 pg (ref 26.0–34.0)
MCHC: 34.4 g/dL (ref 32.0–36.0)
MCV: 93 fL (ref 80.0–100.0)
Platelets: 330 10*3/uL (ref 150–440)
RBC: 2.49 MIL/uL — ABNORMAL LOW (ref 3.80–5.20)
RDW: 25.5 % — AB (ref 11.5–14.5)
WBC: 6.7 10*3/uL (ref 3.6–11.0)

## 2017-04-28 LAB — HEPATIC FUNCTION PANEL
ALK PHOS: 96 U/L (ref 38–126)
ALT: 154 U/L — ABNORMAL HIGH (ref 14–54)
AST: 135 U/L — AB (ref 15–41)
Albumin: 2.6 g/dL — ABNORMAL LOW (ref 3.5–5.0)
BILIRUBIN TOTAL: 3 mg/dL — AB (ref 0.3–1.2)
Bilirubin, Direct: 1.5 mg/dL — ABNORMAL HIGH (ref 0.1–0.5)
Indirect Bilirubin: 1.5 mg/dL — ABNORMAL HIGH (ref 0.3–0.9)
Total Protein: 5 g/dL — ABNORMAL LOW (ref 6.5–8.1)

## 2017-04-28 LAB — HEMOGLOBIN A1C
Hgb A1c MFr Bld: 4.5 % — ABNORMAL LOW (ref 4.8–5.6)
Mean Plasma Glucose: 82 mg/dL

## 2017-04-28 LAB — CYTOLOGY - NON PAP

## 2017-04-28 NOTE — Progress Notes (Signed)
04/28/2017  4:28 PM  Ellie Lunch to be D/C'd Home per MD order.  Discussed prescriptions and follow up appointments with the patient. Prescriptions given to patient, medication list explained in detail. Pt verbalized understanding.  Allergies as of 04/28/2017      Reactions   Penicillins Hives   Has patient had a PCN reaction causing immediate rash, facial/tongue/throat swelling, SOB or lightheadedness with hypotension: no Has patient had a PCN reaction causing severe rash involving mucus membranes or skin necrosis: no Has patient had a PCN reaction that required hospitalization no Has patient had a PCN reaction occurring within the last 10 years: no If all of the above answers are "NO", then may proceed with Cephalosporin use.      Medication List    TAKE these medications   Fluticasone-Salmeterol 250-50 MCG/DOSE Aepb Commonly known as:  ADVAIR Inhale 1 puff into the lungs 2 (two) times daily.   tiotropium 18 MCG inhalation capsule Commonly known as:  SPIRIVA Place 18 mcg into inhaler and inhale at bedtime.   VENTOLIN HFA 108 (90 Base) MCG/ACT inhaler Generic drug:  albuterol Inhale 2 puffs into the lungs every 4 (four) hours as needed for shortness of breath.            Durable Medical Equipment        Start     Ordered   04/28/17 1235  For home use only DME 4 wheeled rolling walker with seat  Once    Question:  Patient needs a walker to treat with the following condition  Answer:  Weakness   04/28/17 1235      Vitals:   04/28/17 0507 04/28/17 1100  BP: (!) 108/55   Pulse: 90 (!) 105  Resp: 20   Temp: 98.1 F (36.7 C)     Skin clean, dry and intact without evidence of skin break down, no evidence of skin tears noted. IV catheter discontinued intact. Site without signs and symptoms of complications. Dressing and pressure applied. Pt denies pain at this time. No complaints noted.  An After Visit Summary was printed and given to the patient. Patient  escorted via [place], and D/C home via private auto.  Dola Argyle

## 2017-04-28 NOTE — Progress Notes (Signed)
Physical Therapy Treatment Patient Details Name: Margaret Stevenson MRN: [id number] DOB: [birth year] Today's Date: 04/28/2017    History of Present Illness 71 y/o female here with recent nausea/vomity and weakness as she has been unable to eat.  Until ~6 months ago was indepdendent.  past medical history of recent biliary stent placement for jaundice.    PT Comments    Pt agreeable to PT; denies pain. Pt notes her biggest concern with going home is readily having a seat available if she becomes tired/weak and needs to sit immediately. At baseline, pt does not use an assistive device. Discussed use of a rollator, so that pt would have seat available with ambulation and static stand activities (household chores) without need to drag chairs around. Pt demonstrates safe practice with rollator for ambulation, use of brakes and braking to allow transfer to sit. Pt ambulates 115 ft and 130 ft on 3 liters with O2 saturation 94-95%, only mild increase in heart rate safely with ability to sit upon fatigue. Pt demonstrates safe stair climbing using step to pattern for energy conservation. Recommend discharge to [place] with rollator. Discussed with SW; paged MD, whom requested update, but at this time have not spoken with MD. Pt has completed all goals; would need updated goals if not discharged today.    Follow Up Recommendations  Home health PT     Equipment Recommendations  Other (comment) (rollator)    Recommendations for Other Services       Precautions / Restrictions Precautions Precautions: Fall Restrictions Weight Bearing Restrictions: No    Mobility  Bed Mobility Overal bed mobility: Independent                Transfers Overall transfer level: Independent               General transfer comment: Performs STS without and with AD with good safety and balance  Ambulation/Gait Ambulation/Gait assistance: Modified independent (Device/Increase time);Supervision Ambulation  Distance (Feet): 115 Feet (second walk, 130 ft; also to/from bathroom no AD) Assistive device: 4-wheeled walker Gait Pattern/deviations: WFL(Within Functional Limits)   Gait velocity interpretation: at or above normal speed for age/gender General Gait Details: O2 saturation throughout ambulation 94-95% on 3L. Ambulation safe with rollator and demonstrates proper knowledge/safe handling of brakes to allow seated rest   Stairs Stairs: Yes   Stair Management: One rail Left;Forwards;Step to pattern Number of Stairs: 4 General stair comments: Pt notes she can perform reciprocally most of the time  Wheelchair Mobility    Modified Rankin (Stroke Patients Only)       Balance Overall balance assessment: Modified Independent                                          Cognition Arousal/Alertness: Awake/alert Behavior During Therapy: WFL for tasks assessed/performed Overall Cognitive Status: Within Functional Limits for tasks assessed                                        Exercises      General Comments        Pertinent Vitals/Pain Pain Assessment: No/denies pain    Home Living                      Prior Function  PT Goals (current goals can now be found in the care plan section)      Frequency    Min 2X/week      PT Plan Discharge plan needs to be updated    Co-evaluation              AM-PAC PT "6 Clicks" Daily Activity  Outcome Measure  Difficulty turning over in bed (including adjusting bedclothes, sheets and blankets)?: None Difficulty moving from lying on back to sitting on the side of the bed? : None Difficulty sitting down on and standing up from a chair with arms (e.g., wheelchair, bedside commode, etc,.)?: None Help needed moving to and from a bed to chair (including a wheelchair)?: None Help needed walking in hospital room?: None Help needed climbing 3-5 steps with a railing? : None 6  Click Score: 24    End of Session Equipment Utilized During Treatment: Oxygen Activity Tolerance: Patient tolerated treatment well     PT Visit Diagnosis: Muscle weakness (generalized) (M62.81);Difficulty in walking, not elsewhere classified (R26.2)     Time: 1117-1203 PT Time Calculation (min) (ACUTE ONLY): 46 min  Charges:  $Gait Training: 23-37 mins $Therapeutic Exercise: 8-22 mins                    G Codes:        Larae Grooms, PTA 04/28/2017, 1:09 PM

## 2017-04-28 NOTE — Care Management Important Message (Signed)
Important Message  Patient Details  Name: Margaret Stevenson MRN: [id number] Date of Birth: 02/10/1946   Medicare Important Message Given:  N/A - LOS <3 / Initial given by admissions    Beverly Sessions, RN 04/28/2017, 1:53 PM

## 2017-04-28 NOTE — Care Management (Signed)
Patient admitted with sepsis.  Patient lives at home alone.  PCP [place].  Chronic home O2 3 liters through [facility].  Patient has been assessed by PT, and recommends home health PT.  Home health orders have been placed for PT, RN, and rollator.  Patient was provided home health agency preference.  Patient states that she does not have a preference of agency.  Referral made to [facility] with [place].  Patient has her portable tank at bedside for transport.  Neighbor to transport at discharge.  Patient states that her son will be coming in from out of town at [employer].  RNCM signing off.

## 2017-04-28 NOTE — Discharge Summary (Signed)
[facility] at [place] NAME: Margaret Stevenson    MR#:  [id number]  DATE OF BIRTH:  1946/01/12  DATE OF ADMISSION:  04/27/2017 ADMITTING PHYSICIAN: Harrie Foreman, MD  DATE OF DISCHARGE: 04/28/17  PRIMARY CARE PHYSICIAN: Dion Body, MD    ADMISSION DIAGNOSIS:  Dehydration [E86.0] Sepsis, due to unspecified organism [facility]) [A41.9]  DISCHARGE DIAGNOSIS:  Intractable nausea and vomiting post ERCP-resolved COPD/Chronic respiratory failure on chronic home oxygen Painless obstructive Jaundice s/p ERCP with stent placement on 04/25/2017  SECONDARY DIAGNOSIS:   Past Medical History:  Diagnosis Date  . Cancer ([place])    melanoma 07/2013  . COPD (chronic obstructive pulmonary disease) ([place])   . Jaundice 04/2017    HOSPITAL COURSE:  71 year old female admitted for sepsis. 1. Sepsis: The patient meets criteria via leukocytosis as well as tachycardia and tachypnea. She is hemodynamically stable. -sepsis resovled - Follow blood cultures for growth and sensitivities are negative - recieved  Iv cefepime---no abxs at d/c -wbc 6.7, no fever  2. Suspected Cholangitis: s/p recent ERCP and stent placement.  -pt asymptomatic at present -no abd pain, abxs d/ced  3. Nausea and vomiting:resolved  received symptomatic management -resolved and now wants to eat  4. Tobacco abuse: The patient declines NicoDerm patch at this time  5. COPD: on chronic home oxygen -Continue Spiriva and inhaled corticosteroid (the patient does not want to take [place]; we have relabeled Advair for hospital administration).  6. DVT prophylaxis: Lovenox 7. PT HHPT  D/c home  CONSULTS OBTAINED:  Treatment Team:  Lin Landsman, MD  DRUG ALLERGIES:   Allergies  Allergen Reactions  . Penicillins Hives    Has patient had a PCN reaction causing immediate rash, facial/tongue/throat swelling, SOB or lightheadedness with  hypotension: no Has patient had a PCN reaction causing severe rash involving mucus membranes or skin necrosis: no Has patient had a PCN reaction that required hospitalization no Has patient had a PCN reaction occurring within the last 10 years: no If all of the above answers are "NO", then may proceed with Cephalosporin use.     DISCHARGE MEDICATIONS:   Current Discharge Medication List    CONTINUE these medications which have NOT CHANGED   Details  albuterol (VENTOLIN HFA) 108 (90 Base) MCG/ACT inhaler Inhale 2 puffs into the lungs every 4 (four) hours as needed for shortness of breath.     Fluticasone-Salmeterol (ADVAIR) 250-50 MCG/DOSE AEPB Inhale 1 puff into the lungs 2 (two) times daily.    tiotropium (SPIRIVA) 18 MCG inhalation capsule Place 18 mcg into inhaler and inhale at bedtime.         If you experience worsening of your admission symptoms, develop shortness of breath, life threatening emergency, suicidal or homicidal thoughts you must seek medical attention immediately by calling 911 or calling your MD immediately  if symptoms less severe.  You Must read complete instructions/literature along with all the possible adverse reactions/side effects for all the Medicines you take and that have been prescribed to you. Take any new Medicines after you have completely understood and accept all the possible adverse reactions/side effects.   Please note  You were cared for by a hospitalist during your hospital stay. If you have any questions about your discharge medications or the care you received while you were in the hospital after you are discharged, you can call the unit and asked to speak with the hospitalist on call if the hospitalist  that took care of you is not available. Once you are discharged, your primary care physician will handle any further medical issues. Please note that NO REFILLS for any discharge medications will be authorized once you are discharged, as it is  imperative that you return to your primary care physician (or establish a relationship with a primary care physician if you do not have one) for your aftercare needs so that they can reassess your need for medications and monitor your lab values. Today   SUBJECTIVE    Doing well VITAL SIGNS:  Blood pressure (!) 108/55, pulse (!) 105, temperature 98.1 F (36.7 C), temperature source Oral, resp. rate 20, height 5\' 6"  (1.676 m), weight 62.4 kg (137 lb 8 oz), SpO2 97 %.  I/O:   Intake/Output Summary (Last 24 hours) at 04/28/17 1319 Last data filed at 04/28/17 0857  Gross per 24 hour  Intake          2334.25 ml  Output              700 ml  Net          1634.25 ml    PHYSICAL EXAMINATION:  GENERAL:  71 y.o.-year-old patient lying in the bed with no acute distress.  EYES: Pupils equal, round, reactive to light and accommodation. +scleral icterus. Extraocular muscles intact.  HEENT: Head atraumatic, normocephalic. Oropharynx and nasopharynx clear.  NECK:  Supple, no jugular venous distention. No thyroid enlargement, no tenderness.  LUNGS: Normal breath sounds bilaterally, no wheezing, rales,rhonchi or crepitation. No use of accessory muscles of respiration.  CARDIOVASCULAR: S1, S2 normal. No murmurs, rubs, or gallops.  ABDOMEN: Soft, non-tender, non-distended. Bowel sounds present. No organomegaly or mass.  EXTREMITIES: No pedal edema, cyanosis, or clubbing.  NEUROLOGIC: Cranial nerves II through XII are intact. Muscle strength 5/5 in all extremities. Sensation intact. Gait not checked.  PSYCHIATRIC: The patient is alert and oriented x 3.  SKIN: No obvious rash, lesion, or ulcer.   DATA REVIEW:   CBC   Recent Labs Lab 04/28/17 0724  WBC 6.7  HGB 8.0*  HCT 23.2*  PLT 330    Chemistries   Recent Labs Lab 04/27/17 0017 04/28/17 0724  NA 136  --   K 4.0  --   CL 98*  --   CO2 28  --   GLUCOSE 182*  --   BUN 40*  --   CREATININE 0.78  --   CALCIUM 9.0  --   AST 219*  135*  ALT 249* 154*  ALKPHOS 136* 96  BILITOT 5.1* 3.0*    Microbiology Results   Recent Results (from the past 240 hour(s))  Blood Culture (routine x 2)     Status: None (Preliminary result)   Collection Time: 04/27/17  1:25 AM  Result Value Ref Range Status   Specimen Description BLOOD LEFT ANTECUBITAL  Final   Special Requests   Final    BOTTLES DRAWN AEROBIC AND ANAEROBIC Blood Culture adequate volume   Culture NO GROWTH 1 DAY  Final   Report Status PENDING  Incomplete  Blood Culture (routine x 2)     Status: None (Preliminary result)   Collection Time: 04/27/17  1:42 AM  Result Value Ref Range Status   Specimen Description BLOOD LEFT ANTECUBITAL  Final   Special Requests   Final    BOTTLES DRAWN AEROBIC AND ANAEROBIC Blood Culture results may not be optimal due to an excessive volume of blood received in culture bottles   Culture  NO GROWTH 1 DAY  Final   Report Status PENDING  Incomplete    RADIOLOGY:  Dg Chest Portable 1 View  Result Date: 04/27/2017 CLINICAL DATA:  Dyspnea. Biliary stent placement yesterday. Vomiting. EXAM: PORTABLE CHEST 1 VIEW COMPARISON:  Radiograph 10/11/2016 FINDINGS: Again seen hyperinflation. Linear scarring at the right lung base. Normal heart size and mediastinal contours with atherosclerosis of the thoracic aorta. Calcified granuloma in the left midlung. No consolidation, pleural fluid or pneumothorax. No acute osseous abnormality. IMPRESSION: Chronic hyperinflation suggesting COPD. Right lung base scarring. No acute abnormality. Thoracic aortic atherosclerosis. Electronically Signed   By: Jeb Levering M.D.   On: 04/27/2017 01:45     Management plans discussed with the patient, family and they are in agreement.  CODE STATUS:     Code Status Orders        Start     Ordered   04/27/17 0341  Full code  Continuous     04/27/17 0340    Code Status History    Date Active Date Inactive Code Status Order ID Comments User Context    04/18/2017  5:28 PM 04/19/2017  8:58 PM Full Code [id number]  Velvet Bathe, MD Inpatient   10/11/2016 11:16 PM 10/18/2016  3:19 PM Full Code [id number]  Lance Coon, MD Inpatient   10/06/2016  7:07 PM 10/10/2016  3:02 PM Full Code [id number]  Idelle Crouch, MD ED    Advance Directive Documentation     Most Recent Value  Type of Advance Directive  Healthcare Power of [place], Living will  Pre-existing out of facility DNR order (yellow form or pink MOST form)  -  "MOST" Form in Place?  -      TOTAL TIME TAKING CARE OF THIS PATIENT: 40 minutes.    Linsi Humann M.D on 04/28/2017 at 1:19 PM  Between 7am to 6pm - Pager - [phone] After 6pm go to [email or web address] - password [email or web address] [facility]  Office  [phone]  CC: Primary care physician; Dion Body, MD

## 2017-04-28 NOTE — Progress Notes (Signed)
Nutrition Brief Note  Patient identified on the Malnutrition Screening Tool (MST) Report  Wt Readings from Last 15 Encounters:  04/28/17 137 lb 8 oz (62.4 kg)  04/18/17 141 lb (64 kg)  04/18/17 141 lb (64 kg)  10/28/16 138 lb 4.8 oz (62.7 kg)  10/11/16 142 lb (64.4 kg)  10/10/16 142 lb 1.6 oz (64.5 kg)   Attempted to meet with patient at bedside. She was visibly upset to have RD visit and after this RD asked patient's full name and birth date she asked me to "leave the door ajar on the way out." She reports for a few days she has not been eating well, but reports she does not need any assistance from RD. Reports she has not weighed herself in a decade and so is unsure of weight history. Per chart she was 142.1 lbs on 10/10/2016 and has lost 4.6 lbs (3.2% body weight) over the past 6 months, which is not significant for time frame.  Patient declined NFPE. Per chart she had an RD visit on 04/19/2017 finding mild fat and mild muscle depletion.   Unable to determine if patient meets criteria for malnutrition at this time due to refusal of assessment/exam. Reports she is being discharged soon and does not need any nutrition interventions.  Body mass index is 22.19 kg/m. Patient meets criteria for normal weight based on current BMI.   Current diet order is soft, patient is consuming approximately 50-75% of meals at this time. Labs and medications reviewed.   No nutrition interventions warranted at this time. If nutrition issues arise, please consult RD.   Willey Blade, MS, RD, LDN Pager: [phone] After Hours Pager: [phone]

## 2017-04-30 LAB — SURGICAL PATHOLOGY

## 2017-05-01 ENCOUNTER — Telehealth: Payer: Self-pay

## 2017-05-01 ENCOUNTER — Encounter: Payer: Self-pay | Admitting: Gastroenterology

## 2017-05-01 NOTE — Telephone Encounter (Signed)
-----   Message from Lucilla Lame, MD sent at 04/28/2017  6:27 PM EDT ----- Let the patient know that the brushings of the bile duct did not show any cancer cells present.  We are waiting for the biopsies.

## 2017-05-01 NOTE — Telephone Encounter (Signed)
LVM for pt to return my call.

## 2017-05-01 NOTE — Telephone Encounter (Signed)
Pt notified of bile duct brushing results.

## 2017-05-02 LAB — CULTURE, BLOOD (ROUTINE X 2)
CULTURE: NO GROWTH
Culture: NO GROWTH
SPECIAL REQUESTS: ADEQUATE

## 2017-05-07 DIAGNOSIS — R7303 Prediabetes: Secondary | ICD-10-CM | POA: Insufficient documentation

## 2017-05-07 DIAGNOSIS — Z86010 Personal history of colonic polyps: Secondary | ICD-10-CM | POA: Insufficient documentation

## 2017-05-07 DIAGNOSIS — F17200 Nicotine dependence, unspecified, uncomplicated: Secondary | ICD-10-CM | POA: Insufficient documentation

## 2017-05-07 DIAGNOSIS — M1990 Unspecified osteoarthritis, unspecified site: Secondary | ICD-10-CM | POA: Insufficient documentation

## 2017-05-07 DIAGNOSIS — J45909 Unspecified asthma, uncomplicated: Secondary | ICD-10-CM | POA: Insufficient documentation

## 2017-05-08 ENCOUNTER — Other Ambulatory Visit
Admission: RE | Admit: 2017-05-08 | Discharge: 2017-05-08 | Disposition: A | Discharge status: Home / Self Care | Payer: Medicare Other | Source: Ambulatory Visit | Attending: Gastroenterology | Admitting: Gastroenterology

## 2017-05-08 ENCOUNTER — Ambulatory Visit (INDEPENDENT_AMBULATORY_CARE_PROVIDER_SITE_OTHER): Payer: Medicare Other | Admitting: Gastroenterology

## 2017-05-08 ENCOUNTER — Other Ambulatory Visit: Payer: Self-pay

## 2017-05-08 ENCOUNTER — Encounter: Payer: Self-pay | Admitting: Gastroenterology

## 2017-05-08 ENCOUNTER — Telehealth: Payer: Self-pay

## 2017-05-08 VITALS — BP 120/67 | HR 98 | Temp 99.1°F | Ht 67.0 in | Wt 137.0 lb

## 2017-05-08 DIAGNOSIS — R978 Other abnormal tumor markers: Secondary | ICD-10-CM | POA: Diagnosis present

## 2017-05-08 DIAGNOSIS — Z86010 Personal history of colonic polyps: Secondary | ICD-10-CM

## 2017-05-08 DIAGNOSIS — C801 Malignant (primary) neoplasm, unspecified: Secondary | ICD-10-CM | POA: Insufficient documentation

## 2017-05-08 DIAGNOSIS — R17 Unspecified jaundice: Secondary | ICD-10-CM

## 2017-05-08 DIAGNOSIS — R197 Diarrhea, unspecified: Secondary | ICD-10-CM

## 2017-05-08 DIAGNOSIS — C241 Malignant neoplasm of ampulla of Vater: Secondary | ICD-10-CM | POA: Insufficient documentation

## 2017-05-08 MED ORDER — DIPHENOXYLATE-ATROPINE 2.5-0.025 MG PO TABS: 1.0000 | ORAL_TABLET | Freq: Four times a day (QID) | ORAL | 3 refills | Status: DC | PRN

## 2017-05-08 MED ORDER — NA SULFATE-K SULFATE-MG SULF 17.5-3.13-1.6 GM/177ML PO SOLN: 1.0000 | ORAL | 0 refills | Status: DC

## 2017-05-08 NOTE — Telephone Encounter (Signed)
Pt has a follow up appt with Dr. Allen Norris today to discuss pathology results. [facility] referral has been faxed. Pt will be contacted for an appt.

## 2017-05-08 NOTE — Progress Notes (Signed)
Primary Care Physician: Dion Body, MD  Primary Gastroenterologist:  Dr. Lucilla Lame  Chief Complaint  Patient presents with  . Jaundice  . elevated ca    HPI: Margaret Stevenson is a 71 y.o. female here for follow-up after an ERCP.  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 The patient reports that she has been told by her friends that her jaundice has decreased.  The patient was found to have an elevated alpha-fetoprotein and CA 19-9.  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  The patient also reports that she has a history of polyps and is due for repeat colonoscopy.  Current Outpatient Prescriptions  Medication Sig Dispense Refill  . albuterol (VENTOLIN HFA) 108 (90 Base) MCG/ACT inhaler Inhale 2 puffs into the lungs every 4 (four) hours as needed for shortness of breath.     . diphenoxylate-atropine (LOMOTIL) 2.5-0.025 MG tablet Take 1 tablet by mouth 4 (four) times daily as needed for diarrhea or loose stools. 60 tablet 3  . Fluticasone-Salmeterol (ADVAIR) 250-50 MCG/DOSE AEPB Inhale 1 puff into the lungs 2 (two) times daily.    Marland Kitchen tiotropium (SPIRIVA) 18 MCG inhalation capsule Place 18 mcg into inhaler and inhale at bedtime.      No current facility-administered medications for this visit.     Allergies as of 05/08/2017 - Review Complete 05/08/2017  Allergen Reaction Noted  . Penicillins Hives 08/02/2016    ROS:  General: Negative for anorexia, weight loss, fever, chills, fatigue, weakness. ENT: Negative for hoarseness, difficulty swallowing , nasal congestion. CV: Negative for chest pain, angina, palpitations, dyspnea on exertion, peripheral edema.  Respiratory: Negative for dyspnea at rest, dyspnea on exertion, cough, sputum, wheezing.  GI: See history of present illness. GU:  Negative for dysuria, hematuria, urinary incontinence, urinary frequency, nocturnal urination.  Endo: Negative for  unusual weight change.    Physical Examination:   BP 120/67   Pulse 98   Temp 99.1 F (37.3 C) (Oral)   Ht 5\' 7"  (1.702 m)   Wt 137 lb (62.1 kg)   BMI 21.46 kg/m   General: Well-nourished, well-developed in no acute distress.  Eyes: No icterus. Conjunctivae pink. Extremities: No lower extremity edema. No clubbing or deformities. Neuro: Alert and oriented x 3.  Grossly intact. Skin: Warm and dry, no jaundice.   Psych: Alert and cooperative, normal mood and affect.  Labs:    Imaging Studies: Ct Abdomen Pelvis W Contrast  Result Date: 04/17/2017 CLINICAL DATA:  Acute onset of jaundice and lack of appetite. Vomiting. Initial encounter. EXAM: CT ABDOMEN AND PELVIS WITH CONTRAST TECHNIQUE: Multidetector CT imaging of the abdomen and pelvis was performed using the standard protocol following bolus administration of intravenous contrast. CONTRAST:  126mL ISOVUE-300 IOPAMIDOL (ISOVUE-300) INJECTION 61% COMPARISON:  None. FINDINGS: Lower chest: Bibasilar atelectasis or scarring is noted. The visualized portions of the mediastinum are unremarkable. Hepatobiliary: The liver is grossly unremarkable in appearance. There is diffuse soft tissue inflammation about the porta hepatis, with pericholecystic fluid and mild gallbladder wall edema, concerning for acute cholecystitis. The gallbladder is somewhat distended. There appear to be stones lodged at the neck of the gallbladder. The common bile duct appears normal in caliber. A mildly prominent 1.2 cm node is noted anterior to the infrahepatic IVC. Pancreas: The pancreas is within normal limits. Spleen: The spleen is unremarkable in appearance. Adrenals/Urinary Tract: The adrenal glands are unremarkable in appearance. Mild right renal  scarring is noted. The kidneys are otherwise unremarkable. There is no evidence of hydronephrosis. No renal or ureteral stones are identified. No significant perinephric stranding is appreciated. Stomach/Bowel: The stomach is  unremarkable in appearance. The small bowel is within normal limits. The appendix is normal in caliber, without evidence of appendicitis. Vague segmental wall thickening is noted at the distal transverse colon. An underlying mass cannot be entirely excluded. Scattered diverticulosis is noted along the sigmoid colon, without evidence of diverticulitis. Vascular/Lymphatic: Scattered calcification is seen along the abdominal aorta and its branches. The abdominal aorta is otherwise grossly unremarkable. The inferior vena cava is grossly unremarkable. No retroperitoneal lymphadenopathy is seen. No pelvic sidewall lymphadenopathy is identified. Reproductive: The bladder is mildly distended and within normal limits. The uterus is grossly unremarkable in appearance. The ovaries are relatively symmetric. No suspicious adnexal masses are seen. Other: No additional soft tissue abnormalities are seen. Musculoskeletal: No acute osseous abnormalities are identified. Multilevel vacuum phenomenon is noted along the lumbar spine, with underlying endplate sclerotic change. The visualized musculature is unremarkable in appearance. IMPRESSION: 1. Diffuse soft tissue inflammation about the porta hepatis, likely reflecting cholangitis, with mild gallbladder wall edema and pericholecystic fluid, concerning for acute cholecystitis. Gallbladder somewhat distended in appearance. Apparent stones lodged at the neck of the gallbladder. Mildly prominent adjacent node noted. Underlying mass is considered unlikely, but cannot be entirely excluded. Would correlate with the chronicity of the patient's symptoms. 2. Vague segmental wall thickening at the distal transverse colon. Underlying mass cannot be entirely excluded, though this may remain within normal limits. Colonoscopy would be helpful for further evaluation, when and as deemed clinically appropriate. 3. Scattered diverticulosis along the sigmoid colon, without evidence of diverticulitis. 4.  Mild right renal scarring noted. 5. Scattered aortic atherosclerosis. 6. Mild degenerative change along the lumbar spine. 7. Atelectasis or scarring noted at the lung bases. Electronically Signed   By: Garald Balding M.D.   On: 04/17/2017 18:46   Mr 3d Recon At Scanner  Result Date: 04/19/2017 CLINICAL DATA:  Painless jaundice, weight loss, cholelithiasis, abnormal CT EXAM: MRI ABDOMEN WITHOUT AND WITH CONTRAST (INCLUDING MRCP) TECHNIQUE: Multiplanar multisequence MR imaging of the abdomen was performed both before and after the administration of intravenous contrast. Heavily T2-weighted images of the biliary and pancreatic ducts were obtained, and three-dimensional MRCP images were rendered by post processing. CONTRAST:  21mL MULTIHANCE GADOBENATE DIMEGLUMINE 529 MG/ML IV SOLN COMPARISON:  Right upper quadrant ultrasound dated 04/18/2017. CT abdomen/pelvis dated 04/17/2017. FINDINGS: Severely motion degraded images. Lower chest: Lung bases are clear. Hepatobiliary: Liver is poorly evaluated due to motion degradation. Specifically, the dynamic postcontrast imaging is essentially nondiagnostic. However, no focal hepatic lesion is seen. Mild periportal edema predominantly along the left biliary tree (series 3/ image 21). This appearance raises concern for cholangitis. Hepatitis is considered less likely given the asymmetric appearance. Given concern for malignancy, is certainly would be difficult to exclude cholangiocarcinoma given the limitations of the current study. However, intrahepatic ductal dilatation seems less pronounced than periportal edema. Regardless, this remains within the differential. Mildly distended gallbladder with layering 1.7 cm gallstone. No gallbladder wall thickening. Mild pericholecystic fluid. This appearance is not convincing for acute cholecystitis on MRI, although this was more suspicious on prior CT. Common duct measures 5 mm, within normal limits. No choledocholithiasis is seen.  Pancreas:  Within normal limits. Spleen:  Within normal limits. Adrenals/Urinary Tract:  Adrenal glands are within normal limits. Kidneys are within normal limits.  No hydronephrosis. Stomach/Bowel: Stomach is  grossly unremarkable. Visualized bowel is unremarkable. Vascular/Lymphatic:  No evidence of abdominal aortic aneurysm. No suspicious abdominal lymphadenopathy. Other:  No abdominal ascites. Musculoskeletal: No focal osseous lesions. IMPRESSION: Severely motion degraded images, limiting evaluation. Specifically, the dynamic post-contrast imaging is essentially nondiagnostic. Cholelithiasis with mild pericholecystic fluid. This appearance is not convincing for acute cholecystitis on MRI, although this was more suspicious on prior CT. Common duct measures 5 mm, within normal limits. No choledocholithiasis is seen. Mild periportal edema, predominantly within the left hepatic lobe. To a lesser extent, mild intrahepatic ductal dilatation may be present, although this is equivocal. This overall appearance favors cholangitis. Given concern for malignancy, cholangiocarcinoma is difficult to exclude on the current limited evaluation. Electronically Signed   By: Julian Hy M.D.   On: 04/19/2017 14:43   Dg Chest Portable 1 View  Result Date: 04/27/2017 CLINICAL DATA:  Dyspnea. Biliary stent placement yesterday. Vomiting. EXAM: PORTABLE CHEST 1 VIEW COMPARISON:  Radiograph 10/11/2016 FINDINGS: Again seen hyperinflation. Linear scarring at the right lung base. Normal heart size and mediastinal contours with atherosclerosis of the thoracic aorta. Calcified granuloma in the left midlung. No consolidation, pleural fluid or pneumothorax. No acute osseous abnormality. IMPRESSION: Chronic hyperinflation suggesting COPD. Right lung base scarring. No acute abnormality. Thoracic aortic atherosclerosis. Electronically Signed   By: Jeb Levering M.D.   On: 04/27/2017 01:45   Dg C-arm 1-60 Min  Result Date:  04/25/2017 CLINICAL DATA:  Common bile duct stones. EXAM: DG C-ARM 61-120 MIN COMPARISON:  None. FINDINGS: Two images are submitted from the ERCP. A stricture is noted within the all common bile duct at the ampulla. A second image demonstrates CBD stent in place with decompression of the biliary system. No focal stones are present. IMPRESSION: 1. Distal CBD stricture. 2. Subsequent stenting of the common bile duct. Electronically Signed   By: San Morelle M.D.   On: 04/25/2017 15:01   Mr Abdomen Mrcp W Wo Contast  Result Date: 04/19/2017 CLINICAL DATA:  Painless jaundice, weight loss, cholelithiasis, abnormal CT EXAM: MRI ABDOMEN WITHOUT AND WITH CONTRAST (INCLUDING MRCP) TECHNIQUE: Multiplanar multisequence MR imaging of the abdomen was performed both before and after the administration of intravenous contrast. Heavily T2-weighted images of the biliary and pancreatic ducts were obtained, and three-dimensional MRCP images were rendered by post processing. CONTRAST:  36mL MULTIHANCE GADOBENATE DIMEGLUMINE 529 MG/ML IV SOLN COMPARISON:  Right upper quadrant ultrasound dated 04/18/2017. CT abdomen/pelvis dated 04/17/2017. FINDINGS: Severely motion degraded images. Lower chest: Lung bases are clear. Hepatobiliary: Liver is poorly evaluated due to motion degradation. Specifically, the dynamic postcontrast imaging is essentially nondiagnostic. However, no focal hepatic lesion is seen. Mild periportal edema predominantly along the left biliary tree (series 3/ image 21). This appearance raises concern for cholangitis. Hepatitis is considered less likely given the asymmetric appearance. Given concern for malignancy, is certainly would be difficult to exclude cholangiocarcinoma given the limitations of the current study. However, intrahepatic ductal dilatation seems less pronounced than periportal edema. Regardless, this remains within the differential. Mildly distended gallbladder with layering 1.7 cm gallstone.  No gallbladder wall thickening. Mild pericholecystic fluid. This appearance is not convincing for acute cholecystitis on MRI, although this was more suspicious on prior CT. Common duct measures 5 mm, within normal limits. No choledocholithiasis is seen. Pancreas:  Within normal limits. Spleen:  Within normal limits. Adrenals/Urinary Tract:  Adrenal glands are within normal limits. Kidneys are within normal limits.  No hydronephrosis. Stomach/Bowel: Stomach is grossly unremarkable. Visualized bowel is unremarkable. Vascular/Lymphatic:  No  evidence of abdominal aortic aneurysm. No suspicious abdominal lymphadenopathy. Other:  No abdominal ascites. Musculoskeletal: No focal osseous lesions. IMPRESSION: Severely motion degraded images, limiting evaluation. Specifically, the dynamic post-contrast imaging is essentially nondiagnostic. Cholelithiasis with mild pericholecystic fluid. This appearance is not convincing for acute cholecystitis on MRI, although this was more suspicious on prior CT. Common duct measures 5 mm, within normal limits. No choledocholithiasis is seen. Mild periportal edema, predominantly within the left hepatic lobe. To a lesser extent, mild intrahepatic ductal dilatation may be present, although this is equivocal. This overall appearance favors cholangitis. Given concern for malignancy, cholangiocarcinoma is difficult to exclude on the current limited evaluation. Electronically Signed   By: Julian Hy M.D.   On: 04/19/2017 14:43   US Abdomen Limited Ruq  Result Date: 04/18/2017 CLINICAL DATA:  Right upper quadrant abdominal pain. EXAM: US ABDOMEN LIMITED - RIGHT UPPER QUADRANT COMPARISON:  CT, 04/17/2017 FINDINGS: Gallbladder: 2 cm stone lies in the gallbladder neck. There is mild wall thickening of the lower aspect of the gallbladder, with the wall measuring 5 mm, associated with a small amount of adjacent pericholecystic fluid. Wall of the middle to upper gallbladder appears normal in  thickness. Common bile duct: Diameter: 2.8 mm Liver: Liver demonstrates somewhat echogenic portal triads consistent with the periportal hypoattenuation noted on CT. This can be seen in the setting of hepatic inflammation but is nonspecific. Liver is normal in overall size. No liver mass or focal lesion. IMPRESSION: 1. 2 cm gallstone with wall thickening of the lower aspect of the gallbladder and a small amount of associated pericholecystic fluid. In the proper clinical setting, early acute cholecystitis should be suspected. 2. No bile duct dilation. 3. Liver appearance is consistent with best seen on the previous day's CT scan. Echogenic portal triads suggests hepatic inflammation. Hepatic inflammation may be the source of the pericholecystic fluid and cause reactive wall thickening, as an alternative to acute cholecystitis. Electronically Signed   By: Lajean Manes M.D.   On: 04/18/2017 09:53    Assessment and Plan:   Margaret Stevenson is a 71 y.o. y/o female who has an elevated CA 19-9 with a increased alpha-fetoprotein.  The patient had an ERCP with a cholangiogram that did not show any cholangiocarcinoma.  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  The patient will be set up for a colonoscopy due to her history of polyps.  The patient will also be sent to [facility] for possible ampullectomy. We will also recheck her liver enzymes alpha-fetoprotein and CA 19-9.  Her CT scan did show some thickening in the distal transverse colon. I have discussed risks & benefits which include, but are not limited to, bleeding, infection, perforation & drug reaction.  The patient agrees with this plan & written consent will be obtained.       Lucilla Lame, MD. Marval Regal   Note: This dictation was prepared with Dragon dictation along with smaller phrase technology. Any transcriptional errors that result from this process are unintentional.

## 2017-05-09 ENCOUNTER — Other Ambulatory Visit: Payer: Self-pay

## 2017-05-09 LAB — CANCER ANTIGEN 19-9: CA 19 9: 50 U/mL — AB (ref 0–35)

## 2017-05-09 LAB — AFP TUMOR MARKER: AFP TUMOR MARKER: 9.4 ng/mL — AB (ref 0.0–8.3)

## 2017-05-09 NOTE — Progress Notes (Signed)
[facility]  Patient Status: Initially declined SN, only wanted PT. PT arrived at home patient declined PT at that time as well, notified Isaias Cowman, CM and Marshell Garfinkel, [place] 05/09/2017, 10:39 AM

## 2017-05-15 ENCOUNTER — Telehealth: Payer: Self-pay | Admitting: Gastroenterology

## 2017-05-15 NOTE — Telephone Encounter (Signed)
Patient left a voice message that she would be getting a call from [facility] in a couple of day regarding a ERCP but it has now been a couple of weeks. Please call

## 2017-05-16 NOTE — Telephone Encounter (Signed)
Pt advised of lab results. Advised her I will call [facility] to check status of her appt.   Contacted [facility] and was advised she should be receiving a call anytime.

## 2017-05-20 ENCOUNTER — Encounter: Payer: Self-pay | Admitting: *Deleted

## 2017-05-20 ENCOUNTER — Ambulatory Visit: Payer: PRIVATE HEALTH INSURANCE | Admitting: Gastroenterology

## 2017-05-23 NOTE — Discharge Instructions (Signed)
General Anesthesia, Adult, Care After °These instructions provide you with information about caring for yourself after your procedure. Your health care provider may also give you more specific instructions. Your treatment has been planned according to current medical practices, but problems sometimes occur. Call your health care provider if you have any problems or questions after your procedure. °What can I expect after the procedure? °After the procedure, it is common to have: °· Vomiting. °· A sore throat. °· Mental slowness. ° °It is common to feel: °· Nauseous. °· Cold or shivery. °· Sleepy. °· Tired. °· Sore or achy, even in parts of your body where you did not have surgery. ° °Follow these instructions at home: °For at least 24 hours after the procedure: °· Do not: °? Participate in activities where you could fall or become injured. °? Drive. °? Use heavy machinery. °? Drink alcohol. °? Take sleeping pills or medicines that cause drowsiness. °? Make important decisions or sign legal documents. °? Take care of children on your own. °· Rest. °Eating and drinking °· If you vomit, drink water, juice, or soup when you can drink without vomiting. °· Drink enough fluid to keep your urine clear or pale yellow. °· Make sure you have little or no nausea before eating solid foods. °· Follow the diet recommended by your health care provider. °General instructions °· Have a responsible adult stay with you until you are awake and alert. °· Return to your normal activities as told by your health care provider. Ask your health care provider what activities are safe for you. °· Take over-the-counter and prescription medicines only as told by your health care provider. °· If you smoke, do not smoke without supervision. °· Keep all follow-up visits as told by your health care provider. This is important. °Contact a health care provider if: °· You continue to have nausea or vomiting at home, and medicines are not helpful. °· You  cannot drink fluids or start eating again. °· You cannot urinate after 8-12 hours. °· You develop a skin rash. °· You have fever. °· You have increasing redness at the site of your procedure. °Get help right away if: °· You have difficulty breathing. °· You have chest pain. °· You have unexpected bleeding. °· You feel that you are having a life-threatening or urgent problem. °This information is not intended to replace advice given to you by your health care provider. Make sure you discuss any questions you have with your health care provider. °Document Released: 03/03/2001 Document Revised: 04/29/2016 Document Reviewed: 11/09/2015 °Elsevier Interactive Patient Education © 2018 [employer] ° °

## 2017-05-26 ENCOUNTER — Encounter
Admission: RE | Disposition: A | Discharge status: Home / Self Care | Payer: Self-pay | Source: Ambulatory Visit | Attending: Gastroenterology

## 2017-05-26 ENCOUNTER — Ambulatory Visit
Admission: RE | Admit: 2017-05-26 | Discharge: 2017-05-26 | Disposition: A | Discharge status: Home / Self Care | Payer: Medicare Other | Source: Ambulatory Visit | Attending: Gastroenterology | Admitting: Gastroenterology

## 2017-05-26 ENCOUNTER — Ambulatory Visit: Payer: Medicare Other | Admitting: Anesthesiology

## 2017-05-26 DIAGNOSIS — F17200 Nicotine dependence, unspecified, uncomplicated: Secondary | ICD-10-CM | POA: Insufficient documentation

## 2017-05-26 DIAGNOSIS — Z860100 Personal history of colon polyps, unspecified: Secondary | ICD-10-CM

## 2017-05-26 DIAGNOSIS — Z79899 Other long term (current) drug therapy: Secondary | ICD-10-CM | POA: Insufficient documentation

## 2017-05-26 DIAGNOSIS — D123 Benign neoplasm of transverse colon: Secondary | ICD-10-CM

## 2017-05-26 DIAGNOSIS — D120 Benign neoplasm of cecum: Secondary | ICD-10-CM

## 2017-05-26 DIAGNOSIS — Z7951 Long term (current) use of inhaled steroids: Secondary | ICD-10-CM | POA: Insufficient documentation

## 2017-05-26 DIAGNOSIS — Z9981 Dependence on supplemental oxygen: Secondary | ICD-10-CM | POA: Insufficient documentation

## 2017-05-26 DIAGNOSIS — J449 Chronic obstructive pulmonary disease, unspecified: Secondary | ICD-10-CM | POA: Insufficient documentation

## 2017-05-26 DIAGNOSIS — Z1211 Encounter for screening for malignant neoplasm of colon: Secondary | ICD-10-CM | POA: Insufficient documentation

## 2017-05-26 DIAGNOSIS — D122 Benign neoplasm of ascending colon: Secondary | ICD-10-CM

## 2017-05-26 DIAGNOSIS — Z880 Allergy status to penicillin: Secondary | ICD-10-CM | POA: Insufficient documentation

## 2017-05-26 HISTORY — PX: COLONOSCOPY WITH PROPOFOL: SHX5780

## 2017-05-26 HISTORY — DX: Unspecified osteoarthritis, unspecified site: M19.90

## 2017-05-26 HISTORY — PX: POLYPECTOMY: SHX5525

## 2017-05-26 HISTORY — DX: Dyspnea, unspecified: R06.00

## 2017-05-26 SURGERY — COLONOSCOPY WITH PROPOFOL
Anesthesia: General | Wound class: Contaminated

## 2017-05-26 MED ORDER — LACTATED RINGERS IV SOLN
INTRAVENOUS | Status: DC
  Administered 2017-05-26: 11:00:00 via INTRAVENOUS

## 2017-05-26 MED ORDER — STERILE WATER FOR IRRIGATION IR SOLN
Status: DC | PRN
  Administered 2017-05-26: 12:00:00

## 2017-05-26 MED ORDER — SODIUM CHLORIDE 0.9 % IV SOLN: INTRAVENOUS | Status: DC

## 2017-05-26 MED ORDER — LIDOCAINE HCL (CARDIAC) 20 MG/ML IV SOLN
INTRAVENOUS | Status: DC | PRN
  Administered 2017-05-26: 50 mg via INTRAVENOUS

## 2017-05-26 MED ORDER — PROPOFOL 10 MG/ML IV BOLUS
INTRAVENOUS | Status: DC | PRN
  Administered 2017-05-26 (×3): 50 mg via INTRAVENOUS
  Administered 2017-05-26: 100 mg via INTRAVENOUS

## 2017-05-26 SURGICAL SUPPLY — 23 items
CANISTER SUCT 1200ML W/VALVE (MISCELLANEOUS) ×4 IMPLANT
CLIP HMST 235XBRD CATH ROT (MISCELLANEOUS) IMPLANT
CLIP RESOLUTION 360 11X235 (MISCELLANEOUS)
FCP ESCP3.2XJMB 240X2.8X (MISCELLANEOUS)
FORCEPS BIOP RAD 4 LRG CAP 4 (CUTTING FORCEPS) IMPLANT
FORCEPS BIOP RJ4 240 W/NDL (MISCELLANEOUS)
FORCEPS ESCP3.2XJMB 240X2.8X (MISCELLANEOUS) IMPLANT
GOWN CVR UNV OPN BCK APRN NK (MISCELLANEOUS) ×4 IMPLANT
GOWN ISOL THUMB LOOP REG UNIV (MISCELLANEOUS) ×4
INJECTOR VARIJECT VIN23 (MISCELLANEOUS) IMPLANT
KIT DEFENDO VALVE AND CONN (KITS) IMPLANT
KIT ENDO PROCEDURE OLY (KITS) ×4 IMPLANT
MARKER SPOT ENDO TATTOO 5ML (MISCELLANEOUS) IMPLANT
PAD GROUND ADULT SPLIT (MISCELLANEOUS) IMPLANT
PROBE APC STR FIRE (PROBE) IMPLANT
RETRIEVER NET ROTH 2.5X230 LF (MISCELLANEOUS) IMPLANT
SNARE SHORT THROW 13M SML OVAL (MISCELLANEOUS) ×4 IMPLANT
SNARE SHORT THROW 30M LRG OVAL (MISCELLANEOUS) IMPLANT
SNARE SNG USE RND 15MM (INSTRUMENTS) IMPLANT
SPOT EX ENDOSCOPIC TATTOO (MISCELLANEOUS)
TRAP ETRAP POLY (MISCELLANEOUS) ×4 IMPLANT
VARIJECT INJECTOR VIN23 (MISCELLANEOUS)
WATER STERILE IRR 250ML POUR (IV SOLUTION) ×4 IMPLANT

## 2017-05-26 NOTE — Interval H&P Note (Signed)
History and Physical Interval Note:  05/26/2017 10:40 AM  Margaret Stevenson  has presented today for surgery, with the diagnosis of Hx of colon polyps Z86.010  The various methods of treatment have been discussed with the patient and family. After consideration of risks, benefits and other options for treatment, the patient has consented to  Procedure(s) with comments: COLONOSCOPY WITH PROPOFOL (N/A) - please do not move appt time as a surgical intervention .  The patient's history has been reviewed, patient examined, no change in status, stable for surgery.  I have reviewed the patient's chart and labs.  Questions were answered to the patient's satisfaction.     [name]

## 2017-05-26 NOTE — Op Note (Signed)
[facility] Gastroenterology Patient Name: Margaret Stevenson Procedure Date: 05/26/2017 11:24 AM MRN: [id number] Account #: [id number] Date of Birth: 02/24/1946 Admit Type: Outpatient Age: 71 Room: [facility] OR ROOM 01 Gender: Female Note Status: Finalized Procedure:            Colonoscopy Indications:          High risk colon cancer surveillance: Personal history                        of colonic polyps Providers:            Lucilla Lame MD, MD Referring MD:         Dion Body (Referring MD) Medicines:            Propofol per Anesthesia Complications:        No immediate complications. Procedure:            Pre-Anesthesia Assessment:                       - Prior to the procedure, a History and Physical was                        performed, and patient medications and allergies were                        reviewed. The patient's tolerance of previous                        anesthesia was also reviewed. The risks and benefits of                        the procedure and the sedation options and risks were                        discussed with the patient. All questions were                        answered, and informed consent was obtained. Prior                        Anticoagulants: The patient has taken no previous                        anticoagulant or antiplatelet agents. ASA Grade                        Assessment: II - A patient with mild systemic disease.                        After reviewing the risks and benefits, the patient was                        deemed in satisfactory condition to undergo the                        procedure.                       After obtaining informed consent, the colonoscope was  passed under direct vision. Throughout the procedure,                        the patient's blood pressure, pulse, and oxygen                        saturations were monitored continuously. The [place] [id number]) was introduced through the                        anus and advanced to the the cecum, identified by                        appendiceal orifice and ileocecal valve. The                        colonoscopy was performed without difficulty. The                        patient tolerated the procedure well. The quality of                        the bowel preparation was excellent. Findings:      The perianal and digital rectal examinations were normal.      A 4 mm polyp was found in the cecum. The polyp was sessile. The polyp       was removed with a cold snare. Resection and retrieval were complete.      Two sessile polyps were found in the ascending colon. The polyps were 3       to 4 mm in size. These polyps were removed with a cold snare. Resection       and retrieval were complete.      Two sessile polyps were found in the transverse colon. The polyps were 4       to 5 mm in size. These polyps were removed with a cold snare. Resection       and retrieval were complete. Impression:           - One 4 mm polyp in the cecum, removed with a cold                        snare. Resected and retrieved.                       - Two 3 to 4 mm polyps in the ascending colon, removed                        with a cold snare. Resected and retrieved.                       - Two 4 to 5 mm polyps in the transverse colon, removed                        with a cold snare. Resected and retrieved. Recommendation:       - Discharge patient to home.                       - Resume previous diet.                       -  Continue present medications.                       - Repeat colonoscopy in 5 years for surveillance. Procedure Code(s):    --- Professional ---                       [phone], Colonoscopy, flexible; with removal of tumor(s),                        polyp(s), or other lesion(s) by snare technique Diagnosis Code(s):    --- Professional ---                       Z86.010,  Personal history of colonic polyps                       D12.0, Benign neoplasm of cecum                       D12.2, Benign neoplasm of ascending colon                       D12.3, Benign neoplasm of transverse colon (hepatic                        flexure or splenic flexure) CPT copyright 2016 [employer]. All rights reserved. The codes documented in this report are preliminary and upon coder review may  be revised to meet current compliance requirements. Lucilla Lame MD, MD 05/26/2017 11:51:21 AM This report has been signed electronically. Number of Addenda: 0 Note Initiated On: 05/26/2017 11:24 AM Scope Withdrawal Time: 0 hours 9 minutes 10 seconds  Total Procedure Duration: 0 hours 16 minutes 33 seconds       [facility]

## 2017-05-26 NOTE — Anesthesia Postprocedure Evaluation (Signed)
Anesthesia Post Note  Patient: Margaret Stevenson  Procedure(s) Performed: Procedure(s) (LRB): COLONOSCOPY WITH PROPOFOL (N/A) POLYPECTOMY  Patient location during evaluation: PACU Anesthesia Type: General Level of consciousness: awake and alert Pain management: pain level controlled Vital Signs Assessment: post-procedure vital signs reviewed and stable Respiratory status: spontaneous breathing Cardiovascular status: blood pressure returned to baseline Postop Assessment: no headache Anesthetic complications: no    Jaci Standard, III,  Marvell Stavola D

## 2017-05-26 NOTE — H&P (View-Only) (Signed)
Primary Care Physician: Dion Body, MD  Primary Gastroenterologist:  Dr. Lucilla Lame  Chief Complaint  Patient presents with  . Jaundice  . elevated ca    HPI: Margaret Stevenson is a 71 y.o. female here for follow-up after an ERCP.  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 The patient reports that she has been told by her friends that her jaundice has decreased.  The patient was found to have an elevated alpha-fetoprotein and CA 19-9.  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  The patient also reports that she has a history of polyps and is due for repeat colonoscopy.  Current Outpatient Prescriptions  Medication Sig Dispense Refill  . albuterol (VENTOLIN HFA) 108 (90 Base) MCG/ACT inhaler Inhale 2 puffs into the lungs every 4 (four) hours as needed for shortness of breath.     . diphenoxylate-atropine (LOMOTIL) 2.5-0.025 MG tablet Take 1 tablet by mouth 4 (four) times daily as needed for diarrhea or loose stools. 60 tablet 3  . Fluticasone-Salmeterol (ADVAIR) 250-50 MCG/DOSE AEPB Inhale 1 puff into the lungs 2 (two) times daily.    Marland Kitchen tiotropium (SPIRIVA) 18 MCG inhalation capsule Place 18 mcg into inhaler and inhale at bedtime.      No current facility-administered medications for this visit.     Allergies as of 05/08/2017 - Review Complete 05/08/2017  Allergen Reaction Noted  . Penicillins Hives 08/02/2016    ROS:  General: Negative for anorexia, weight loss, fever, chills, fatigue, weakness. ENT: Negative for hoarseness, difficulty swallowing , nasal congestion. CV: Negative for chest pain, angina, palpitations, dyspnea on exertion, peripheral edema.  Respiratory: Negative for dyspnea at rest, dyspnea on exertion, cough, sputum, wheezing.  GI: See history of present illness. GU:  Negative for dysuria, hematuria, urinary incontinence, urinary frequency, nocturnal urination.  Endo: Negative for  unusual weight change.    Physical Examination:   BP 120/67   Pulse 98   Temp 99.1 F (37.3 C) (Oral)   Ht 5\' 7"  (1.702 m)   Wt 137 lb (62.1 kg)   BMI 21.46 kg/m   General: Well-nourished, well-developed in no acute distress.  Eyes: No icterus. Conjunctivae pink. Extremities: No lower extremity edema. No clubbing or deformities. Neuro: Alert and oriented x 3.  Grossly intact. Skin: Warm and dry, no jaundice.   Psych: Alert and cooperative, normal mood and affect.  Labs:    Imaging Studies: Ct Abdomen Pelvis W Contrast  Result Date: 04/17/2017 CLINICAL DATA:  Acute onset of jaundice and lack of appetite. Vomiting. Initial encounter. EXAM: CT ABDOMEN AND PELVIS WITH CONTRAST TECHNIQUE: Multidetector CT imaging of the abdomen and pelvis was performed using the standard protocol following bolus administration of intravenous contrast. CONTRAST:  144mL ISOVUE-300 IOPAMIDOL (ISOVUE-300) INJECTION 61% COMPARISON:  None. FINDINGS: Lower chest: Bibasilar atelectasis or scarring is noted. The visualized portions of the mediastinum are unremarkable. Hepatobiliary: The liver is grossly unremarkable in appearance. There is diffuse soft tissue inflammation about the porta hepatis, with pericholecystic fluid and mild gallbladder wall edema, concerning for acute cholecystitis. The gallbladder is somewhat distended. There appear to be stones lodged at the neck of the gallbladder. The common bile duct appears normal in caliber. A mildly prominent 1.2 cm node is noted anterior to the infrahepatic IVC. Pancreas: The pancreas is within normal limits. Spleen: The spleen is unremarkable in appearance. Adrenals/Urinary Tract: The adrenal glands are unremarkable in appearance. Mild right renal  scarring is noted. The kidneys are otherwise unremarkable. There is no evidence of hydronephrosis. No renal or ureteral stones are identified. No significant perinephric stranding is appreciated. Stomach/Bowel: The stomach is  unremarkable in appearance. The small bowel is within normal limits. The appendix is normal in caliber, without evidence of appendicitis. Vague segmental wall thickening is noted at the distal transverse colon. An underlying mass cannot be entirely excluded. Scattered diverticulosis is noted along the sigmoid colon, without evidence of diverticulitis. Vascular/Lymphatic: Scattered calcification is seen along the abdominal aorta and its branches. The abdominal aorta is otherwise grossly unremarkable. The inferior vena cava is grossly unremarkable. No retroperitoneal lymphadenopathy is seen. No pelvic sidewall lymphadenopathy is identified. Reproductive: The bladder is mildly distended and within normal limits. The uterus is grossly unremarkable in appearance. The ovaries are relatively symmetric. No suspicious adnexal masses are seen. Other: No additional soft tissue abnormalities are seen. Musculoskeletal: No acute osseous abnormalities are identified. Multilevel vacuum phenomenon is noted along the lumbar spine, with underlying endplate sclerotic change. The visualized musculature is unremarkable in appearance. IMPRESSION: 1. Diffuse soft tissue inflammation about the porta hepatis, likely reflecting cholangitis, with mild gallbladder wall edema and pericholecystic fluid, concerning for acute cholecystitis. Gallbladder somewhat distended in appearance. Apparent stones lodged at the neck of the gallbladder. Mildly prominent adjacent node noted. Underlying mass is considered unlikely, but cannot be entirely excluded. Would correlate with the chronicity of the patient's symptoms. 2. Vague segmental wall thickening at the distal transverse colon. Underlying mass cannot be entirely excluded, though this may remain within normal limits. Colonoscopy would be helpful for further evaluation, when and as deemed clinically appropriate. 3. Scattered diverticulosis along the sigmoid colon, without evidence of diverticulitis. 4.  Mild right renal scarring noted. 5. Scattered aortic atherosclerosis. 6. Mild degenerative change along the lumbar spine. 7. Atelectasis or scarring noted at the lung bases. Electronically Signed   By: Garald Balding M.D.   On: 04/17/2017 18:46   Mr 3d Recon At Scanner  Result Date: 04/19/2017 CLINICAL DATA:  Painless jaundice, weight loss, cholelithiasis, abnormal CT EXAM: MRI ABDOMEN WITHOUT AND WITH CONTRAST (INCLUDING MRCP) TECHNIQUE: Multiplanar multisequence MR imaging of the abdomen was performed both before and after the administration of intravenous contrast. Heavily T2-weighted images of the biliary and pancreatic ducts were obtained, and three-dimensional MRCP images were rendered by post processing. CONTRAST:  48mL MULTIHANCE GADOBENATE DIMEGLUMINE 529 MG/ML IV SOLN COMPARISON:  Right upper quadrant ultrasound dated 04/18/2017. CT abdomen/pelvis dated 04/17/2017. FINDINGS: Severely motion degraded images. Lower chest: Lung bases are clear. Hepatobiliary: Liver is poorly evaluated due to motion degradation. Specifically, the dynamic postcontrast imaging is essentially nondiagnostic. However, no focal hepatic lesion is seen. Mild periportal edema predominantly along the left biliary tree (series 3/ image 21). This appearance raises concern for cholangitis. Hepatitis is considered less likely given the asymmetric appearance. Given concern for malignancy, is certainly would be difficult to exclude cholangiocarcinoma given the limitations of the current study. However, intrahepatic ductal dilatation seems less pronounced than periportal edema. Regardless, this remains within the differential. Mildly distended gallbladder with layering 1.7 cm gallstone. No gallbladder wall thickening. Mild pericholecystic fluid. This appearance is not convincing for acute cholecystitis on MRI, although this was more suspicious on prior CT. Common duct measures 5 mm, within normal limits. No choledocholithiasis is seen.  Pancreas:  Within normal limits. Spleen:  Within normal limits. Adrenals/Urinary Tract:  Adrenal glands are within normal limits. Kidneys are within normal limits.  No hydronephrosis. Stomach/Bowel: Stomach is  grossly unremarkable. Visualized bowel is unremarkable. Vascular/Lymphatic:  No evidence of abdominal aortic aneurysm. No suspicious abdominal lymphadenopathy. Other:  No abdominal ascites. Musculoskeletal: No focal osseous lesions. IMPRESSION: Severely motion degraded images, limiting evaluation. Specifically, the dynamic post-contrast imaging is essentially nondiagnostic. Cholelithiasis with mild pericholecystic fluid. This appearance is not convincing for acute cholecystitis on MRI, although this was more suspicious on prior CT. Common duct measures 5 mm, within normal limits. No choledocholithiasis is seen. Mild periportal edema, predominantly within the left hepatic lobe. To a lesser extent, mild intrahepatic ductal dilatation may be present, although this is equivocal. This overall appearance favors cholangitis. Given concern for malignancy, cholangiocarcinoma is difficult to exclude on the current limited evaluation. Electronically Signed   By: Julian Hy M.D.   On: 04/19/2017 14:43   Dg Chest Portable 1 View  Result Date: 04/27/2017 CLINICAL DATA:  Dyspnea. Biliary stent placement yesterday. Vomiting. EXAM: PORTABLE CHEST 1 VIEW COMPARISON:  Radiograph 10/11/2016 FINDINGS: Again seen hyperinflation. Linear scarring at the right lung base. Normal heart size and mediastinal contours with atherosclerosis of the thoracic aorta. Calcified granuloma in the left midlung. No consolidation, pleural fluid or pneumothorax. No acute osseous abnormality. IMPRESSION: Chronic hyperinflation suggesting COPD. Right lung base scarring. No acute abnormality. Thoracic aortic atherosclerosis. Electronically Signed   By: Jeb Levering M.D.   On: 04/27/2017 01:45   Dg C-arm 1-60 Min  Result Date:  04/25/2017 CLINICAL DATA:  Common bile duct stones. EXAM: DG C-ARM 61-120 MIN COMPARISON:  None. FINDINGS: Two images are submitted from the ERCP. A stricture is noted within the all common bile duct at the ampulla. A second image demonstrates CBD stent in place with decompression of the biliary system. No focal stones are present. IMPRESSION: 1. Distal CBD stricture. 2. Subsequent stenting of the common bile duct. Electronically Signed   By: San Morelle M.D.   On: 04/25/2017 15:01   Mr Abdomen Mrcp W Wo Contast  Result Date: 04/19/2017 CLINICAL DATA:  Painless jaundice, weight loss, cholelithiasis, abnormal CT EXAM: MRI ABDOMEN WITHOUT AND WITH CONTRAST (INCLUDING MRCP) TECHNIQUE: Multiplanar multisequence MR imaging of the abdomen was performed both before and after the administration of intravenous contrast. Heavily T2-weighted images of the biliary and pancreatic ducts were obtained, and three-dimensional MRCP images were rendered by post processing. CONTRAST:  69mL MULTIHANCE GADOBENATE DIMEGLUMINE 529 MG/ML IV SOLN COMPARISON:  Right upper quadrant ultrasound dated 04/18/2017. CT abdomen/pelvis dated 04/17/2017. FINDINGS: Severely motion degraded images. Lower chest: Lung bases are clear. Hepatobiliary: Liver is poorly evaluated due to motion degradation. Specifically, the dynamic postcontrast imaging is essentially nondiagnostic. However, no focal hepatic lesion is seen. Mild periportal edema predominantly along the left biliary tree (series 3/ image 21). This appearance raises concern for cholangitis. Hepatitis is considered less likely given the asymmetric appearance. Given concern for malignancy, is certainly would be difficult to exclude cholangiocarcinoma given the limitations of the current study. However, intrahepatic ductal dilatation seems less pronounced than periportal edema. Regardless, this remains within the differential. Mildly distended gallbladder with layering 1.7 cm gallstone.  No gallbladder wall thickening. Mild pericholecystic fluid. This appearance is not convincing for acute cholecystitis on MRI, although this was more suspicious on prior CT. Common duct measures 5 mm, within normal limits. No choledocholithiasis is seen. Pancreas:  Within normal limits. Spleen:  Within normal limits. Adrenals/Urinary Tract:  Adrenal glands are within normal limits. Kidneys are within normal limits.  No hydronephrosis. Stomach/Bowel: Stomach is grossly unremarkable. Visualized bowel is unremarkable. Vascular/Lymphatic:  No  evidence of abdominal aortic aneurysm. No suspicious abdominal lymphadenopathy. Other:  No abdominal ascites. Musculoskeletal: No focal osseous lesions. IMPRESSION: Severely motion degraded images, limiting evaluation. Specifically, the dynamic post-contrast imaging is essentially nondiagnostic. Cholelithiasis with mild pericholecystic fluid. This appearance is not convincing for acute cholecystitis on MRI, although this was more suspicious on prior CT. Common duct measures 5 mm, within normal limits. No choledocholithiasis is seen. Mild periportal edema, predominantly within the left hepatic lobe. To a lesser extent, mild intrahepatic ductal dilatation may be present, although this is equivocal. This overall appearance favors cholangitis. Given concern for malignancy, cholangiocarcinoma is difficult to exclude on the current limited evaluation. Electronically Signed   By: Julian Hy M.D.   On: 04/19/2017 14:43   US Abdomen Limited Ruq  Result Date: 04/18/2017 CLINICAL DATA:  Right upper quadrant abdominal pain. EXAM: US ABDOMEN LIMITED - RIGHT UPPER QUADRANT COMPARISON:  CT, 04/17/2017 FINDINGS: Gallbladder: 2 cm stone lies in the gallbladder neck. There is mild wall thickening of the lower aspect of the gallbladder, with the wall measuring 5 mm, associated with a small amount of adjacent pericholecystic fluid. Wall of the middle to upper gallbladder appears normal in  thickness. Common bile duct: Diameter: 2.8 mm Liver: Liver demonstrates somewhat echogenic portal triads consistent with the periportal hypoattenuation noted on CT. This can be seen in the setting of hepatic inflammation but is nonspecific. Liver is normal in overall size. No liver mass or focal lesion. IMPRESSION: 1. 2 cm gallstone with wall thickening of the lower aspect of the gallbladder and a small amount of associated pericholecystic fluid. In the proper clinical setting, early acute cholecystitis should be suspected. 2. No bile duct dilation. 3. Liver appearance is consistent with best seen on the previous day's CT scan. Echogenic portal triads suggests hepatic inflammation. Hepatic inflammation may be the source of the pericholecystic fluid and cause reactive wall thickening, as an alternative to acute cholecystitis. Electronically Signed   By: Lajean Manes M.D.   On: 04/18/2017 09:53    Assessment and Plan:   Ethelene Closser is a 71 y.o. y/o female who has an elevated CA 19-9 with a increased alpha-fetoprotein.  The patient had an ERCP with a cholangiogram that did not show any cholangiocarcinoma.  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  The patient will be set up for a colonoscopy due to her history of polyps.  The patient will also be sent to [facility] for possible ampullectomy. We will also recheck her liver enzymes alpha-fetoprotein and CA 19-9.  Her CT scan did show some thickening in the distal transverse colon. I have discussed risks & benefits which include, but are not limited to, bleeding, infection, perforation & drug reaction.  The patient agrees with this plan & written consent will be obtained.       Lucilla Lame, MD. Marval Regal   Note: This dictation was prepared with Dragon dictation along with smaller phrase technology. Any transcriptional errors that result from this process are unintentional.

## 2017-05-26 NOTE — Anesthesia Preprocedure Evaluation (Addendum)
Anesthesia Evaluation  Patient identified by MRN, date of birth, ID band Patient awake    Reviewed: Allergy & Precautions, H&P , NPO status , Patient's Chart, lab work & pertinent test results  Airway Mallampati: II  TM Distance: >3 FB Neck ROM: full    Dental no notable dental hx.    Pulmonary shortness of breath, COPD,  oxygen dependent, Current Smoker,    Pulmonary exam normal        Cardiovascular Normal cardiovascular exam     Neuro/Psych    GI/Hepatic negative GI ROS, Neg liver ROS,   Endo/Other  negative endocrine ROS  Renal/GU negative Renal ROS     Musculoskeletal   Abdominal   Peds  Hematology negative hematology ROS (+)   Anesthesia Other Findings   Reproductive/Obstetrics                            Anesthesia Physical Anesthesia Plan  ASA: III  Anesthesia Plan: General   Post-op Pain Management:    Induction:   PONV Risk Score and Plan:   Airway Management Planned:   Additional Equipment:   Intra-op Plan:   Post-operative Plan:   Informed Consent: I have reviewed the patients History and Physical, chart, labs and discussed the procedure including the risks, benefits and alternatives for the proposed anesthesia with the patient or authorized representative who has indicated his/her understanding and acceptance.     Plan Discussed with:   Anesthesia Plan Comments:        Anesthesia Quick Evaluation

## 2017-05-26 NOTE — Anesthesia Procedure Notes (Signed)
Performed by: Bessye Stith Pre-anesthesia Checklist: Patient identified, Emergency Drugs available, Suction available, Timeout performed and Patient being monitored Patient Re-evaluated:Patient Re-evaluated prior to induction Oxygen Delivery Method: Nasal cannula Placement Confirmation: positive ETCO2       

## 2017-05-26 NOTE — Transfer of Care (Signed)
Immediate Anesthesia Transfer of Care Note  Patient: Margaret Stevenson  Procedure(s) Performed: Procedure(s) with comments: COLONOSCOPY WITH PROPOFOL (N/A) - please do not move appt time POLYPECTOMY  Patient Location: PACU  Anesthesia Type: General  Level of Consciousness: awake, alert  and patient cooperative  Airway and Oxygen Therapy: Patient Spontanous Breathing and Patient connected to supplemental oxygen  Post-op Assessment: Post-op Vital signs reviewed, Patient's Cardiovascular Status Stable, Respiratory Function Stable, Patent Airway and No signs of Nausea or vomiting  Post-op Vital Signs: Reviewed and stable  Complications: No apparent anesthesia complications

## 2017-05-27 ENCOUNTER — Encounter: Payer: Self-pay | Admitting: Gastroenterology

## 2017-05-28 ENCOUNTER — Encounter: Payer: Self-pay | Admitting: Gastroenterology

## 2017-05-29 ENCOUNTER — Encounter: Payer: Self-pay | Admitting: Gastroenterology

## 2017-06-19 ENCOUNTER — Telehealth: Payer: Self-pay | Admitting: Gastroenterology

## 2017-06-19 NOTE — Telephone Encounter (Signed)
Please call patient today. She had blood work done at [facility] AST 148 ALT 192 She is concerned about liver damage.

## 2017-06-19 NOTE — Telephone Encounter (Signed)
Spoke with pt and advised her the liver enzymes have actually come down since May. Bilirubin is normal. She is scheduled for an ERCP with ampullectomy at [facility] on 06/30/17 with Dr. Gayleen Orem.

## 2017-06-23 ENCOUNTER — Other Ambulatory Visit: Payer: Self-pay | Admitting: Family Medicine

## 2017-06-23 DIAGNOSIS — Z1231 Encounter for screening mammogram for malignant neoplasm of breast: Secondary | ICD-10-CM

## 2017-06-30 ENCOUNTER — Emergency Department
Admission: EM | Admit: 2017-06-30 | Discharge: 2017-07-01 | Disposition: A | Discharge status: Other Acute Inpatient Hospital | Payer: Medicare Other | Attending: Emergency Medicine | Admitting: Emergency Medicine

## 2017-06-30 DIAGNOSIS — J449 Chronic obstructive pulmonary disease, unspecified: Secondary | ICD-10-CM | POA: Diagnosis not present

## 2017-06-30 DIAGNOSIS — K625 Hemorrhage of anus and rectum: Secondary | ICD-10-CM | POA: Insufficient documentation

## 2017-06-30 DIAGNOSIS — Z79899 Other long term (current) drug therapy: Secondary | ICD-10-CM | POA: Insufficient documentation

## 2017-06-30 DIAGNOSIS — F17210 Nicotine dependence, cigarettes, uncomplicated: Secondary | ICD-10-CM | POA: Insufficient documentation

## 2017-06-30 LAB — COMPREHENSIVE METABOLIC PANEL
ALBUMIN: 3 g/dL — AB (ref 3.5–5.0)
ALK PHOS: 65 U/L (ref 38–126)
ALT: 150 U/L — ABNORMAL HIGH (ref 14–54)
ANION GAP: 9 (ref 5–15)
AST: 107 U/L — ABNORMAL HIGH (ref 15–41)
BILIRUBIN TOTAL: 1.1 mg/dL (ref 0.3–1.2)
BUN: 16 mg/dL (ref 6–20)
CALCIUM: 8.7 mg/dL — AB (ref 8.9–10.3)
CO2: 26 mmol/L (ref 22–32)
Chloride: 100 mmol/L — ABNORMAL LOW (ref 101–111)
Creatinine, Ser: 0.6 mg/dL (ref 0.44–1.00)
GFR calc Af Amer: >60 mL/min (ref >60)
GFR calc non Af Amer: >60 mL/min (ref >60)
Glucose, Bld: 179 mg/dL — ABNORMAL HIGH (ref 65–99)
Potassium: 5.1 mmol/L (ref 3.5–5.1)
SODIUM: 135 mmol/L (ref 135–145)
TOTAL PROTEIN: 5.5 g/dL — AB (ref 6.5–8.1)

## 2017-06-30 LAB — TYPE AND SCREEN
ABO/RH(D): A POS
Antibody Screen: NEGATIVE

## 2017-06-30 LAB — CBC
HEMATOCRIT: 34.3 % — AB (ref 35.0–47.0)
HEMOGLOBIN: 11.1 g/dL — AB (ref 12.0–16.0)
MCH: 31.3 pg (ref 26.0–34.0)
MCHC: 32.5 g/dL (ref 32.0–36.0)
MCV: 96.5 fL (ref 80.0–100.0)
Platelets: 232 10*3/uL (ref 150–440)
RBC: 3.55 MIL/uL — ABNORMAL LOW (ref 3.80–5.20)
RDW: 14.7 % — ABNORMAL HIGH (ref 11.5–14.5)
WBC: 16.1 10*3/uL — ABNORMAL HIGH (ref 3.6–11.0)

## 2017-06-30 MED ORDER — SODIUM CHLORIDE 0.9 % IV SOLN
Freq: Once | INTRAVENOUS | Status: AC
  Administered 2017-06-30: 23:00:00 via INTRAVENOUS

## 2017-06-30 MED ORDER — SODIUM CHLORIDE 0.9 % IV BOLUS (SEPSIS)
1000.0000 mL | Freq: Once | INTRAVENOUS | Status: AC
  Administered 2017-06-30: 1000 mL via INTRAVENOUS

## 2017-06-30 NOTE — ED Provider Notes (Signed)
[facility] Emergency Department Provider Note   ____________________________________________   I have reviewed the triage vital signs and the nursing notes.   HISTORY  Chief Complaint Rectal Bleeding   History limited by: Not Limited   HPI Margaret Stevenson is a 71 y.o. female who presents to the emergency department today via EMS because of concerns for rectal bleeding. The patient states it started just prior to calling EMS. It was bright red blood. The patient had a procedure done earlier today with a biliary stent replacement and polyp removal per the patient. She was told that she might have some bleeding however feels that the amount of bleeding she has had has been more than she would expect. Patient denies any associated abdominal pain.   Past Medical History:  Diagnosis Date  . Arthritis    hands  . Cancer ([place])    melanoma 07/2013  . COPD (chronic obstructive pulmonary disease) ([place])   . Dyspnea   . Jaundice 04/2017    Patient Active Problem List   Diagnosis Date Noted  . Hx of colonic polyps   . Benign neoplasm of cecum   . Benign neoplasm of ascending colon   . Benign neoplasm of transverse colon   . Cancer ([place]) 05/08/2017  . 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 Elevated CA 19-9 level 05/08/2017  . Arthritis 05/07/2017  . Asthma without status asthmaticus 05/07/2017  . Borderline diabetes 05/07/2017  . Hx of adenomatous colonic polyps 05/07/2017  . Tobacco dependence 05/07/2017  . Sepsis ([place]) 04/27/2017  . Disease of biliary tract   . Obstruction of bile duct   . Jaundice   . Hypoxia 10/11/2016  . Anxiety 10/11/2016  . Acute respiratory failure ([place]) 10/06/2016  . COPD (chronic obstructive pulmonary disease) ([place]) 10/06/2016  . CAP (community acquired pneumonia) 10/06/2016  . Hyperglycemia 10/06/2016  . Vaccine counseling 06/21/2016  . History of malignant melanoma of skin 08/03/2013    Past Surgical History:   Procedure Laterality Date  . BUNIONECTOMY    . COLONOSCOPY    . COLONOSCOPY WITH PROPOFOL N/A 05/26/2017   Procedure: COLONOSCOPY WITH PROPOFOL;  Surgeon: Lucilla Lame, MD;  Location: [place];  Service: Endoscopy;  Laterality: N/A;  please do not move appt time  . ERCP N/A 04/25/2017   Procedure: ENDOSCOPIC RETROGRADE CHOLANGIOPANCREATOGRAPHY (ERCP);  Surgeon: Lucilla Lame, MD;  Location[facility] ENDOSCOPY;  Service: Endoscopy;  Laterality: N/A;  . POLYPECTOMY  05/26/2017   Procedure: POLYPECTOMY;  Surgeon: Lucilla Lame, MD;  Location: [place];  Service: Endoscopy;;  . TONSILLECTOMY      Prior to Admission medications   Medication Sig Start Date End Date Taking? Authorizing Provider  albuterol (VENTOLIN HFA) 108 (90 Base) MCG/ACT inhaler Inhale 2 puffs into the lungs every 4 (four) hours as needed for shortness of breath.  08/27/16 08/27/17  [provider]  Fluticasone-Salmeterol (ADVAIR) 250-50 MCG/DOSE AEPB Inhale 1 puff into the lungs 2 (two) times daily.    [provider]  Na Sulfate-K Sulfate-Mg Sulf (SUPREP BOWEL PREP KIT) 17.5-3.13-1.6 GM/180ML SOLN Take 1 kit by mouth as directed. 05/08/17   Lucilla Lame, MD  OXYGEN Inhale 3 L into the lungs. 1 L when not active    [provider]  tiotropium (SPIRIVA) 18 MCG inhalation capsule Place 18 mcg into inhaler and inhale at bedtime.  08/27/16 08/27/17  [provider]  TURMERIC PO Take by mouth.    [provider]    Allergies Penicillins  Family History  Problem Relation Age of Onset  . Breast cancer Mother 94    Social History Social History  Substance Use Topics  . Smoking status: Current Every Day Smoker    Packs/day: 1.00    Years: 57.00    Types: Cigarettes  . Smokeless tobacco: Never Used  . Alcohol use 4.2 oz/week    7 Glasses of wine per week    Review of Systems Constitutional: No fever/chills Eyes: No visual changes. ENT: No sore  throat. Cardiovascular: Denies chest pain. Respiratory: Denies shortness of breath. Gastrointestinal: No abdominal pain.  Positive for rectal bleeding. Genitourinary: Negative for dysuria. Musculoskeletal: Negative for back pain. Skin: Negative for rash. Neurological: Negative for headaches, focal weakness or numbness.  ____________________________________________   PHYSICAL EXAM:  VITAL SIGNS: ED Triage Vitals  Enc Vitals Group     BP 06/30/17 2000 (!) 98/50     Pulse Rate 06/30/17 2000 (!) 114     Resp --      Temp 06/30/17 2000 (!) 97.4 F (36.3 C)     Temp Source 06/30/17 2000 Oral     SpO2 06/30/17 2000 92 %     Weight 06/30/17 2004 132 lb (59.9 kg)     Height 06/30/17 2004 _0  (1.676 m)   Constitutional: Alert and oriented. Well appearing and in no distress. Eyes: Conjunctivae are normal.  ENT   Head: Normocephalic and atraumatic.   Nose: No congestion/rhinnorhea.   Mouth/Throat: Mucous membranes are moist.   Neck: No stridor. Hematological/Lymphatic/Immunilogical: No cervical lymphadenopathy. Cardiovascular: Tachycardic,  regular rhythm.  No murmurs, rubs, or gallops.  Respiratory: Normal respiratory effort without tachypnea nor retractions. Breath sounds are clear and equal bilaterally. No wheezes/rales/rhonchi. Gastrointestinal: Soft and non tender. No rebound. No guarding.  Genitourinary: Deferred Musculoskeletal: Normal range of motion in all extremities. No lower extremity edema. Neurologic:  Normal speech and language. No gross focal neurologic deficits are appreciated.  Skin:  Skin is warm, dry and intact. No rash noted. Psychiatric: Mood and affect are normal. Speech and behavior are normal. Patient exhibits appropriate insight and judgment.  ____________________________________________    LABS (pertinent positives/negatives)  WBC 16.1 Hgb 11.1  ____________________________________________   EKG  I, Nance Pear, attending  physician, personally viewed and interpreted this EKG  EKG Time: 2008 Rate: 105 Rhythm: sinus tachycardia Axis: normal Intervals: qtc 471 QRS: narrow ST changes: no st elevation Impression: abnormal ekg   ____________________________________________    RADIOLOGY  None  ____________________________________________   PROCEDURES  Procedures  ____________________________________________   INITIAL IMPRESSION / ASSESSMENT AND PLAN / ED COURSE  Pertinent labs & imaging results that were available during my care of the patient were reviewed by me and considered in my medical decision making (see chart for details).  Patient presented to the emergency department today because of concerns for rectal bleeding. Initial hemoglobin 11.1. This was similar to hemoglobin at our facility a few months ago. Discussed with GI physician at [facility] Dr. Lysle Rubens who performed the procedure today. Will plan on transfer to [facility].   ____________________________________________   FINAL CLINICAL IMPRESSION(S) / ED DIAGNOSES  Rectal bleeding  Note: This dictation was prepared with Dragon dictation. Any transcriptional errors that result from this process are unintentional     Nance Pear, MD 07/02/17 1655

## 2017-06-30 NOTE — ED Triage Notes (Signed)
Pt arrived via [facility] from home.  Pt had polyp removed from bile duct earlier today at [facility] by Dr. Lysle Rubens.  Pt was told to expect some bleeding, but pt reports that she has had a large amount of rectal bleeding and diarrhea approx 30 minutes ago.  Per EMS it looked to be approx 100cc of bright red blood.  Per EMS, pt is on chronic 3LNC, blood sugar was 228, pt has not eaten today.  Per EMS pt was tachycardic.  Pt is A&Ox4, uncooperative for staff at this time.  Pt repeatedly asking for blanket and refusing to allow staff to take vitals and move around to allow staff to complete tasks.  Pt repeatedly told by staff importance of getting tasks done.  Pt verbalizes understanding, but repeatedly being uncooperative.

## 2017-07-01 ENCOUNTER — Ambulatory Visit (HOSPITAL_COMMUNITY)
Admission: AD | Admit: 2017-07-01 | Discharge: 2017-07-01 | Disposition: A | Discharge status: Home / Self Care | Payer: Medicare Other | Source: Other Acute Inpatient Hospital | Attending: Emergency Medicine | Admitting: Emergency Medicine

## 2017-07-01 DIAGNOSIS — K922 Gastrointestinal hemorrhage, unspecified: Secondary | ICD-10-CM | POA: Insufficient documentation

## 2017-07-01 DIAGNOSIS — K625 Hemorrhage of anus and rectum: Secondary | ICD-10-CM | POA: Diagnosis not present

## 2017-07-01 LAB — CBC
HEMATOCRIT: 26.5 % — AB (ref 35.0–47.0)
Hemoglobin: 8.8 g/dL — ABNORMAL LOW (ref 12.0–16.0)
MCH: 31.7 pg (ref 26.0–34.0)
MCHC: 33.3 g/dL (ref 32.0–36.0)
MCV: 95.3 fL (ref 80.0–100.0)
Platelets: 184 10*3/uL (ref 150–440)
RBC: 2.78 MIL/uL — ABNORMAL LOW (ref 3.80–5.20)
RDW: 14.5 % (ref 11.5–14.5)
WBC: 14.5 10*3/uL — ABNORMAL HIGH (ref 3.6–11.0)

## 2017-07-01 NOTE — ED Provider Notes (Signed)
-----------------------------------------   12:48 AM on 07/01/2017 -----------------------------------------   Blood pressure 122/69, pulse (!) 105, temperature (!) 97.4 F (36.3 C), temperature source Oral, resp. rate 19, height 5\' 6"  (1.676 m), weight 59.9 kg (132 lb), SpO2 99 %.  Assuming care from Dr. Archie Balboa.  In short, Margaret Stevenson is a 71 y.o. female with a chief complaint of Rectal Bleeding .  Refer to the original H&P for additional details.  The current plan of care is to transfer the patient to [name] for further evaluation. .     The patient was accepted to [facility] emergency Department.   Loney Hering, MD 07/01/17 [phone]

## 2017-08-04 ENCOUNTER — Ambulatory Visit
Admission: RE | Admit: 2017-08-04 | Discharge: 2017-08-04 | Disposition: A | Discharge status: Home / Self Care | Payer: Medicare Other | Source: Ambulatory Visit | Attending: Family Medicine | Admitting: Family Medicine

## 2017-08-04 DIAGNOSIS — Z1231 Encounter for screening mammogram for malignant neoplasm of breast: Secondary | ICD-10-CM

## 2017-08-15 ENCOUNTER — Telehealth: Payer: Self-pay

## 2017-08-15 NOTE — Telephone Encounter (Signed)
-----   Message from [place], [place] sent at 07/03/2017 10:10 AM EDT ----- Pt is requesting [facility] to remove Stent that [facility] that placed. Check with Allen Norris if he is okay to remove.

## 2017-09-08 ENCOUNTER — Telehealth: Payer: Self-pay | Admitting: Gastroenterology

## 2017-09-08 NOTE — Telephone Encounter (Signed)
ERCP pancreatic stent removal scheduled at [facility] on 09/30/17.

## 2017-09-08 NOTE — Telephone Encounter (Signed)
Needs advise ASAP regarding medical issue going to [facility].

## 2017-09-09 ENCOUNTER — Other Ambulatory Visit: Payer: Self-pay

## 2017-09-09 DIAGNOSIS — K838 Other specified diseases of biliary tract: Secondary | ICD-10-CM

## 2017-09-15 ENCOUNTER — Telehealth: Payer: Self-pay | Admitting: Gastroenterology

## 2017-09-15 ENCOUNTER — Other Ambulatory Visit: Payer: Self-pay

## 2017-09-15 NOTE — Telephone Encounter (Signed)
Advised pt she is on the schedule for 09/30/17 with [facility]. She should be receiving her paperwork anytime now. Advised her to contact him if she hasn't received in a few days.

## 2017-09-15 NOTE — Telephone Encounter (Signed)
Patient left a voice message that she never received any paperwork for her procedure. Is she still having it? Please call

## 2017-09-30 ENCOUNTER — Ambulatory Visit: Payer: Medicare Other | Admitting: Anesthesiology

## 2017-09-30 ENCOUNTER — Ambulatory Visit
Admission: RE | Admit: 2017-09-30 | Discharge: 2017-09-30 | Disposition: A | Discharge status: Home / Self Care | Payer: Medicare Other | Source: Ambulatory Visit | Attending: Gastroenterology | Admitting: Gastroenterology

## 2017-09-30 ENCOUNTER — Encounter
Admission: RE | Disposition: A | Discharge status: Home / Self Care | Payer: Self-pay | Source: Ambulatory Visit | Attending: Gastroenterology

## 2017-09-30 ENCOUNTER — Encounter: Payer: Self-pay | Admitting: Anesthesiology

## 2017-09-30 ENCOUNTER — Ambulatory Visit: Payer: Medicare Other

## 2017-09-30 DIAGNOSIS — F419 Anxiety disorder, unspecified: Secondary | ICD-10-CM | POA: Insufficient documentation

## 2017-09-30 DIAGNOSIS — F17210 Nicotine dependence, cigarettes, uncomplicated: Secondary | ICD-10-CM | POA: Diagnosis not present

## 2017-09-30 DIAGNOSIS — Z4659 Encounter for fitting and adjustment of other gastrointestinal appliance and device: Secondary | ICD-10-CM | POA: Insufficient documentation

## 2017-09-30 DIAGNOSIS — M19041 Primary osteoarthritis, right hand: Secondary | ICD-10-CM | POA: Insufficient documentation

## 2017-09-30 DIAGNOSIS — K838 Other specified diseases of biliary tract: Secondary | ICD-10-CM | POA: Insufficient documentation

## 2017-09-30 DIAGNOSIS — Z79899 Other long term (current) drug therapy: Secondary | ICD-10-CM | POA: Insufficient documentation

## 2017-09-30 DIAGNOSIS — Z880 Allergy status to penicillin: Secondary | ICD-10-CM | POA: Insufficient documentation

## 2017-09-30 DIAGNOSIS — K839 Disease of biliary tract, unspecified: Secondary | ICD-10-CM | POA: Diagnosis not present

## 2017-09-30 DIAGNOSIS — Z9981 Dependence on supplemental oxygen: Secondary | ICD-10-CM | POA: Diagnosis not present

## 2017-09-30 DIAGNOSIS — Z85820 Personal history of malignant melanoma of skin: Secondary | ICD-10-CM | POA: Insufficient documentation

## 2017-09-30 DIAGNOSIS — K8309 Other cholangitis: Secondary | ICD-10-CM | POA: Insufficient documentation

## 2017-09-30 DIAGNOSIS — J449 Chronic obstructive pulmonary disease, unspecified: Secondary | ICD-10-CM | POA: Diagnosis not present

## 2017-09-30 DIAGNOSIS — M19042 Primary osteoarthritis, left hand: Secondary | ICD-10-CM | POA: Insufficient documentation

## 2017-09-30 HISTORY — PX: ERCP: SHX5425

## 2017-09-30 SURGERY — ERCP, WITH INTERVENTION IF INDICATED
Anesthesia: General

## 2017-09-30 MED ORDER — ONDANSETRON HCL 4 MG/2ML IJ SOLN
INTRAMUSCULAR | Status: DC | PRN
  Administered 2017-09-30: 4 mg via INTRAVENOUS

## 2017-09-30 MED ORDER — PROPOFOL 10 MG/ML IV BOLUS
INTRAVENOUS | Status: DC | PRN
  Administered 2017-09-30: 30 mg via INTRAVENOUS
  Administered 2017-09-30: 20 mg via INTRAVENOUS

## 2017-09-30 MED ORDER — PROPOFOL 500 MG/50ML IV EMUL
INTRAVENOUS | Status: AC
  Filled 2017-09-30: qty 50

## 2017-09-30 MED ORDER — GLYCOPYRROLATE 0.2 MG/ML IJ SOLN
INTRAMUSCULAR | Status: AC
  Filled 2017-09-30: qty 1

## 2017-09-30 MED ORDER — ONDANSETRON HCL 4 MG/2ML IJ SOLN
INTRAMUSCULAR | Status: AC
  Filled 2017-09-30: qty 2

## 2017-09-30 MED ORDER — IPRATROPIUM-ALBUTEROL 0.5-2.5 (3) MG/3ML IN SOLN: 3.0000 mL | Freq: Once | RESPIRATORY_TRACT | Status: DC

## 2017-09-30 MED ORDER — GLYCOPYRROLATE 0.2 MG/ML IJ SOLN
INTRAMUSCULAR | Status: DC | PRN
  Administered 2017-09-30: 0.2 mg via INTRAVENOUS

## 2017-09-30 MED ORDER — SODIUM CHLORIDE 0.9 % IV SOLN: INTRAVENOUS | Status: DC

## 2017-09-30 MED ORDER — LIDOCAINE HCL (CARDIAC) 20 MG/ML IV SOLN
INTRAVENOUS | Status: DC | PRN
  Administered 2017-09-30: 50 mg via INTRAVENOUS

## 2017-09-30 MED ORDER — SODIUM CHLORIDE 0.9 % IV SOLN
INTRAVENOUS | Status: DC
  Administered 2017-09-30: 1000 mL via INTRAVENOUS

## 2017-09-30 MED ORDER — DEXAMETHASONE SODIUM PHOSPHATE 10 MG/ML IJ SOLN
INTRAMUSCULAR | Status: AC
  Filled 2017-09-30: qty 1

## 2017-09-30 MED ORDER — PROPOFOL 500 MG/50ML IV EMUL
INTRAVENOUS | Status: DC | PRN
  Administered 2017-09-30: 175 ug/kg/min via INTRAVENOUS

## 2017-09-30 MED ORDER — DEXAMETHASONE SODIUM PHOSPHATE 10 MG/ML IJ SOLN
INTRAMUSCULAR | Status: DC | PRN
  Administered 2017-09-30: 10 mg via INTRAVENOUS

## 2017-09-30 NOTE — Anesthesia Postprocedure Evaluation (Signed)
Anesthesia Post Note  Patient: Margaret Stevenson  Procedure(s) Performed: ENDOSCOPIC RETROGRADE CHOLANGIOPANCREATOGRAPHY (ERCP) Pancreatic stent removal (N/A )  Patient location during evaluation: Endoscopy Anesthesia Type: General Level of consciousness: awake and alert Pain management: pain level controlled Vital Signs Assessment: post-procedure vital signs reviewed and stable Respiratory status: spontaneous breathing, nonlabored ventilation, respiratory function stable and patient connected to nasal cannula oxygen Cardiovascular status: blood pressure returned to baseline and stable Postop Assessment: no apparent nausea or vomiting Anesthetic complications: no     Last Vitals:  Vitals:   09/30/17 1150 09/30/17 1200  BP: 132/68 (!) 141/74  Pulse: (!) 108 99  Resp: (!) 22 (!) 23  Temp:    SpO2: 97% 99%    Last Pain:  Vitals:   09/30/17 1130  TempSrc: Tympanic                 Precious Haws Makena Murdock

## 2017-09-30 NOTE — H&P (Signed)
Lucilla Lame, MD [facility] [address] [place], [place] [address] [phone] Fax : [phone]  Primary Care Physician:  Dion Body, MD Primary Gastroenterologist:  Dr. Allen Norris  Pre-Procedure History & Physical: HPI:  Margaret Stevenson is a 71 y.o. female is here for an ERCP.   Past Medical History:  Diagnosis Date  . Arthritis    hands  . Cancer ([place])    melanoma 07/2013  . COPD (chronic obstructive pulmonary disease) ([place])   . Dyspnea   . Jaundice 04/2017    Past Surgical History:  Procedure Laterality Date  . BUNIONECTOMY    . COLONOSCOPY    . COLONOSCOPY WITH PROPOFOL N/A 05/26/2017   Procedure: COLONOSCOPY WITH PROPOFOL;  Surgeon: Lucilla Lame, MD;  Location: [place];  Service: Endoscopy;  Laterality: N/A;  please do not move appt time  . ERCP N/A 04/25/2017   Procedure: ENDOSCOPIC RETROGRADE CHOLANGIOPANCREATOGRAPHY (ERCP);  Surgeon: Lucilla Lame, MD;  Location: [facility] ENDOSCOPY;  Service: Endoscopy;  Laterality: N/A;  . POLYPECTOMY  05/26/2017   Procedure: POLYPECTOMY;  Surgeon: Lucilla Lame, MD;  Location: [place];  Service: Endoscopy;;  . TONSILLECTOMY      Prior to Admission medications   Medication Sig Start Date End Date Taking? Authorizing Provider  Fluticasone-Salmeterol (ADVAIR) 250-50 MCG/DOSE AEPB Inhale 1 puff into the lungs 2 (two) times daily.   Yes [provider]  Na Sulfate-K Sulfate-Mg Sulf (SUPREP BOWEL PREP KIT) 17.5-3.13-1.6 GM/180ML SOLN Take 1 kit by mouth as directed. 05/08/17  Yes Lucilla Lame, MD  OXYGEN Inhale 3 L into the lungs. 1 L when not active   Yes [provider]  TURMERIC PO Take by mouth.   Yes [provider]  albuterol (VENTOLIN HFA) 108 (90 Base) MCG/ACT inhaler Inhale 2 puffs into the lungs every 4 (four) hours as needed for shortness of breath.  08/27/16 08/27/17  [provider]  tiotropium (SPIRIVA) 18 MCG inhalation capsule Place 18 mcg into inhaler  and inhale at bedtime.  08/27/16 08/27/17  [provider]    Allergies as of 09/09/2017 - Review Complete 05/26/2017  Allergen Reaction Noted  . Penicillins Hives 08/02/2016    Family History  Problem Relation Age of Onset  . Breast cancer Mother 47    Social History   Social History  . Marital status: Single    Spouse name: N/A  . Number of children: N/A  . Years of education: N/A   Occupational History  . Not on file.   Social History Main Topics  . Smoking status: Current Every Day Smoker    Packs/day: 1.00    Years: 57.00    Types: Cigarettes  . Smokeless tobacco: Never Used  . Alcohol use 4.2 oz/week    7 Glasses of wine per week  . Drug use: No  . Sexual activity: Not Currently   Other Topics Concern  . Not on file   Social History Narrative  . No narrative on file    Review of Systems: See HPI, otherwise negative ROS  Physical Exam: BP (!) 140/57   Pulse (!) 103   Temp 97.7 F (36.5 C) (Tympanic)   Resp (!) 24   Ht _0  (1.676 m)   Wt 133 lb (60.3 kg)   SpO2 95%   BMI 21.47 kg/m  General:   Alert,  pleasant and cooperative in NAD Head:  Normocephalic and atraumatic. Neck:  Supple; no masses or thyromegaly. Lungs:  Clear throughout to auscultation.  Heart:  Regular rate and rhythm. Abdomen:  Soft, nontender and nondistended. Normal bowel sounds, without guarding, and without rebound.   Neurologic:  Alert and  oriented x4;  grossly normal neurologically.  Impression/Plan: Margaret Stevenson is here for an ERCP to be performed for Stent removal  Risks, benefits, limitations, and alternatives regarding  ERCP have been reviewed with the patient.  Questions have been answered.  All parties agreeable.   Lucilla Lame, MD  09/30/2017, 10:56 AM

## 2017-09-30 NOTE — Transfer of Care (Signed)
Immediate Anesthesia Transfer of Care Note  Patient: Margaret Stevenson  Procedure(s) Performed: ENDOSCOPIC RETROGRADE CHOLANGIOPANCREATOGRAPHY (ERCP) Pancreatic stent removal (N/A )  Patient Location: PACU  Anesthesia Type:General  Level of Consciousness: sedated  Airway & Oxygen Therapy: Patient Spontanous Breathing and Patient connected to nasal cannula oxygen  Post-op Assessment: Report given to RN and Post -op Vital signs reviewed and stable  Post vital signs: Reviewed and stable  Last Vitals:  Vitals:   09/30/17 1029 09/30/17 1130  BP: (!) 140/57 (!) 147/132  Pulse: (!) 103 (!) 106  Resp: (!) 24 (!) 31  Temp: 36.5 C (!) 35.7 C  SpO2: 95% 91%    Last Pain:  Vitals:   09/30/17 1130  TempSrc: Tympanic         Complications: No apparent anesthesia complications

## 2017-09-30 NOTE — Anesthesia Preprocedure Evaluation (Addendum)
Anesthesia Evaluation  Patient identified by MRN, date of birth, ID band  Reviewed: Allergy & Precautions, NPO status , Patient's Chart, lab work & pertinent test results  History of Anesthesia Complications Negative for: history of anesthetic complications  Airway Mallampati: II  TM Distance: <3 FB Neck ROM: limited    Dental  (+) Caps, Poor Dentition, Chipped   Pulmonary shortness of breath and with exertion, asthma , pneumonia, COPD,  COPD inhaler and oxygen dependent, Current Smoker,    Pulmonary exam normal        Cardiovascular Exercise Tolerance: Poor (-) Past MI Normal cardiovascular exam     Neuro/Psych PSYCHIATRIC DISORDERS Anxiety negative neurological ROS     GI/Hepatic neg GERD  ,  Endo/Other    Renal/GU      Musculoskeletal  (+) Arthritis ,   Abdominal Normal abdominal exam  (+)   Peds  Hematology   Anesthesia Other Findings Past Medical History: No date: Arthritis     Comment:  hands No date: Cancer ([place])     Comment:  melanoma 07/2013 No date: COPD (chronic obstructive pulmonary disease) ([facility]) No date: Dyspnea 04/2017: Jaundice   Reproductive/Obstetrics                            Anesthesia Physical  Anesthesia Plan  ASA: IV  Anesthesia Plan: General   Post-op Pain Management:    Induction: Intravenous  PONV Risk Score and Plan: Propofol infusion  Airway Management Planned: Nasal Cannula and Natural Airway  Additional Equipment:   Intra-op Plan:   Post-operative Plan:   Informed Consent: I have reviewed the patients History and Physical, chart, labs and discussed the procedure including the risks, benefits and alternatives for the proposed anesthesia with the patient or authorized representative who has indicated his/her understanding and acceptance.   Dental advisory given  Plan Discussed with: CRNA and Surgeon  Anesthesia Plan Comments: (Patient  consented for risks of anesthesia including but not limited to:  - adverse reactions to medications - risk of intubation if required - damage to teeth, lips or other oral mucosa - sore throat or hoarseness - Damage to heart, brain, lungs or loss of life  Patient voiced understanding.)        Anesthesia Quick Evaluation

## 2017-09-30 NOTE — Op Note (Signed)
[facility] Gastroenterology Patient Name: Margaret Stevenson Procedure Date: 09/30/2017 10:55 AM MRN: [id number] Account #: [id number] Date of Birth: 06-13-1946 Admit Type: Outpatient Age: 71 Room: [facility] ENDO ROOM 4 Gender: Female Note Status: Finalized Procedure:            ERCP Indications:          Stent removal after ampelectomy @[facility]  Providers:            Lucilla Lame MD, MD Referring MD:         Dion Body (Referring MD) Medicines:            Propofol per Anesthesia Complications:        No immediate complications. Procedure:            Pre-Anesthesia Assessment:                       - Prior to the procedure, a History and Physical was                        performed, and patient medications and allergies were                        reviewed. The patient's tolerance of previous                        anesthesia was also reviewed. The risks and benefits of                        the procedure and the sedation options and risks were                        discussed with the patient. All questions were                        answered, and informed consent was obtained. Prior                        Anticoagulants: The patient has taken no previous                        anticoagulant or antiplatelet agents. ASA Grade                        Assessment: II - A patient with mild systemic disease.                        After reviewing the risks and benefits, the patient was                        deemed in satisfactory condition to undergo the                        procedure.                       After obtaining informed consent, the scope was passed                        under direct vision. Throughout the procedure, the  patient's blood pressure, pulse, and oxygen saturations                        were monitored continuously. The Endosonoscope was                        introduced through the mouth, and used to inject             contrast into and used to inject contrast into the bile                        duct. The ERCP was accomplished without difficulty. The                        patient tolerated the procedure well. Findings:      A biliary stent was visible on the scout film. , The major papilla was       prominent. The major papilla was bulging. One covered metal stent       originating in the common bile duct was emerging from the major papilla.       The stent was visibly patent. One stent was removed from the common bile       duct using a regular forceps. A wire was passed into the biliary tree.       The bile duct was deeply cannulated with the 15 mm balloon. Contrast was       injected. I personally interpreted the bile duct images. There was brisk       flow of contrast through the ducts. Image quality was excellent.       Contrast extended to the hepatic ducts. The biliary tree was swept with       a basket starting at the bifurcation. Sludge was swept from the duct.       This was biopsied with a cold forceps for histology of the ampulla. Impression:           - The major papilla appeared to be prominent.                       - The major papilla appeared to be bulging.                       - One visibly metal patent stent from the common bile                        duct was seen in the major papilla.                       - One stent was removed from the common bile duct.                       - The biliary tree was swept and sludge was found.                       - Biopsies of the ampulla were taken. Recommendation:       - Watch for pancreatitis, bleeding, perforation, and                        cholangitis.                       -  Discharge patient to home.                       - Continue present medications.                       - Await path results. Procedure Code(s):    --- Professional ---                       [phone], Endoscopic retrograde cholangiopancreatography                         (ERCP); with removal of foreign body(s) or stent(s)                        from biliary/pancreatic duct(s)                       43264, Endoscopic retrograde cholangiopancreatography                        (ERCP); with removal of calculi/debris from                        biliary/pancreatic duct(s)                       43261, Endoscopic retrograde cholangiopancreatography                        (ERCP); with biopsy, single or multiple                       74328, Endoscopic catheterization of the biliary ductal                        system, radiological supervision and interpretation Diagnosis Code(s):    --- Professional ---                       W10.27, Encounter for fitting and adjustment of other                        gastrointestinal appliance and device                       K83.9, Disease of biliary tract, unspecified                       Z96.89, Presence of other specified functional implants CPT copyright 2016 American Medical Association. All rights reserved. The codes documented in this report are preliminary and upon coder review may  be revised to meet current compliance requirements. Lucilla Lame MD, MD 09/30/2017 11:37:24 AM This report has been signed electronically. Number of Addenda: 0 Note Initiated On: 09/30/2017 10:55 AM      [facility]

## 2017-09-30 NOTE — Anesthesia Post-op Follow-up Note (Signed)
Anesthesia QCDR form completed.        

## 2017-09-30 NOTE — Anesthesia Procedure Notes (Signed)
Date/Time: 09/30/2017 10:56 AM Performed by: Johnna Acosta Pre-anesthesia Checklist: Patient identified, Emergency Drugs available, Patient being monitored, Suction available and Timeout performed Patient Re-evaluated:Patient Re-evaluated prior to induction Oxygen Delivery Method: Nasal cannula

## 2017-10-01 ENCOUNTER — Encounter: Payer: Self-pay | Admitting: Gastroenterology

## 2017-10-01 LAB — SURGICAL PATHOLOGY

## 2017-10-03 ENCOUNTER — Telehealth: Payer: Self-pay

## 2017-10-03 NOTE — Telephone Encounter (Signed)
-----   Message from Lucilla Lame, MD sent at 10/02/2017  5:57 PM EDT ----- The patient know that the biopsies of the ampulla during her ERCP were negative for any precancerous tissue.

## 2017-10-03 NOTE — Telephone Encounter (Signed)
Pt notified of result

## 2017-12-22 ENCOUNTER — Telehealth: Payer: Self-pay | Admitting: Gastroenterology

## 2017-12-22 NOTE — Telephone Encounter (Signed)
Patient called & l/m on our voice mail. She is a patient of Dr Dorothey Baseman & has been taken iron.She would like to have a blood test to see if she still needs to continue to take it.

## 2017-12-23 ENCOUNTER — Other Ambulatory Visit: Payer: Self-pay

## 2017-12-23 NOTE — Telephone Encounter (Signed)
Advised pt of her recent Hgb level from 12/18/17. Pt will follow up with PCP as needed.

## 2018-01-24 ENCOUNTER — Other Ambulatory Visit: Payer: Self-pay

## 2018-01-24 ENCOUNTER — Inpatient Hospital Stay: Payer: Medicare Other

## 2018-01-24 ENCOUNTER — Emergency Department: Payer: Medicare Other

## 2018-01-24 ENCOUNTER — Inpatient Hospital Stay
Admission: EM | Admit: 2018-01-24 | Discharge: 2018-01-27 | DRG: 193 | Disposition: A | Discharge status: Home Health | Payer: Medicare Other | Attending: Internal Medicine | Admitting: Internal Medicine

## 2018-01-24 DIAGNOSIS — Z880 Allergy status to penicillin: Secondary | ICD-10-CM

## 2018-01-24 DIAGNOSIS — M19041 Primary osteoarthritis, right hand: Secondary | ICD-10-CM | POA: Diagnosis present

## 2018-01-24 DIAGNOSIS — J9600 Acute respiratory failure, unspecified whether with hypoxia or hypercapnia: Secondary | ICD-10-CM | POA: Diagnosis present

## 2018-01-24 DIAGNOSIS — J1100 Influenza due to unidentified influenza virus with unspecified type of pneumonia: Secondary | ICD-10-CM

## 2018-01-24 DIAGNOSIS — E861 Hypovolemia: Secondary | ICD-10-CM | POA: Diagnosis present

## 2018-01-24 DIAGNOSIS — F17210 Nicotine dependence, cigarettes, uncomplicated: Secondary | ICD-10-CM | POA: Diagnosis present

## 2018-01-24 DIAGNOSIS — F17200 Nicotine dependence, unspecified, uncomplicated: Secondary | ICD-10-CM | POA: Diagnosis not present

## 2018-01-24 DIAGNOSIS — M19042 Primary osteoarthritis, left hand: Secondary | ICD-10-CM | POA: Diagnosis present

## 2018-01-24 DIAGNOSIS — Z7951 Long term (current) use of inhaled steroids: Secondary | ICD-10-CM

## 2018-01-24 DIAGNOSIS — J101 Influenza due to other identified influenza virus with other respiratory manifestations: Secondary | ICD-10-CM | POA: Diagnosis present

## 2018-01-24 DIAGNOSIS — Z803 Family history of malignant neoplasm of breast: Secondary | ICD-10-CM | POA: Diagnosis not present

## 2018-01-24 DIAGNOSIS — Z9981 Dependence on supplemental oxygen: Secondary | ICD-10-CM | POA: Diagnosis not present

## 2018-01-24 DIAGNOSIS — R911 Solitary pulmonary nodule: Secondary | ICD-10-CM | POA: Diagnosis not present

## 2018-01-24 DIAGNOSIS — J441 Chronic obstructive pulmonary disease with (acute) exacerbation: Secondary | ICD-10-CM | POA: Diagnosis present

## 2018-01-24 DIAGNOSIS — Z85820 Personal history of malignant melanoma of skin: Secondary | ICD-10-CM

## 2018-01-24 DIAGNOSIS — J9621 Acute and chronic respiratory failure with hypoxia: Secondary | ICD-10-CM | POA: Diagnosis present

## 2018-01-24 DIAGNOSIS — J1001 Influenza due to other identified influenza virus with the same other identified influenza virus pneumonia: Principal | ICD-10-CM | POA: Diagnosis present

## 2018-01-24 DIAGNOSIS — J449 Chronic obstructive pulmonary disease, unspecified: Secondary | ICD-10-CM | POA: Diagnosis present

## 2018-01-24 LAB — BASIC METABOLIC PANEL
Anion gap: 11 (ref 5–15)
BUN: 9 mg/dL (ref 6–20)
CALCIUM: 9 mg/dL (ref 8.9–10.3)
CO2: 25 mmol/L (ref 22–32)
Chloride: 98 mmol/L — ABNORMAL LOW (ref 101–111)
Creatinine, Ser: 0.72 mg/dL (ref 0.44–1.00)
GFR calc Af Amer: >60 mL/min (ref >60)
GFR calc non Af Amer: >60 mL/min (ref >60)
GLUCOSE: 129 mg/dL — AB (ref 65–99)
Potassium: 4.1 mmol/L (ref 3.5–5.1)
Sodium: 134 mmol/L — ABNORMAL LOW (ref 135–145)

## 2018-01-24 LAB — CBC
HCT: 47.3 % — ABNORMAL HIGH (ref 35.0–47.0)
Hemoglobin: 15.7 g/dL (ref 12.0–16.0)
MCH: 31.4 pg (ref 26.0–34.0)
MCHC: 33.1 g/dL (ref 32.0–36.0)
MCV: 94.8 fL (ref 80.0–100.0)
Platelets: 225 10*3/uL (ref 150–440)
RBC: 4.98 MIL/uL (ref 3.80–5.20)
RDW: 14.7 % — ABNORMAL HIGH (ref 11.5–14.5)
WBC: 8.5 10*3/uL (ref 3.6–11.0)

## 2018-01-24 LAB — GLUCOSE, CAPILLARY
GLUCOSE-CAPILLARY: 112 mg/dL — AB (ref 65–99)
Glucose-Capillary: 151 mg/dL — ABNORMAL HIGH (ref 65–99)

## 2018-01-24 LAB — INFLUENZA PANEL BY PCR (TYPE A & B)
Influenza A By PCR: POSITIVE — AB
Influenza B By PCR: NEGATIVE

## 2018-01-24 LAB — TROPONIN I

## 2018-01-24 MED ORDER — DOCUSATE SODIUM 100 MG PO CAPS
100.0000 mg | ORAL_CAPSULE | Freq: Two times a day (BID) | ORAL | Status: DC
  Administered 2018-01-24 – 2018-01-27 (×4): 100 mg via ORAL
  Filled 2018-01-24 (×5): qty 1

## 2018-01-24 MED ORDER — TIOTROPIUM BROMIDE MONOHYDRATE 18 MCG IN CAPS
18.0000 ug | ORAL_CAPSULE | Freq: Every day | RESPIRATORY_TRACT | Status: DC
  Administered 2018-01-24 – 2018-01-26 (×3): 18 ug via RESPIRATORY_TRACT
  Filled 2018-01-24: qty 5

## 2018-01-24 MED ORDER — SODIUM CHLORIDE 0.9 % IV SOLN
500.0000 mg | INTRAVENOUS | Status: DC
  Administered 2018-01-24 – 2018-01-25 (×2): 500 mg via INTRAVENOUS
  Filled 2018-01-24 (×3): qty 500

## 2018-01-24 MED ORDER — SODIUM CHLORIDE 0.9 % IV BOLUS (SEPSIS)
1000.0000 mL | Freq: Once | INTRAVENOUS | Status: AC
  Administered 2018-01-24: 1000 mL via INTRAVENOUS

## 2018-01-24 MED ORDER — METHYLPREDNISOLONE SODIUM SUCC 125 MG IJ SOLR
125.0000 mg | Freq: Once | INTRAMUSCULAR | Status: AC
  Administered 2018-01-24: 125 mg via INTRAVENOUS
  Filled 2018-01-24: qty 2

## 2018-01-24 MED ORDER — MOMETASONE FURO-FORMOTEROL FUM 200-5 MCG/ACT IN AERO
2.0000 | INHALATION_SPRAY | Freq: Two times a day (BID) | RESPIRATORY_TRACT | Status: DC
  Administered 2018-01-24 – 2018-01-27 (×6): 2 via RESPIRATORY_TRACT
  Filled 2018-01-24: qty 8.8

## 2018-01-24 MED ORDER — SODIUM CHLORIDE 0.9 % IV SOLN
INTRAVENOUS | Status: DC
  Administered 2018-01-24 – 2018-01-25 (×2): via INTRAVENOUS

## 2018-01-24 MED ORDER — METHYLPREDNISOLONE SODIUM SUCC 125 MG IJ SOLR
60.0000 mg | Freq: Four times a day (QID) | INTRAMUSCULAR | Status: DC
  Administered 2018-01-24 – 2018-01-25 (×3): 60 mg via INTRAVENOUS
  Filled 2018-01-24 (×3): qty 2

## 2018-01-24 MED ORDER — INSULIN ASPART 100 UNIT/ML ~~LOC~~ SOLN
0.0000 [IU] | SUBCUTANEOUS | Status: DC
  Administered 2018-01-24: 2 [IU] via SUBCUTANEOUS
  Administered 2018-01-25: 3 [IU] via SUBCUTANEOUS
  Administered 2018-01-25: 1 [IU] via SUBCUTANEOUS
  Administered 2018-01-25 (×3): 2 [IU] via SUBCUTANEOUS
  Filled 2018-01-24 (×5): qty 1

## 2018-01-24 MED ORDER — MORPHINE SULFATE (PF) 2 MG/ML IV SOLN: 2.0000 mg | INTRAVENOUS | Status: DC | PRN

## 2018-01-24 MED ORDER — IOPAMIDOL (ISOVUE-370) INJECTION 76%
75.0000 mL | Freq: Once | INTRAVENOUS | Status: AC | PRN
  Administered 2018-01-24: 75 mL via INTRAVENOUS

## 2018-01-24 MED ORDER — OSELTAMIVIR PHOSPHATE 75 MG PO CAPS
75.0000 mg | ORAL_CAPSULE | Freq: Two times a day (BID) | ORAL | Status: DC
  Administered 2018-01-24 – 2018-01-27 (×7): 75 mg via ORAL
  Filled 2018-01-24 (×7): qty 1

## 2018-01-24 MED ORDER — IPRATROPIUM-ALBUTEROL 0.5-2.5 (3) MG/3ML IN SOLN
3.0000 mL | Freq: Once | RESPIRATORY_TRACT | Status: AC
  Administered 2018-01-24: 3 mL via RESPIRATORY_TRACT
  Filled 2018-01-24: qty 3

## 2018-01-24 MED ORDER — ONDANSETRON HCL 4 MG/2ML IJ SOLN: 4.0000 mg | Freq: Four times a day (QID) | INTRAMUSCULAR | Status: DC | PRN

## 2018-01-24 MED ORDER — OSELTAMIVIR PHOSPHATE 75 MG PO CAPS
75.0000 mg | ORAL_CAPSULE | Freq: Once | ORAL | Status: DC
  Filled 2018-01-24: qty 1

## 2018-01-24 MED ORDER — IPRATROPIUM-ALBUTEROL 0.5-2.5 (3) MG/3ML IN SOLN: 3.0000 mL | RESPIRATORY_TRACT | Status: DC | PRN

## 2018-01-24 MED ORDER — ONDANSETRON HCL 4 MG PO TABS: 4.0000 mg | ORAL_TABLET | Freq: Four times a day (QID) | ORAL | Status: DC | PRN

## 2018-01-24 MED ORDER — PANTOPRAZOLE SODIUM 40 MG IV SOLR
40.0000 mg | Freq: Two times a day (BID) | INTRAVENOUS | Status: DC
  Administered 2018-01-24 – 2018-01-26 (×4): 40 mg via INTRAVENOUS
  Filled 2018-01-24 (×4): qty 40

## 2018-01-24 MED ORDER — ALBUTEROL SULFATE (2.5 MG/3ML) 0.083% IN NEBU: 2.5000 mg | INHALATION_SOLUTION | RESPIRATORY_TRACT | Status: DC | PRN

## 2018-01-24 MED ORDER — ALBUTEROL SULFATE (2.5 MG/3ML) 0.083% IN NEBU
5.0000 mg | INHALATION_SOLUTION | Freq: Once | RESPIRATORY_TRACT | Status: AC
  Administered 2018-01-24: 5 mg via RESPIRATORY_TRACT
  Filled 2018-01-24: qty 6

## 2018-01-24 MED ORDER — METHYLPREDNISOLONE SODIUM SUCC 125 MG IJ SOLR
INTRAMUSCULAR | Status: AC
  Administered 2018-01-24: 125 mg via INTRAVENOUS
  Filled 2018-01-24: qty 2

## 2018-01-24 MED ORDER — ACETAMINOPHEN 325 MG PO TABS: 650.0000 mg | ORAL_TABLET | Freq: Four times a day (QID) | ORAL | Status: DC | PRN

## 2018-01-24 MED ORDER — ENOXAPARIN SODIUM 40 MG/0.4ML ~~LOC~~ SOLN
40.0000 mg | SUBCUTANEOUS | Status: DC
  Administered 2018-01-24 – 2018-01-26 (×3): 40 mg via SUBCUTANEOUS
  Filled 2018-01-24 (×3): qty 0.4

## 2018-01-24 MED ORDER — BISACODYL 10 MG RE SUPP: 10.0000 mg | Freq: Every day | RECTAL | Status: DC | PRN

## 2018-01-24 MED ORDER — IPRATROPIUM-ALBUTEROL 0.5-2.5 (3) MG/3ML IN SOLN
3.0000 mL | Freq: Four times a day (QID) | RESPIRATORY_TRACT | Status: DC
  Administered 2018-01-24 – 2018-01-26 (×7): 3 mL via RESPIRATORY_TRACT
  Filled 2018-01-24 (×6): qty 3

## 2018-01-24 MED ORDER — ACETAMINOPHEN 650 MG RE SUPP: 650.0000 mg | Freq: Four times a day (QID) | RECTAL | Status: DC | PRN

## 2018-01-24 NOTE — Consult Note (Signed)
[facility] Consultation      Name: Margaret Stevenson MRN: [id number] DOB: March 11, 1946    ADMISSION DATE:  01/24/2018 CONSULTATION DATE:  01/24/2018  REFERRING MD :  Dr Doy Hutching   CHIEF COMPLAINT:     Short ness off breath   HISTORY OF PRESENT ILLNESS    Margaret Stevenson is a 72 y.o. female has a past medical history significant for COPD on 3L O2 at home.  She presented  with 2 day hx of worsening SOB and cough. In ER, pt markedly hypoxic requiring BiPAP. Influenza A positive. CXR shows no obvious pneumonia. She is now admitted. Afebrile. No N/V/D. Denies CP.  She reported non productive cough and very poor appetite.  She arrived in the ICU on BIPAP.  I have seen and examined her.    SIGNIFICANT EVENTS   NONE    PAST MEDICAL HISTORY    :  Past Medical History:  Diagnosis Date  . Arthritis    hands  . Cancer ([place])    melanoma 07/2013  . COPD (chronic obstructive pulmonary disease) ([place])   . Dyspnea   . Jaundice 04/2017   Past Surgical History:  Procedure Laterality Date  . BUNIONECTOMY    . COLONOSCOPY    . COLONOSCOPY WITH PROPOFOL N/A 05/26/2017   Procedure: COLONOSCOPY WITH PROPOFOL;  Surgeon: Lucilla Lame, MD;  Location: [facility];  Service: Endoscopy;  Laterality: N/A;  please do not move appt time  . ERCP N/A 04/25/2017   Procedure: ENDOSCOPIC RETROGRADE CHOLANGIOPANCREATOGRAPHY (ERCP);  Surgeon: Lucilla Lame, MD;  Location: [facility] ENDOSCOPY;  Service: Endoscopy;  Laterality: N/A;  . ERCP N/A 09/30/2017   Procedure: ENDOSCOPIC RETROGRADE CHOLANGIOPANCREATOGRAPHY (ERCP) Pancreatic stent removal;  Surgeon: Lucilla Lame, MD;  Location: [facility] ENDOSCOPY;  Service: Endoscopy;  Laterality: N/A;  . POLYPECTOMY  05/26/2017   Procedure: POLYPECTOMY;  Surgeon: Lucilla Lame, MD;  Location: [facility];  Service: Endoscopy;;  . TONSILLECTOMY     Prior to Admission medications   Medication Sig Start Date End Date Taking? Authorizing  Provider  albuterol (VENTOLIN HFA) 108 (90 Base) MCG/ACT inhaler Inhale 2 puffs into the lungs every 4 (four) hours as needed for shortness of breath.  08/27/16 01/24/19 Yes [provider]  Fluticasone-Salmeterol (ADVAIR) 250-50 MCG/DOSE AEPB Inhale 1 puff into the lungs 2 (two) times daily.   Yes [provider]  OXYGEN Inhale 3 L into the lungs. 1 L when not active   Yes [provider]  tiotropium (SPIRIVA) 18 MCG inhalation capsule Place 18 mcg into inhaler and inhale at bedtime.  08/27/16 01/24/19 Yes [provider]  TURMERIC PO Take by mouth.   Yes [provider]  Na Sulfate-K Sulfate-Mg Sulf (SUPREP BOWEL PREP KIT) 17.5-3.13-1.6 GM/180ML SOLN Take 1 kit by mouth as directed. Patient not taking: Reported on 01/24/2018 05/08/17   Lucilla Lame, MD   Allergies  Allergen Reactions  . Penicillins Hives    Has patient had a PCN reaction causing immediate rash, facial/tongue/throat swelling, SOB or lightheadedness with hypotension: no Has patient had a PCN reaction causing severe rash involving mucus membranes or skin necrosis: no Has patient had a PCN reaction that required hospitalization no Has patient had a PCN reaction occurring within the last 10 years: no If all of the above answers are "NO", then may proceed with Cephalosporin use.      FAMILY HISTORY   Family History  Problem Relation Age of Onset  . Breast cancer Mother  [place]    reports that she has been smoking cigarettes.  She has a 57.00 pack-year smoking history. she has never used smokeless tobacco. She reports that she drinks about 4.2 oz of alcohol per week. She reports that she does not use drugs.  ROS  The patient denies anorexia, fever, weight loss,, vision loss, decreased hearing, hoarseness, chest pain, syncope, peripheral edema, balance deficits, hemoptysis, abdominal pain, melena, hematochezia, severe indigestion/heartburn, hematuria, incontinence,  genital sores, muscle weakness, suspicious skin lesions, transient blindness, difficulty walking, depression, unusual weight change, abnormal bleeding, enlarged lymph nodes, angioedema, and breast masses.    VITAL SIGNS    Temp:  [98.3 F (36.8 C)-99.5 F (37.5 C)] 98.3 F (36.8 C) (02/16 1829) Pulse Rate:  [106-125] 106 (02/16 1900) Resp:  [21-32] 26 (02/16 1900) BP: (127-157)/(51-76) 130/55 (02/16 1900) SpO2:  [92 %-100 %] 93 % (02/16 1900) FiO2 (%):  [40 %] 40 % (02/16 1829) Weight:  [135 lb 12.9 oz (61.6 kg)-137 lb (62.1 kg)] 135 lb 12.9 oz (61.6 kg) (02/16 1827) HEMODYNAMICS:   VENTILATOR SETTINGS: FiO2 (%):  [40 %] 40 % INTAKE / OUTPUT: No intake or output data in the 24 hours ending 01/24/18 1916     PHYSICAL EXAM   General:  WDWN, [place]/AT, in severe  distress  Eyes: PERRL, EOMI, no scleral icterus, conjunctiva clear  ENT: moist oropharynx without exudate, TM's benign, dentition fair  Neck: supple, no lymphadenopathy. No bruits or thyromegaly  Cardiovascular: regular rate without MRG; 2+ peripheral pulses, no JVD, no peripheral edema  Respiratory: decreased breath sounds with expiratory wheezes. No rales or rhonchi. Respiratory effort increased  Abdomen: soft, non tender to palpation, positive bowel sounds, no guarding, no rebound  Skin: no rashes or lesions  Musculoskeletal: normal bulk and tone, no joint swelling  Psychiatric: normal mood and affect, A&OX3  Neurologic: CN 2-12 grossly intact, Motor strength 5/5 in all 4 groups with symmetric DTR's and non-focal sensory exam         LABS   LABS:  CBC Recent Labs  Lab 01/24/18 1310  WBC 8.5  HGB 15.7  HCT 47.3*  PLT 225   Coag's No results for input(s): APTT, INR in the last 168 hours. BMET Recent Labs  Lab 01/24/18 1310  NA 134*  K 4.1  CL 98*  CO2 25  BUN 9  CREATININE 0.72  GLUCOSE 129*   Electrolytes Recent Labs  Lab 01/24/18 1310  CALCIUM 9.0   Sepsis Markers No  results for input(s): LATICACIDVEN, PROCALCITON, O2SATVEN in the last 168 hours. ABG No results for input(s): PHART, PCO2ART, PO2ART in the last 168 hours. Liver Enzymes No results for input(s): AST, ALT, ALKPHOS, BILITOT, ALBUMIN in the last 168 hours. Cardiac Enzymes Recent Labs  Lab 01/24/18 1310  TROPONINI <0.03   Glucose Recent Labs  Lab 01/24/18 1830  GLUCAP 112*     No results found for this or any previous visit (from the past 240 hour(s)).   Current Facility-Administered Medications:  .  0.9 %  sodium chloride infusion, , Intravenous, Continuous, Sparks, Leonie Douglas, MD .  acetaminophen (TYLENOL) tablet 650 mg, 650 mg, Oral, Q6H PRN **OR** acetaminophen (TYLENOL) suppository 650 mg, 650 mg, Rectal, Q6H PRN, Idelle Crouch, MD .  azithromycin (ZITHROMAX) 500 mg in sodium chloride 0.9 % 250 mL IVPB, 500 mg, Intravenous, Q24H, Sparks, Leonie Douglas, MD .  bisacodyl (DULCOLAX) suppository 10 mg, 10 mg, Rectal, Daily PRN, Idelle Crouch,  MD .  docusate sodium (COLACE) capsule 100 mg, 100 mg, Oral, BID, Sparks, Leonie Douglas, MD .  enoxaparin (LOVENOX) injection 40 mg, 40 mg, Subcutaneous, Q24H, Sparks, Leonie Douglas, MD .  ipratropium-albuterol (DUONEB) 0.5-2.5 (3) MG/3ML nebulizer solution 3 mL, 3 mL, Nebulization, QID, Sparks, Leonie Douglas, MD .  ipratropium-albuterol (DUONEB) 0.5-2.5 (3) MG/3ML nebulizer solution 3 mL, 3 mL, Nebulization, Q4H PRN, [place] Dragon, MD .  methylPREDNISolone sodium succinate (SOLU-MEDROL) 125 mg/2 mL injection 60 mg, 60 mg, Intravenous, Q6H, Sparks, Leonie Douglas, MD .  mometasone-formoterol (DULERA) 200-5 MCG/ACT inhaler 2 puff, 2 puff, Inhalation, BID, Sparks, Leonie Douglas, MD .  morphine 2 MG/ML injection 2 mg, 2 mg, Intravenous, Q2H PRN, Idelle Crouch, MD .  ondansetron (ZOFRAN) tablet 4 mg, 4 mg, Oral, Q6H PRN **OR** ondansetron (ZOFRAN) injection 4 mg, 4 mg, Intravenous, Q6H PRN, Idelle Crouch, MD .  oseltamivir (TAMIFLU) capsule 75 mg, 75 mg,  Oral, BID, Idelle Crouch, MD, 75 mg at 01/24/18 1647 .  pantoprazole (PROTONIX) injection 40 mg, 40 mg, Intravenous, Q12H, Sparks, Leonie Douglas, MD .  tiotropium [facility]) inhalation capsule 18 mcg, 18 mcg, Inhalation, QHS, Idelle Crouch, MD  IMAGING    Dg Chest 2 View  Result Date: 01/24/2018 CLINICAL DATA:  Shortness of breath. EXAM: CHEST  2 VIEW COMPARISON:  Apr 27, 2017 FINDINGS: Mild scarring in the right base is stable. Mild atelectasis on the left. The heart, hila, and mediastinum are normal. No pulmonary nodules or masses. Hyperinflation of lungs with flattening of the diaphragms. No other acute abnormalities. IMPRESSION: Hyperinflation of lungs with flattening of the diaphragms. Scarring in the right base, unchanged. No acute abnormalities. Electronically Signed   By: Dorise Bullion III M.D   On: 01/24/2018 13:50   Ct Angio Chest Pe W Or Wo Contrast  Result Date: 01/24/2018 CLINICAL DATA:  72 y/o F; 2 days of worsening shortness of breath and cough. History of COPD. Influenza a positive. EXAM: CT ANGIOGRAPHY CHEST WITH CONTRAST TECHNIQUE: Multidetector CT imaging of the chest was performed using the standard protocol during bolus administration of intravenous contrast. Multiplanar CT image reconstructions and MIPs were obtained to evaluate the vascular anatomy. CONTRAST:  78m ISOVUE-370 IOPAMIDOL (ISOVUE-370) INJECTION 76% COMPARISON:  None. FINDINGS: Cardiovascular: Normal caliber thoracic aorta. Moderate calcific atherosclerosis. Dense calcified plaque of left subclavian artery origin with moderate to severe stenosis. Normal heart size. No pericardial effusion. Mild coronary artery calcification. Normal size main pulmonary artery. Satisfactory opacification of main pulmonary artery. Respiratory motion artifact in lung bases. No pulmonary embolus identified. Mediastinum/Nodes: No enlarged mediastinal, hilar, or axillary lymph nodes. Thyroid gland, trachea, and esophagus demonstrate no  significant findings. Lungs/Pleura: Pulmonary nodules: 1. Right upper lobe, solid, 12 x 12 mm, series 7: Image 35. 2. Bilateral upper lobe calcified granuloma, 7:22, 50. 3. Left lung apex, scar-like, 20 x 8 mm, 7:5. 4. Left upper lobe, solid, 6 mm, 7:23. Small cluster of nodules in dependent left lower lobe superior segment (series 7, image 48) likely representing minimal bronchiolitis. Severe emphysema. No consolidation, effusion, or pneumothorax. Upper Abdomen: No acute abnormality. Musculoskeletal: No acute fracture. Review of the MIP images confirms the above findings. IMPRESSION: 1. Respiratory motion artifact in lung bases. No pulmonary embolus identified. 2. Severe emphysema. 3. Coronary and aortic calcific atherosclerosis. 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 4. Multiple pulmonary nodules measuring up to 12 mm in the right upper lobe. Non-contrast chest CT at 3-6 months is  recommended. If the nodules are stable at time of repeat CT, then future CT at 18-24 months (from today's scan) is considered optional for low-risk patients, but is recommended for high-risk patients. This recommendation follows the consensus statement: Guidelines for Management of Incidental Pulmonary Nodules Detected on CT Images: From the Fleischner Society 2017; Radiology 2017; 284:228-243. 5. Minimal bronchiolitis and left lower lobe. No consolidation, effusion, pneumothorax. Electronically Signed   By: Kristine Garbe M.D.   On: 01/24/2018 18:41     MICRO DATA: MRSA PCR - pending Resp Influenza A +  ANTIMICROBIALS:  Azithromycin; Tamiflu  Azithromycin   ASSESSMENT/PLAN   1. Acute on chronic hypoxic respiratory failure now on NIPPV 2. Influenza Pneumonia- after hydration CXR findings will be more evident. 3. Hypovolemia 4. Acute exacerbation of COPD  Plans; 1. Continue on BIPAP 2. Continue on ABX, send off sputum cultures if possible to expectorate,  IVF 3. GI & DVT Prophylaxis 4.Bronchodilators,  dose steroids  5. HFO for diet 6.Target glucose monitoring      Overall, patient is critically ill, prognosis is guarded.   I have dedicated a total of 38 minutes of critical care time minus all appropriate exclusions.  Cammie Sickle, M.D.  [facility]

## 2018-01-24 NOTE — ED Notes (Signed)
Pt presents with s/s of COPD excerebration. Symptoms not improving 83% in sub wait. EDP at bedside.

## 2018-01-24 NOTE — H&P (Signed)
History and Physical    Margaret Stevenson OBS:[id number] DOB: [birth year] DOA: 01/24/2018  Referring physician: Dr. Quentin Cornwall PCP: Dion Body, MD  Specialists: none  Chief Complaint: SOB and cough  HPI: Margaret Stevenson is a 72 y.o. female has a past medical history significant for COPD on 3L O2 at home now with 2 day hx of worsening SOB and cough. In ER, pt markedly hypoxic requiring BiPAP. Influenza A positive. CXR shows no obvious pneumonia. She is now admitted. Afebrile. No N/V/D. Denies CP.  Review of Systems: The patient denies anorexia, fever, weight loss,, vision loss, decreased hearing, hoarseness, chest pain, syncope, peripheral edema, balance deficits, hemoptysis, abdominal pain, melena, hematochezia, severe indigestion/heartburn, hematuria, incontinence, genital sores, muscle weakness, suspicious skin lesions, transient blindness, difficulty walking, depression, unusual weight change, abnormal bleeding, enlarged lymph nodes, angioedema, and breast masses.   Past Medical History:  Diagnosis Date  . Arthritis    hands  . Cancer ([place])    melanoma 07/2013  . COPD (chronic obstructive pulmonary disease) ([place])   . Dyspnea   . Jaundice 04/2017   Past Surgical History:  Procedure Laterality Date  . BUNIONECTOMY    . COLONOSCOPY    . COLONOSCOPY WITH PROPOFOL N/A 05/26/2017   Procedure: COLONOSCOPY WITH PROPOFOL;  Surgeon: Lucilla Lame, MD;  Location: [facility];  Service: Endoscopy;  Laterality: N/A;  please do not move appt time  . ERCP N/A 04/25/2017   Procedure: ENDOSCOPIC RETROGRADE CHOLANGIOPANCREATOGRAPHY (ERCP);  Surgeon: Lucilla Lame, MD;  Location[facility] ENDOSCOPY;  Service: Endoscopy;  Laterality: N/A;  . ERCP N/A 09/30/2017   Procedure: ENDOSCOPIC RETROGRADE CHOLANGIOPANCREATOGRAPHY (ERCP) Pancreatic stent removal;  Surgeon: Lucilla Lame, MD;  Location: [facility] ENDOSCOPY;  Service: Endoscopy;  Laterality: N/A;  . POLYPECTOMY  05/26/2017   Procedure:  POLYPECTOMY;  Surgeon: Lucilla Lame, MD;  Location: [facility];  Service: Endoscopy;;  . TONSILLECTOMY     Social History:  reports that she has been smoking cigarettes.  She has a 57.00 pack-year smoking history. she has never used smokeless tobacco. She reports that she drinks about 4.2 oz of alcohol per week. She reports that she does not use drugs.  Allergies  Allergen Reactions  . Penicillins Hives    Has patient had a PCN reaction causing immediate rash, facial/tongue/throat swelling, SOB or lightheadedness with hypotension: no Has patient had a PCN reaction causing severe rash involving mucus membranes or skin necrosis: no Has patient had a PCN reaction that required hospitalization no Has patient had a PCN reaction occurring within the last 10 years: no If all of the above answers are "NO", then may proceed with Cephalosporin use.     Family History  Problem Relation Age of Onset  . Breast cancer Mother 77    Prior to Admission medications   Medication Sig Start Date End Date Taking? Authorizing Provider  albuterol (VENTOLIN HFA) 108 (90 Base) MCG/ACT inhaler Inhale 2 puffs into the lungs every 4 (four) hours as needed for shortness of breath.  08/27/16 01/24/19 Yes [provider]  Fluticasone-Salmeterol (ADVAIR) 250-50 MCG/DOSE AEPB Inhale 1 puff into the lungs 2 (two) times daily.   Yes [provider]  OXYGEN Inhale 3 L into the lungs. 1 L when not active   Yes [provider]  tiotropium (SPIRIVA) 18 MCG inhalation capsule Place 18 mcg into inhaler and inhale at bedtime.  08/27/16 01/24/19 Yes [provider]  TURMERIC PO Take by mouth.   Yes [provider]  Na Sulfate-K Sulfate-Mg Sulf (SUPREP BOWEL PREP KIT) 17.5-3.13-1.6 GM/180ML SOLN Take 1 kit by mouth as directed. Patient not taking: Reported on 01/24/2018 05/08/17   Lucilla Lame, MD   Physical Exam: Vitals:   01/24/18 1308 01/24/18 1309  BP: (!) 145/69   Pulse:  (!) 125   Resp: (!) 22   Temp: 99.5 F (37.5 C)   TempSrc: Oral   SpO2: 92%   Weight:  62.1 kg (137 lb)  Height:  _0  (1.676 m)     General:  WDWN, [place]/AT, in severe  distress  Eyes: PERRL, EOMI, no scleral icterus, conjunctiva clear  ENT: moist oropharynx without exudate, TM's benign, dentition fair  Neck: supple, no lymphadenopathy. No bruits or thyromegaly  Cardiovascular: regular rate without MRG; 2+ peripheral pulses, no JVD, no peripheral edema  Respiratory: decreased breath sounds with expiratory wheezes. No rales or rhonchi. Respiratory effort increased  Abdomen: soft, non tender to palpation, positive bowel sounds, no guarding, no rebound  Skin: no rashes or lesions  Musculoskeletal: normal bulk and tone, no joint swelling  Psychiatric: normal mood and affect, A&OX3  Neurologic: CN 2-12 grossly intact, Motor strength 5/5 in all 4 groups with symmetric DTR's and non-focal sensory exam  Labs on Admission:  Basic Metabolic Panel: Recent Labs  Lab 01/24/18 1310  NA 134*  K 4.1  CL 98*  CO2 25  GLUCOSE 129*  BUN 9  CREATININE 0.72  CALCIUM 9.0   Liver Function Tests: No results for input(s): AST, ALT, ALKPHOS, BILITOT, PROT, ALBUMIN in the last 168 hours. No results for input(s): LIPASE, AMYLASE in the last 168 hours. No results for input(s): AMMONIA in the last 168 hours. CBC: Recent Labs  Lab 01/24/18 1310  WBC 8.5  HGB 15.7  HCT 47.3*  MCV 94.8  PLT 225   Cardiac Enzymes: Recent Labs  Lab 01/24/18 1310  TROPONINI <0.03    BNP (last 3 results) No results for input(s): BNP in the last 8760 hours.  ProBNP (last 3 results) No results for input(s): PROBNP in the last 8760 hours.  CBG: No results for input(s): GLUCAP in the last 168 hours.  Radiological Exams on Admission: Dg Chest 2 View  Result Date: 01/24/2018 CLINICAL DATA:  Shortness of breath. EXAM: CHEST  2 VIEW COMPARISON:  Apr 27, 2017 FINDINGS: Mild scarring in the right base  is stable. Mild atelectasis on the left. The heart, hila, and mediastinum are normal. No pulmonary nodules or masses. Hyperinflation of lungs with flattening of the diaphragms. No other acute abnormalities. IMPRESSION: Hyperinflation of lungs with flattening of the diaphragms. Scarring in the right base, unchanged. No acute abnormalities. Electronically Signed   By: Dorise Bullion III M.D   On: 01/24/2018 13:50    EKG: Independently reviewed.  Assessment/Plan Principal Problem:   Acute respiratory failure (HCC) Active Problems:   COPD (chronic obstructive pulmonary disease) (HCC)   Tobacco dependence   Influenza A   Will admit to ICU on BiPAP. Begin IV fluids, IV steroids, IV ABX, SVN's and Tamiflu. Consult the ICU team. Wean off BiPAP as tolerated. Repeat labs in AM  Diet: clear liquids Fluids: NS_1  DVT Prophylaxis: Lovenox  Code Status: FULL  Family Communication: none  Disposition Plan: home  Time spent: 50 min

## 2018-01-24 NOTE — ED Notes (Signed)
Pt on BiPap at this time. Pt is tolerating it well. RN will monitor.

## 2018-01-24 NOTE — ED Triage Notes (Signed)
Pt arrives to ED on her chronic 3 L for COPD. Pt was 89% on 3L. Increased to 4 L in triage and at 91%.  States when she was at home it was dropping with movement. Has on pulse ox on. Alert, oriented. In wheelchair. States SOB with movement.

## 2018-01-24 NOTE — ED Notes (Signed)
Report called to Hiral RN in ICU. VSS NAD, Pt to to CT before Floor. RN will monitor.

## 2018-01-24 NOTE — ED Provider Notes (Signed)
[facility] Emergency Department Provider Note    None    (approximate)  I have reviewed the triage vital signs and the nursing notes.   HISTORY  Chief Complaint COPD    HPI Margaret Stevenson is a 72 y.o. female with a history of COPD on 3 L nasal cannula chronically at home presents to the ER with worsening shortness of breath and significant fatigue and dyspnea with any exertion.  States this became significantly worse today.  Has been having a nonproductive cough for the past 48 hours and her son who lives at home with her was sick with a viral illness last week which she has since gotten over.  She denies any chest pain but is having trouble catching her breath.  With any movement she will desaturate down to the low 70s.  In triage she was increased to 6 L nasal cannula which was able to keep her O2 saturations at 92%.  She is speaking in short phrases and states that she is struggling to breathe.  Past Medical History:  Diagnosis Date  . Arthritis    hands  . Cancer ([place])    melanoma 07/2013  . COPD (chronic obstructive pulmonary disease) ([place])   . Dyspnea   . Jaundice 04/2017   Family History  Problem Relation Age of Onset  . Breast cancer Mother 61   Past Surgical History:  Procedure Laterality Date  . BUNIONECTOMY    . COLONOSCOPY    . COLONOSCOPY WITH PROPOFOL N/A 05/26/2017   Procedure: COLONOSCOPY WITH PROPOFOL;  Surgeon: Lucilla Lame, MD;  Location: [place];  Service: Endoscopy;  Laterality: N/A;  please do not move appt time  . ERCP N/A 04/25/2017   Procedure: ENDOSCOPIC RETROGRADE CHOLANGIOPANCREATOGRAPHY (ERCP);  Surgeon: Lucilla Lame, MD;  Location: [facility] ENDOSCOPY;  Service: Endoscopy;  Laterality: N/A;  . ERCP N/A 09/30/2017   Procedure: ENDOSCOPIC RETROGRADE CHOLANGIOPANCREATOGRAPHY (ERCP) Pancreatic stent removal;  Surgeon: Lucilla Lame, MD;  Location: [facility] ENDOSCOPY;  Service: Endoscopy;  Laterality: N/A;  . POLYPECTOMY   05/26/2017   Procedure: POLYPECTOMY;  Surgeon: Lucilla Lame, MD;  Location: [place];  Service: Endoscopy;;  . TONSILLECTOMY     Patient Active Problem List   Diagnosis Date Noted  . Fitting and adjustment of gastrointestinal appliance and device   . Hx of colonic polyps   . Benign neoplasm of cecum   . Benign neoplasm of ascending colon   . Benign neoplasm of transverse colon   . Cancer ([place]) 05/08/2017  . 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 Elevated CA 19-9 level 05/08/2017  . Arthritis 05/07/2017  . Asthma without status asthmaticus 05/07/2017  . Borderline diabetes 05/07/2017  . Hx of adenomatous colonic polyps 05/07/2017  . Tobacco dependence 05/07/2017  . Sepsis ([place]) 04/27/2017  . Disease of biliary tract   . Obstruction of bile duct   . Jaundice   . Hypoxia 10/11/2016  . Anxiety 10/11/2016  . Acute respiratory failure ([place]) 10/06/2016  . COPD (chronic obstructive pulmonary disease) ([place]) 10/06/2016  . CAP (community acquired pneumonia) 10/06/2016  . Hyperglycemia 10/06/2016  . Vaccine counseling 06/21/2016  . History of malignant melanoma of skin 08/03/2013      Prior to Admission medications   Medication Sig Start Date End Date Taking? Authorizing Provider  albuterol (VENTOLIN HFA) 108 (90 Base) MCG/ACT inhaler Inhale 2 puffs into the lungs every 4 (four) hours as needed for shortness of breath.  08/27/16 01/24/19 Yes [provider]  Fluticasone-Salmeterol (ADVAIR) 250-50 MCG/DOSE AEPB Inhale 1 puff into the lungs 2 (two) times daily.   Yes [provider]  OXYGEN Inhale 3 L into the lungs. 1 L when not active   Yes [provider]  tiotropium (SPIRIVA) 18 MCG inhalation capsule Place 18 mcg into inhaler and inhale at bedtime.  08/27/16 01/24/19 Yes [provider]  TURMERIC PO Take by mouth.   Yes [provider]  Na Sulfate-K Sulfate-Mg Sulf (SUPREP BOWEL PREP KIT) 17.5-3.13-1.6 GM/180ML SOLN Take 1 kit by  mouth as directed. Patient not taking: Reported on 01/24/2018 05/08/17   Lucilla Lame, MD    Allergies Penicillins    Social History Social History   Tobacco Use  . Smoking status: Current Every Day Smoker    Packs/day: 1.00    Years: 57.00    Pack years: 57.00    Types: Cigarettes  . Smokeless tobacco: Never Used  Substance Use Topics  . Alcohol use: Yes    Alcohol/week: 4.2 oz    Types: 7 Glasses of wine per week  . Drug use: No    Review of Systems Patient denies headaches, rhinorrhea, blurry vision, numbness, shortness of breath, chest pain, edema, cough, abdominal pain, nausea, vomiting, diarrhea, dysuria, fevers, rashes or hallucinations unless otherwise stated above in HPI. ____________________________________________   PHYSICAL EXAM:  VITAL SIGNS: Vitals:   01/24/18 1308  BP: (!) 145/69  Pulse: (!) 125  Resp: (!) 22  Temp: 99.5 F (37.5 C)  SpO2: 92%    Constitutional: Alert and oriented.  Critically ill-appearing in moderate respiratory distress  eyes: Conjunctivae are normal.  Head: Atraumatic. Nose: No congestion/rhinnorhea. Mouth/Throat: Mucous membranes are moist.   Neck: No stridor. Painless ROM.  Cardiovascular: Tachycardic rate regular rhythm. Grossly normal heart sounds.  Good peripheral circulation. Respiratory: Tachypnea with prolonged expiratory phase, speaking in 2 word phrases use of accessory muscles and diminished breath sounds throughout  gastrointestinal: Soft and nontender. No distention. No abdominal bruits. No CVA tenderness. Genitourinary:  Musculoskeletal: No lower extremity tenderness nor edema.  No joint effusions. Neurologic:  Normal speech and language. No gross focal neurologic deficits are appreciated. No facial droop Skin:  Skin is warm, dry and intact. No rash noted. Psychiatric: Mood and affect are normal. Speech and behavior are normal.  ____________________________________________   LABS (all labs ordered are  listed, but only abnormal results are displayed)  Results for orders placed or performed during the hospital encounter of 01/24/18 (from the past 24 hour(s))  Basic metabolic panel     Status: Abnormal   Collection Time: 01/24/18  1:10 PM  Result Value Ref Range   Sodium 134 (L) 135 - 145 mmol/L   Potassium 4.1 3.5 - 5.1 mmol/L   Chloride 98 (L) 101 - 111 mmol/L   CO2 25 22 - 32 mmol/L   Glucose, Bld 129 (H) 65 - 99 mg/dL   BUN 9 6 - 20 mg/dL   Creatinine, Ser 0.72 0.44 - 1.00 mg/dL   Calcium 9.0 8.9 - 10.3 mg/dL   GFR calc non Af Amer >60 >60 mL/min   GFR calc Af Amer >60 >60 mL/min   Anion gap 11 5 - 15  CBC     Status: Abnormal   Collection Time: 01/24/18  1:10 PM  Result Value Ref Range   WBC 8.5 3.6 - 11.0 K/uL   RBC 4.98 3.80 - 5.20 MIL/uL   Hemoglobin 15.7 12.0 - 16.0 g/dL   HCT 47.3 (H) 35.0 - 47.0 %  MCV 94.8 80.0 - 100.0 fL   MCH 31.4 26.0 - 34.0 pg   MCHC 33.1 32.0 - 36.0 g/dL   RDW 14.7 (H) 11.5 - 14.5 %   Platelets 225 150 - 440 K/uL  Troponin I     Status: None   Collection Time: 01/24/18  1:10 PM  Result Value Ref Range   Troponin I <0.03 <0.03 ng/mL  Influenza panel by PCR (type A & B)     Status: Abnormal   Collection Time: 01/24/18  3:33 PM  Result Value Ref Range   Influenza A By PCR POSITIVE (A) NEGATIVE   Influenza B By PCR NEGATIVE NEGATIVE   ____________________________________________  EKG My review and personal interpretation at Time: 13:15   Indication: resp distress  Rate: 125  Rhythm: sinus Axis: right Other: normal intervals, BAE, no stemi, nonspecific st changes likely rate dependent ____________________________________________  RADIOLOGY  I personally reviewed all radiographic images ordered to evaluate for the above acute complaints and reviewed radiology reports and findings.  These findings were personally discussed with the patient.  Please see medical record for radiology  report.  ____________________________________________   PROCEDURES  Procedure(s) performed:  .Critical Care Performed by: Merlyn Lot, MD Authorized by: Merlyn Lot, MD   Critical care provider statement:    Critical care time (minutes):  35   Critical care time was exclusive of:  Separately billable procedures and treating other patients   Critical care was necessary to treat or prevent imminent or life-threatening deterioration of the following conditions:  Respiratory failure   Critical care was time spent personally by me on the following activities:  Development of treatment plan with patient or surrogate, discussions with consultants, evaluation of patient's response to treatment, examination of patient, obtaining history from patient or surrogate, ordering and performing treatments and interventions, ordering and review of laboratory studies, ordering and review of radiographic studies, pulse oximetry, re-evaluation of patient's condition and review of old charts      Critical Care performed: yes ____________________________________________   INITIAL IMPRESSION / [place] / ED COURSE  Pertinent labs & imaging results that were available during my care of the patient were reviewed by me and considered in my medical decision making (see chart for details).  DDX: Asthma, copd, CHF, pna, ptx, malignancy, Pe, anemia   Scotlynn Noyes is a 72 y.o. who presents to the ED with symptoms as described above.  Patient in acute respiratory distress.  Based on her work of breathing and acute hypoxia patient placed on BiPAP for respiratory support.  Chest x-ray shows no evidence of edema or pneumonia.  Seems less consistent with CHF or PE.  Will give nebulizer treatments, steroids and check for flu.  Will provide IV fluids for tachycardia.  Clinical Course as of Jan 24 1641  Sat Jan 24, 2018  1639 Vision is flu a positive.  Will give Tamiflu.  Continue with IV  fluids for resuscitation.  Troponin is negative.  Not consistent with pneumonia more consistent with her COPD exacerbation with subsequent acute on chronic hypoxia and respiratory failure.  Patient will be kept on BiPAP and I do feel patient will require admission the hospital for further medical management.  Have discussed with the patient and available family all diagnostics and treatments performed thus far and all questions were answered to the best of my ability. The patient demonstrates understanding and agreement with plan.   [PR]    Clinical Course User Index [PR] Merlyn Lot, MD  ____________________________________________   FINAL CLINICAL IMPRESSION(S) / ED DIAGNOSES  Final diagnoses:  Acute on chronic respiratory failure with hypoxemia (HCC)  COPD with acute exacerbation (HCC)  Influenza A      NEW MEDICATIONS STARTED DURING THIS VISIT:  New Prescriptions   No medications on file     Note:  This document was prepared using Dragon voice recognition software and may include unintentional dictation errors.    Merlyn Lot, MD 01/24/18 [phone]

## 2018-01-25 ENCOUNTER — Encounter: Payer: Self-pay | Admitting: *Deleted

## 2018-01-25 ENCOUNTER — Other Ambulatory Visit: Payer: Self-pay

## 2018-01-25 DIAGNOSIS — J101 Influenza due to other identified influenza virus with other respiratory manifestations: Secondary | ICD-10-CM

## 2018-01-25 DIAGNOSIS — R911 Solitary pulmonary nodule: Secondary | ICD-10-CM

## 2018-01-25 LAB — GLUCOSE, CAPILLARY
GLUCOSE-CAPILLARY: 126 mg/dL — AB (ref 65–99)
GLUCOSE-CAPILLARY: 141 mg/dL — AB (ref 65–99)
GLUCOSE-CAPILLARY: 164 mg/dL — AB (ref 65–99)
GLUCOSE-CAPILLARY: 168 mg/dL — AB (ref 65–99)
GLUCOSE-CAPILLARY: 227 mg/dL — AB (ref 65–99)
Glucose-Capillary: 155 mg/dL — ABNORMAL HIGH (ref 65–99)

## 2018-01-25 LAB — BASIC METABOLIC PANEL
Anion gap: 6 (ref 5–15)
BUN: 9 mg/dL (ref 6–20)
CALCIUM: 8.4 mg/dL — AB (ref 8.9–10.3)
CHLORIDE: 101 mmol/L (ref 101–111)
CO2: 27 mmol/L (ref 22–32)
CREATININE: 0.51 mg/dL (ref 0.44–1.00)
GFR calc Af Amer: >60 mL/min (ref >60)
GFR calc non Af Amer: >60 mL/min (ref >60)
GLUCOSE: 138 mg/dL — AB (ref 65–99)
Potassium: 4.1 mmol/L (ref 3.5–5.1)
Sodium: 134 mmol/L — ABNORMAL LOW (ref 135–145)

## 2018-01-25 LAB — COMPREHENSIVE METABOLIC PANEL
ALK PHOS: 49 U/L (ref 38–126)
ALT: 23 U/L (ref 14–54)
ANION GAP: 7 (ref 5–15)
AST: 24 U/L (ref 15–41)
Albumin: 3.6 g/dL (ref 3.5–5.0)
BILIRUBIN TOTAL: 0.7 mg/dL (ref 0.3–1.2)
BUN: 12 mg/dL (ref 6–20)
CALCIUM: 8.4 mg/dL — AB (ref 8.9–10.3)
CO2: 26 mmol/L (ref 22–32)
Chloride: 104 mmol/L (ref 101–111)
Creatinine, Ser: 0.6 mg/dL (ref 0.44–1.00)
GFR calc non Af Amer: >60 mL/min (ref >60)
Glucose, Bld: 132 mg/dL — ABNORMAL HIGH (ref 65–99)
Potassium: 4.1 mmol/L (ref 3.5–5.1)
SODIUM: 137 mmol/L (ref 135–145)
Total Protein: 6.4 g/dL — ABNORMAL LOW (ref 6.5–8.1)

## 2018-01-25 LAB — CBC
HCT: 42.6 % (ref 35.0–47.0)
HEMOGLOBIN: 14.1 g/dL (ref 12.0–16.0)
MCH: 31.7 pg (ref 26.0–34.0)
MCHC: 33.1 g/dL (ref 32.0–36.0)
MCV: 95.9 fL (ref 80.0–100.0)
Platelets: 208 10*3/uL (ref 150–440)
RBC: 4.44 MIL/uL (ref 3.80–5.20)
RDW: 14.9 % — ABNORMAL HIGH (ref 11.5–14.5)
WBC: 4.9 10*3/uL (ref 3.6–11.0)

## 2018-01-25 LAB — PHOSPHORUS: Phosphorus: 4 mg/dL (ref 2.5–4.6)

## 2018-01-25 LAB — MAGNESIUM: Magnesium: 1.9 mg/dL (ref 1.7–2.4)

## 2018-01-25 MED ORDER — INSULIN ASPART 100 UNIT/ML ~~LOC~~ SOLN
0.0000 [IU] | Freq: Three times a day (TID) | SUBCUTANEOUS | Status: DC
  Administered 2018-01-26 (×2): 2 [IU] via SUBCUTANEOUS
  Administered 2018-01-27: 1 [IU] via SUBCUTANEOUS
  Filled 2018-01-25 (×3): qty 1

## 2018-01-25 MED ORDER — METHYLPREDNISOLONE SODIUM SUCC 40 MG IJ SOLR
40.0000 mg | Freq: Four times a day (QID) | INTRAMUSCULAR | Status: DC
  Administered 2018-01-25 – 2018-01-26 (×4): 40 mg via INTRAVENOUS
  Filled 2018-01-25 (×4): qty 1

## 2018-01-25 NOTE — Plan of Care (Signed)
Pt tolerating BiPap well.  Maintaining adequate SaO2 throughout the night.  Assisted up to [facility] on 6L O2 [place] with resulting SOB once returning back to bed.  Recovered quickly once back on BiPap.

## 2018-01-25 NOTE — Progress Notes (Signed)
I called lab to check on status of MRSA PCR.  Lab is having issues being able to post result to this patient's chart.  Lab tech gave me a verbal report that the patient's MRSA PCR was in fact negative.

## 2018-01-25 NOTE — Progress Notes (Signed)
[facility] [place] NAME: Margaret Stevenson    MR#:  [id number]  DATE OF BIRTH:  1946/07/28  SUBJECTIVE:  CHIEF COMPLAINT:   Chief Complaint  Patient presents with  . COPD    Came with worsening respiratory failure and noted to have influenza yesterday. Requiring BiPAP initially on admission, today morning improved and on nasal cannula 5-6 L. Feels better, very argumentative in each and every discussions.  REVIEW OF SYSTEMS:  CONSTITUTIONAL: No fever, fatigue or weakness.  EYES: No blurred or double vision.  EARS, NOSE, AND THROAT: No tinnitus or ear pain.  RESPIRATORY: Have cough, shortness of breath, wheezing , no hemoptysis.  CARDIOVASCULAR: No chest pain, orthopnea, edema.  GASTROINTESTINAL: No nausea, vomiting, diarrhea or abdominal pain.  GENITOURINARY: No dysuria, hematuria.  ENDOCRINE: No polyuria, nocturia,  HEMATOLOGY: No anemia, easy bruising or bleeding SKIN: No rash or lesion. MUSCULOSKELETAL: No joint pain or arthritis.   NEUROLOGIC: No tingling, numbness, weakness.  PSYCHIATRY: No anxiety or depression.   ROS  DRUG ALLERGIES:   Allergies  Allergen Reactions  . Penicillins Hives    Has patient had a PCN reaction causing immediate rash, facial/tongue/throat swelling, SOB or lightheadedness with hypotension: no Has patient had a PCN reaction causing severe rash involving mucus membranes or skin necrosis: no Has patient had a PCN reaction that required hospitalization no Has patient had a PCN reaction occurring within the last 10 years: no If all of the above answers are "NO", then may proceed with Cephalosporin use.     VITALS:  Blood pressure 110/85, pulse 100, temperature 98.1 F (36.7 C), temperature source Axillary, resp. rate (!) 21, height 5\' 6"  (1.676 m), weight 61.6 kg (135 lb 12.9 oz), SpO2 92 %.  PHYSICAL EXAMINATION:  GENERAL:  72 y.o.-year-old patient lying in the bed with no acute distress.  EYES:  Pupils equal, round, reactive to light and accommodation. No scleral icterus. Extraocular muscles intact.  HEENT: Head atraumatic, normocephalic. Oropharynx and nasopharynx clear.  NECK:  Supple, no jugular venous distention. No thyroid enlargement, no tenderness.  LUNGS: Normal breath sounds bilaterally, no wheezing, no crepitation. No use of accessory muscles of respiration. On oxygen via cannula. CARDIOVASCULAR: S1, S2 normal. No murmurs, rubs, or gallops.  ABDOMEN: Soft, nontender, nondistended. Bowel sounds present. No organomegaly or mass.  EXTREMITIES: No pedal edema, cyanosis, or clubbing.  NEUROLOGIC: Cranial nerves II through XII are intact. Muscle strength 5/5 in all extremities. Sensation intact. Gait not checked.  PSYCHIATRIC: The patient is alert and oriented x 3.  SKIN: No obvious rash, lesion, or ulcer.   Physical Exam LABORATORY PANEL:   CBC Recent Labs  Lab 01/25/18 0552  WBC 4.9  HGB 14.1  HCT 42.6  PLT 208   ------------------------------------------------------------------------------------------------------------------  Chemistries  Recent Labs  Lab 01/24/18 2200 01/25/18 0552  NA 134* 137  K 4.1 4.1  CL 101 104  CO2 27 26  GLUCOSE 138* 132*  BUN 9 12  CREATININE 0.51 0.60  CALCIUM 8.4* 8.4*  MG 1.9  --   AST  --  24  ALT  --  23  ALKPHOS  --  49  BILITOT  --  0.7   ------------------------------------------------------------------------------------------------------------------  Cardiac Enzymes Recent Labs  Lab 01/24/18 1310  TROPONINI <0.03   ------------------------------------------------------------------------------------------------------------------  RADIOLOGY:  Dg Chest 2 View  Result Date: 01/24/2018 CLINICAL DATA:  Shortness of breath. EXAM: CHEST  2 VIEW COMPARISON:  Apr 27, 2017 FINDINGS: Mild scarring in  the right base is stable. Mild atelectasis on the left. The heart, hila, and mediastinum are normal. No pulmonary nodules  or masses. Hyperinflation of lungs with flattening of the diaphragms. No other acute abnormalities. IMPRESSION: Hyperinflation of lungs with flattening of the diaphragms. Scarring in the right base, unchanged. No acute abnormalities. Electronically Signed   By: Dorise Bullion III M.D   On: 01/24/2018 13:50   Ct Angio Chest Pe W Or Wo Contrast  Result Date: 01/24/2018 CLINICAL DATA:  72 y/o F; 2 days of worsening shortness of breath and cough. History of COPD. Influenza a positive. EXAM: CT ANGIOGRAPHY CHEST WITH CONTRAST TECHNIQUE: Multidetector CT imaging of the chest was performed using the standard protocol during bolus administration of intravenous contrast. Multiplanar CT image reconstructions and MIPs were obtained to evaluate the vascular anatomy. CONTRAST:  60mL ISOVUE-370 IOPAMIDOL (ISOVUE-370) INJECTION 76% COMPARISON:  None. FINDINGS: Cardiovascular: Normal caliber thoracic aorta. Moderate calcific atherosclerosis. Dense calcified plaque of left subclavian artery origin with moderate to severe stenosis. Normal heart size. No pericardial effusion. Mild coronary artery calcification. Normal size main pulmonary artery. Satisfactory opacification of main pulmonary artery. Respiratory motion artifact in lung bases. No pulmonary embolus identified. Mediastinum/Nodes: No enlarged mediastinal, hilar, or axillary lymph nodes. Thyroid gland, trachea, and esophagus demonstrate no significant findings. Lungs/Pleura: Pulmonary nodules: 1. Right upper lobe, solid, 12 x 12 mm, series 7: Image 35. 2. Bilateral upper lobe calcified granuloma, 7:22, 50. 3. Left lung apex, scar-like, 20 x 8 mm, 7:5. 4. Left upper lobe, solid, 6 mm, 7:23. Small cluster of nodules in dependent left lower lobe superior segment (series 7, image 48) likely representing minimal bronchiolitis. Severe emphysema. No consolidation, effusion, or pneumothorax. Upper Abdomen: No acute abnormality. Musculoskeletal: No acute fracture. Review of the  MIP images confirms the above findings. IMPRESSION: 1. Respiratory motion artifact in lung bases. No pulmonary embolus identified. 2. Severe emphysema. 3. Coronary and aortic calcific atherosclerosis. 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 4. Multiple pulmonary nodules measuring up to 12 mm in the right upper lobe. Non-contrast chest CT at 3-6 months is recommended. If the nodules are stable at time of repeat CT, then future CT at 18-24 months (from today's scan) is considered optional for low-risk patients, but is recommended for high-risk patients. This recommendation follows the consensus statement: Guidelines for Management of Incidental Pulmonary Nodules Detected on CT Images: From the Fleischner Society 2017; Radiology 2017; 284:228-243. 5. Minimal bronchiolitis and left lower lobe. No consolidation, effusion, pneumothorax. Electronically Signed   By: Kristine Garbe M.D.   On: 01/24/2018 18:41    ASSESSMENT AND PLAN:   Principal Problem:   Acute respiratory failure (HCC) Active Problems:   COPD (chronic obstructive pulmonary disease) (HCC)   Tobacco dependence   Influenza A  * Acute respiratory failure on chronic   Chronically on 3 L oxygen at home but   Initially required BiPAP, back on nasal cannula, try to taper up to 3 L.  * COPD exacerbation   Influenza A infection   Tamiflu.   IV solumedrol. Nebs, Spiriva.  * Active smoking   Counseled to quit smoking for 4 minutes and offered nicotine patch.    All the records are reviewed and case discussed with Care Management/Social Workerr. Management plans discussed with the patient, family and they are in agreement.  CODE STATUS: Full.  TOTAL TIME TAKING CARE OF THIS PATIENT: 35 minutes.   Son in room. Discussed with ICU physician.  POSSIBLE  D/C IN 1-2 DAYS, DEPENDING ON CLINICAL CONDITION.   Vaughan Basta M.D on 01/25/2018   Between 7am to 6pm - Pager -  [phone]  After 6pm go to [email or web address] - password [email or web address] [facility]  Office  [phone]  CC: Primary care physician; Dion Body, MD  Note: This dictation was prepared with Dragon dictation along with smaller phrase technology. Any transcriptional errors that result from this process are unintentional.

## 2018-01-25 NOTE — Progress Notes (Signed)
PULMONARY / CRITICAL CARE MEDICINE   Name: Margaret Stevenson MRN: [id number] DOB: 12/23/1945    ADMISSION DATE:  01/24/2018   HISTORY OF PRESENT ILLNESS:   MS Margaret Andress Milliganis a 72 y.o.femalehas a past medical history significant forCOPD on 3L O2 at home.  She presented  with 2 day hx of worsening SOB and cough. In ER, pt markedly hypoxic requiring BiPAP. Influenza A positive. CXR shows no obvious pneumonia. She is now admitted. Afebrile. No N/V/D. Denies CP.  She reported non productive cough and very poor appetite.  Patient has had no acute events in the night.   REVIEW OF SYSTEMS:   NAD  SUBJECTIVE:  Patient reports that she feels better.  VITAL SIGNS: BP (!) 117/56   Pulse 91   Temp 98.1 F (36.7 C) (Axillary)   Resp (!) 22   Ht 5\' 6"  (1.676 m)   Wt 135 lb 12.9 oz (61.6 kg)   SpO2 94%   BMI 21.92 kg/m   HEMODYNAMICS:  no compromise  VENTILATOR SETTINGS: FiO2 (%):  [40 %] 40 % BIPAP Off BIPAP on 6 liters Oxygen  INTAKE / OUTPUT: I/O last 3 completed shifts: In: 1343.3 [P.O.:120; I.V.:973.3; IV Piggyback:250] Out: 250 [Urine:250]  PHYSICAL EXAMINATION: General: Comfortable, no distress Neuro:  Awake, alert X$, no deficits HEENT:  PERRL, moist oral cavity Cardiovascular:  RRR, no oedema Lungs:  Diffuse wheezing this Am, now clear Abdomen:  Soft NT, +BS Musculoskeletal:  No deformities Skin:  Warm and dry  LABS:  BMET Recent Labs  Lab 01/24/18 1310 01/24/18 2200 01/25/18 0552  NA 134* 134* 137  K 4.1 4.1 4.1  CL 98* 101 104  CO2 25 27 26   BUN 9 9 12   CREATININE 0.72 0.51 0.60  GLUCOSE 129* 138* 132*    Electrolytes Recent Labs  Lab 01/24/18 1310 01/24/18 2200 01/25/18 0552  CALCIUM 9.0 8.4* 8.4*  MG  --  1.9  --   PHOS  --  4.0  --     CBC Recent Labs  Lab 01/24/18 1310 01/25/18 0552  WBC 8.5 4.9  HGB 15.7 14.1  HCT 47.3* 42.6  PLT 225 208    Coag's No results for input(s): APTT, INR in the last 168 hours.  Sepsis  Markers No results for input(s): LATICACIDVEN, PROCALCITON, O2SATVEN in the last 168 hours.  ABG No results for input(s): PHART, PCO2ART, PO2ART in the last 168 hours.  Liver Enzymes Recent Labs  Lab 01/25/18 0552  AST 24  ALT 23  ALKPHOS 49  BILITOT 0.7  ALBUMIN 3.6    Cardiac Enzymes Recent Labs  Lab 01/24/18 1310  TROPONINI <0.03    Glucose Recent Labs  Lab 01/24/18 1830 01/24/18 2020 01/25/18 0034 01/25/18 0425 01/25/18 0720  GLUCAP 112* 151* 141* 155* 126*    Imaging Ct Angio Chest Pe W Or Wo Contrast  Result Date: 01/24/2018 CLINICAL DATA:  72 y/o F; 2 days of worsening shortness of breath and cough. History of COPD. Influenza a positive. EXAM: CT ANGIOGRAPHY CHEST WITH CONTRAST TECHNIQUE: Multidetector CT imaging of the chest was performed using the standard protocol during bolus administration of intravenous contrast. Multiplanar CT image reconstructions and MIPs were obtained to evaluate the vascular anatomy. CONTRAST:  72mL ISOVUE-370 IOPAMIDOL (ISOVUE-370) INJECTION 76% COMPARISON:  None. FINDINGS: Cardiovascular: Normal caliber thoracic aorta. Moderate calcific atherosclerosis. Dense calcified plaque of left subclavian artery origin with moderate to severe stenosis. Normal heart size. No pericardial effusion. Mild coronary artery calcification. Normal size  main pulmonary artery. Satisfactory opacification of main pulmonary artery. Respiratory motion artifact in lung bases. No pulmonary embolus identified. Mediastinum/Nodes: No enlarged mediastinal, hilar, or axillary lymph nodes. Thyroid gland, trachea, and esophagus demonstrate no significant findings. Lungs/Pleura: Pulmonary nodules: 1. Right upper lobe, solid, 12 x 12 mm, series 7: Image 35. 2. Bilateral upper lobe calcified granuloma, 7:22, 50. 3. Left lung apex, scar-like, 20 x 8 mm, 7:5. 4. Left upper lobe, solid, 6 mm, 7:23. Small cluster of nodules in dependent left lower lobe superior segment (series 7,  image 48) likely representing minimal bronchiolitis. Severe emphysema. No consolidation, effusion, or pneumothorax. Upper Abdomen: No acute abnormality. Musculoskeletal: No acute fracture. Review of the MIP images confirms the above findings. IMPRESSION: 1. Respiratory motion artifact in lung bases. No pulmonary embolus identified. 2. Severe emphysema. 3. Coronary and aortic calcific atherosclerosis. 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 4. Multiple pulmonary nodules measuring up to 12 mm in the right upper lobe. Non-contrast chest CT at 3-6 months is recommended. If the nodules are stable at time of repeat CT, then future CT at 18-24 months (from today's scan) is considered optional for low-risk patients, but is recommended for high-risk patients. This recommendation follows the consensus statement: Guidelines for Management of Incidental Pulmonary Nodules Detected on CT Images: From the Fleischner Society 2017; Radiology 2017; 284:228-243. 5. Minimal bronchiolitis and left lower lobe. No consolidation, effusion, pneumothorax. Electronically Signed   By: Kristine Garbe M.D.   On: 01/24/2018 18:41     STUDIES:  2/16 CTA Chest  CULTURES: 2/16 Urine- pending  ANTIBIOTICS: 2/16 Azithromycin  SIGNIFICANT EVENTS: 2/16 Admit to ICU  LINES/TUBES: peripheral  DISCUSSION: This 72 year old lady with Hx of COPD on home Oxygen of 3 liters and was still smoking up to 2 days prior to admission presented with dysnoea and wheezing.  ASSESSMENT / PLAN:  1. Acute on chronic hypoxic respiratory failure now off BIPAP 2. Influenza Pneumonitis- no pyrexia or leukocytosis 3. Hypovolemia has resolved 4. Acute exacerbation of COPD 5. Multiple right upper Lung nodules 6. Active on going nicotine dependency  Plans; 1. Liberated from BIPAP, wean Oxygen as able 2. Continue on ABX for COPD exacerbation 3. GI & DVT Prophylaxis 4.Bronchodilators,  dose  steroids  5. Out of bed as able 6.Target glucose monitoring 7. Will need to be Scheduled repeat CT chest for lung nodule follow up at 3-6 months post discharge.   Disp: May transfer out of ICU   FAMILY  - Updates: when availabel    Cammie Sickle, M.D Pulmonary and [place] Pager: [phone]  01/25/2018, 2:02 PM

## 2018-01-26 DIAGNOSIS — F17200 Nicotine dependence, unspecified, uncomplicated: Secondary | ICD-10-CM

## 2018-01-26 LAB — GLUCOSE, CAPILLARY
GLUCOSE-CAPILLARY: 113 mg/dL — AB (ref 65–99)
GLUCOSE-CAPILLARY: 103 mg/dL — AB (ref 65–99)
Glucose-Capillary: 163 mg/dL — ABNORMAL HIGH (ref 65–99)
Glucose-Capillary: 200 mg/dL — ABNORMAL HIGH (ref 65–99)

## 2018-01-26 LAB — MRSA PCR SCREENING: MRSA BY PCR: NEGATIVE

## 2018-01-26 MED ORDER — METHYLPREDNISOLONE SODIUM SUCC 40 MG IJ SOLR
40.0000 mg | Freq: Two times a day (BID) | INTRAMUSCULAR | Status: DC
  Administered 2018-01-26: 40 mg via INTRAVENOUS
  Filled 2018-01-26: qty 1

## 2018-01-26 MED ORDER — ALBUTEROL SULFATE (2.5 MG/3ML) 0.083% IN NEBU: 2.5000 mg | INHALATION_SOLUTION | RESPIRATORY_TRACT | Status: DC | PRN

## 2018-01-26 MED ORDER — IPRATROPIUM-ALBUTEROL 0.5-2.5 (3) MG/3ML IN SOLN
3.0000 mL | Freq: Four times a day (QID) | RESPIRATORY_TRACT | Status: DC
  Administered 2018-01-26 – 2018-01-27 (×3): 3 mL via RESPIRATORY_TRACT
  Filled 2018-01-26 (×2): qty 3

## 2018-01-26 NOTE — Progress Notes (Signed)
[facility] [place] NAME: Margaret Stevenson    MR#:  [id number]  DATE OF BIRTH:  03/09/1946  SUBJECTIVE:  CHIEF COMPLAINT:   Chief Complaint  Patient presents with  . COPD    Came with worsening respiratory failure and noted to have influenza yesterday. Requiring BiPAP initially on admission, today morning improved and on nasal cannula 4 L. Feels better,  argumentative in each and every discussions. Have generalized weakness.  REVIEW OF SYSTEMS:  CONSTITUTIONAL: No fever, fatigue or weakness.  EYES: No blurred or double vision.  EARS, NOSE, AND THROAT: No tinnitus or ear pain.  RESPIRATORY: Have cough, shortness of breath, wheezing , no hemoptysis.  CARDIOVASCULAR: No chest pain, orthopnea, edema.  GASTROINTESTINAL: No nausea, vomiting, diarrhea or abdominal pain.  GENITOURINARY: No dysuria, hematuria.  ENDOCRINE: No polyuria, nocturia,  HEMATOLOGY: No anemia, easy bruising or bleeding SKIN: No rash or lesion. MUSCULOSKELETAL: No joint pain or arthritis.   NEUROLOGIC: No tingling, numbness, weakness.  PSYCHIATRY: No anxiety or depression.   ROS  DRUG ALLERGIES:   Allergies  Allergen Reactions  . Penicillins Hives    Has patient had a PCN reaction causing immediate rash, facial/tongue/throat swelling, SOB or lightheadedness with hypotension: no Has patient had a PCN reaction causing severe rash involving mucus membranes or skin necrosis: no Has patient had a PCN reaction that required hospitalization no Has patient had a PCN reaction occurring within the last 10 years: no If all of the above answers are "NO", then may proceed with Cephalosporin use.     VITALS:  Blood pressure 122/64, pulse 71, temperature 98.2 F (36.8 C), temperature source Oral, resp. rate (!) 25, height 5\' 6"  (1.676 m), weight 62.5 kg (137 lb 12.6 oz), SpO2 94 %.  PHYSICAL EXAMINATION:  GENERAL:  72 y.o.-year-old patient lying in the bed with no acute  distress.  EYES: Pupils equal, round, reactive to light and accommodation. No scleral icterus. Extraocular muscles intact.  HEENT: Head atraumatic, normocephalic. Oropharynx and nasopharynx clear.  NECK:  Supple, no jugular venous distention. No thyroid enlargement, no tenderness.  LUNGS: Normal breath sounds bilaterally, no wheezing, no crepitation. No use of accessory muscles of respiration. On oxygen via cannula. CARDIOVASCULAR: S1, S2 normal. No murmurs, rubs, or gallops.  ABDOMEN: Soft, nontender, nondistended. Bowel sounds present. No organomegaly or mass.  EXTREMITIES: No pedal edema, cyanosis, or clubbing.  NEUROLOGIC: Cranial nerves II through XII are intact. Muscle strength 4-5/5 in all extremities. Sensation intact. Gait not checked.  PSYCHIATRIC: The patient is alert and oriented x 3.  SKIN: No obvious rash, lesion, or ulcer.   Physical Exam LABORATORY PANEL:   CBC Recent Labs  Lab 01/25/18 0552  WBC 4.9  HGB 14.1  HCT 42.6  PLT 208   ------------------------------------------------------------------------------------------------------------------  Chemistries  Recent Labs  Lab 01/24/18 2200 01/25/18 0552  NA 134* 137  K 4.1 4.1  CL 101 104  CO2 27 26  GLUCOSE 138* 132*  BUN 9 12  CREATININE 0.51 0.60  CALCIUM 8.4* 8.4*  MG 1.9  --   AST  --  24  ALT  --  23  ALKPHOS  --  49  BILITOT  --  0.7   ------------------------------------------------------------------------------------------------------------------  Cardiac Enzymes Recent Labs  Lab 01/24/18 1310  TROPONINI <0.03   ------------------------------------------------------------------------------------------------------------------  RADIOLOGY:  Ct Angio Chest Pe W Or Wo Contrast  Result Date: 01/24/2018 CLINICAL DATA:  72 y/o F; 2 days of worsening shortness of breath  and cough. History of COPD. Influenza a positive. EXAM: CT ANGIOGRAPHY CHEST WITH CONTRAST TECHNIQUE: Multidetector CT imaging  of the chest was performed using the standard protocol during bolus administration of intravenous contrast. Multiplanar CT image reconstructions and MIPs were obtained to evaluate the vascular anatomy. CONTRAST:  38mL ISOVUE-370 IOPAMIDOL (ISOVUE-370) INJECTION 76% COMPARISON:  None. FINDINGS: Cardiovascular: Normal caliber thoracic aorta. Moderate calcific atherosclerosis. Dense calcified plaque of left subclavian artery origin with moderate to severe stenosis. Normal heart size. No pericardial effusion. Mild coronary artery calcification. Normal size main pulmonary artery. Satisfactory opacification of main pulmonary artery. Respiratory motion artifact in lung bases. No pulmonary embolus identified. Mediastinum/Nodes: No enlarged mediastinal, hilar, or axillary lymph nodes. Thyroid gland, trachea, and esophagus demonstrate no significant findings. Lungs/Pleura: Pulmonary nodules: 1. Right upper lobe, solid, 12 x 12 mm, series 7: Image 35. 2. Bilateral upper lobe calcified granuloma, 7:22, 50. 3. Left lung apex, scar-like, 20 x 8 mm, 7:5. 4. Left upper lobe, solid, 6 mm, 7:23. Small cluster of nodules in dependent left lower lobe superior segment (series 7, image 48) likely representing minimal bronchiolitis. Severe emphysema. No consolidation, effusion, or pneumothorax. Upper Abdomen: No acute abnormality. Musculoskeletal: No acute fracture. Review of the MIP images confirms the above findings. IMPRESSION: 1. Respiratory motion artifact in lung bases. No pulmonary embolus identified. 2. Severe emphysema. 3. Coronary and aortic calcific atherosclerosis. 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 4. Multiple pulmonary nodules measuring up to 12 mm in the right upper lobe. Non-contrast chest CT at 3-6 months is recommended. If the nodules are stable at time of repeat CT, then future CT at 18-24 months (from today's scan) is considered optional for low-risk patients, but is  recommended for high-risk patients. This recommendation follows the consensus statement: Guidelines for Management of Incidental Pulmonary Nodules Detected on CT Images: From the Fleischner Society 2017; Radiology 2017; 284:228-243. 5. Minimal bronchiolitis and left lower lobe. No consolidation, effusion, pneumothorax. Electronically Signed   By: Kristine Garbe M.D.   On: 01/24/2018 18:41    ASSESSMENT AND PLAN:   Principal Problem:   Acute respiratory failure (HCC) Active Problems:   COPD (chronic obstructive pulmonary disease) (HCC)   Tobacco dependence   Influenza A  * Acute respiratory failure on chronic   Chronically on 3 L oxygen at home but   Initially required BiPAP, back on nasal cannula, try to taper up to 3 L.  * COPD exacerbation   Influenza A infection   Tamiflu.   IV solumedrol. Nebs, Spiriva.   Stopped Abx.  * Active smoking   Counseled to quit smoking for 4 minutes and offered nicotine patch.  * Generalized weakness, PT eval.  All the records are reviewed and case discussed with Care Management/Social Workerr. Management plans discussed with the patient, family and they are in agreement.  CODE STATUS: Full.  TOTAL TIME TAKING CARE OF THIS PATIENT: 35 minutes.   Son in room. Discussed with ICU physician.  POSSIBLE D/C IN 1-2 DAYS, DEPENDING ON CLINICAL CONDITION.   Vaughan Basta M.D on 01/26/2018   Between 7am to 6pm - Pager - [phone]  After 6pm go to [email or web address] - password [email or web address] [facility]  Office  [phone]  CC: Primary care physician; Dion Body, MD  Note: This dictation was prepared with Dragon dictation along with smaller phrase technology. Any transcriptional errors that result from this process are unintentional.

## 2018-01-26 NOTE — Progress Notes (Signed)
Per Dr Marthann Schiller ok to order PT for patient

## 2018-01-26 NOTE — Evaluation (Signed)
Physical Therapy Evaluation Patient Details Name: Margaret Stevenson MRN: [id number] DOB: [birth year] Today's Date: 01/26/2018   History of Present Illness  Pt is a 72 y/o F who presented with worsening SOB and cough, initially requiring BiPAP.  Influenza A positive.  Pt's PMH includes melanoma, COPD, tobacco dependence.      Clinical Impression  Pt admitted with above diagnosis. Pt currently with functional limitations due to the deficits listed below (see PT Problem List). Margaret Stevenson currently requires close min guard assist for sit<>stand transfers as mild instability noted but no LOB. She does stumble L/R x2 with no LOB and able to self correct when ambulating short distances in her room.  She requires several seated rest breaks to ambulate a total of 40 ft.  Recommend OT Evaluation to address energy conservation.  Encouraged pt to ambulate in room and hallway with nursing staff at least 3x/day for improved pulmonary function.   Pt will benefit from skilled PT to increase their independence and safety with mobility to allow discharge to the venue listed below.      Follow Up Recommendations Home health PT    Equipment Recommendations  None recommended by PT    Recommendations for Other Services OT consult(Energy conservation techniques)     Precautions / Restrictions Precautions Precautions: Fall;Other (comment) Precaution Comments: O2 Restrictions Weight Bearing Restrictions: No      Mobility  Bed Mobility Overal bed mobility: Modified Independent Bed Mobility: Supine to Sit;Sit to Supine     Supine to sit: Modified independent (Device/Increase time) Sit to supine: Modified independent (Device/Increase time)   General bed mobility comments: No physical assist required.  Slighty increased time.   Transfers Overall transfer level: Needs assistance Equipment used: None Transfers: Sit to/from Stand Sit to Stand: Min guard         General transfer comment: Pt slow to  stand and demonstrates mild instability but no LOB.   Ambulation/Gait Ambulation/Gait assistance: Min guard Ambulation Distance (Feet): 40 Feet(10, 15, 15) Assistive device: None Gait Pattern/deviations: Step-through pattern;Decreased stride length Gait velocity: decreased Gait velocity interpretation: Below normal speed for age/gender General Gait Details: Pt ambulates 40 ft total in 3 separate bouts with seated rest break between as pt reports fatigue.  Pt requires close min guard assist as she stumbles L/R x2 but no LOB, pt able to steady.  SpO2 remains at or above 89% on 4L O2 while ambulating.   Stairs            Wheelchair Mobility    Modified Rankin (Stroke Patients Only)       Balance Overall balance assessment: Needs assistance Sitting-balance support: No upper extremity supported;Feet supported Sitting balance-Leahy Scale: Good     Standing balance support: No upper extremity supported;During functional activity Standing balance-Leahy Scale: Fair Standing balance comment: Pt able to stand statically without UE support but would likely lose her balance with perturbation without UE support                             Pertinent Vitals/Pain Pain Assessment: No/denies pain    Home Living Family/patient expects to be discharged to:: Private residence Living Arrangements: Alone Available Help at Discharge: Family;Friend(s);Available PRN/intermittently;Neighbor Type of Home: House Home Access: Stairs to enter Entrance Stairs-Rails: Left;Right;Can reach both Technical brewer of Steps: 4 Home Layout: One level Home Equipment: [place] - 4 wheels      Prior Function Level of Independence: Needs assistance  Gait / Transfers Assistance Needed: Pt ambulates limited household distances due to fatigue and SOB.  She monitors her O2 using her own pulse ox.  Pt takes seated breaks when she fatigues.  No falls in the past 6 months.   ADL's / Homemaking  Assistance Needed: Pt has been taking sponge baths independently.  Pt makes microwave meals, cooking only periodically.  Pt still driving.  Has a lady who comes for 2 hrs 1x/wk to assist with laundry and other cleaning activities.          Hand Dominance        Extremity/Trunk Assessment   Upper Extremity Assessment Upper Extremity Assessment: (BUE strength grossly 4-/5)    Lower Extremity Assessment Lower Extremity Assessment: (BLE strength grossly 4-/5)    Cervical / Trunk Assessment Cervical / Trunk Assessment: Kyphotic(mildly)  Communication   Communication: No difficulties  Cognition Arousal/Alertness: Awake/alert Behavior During Therapy: WFL for tasks assessed/performed Overall Cognitive Status: Within Functional Limits for tasks assessed                                        General Comments      Exercises Other Exercises Other Exercises: Seated scapular retractions with 5 second holds x10.   Other Exercises: Demonstration and cues for pursed lip breathing which pt demonstrates back to this PT throughout session.   Other Exercises: Encouraged pt to ambulate in room and hallway with nursing staff at least 3x/day for improved pulmonary function.    Assessment/Plan    PT Assessment Patient needs continued PT services  PT Problem List Decreased strength;Cardiopulmonary status limiting activity;Decreased activity tolerance;Decreased balance;Decreased knowledge of use of DME;Decreased safety awareness       PT Treatment Interventions DME instruction;Gait training;Stair training;Functional mobility training;Therapeutic activities;Therapeutic exercise;Balance training;Neuromuscular re-education;Patient/family education    PT Goals (Current goals can be found in the Care Plan section)  Acute Rehab PT Goals Patient Stated Goal: to return home PT Goal Formulation: With patient Time For Goal Achievement: 02/09/18 Potential to Achieve Goals: Good     Frequency Min 2X/week   Barriers to discharge        Co-evaluation               AM-PAC PT "6 Clicks" Daily Activity  Outcome Measure Difficulty turning over in bed (including adjusting bedclothes, sheets and blankets)?: None Difficulty moving from lying on back to sitting on the side of the bed? : A Little Difficulty sitting down on and standing up from a chair with arms (e.g., wheelchair, bedside commode, etc,.)?: A Little Help needed moving to and from a bed to chair (including a wheelchair)?: A Little Help needed walking in hospital room?: A Little Help needed climbing 3-5 steps with a railing? : A Lot 6 Click Score: 18    End of Session Equipment Utilized During Treatment: Oxygen Activity Tolerance: Treatment limited secondary to medical complications (Comment);Patient limited by fatigue(hypoxia) Patient left: in bed;with call bell/phone within reach;Other (comment)(unable to set bed alarm, pt declines lying in center of bed) Nurse Communication: Mobility status;Other (comment)(SpO2) PT Visit Diagnosis: Unsteadiness on feet (R26.81);Muscle weakness (generalized) (M62.81);Difficulty in walking, not elsewhere classified (R26.2)    Time: 2130-8657 PT Time Calculation (min) (ACUTE ONLY): 25 min   Charges:   PT Evaluation $PT Eval Low Complexity: 1 Low PT Treatments $Gait Training: 8-22 mins   PT G Codes:  Collie Siad PT, DPT 01/26/2018, 3:57 PM

## 2018-01-26 NOTE — Progress Notes (Addendum)
Cognition intact No distress Still with dyspnea on minimal exertion Otherwise, no new complaints She desaturates readily with minimal exertion.  Presently on 4 LPM by [place].  At home, wears 3 LPM by [place] chronically Reports that she still smokes intermittently  Vitals:   01/26/18 0900 01/26/18 1000 01/26/18 1100 01/26/18 1200  BP: 127/66 120/66 132/64 122/65  Pulse: 93 97 81 85  Resp: 17 19 (!) 21 16  Temp:    98.2 F (36.8 C)  TempSrc:    Oral  SpO2: 90% 93% 93% 92%  Weight:      Height:      4 LPM   Gen: Appears older than chronological age 71: NCAT, sclerae white, oropharynx normal Neck: No LAN, no JVD noted Lungs: Markedly diminished breath sounds throughout, no wheezes Cardiovascular: Regular, no M noted Abdomen: Soft, NT, +BS Ext: no C/C/E Neuro: grossly intact   BMP Latest Ref Rng & Units 01/25/2018 01/24/2018 01/24/2018  Glucose 65 - 99 mg/dL 132(H) 138(H) 129(H)  BUN 6 - 20 mg/dL 12 9 9   Creatinine 0.44 - 1.00 mg/dL 0.60 0.51 0.72  Sodium 135 - 145 mmol/L 137 134(L) 134(L)  Potassium 3.5 - 5.1 mmol/L 4.1 4.1 4.1  Chloride 101 - 111 mmol/L 104 101 98(L)  CO2 22 - 32 mmol/L 26 27 25   Calcium 8.9 - 10.3 mg/dL 8.4(L) 8.4(L) 9.0    CBC Latest Ref Rng & Units 01/25/2018 01/24/2018 07/01/2017  WBC 3.6 - 11.0 K/uL 4.9 8.5 14.5(H)  Hemoglobin 12.0 - 16.0 g/dL 14.1 15.7 8.8(L)  Hematocrit 35.0 - 47.0 % 42.6 47.3(H) 26.5(L)  Platelets 150 - 440 K/uL 208 225 184    CXR: No new film  IMPRESSION: COPD exacerbation Influenza A pneumonia Smoker Acute/chronic hypoxemic respiratory failure    PLAN/REC: Continue supplemental oxygen to maintain SPO2 >90% Continue nebulized steroids and bronchodilators Continue systemic steroids -dose reduced 2/18 Complete 5 days oseltamivir Discontinue antibacterial antibiotics I counseled her regarding the need for complete abstinence from cigarettes Transfer to [facility] has been ordered  After transfer, PCCM will sign off. Please call  if we can be of further assistance. She is followed by Dr. Raul Del as an outpatient  Merton Border, MD PCCM service Mobile [phone] Pager [phone] 01/26/2018 1:25 PM

## 2018-01-26 NOTE — Progress Notes (Signed)
Pt remaining on ICU at this time, no bed assigned on Med Surg.  Pt ambulatory in room w/assist, up to side cabinet toilet to urinate, refuses laxatives and states she will have BM after she gets to a room and can "close the door and flush the toilet."  A&O x 4, denies pain, has been on 4 liters w/VSS this shift, O2 sats in mid eighties on 3 liters.

## 2018-01-27 LAB — URINE CULTURE

## 2018-01-27 LAB — GLUCOSE, CAPILLARY: Glucose-Capillary: 125 mg/dL — ABNORMAL HIGH (ref 65–99)

## 2018-01-27 MED ORDER — PREDNISONE 20 MG PO TABS: 40.0000 mg | ORAL_TABLET | Freq: Every day | ORAL | Status: DC

## 2018-01-27 MED ORDER — CIPROFLOXACIN HCL 500 MG PO TABS: 500.0000 mg | ORAL_TABLET | Freq: Two times a day (BID) | ORAL | 0 refills | Status: AC

## 2018-01-27 MED ORDER — PREDNISONE 20 MG PO TABS: 40.0000 mg | ORAL_TABLET | Freq: Every day | ORAL | 0 refills | Status: AC

## 2018-01-27 MED ORDER — CIPROFLOXACIN HCL 500 MG PO TABS
500.0000 mg | ORAL_TABLET | Freq: Two times a day (BID) | ORAL | Status: DC
  Filled 2018-01-27: qty 1

## 2018-01-27 MED ORDER — PANTOPRAZOLE SODIUM 40 MG PO TBEC: 40.0000 mg | DELAYED_RELEASE_TABLET | Freq: Two times a day (BID) | ORAL | Status: DC

## 2018-01-27 MED ORDER — CIPROFLOXACIN HCL 500 MG PO TABS
500.0000 mg | ORAL_TABLET | Freq: Two times a day (BID) | ORAL | Status: DC
  Administered 2018-01-27: 500 mg via ORAL
  Filled 2018-01-27 (×2): qty 1

## 2018-01-27 MED ORDER — OSELTAMIVIR PHOSPHATE 75 MG PO CAPS: 75.0000 mg | ORAL_CAPSULE | Freq: Two times a day (BID) | ORAL | 0 refills | Status: AC

## 2018-01-27 NOTE — Evaluation (Signed)
Occupational Therapy Evaluation Patient Details Name: Margaret Stevenson MRN: [id number] DOB: 05-29-1946 Today's Date: 01/27/2018    History of Present Illness Pt is a 72 y/o F who presented with worsening SOB and cough, initially requiring BiPAP.  Influenza A positive.  Pt's PMH includes melanoma, COPD, tobacco dependence.     Clinical Impression   Pt seen for OT evaluation this date. Pt verbalizes experience volunteering in pulmonary rehab for 7 years, generally modified independent at baseline using rollator for seated rest breaks throughout the day, takes sponge baths, and has lady assist with cleaning, groceries, and laundry 2hrs x 1/wk. Pt presents near baseline, but fatigues quickly. Instructed in energy conservation strategies with handout provided, encouraging self monitoring of fatigue throughout the day to better identify when she "over does" it, instructed in UTI prevention, and managing O2 while ambulating versus resting. Pt verbalized understanding of all education/training provided this date. Pt eager to return home, declines additional therapy services at this time; appreciative of education provided. Will sign off. Please re-consult if additional needs arise.     Follow Up Recommendations  No OT follow up    Equipment Recommendations  None recommended by OT    Recommendations for Other Services       Precautions / Restrictions Precautions Precautions: Fall;Other (comment) Precaution Comments: O2 Restrictions Weight Bearing Restrictions: No      Mobility Bed Mobility Overal bed mobility: Modified Independent Bed Mobility: Supine to Sit;Sit to Supine     Supine to sit: Modified independent (Device/Increase time) Sit to supine: Modified independent (Device/Increase time)      Transfers Overall transfer level: Needs assistance Equipment used: None Transfers: Sit to/from Stand Sit to Stand: Min guard              Balance Overall balance assessment: Needs  assistance Sitting-balance support: No upper extremity supported;Feet supported Sitting balance-Leahy Scale: Good     Standing balance support: No upper extremity supported;During functional activity Standing balance-Leahy Scale: Fair                             ADL either performed or assessed with clinical judgement   ADL Overall ADL's : Modified independent                                       General ADL Comments: pt able to perform ADL tasks with additional time/effort to perform but no physical assist required. Min guard for mobility.      Vision Baseline Vision/History: Wears glasses Wears Glasses: Reading only Patient Visual Report: No change from baseline       Perception     Praxis      Pertinent Vitals/Pain Pain Assessment: No/denies pain     Hand Dominance     Extremity/Trunk Assessment Upper Extremity Assessment Upper Extremity Assessment: Overall WFL for tasks assessed(grossly WFL, 4/5 bilaterally)   Lower Extremity Assessment Lower Extremity Assessment: Overall WFL for tasks assessed(grossly WFL, 4/5 bilaterally)   Cervical / Trunk Assessment Cervical / Trunk Assessment: Kyphotic(mildly)   Communication Communication Communication: No difficulties   Cognition Arousal/Alertness: Awake/alert Behavior During Therapy: WFL for tasks assessed/performed Overall Cognitive Status: Within Functional Limits for tasks assessed  General Comments       Exercises Other Exercises Other Exercises: Pt instructed in energy conservation strategies including home/routines modifications, activity pacing, work simplification, review of PLB, and falls prevention strategies to maximize independence and minimize over exertion Other Exercises: Pt instructed in UTI prevention strategies Other Exercises: Encouraged pt to monitor O2 sats during mobility, increasing O2 to 4Lpm as needed for  mobility and decrease back to 3Lpm once at rest   Shoulder Instructions      Home Living Family/patient expects to be discharged to:: Private residence Living Arrangements: Alone Available Help at Discharge: Family;Friend(s);Available PRN/intermittently;Neighbor Type of Home: House Home Access: Stairs to enter [employer] of Steps: 4 Entrance Stairs-Rails: Left;Right;Can reach both [place]: One level     Bathroom Shower/Tub: Teacher, early years/pre: [place]: Environmental consultant - 4 wheels;Shower seat          Prior Functioning/Environment Level of Independence: Needs assistance  Gait / Transfers Assistance Needed: Pt ambulates limited household distances due to fatigue and SOB.  She monitors her O2 using her own pulse ox.  Pt takes seated breaks on rollator when she fatigues.  No falls in the past 6 months.  ADL's / Homemaking Assistance Needed: Pt has been taking sponge baths independently.  Pt makes microwave meals, cooking only periodically.  Pt still driving.  Has a lady who comes for 2 hrs 1x/wk to assist with laundry and other cleaning activities. Gets groceries on days where cleaning lady is coming so she can assist in bringing groceries in.             OT Problem List:        OT Treatment/Interventions:      OT Goals(Current goals can be found in the care plan section) Acute Rehab OT Goals Patient Stated Goal: to return home OT Goal Formulation: All assessment and education complete, DC therapy  OT Frequency:     Barriers to D/C:            Co-evaluation              AM-PAC PT "6 Clicks" Daily Activity     Outcome Measure Help from another person eating meals?: None Help from another person taking care of personal grooming?: None Help from another person toileting, which includes using toliet, bedpan, or urinal?: None Help from another person bathing (including washing, rinsing, drying)?: None Help from another  person to put on and taking off regular upper body clothing?: None Help from another person to put on and taking off regular lower body clothing?: None 6 Click Score: 24   End of Session    Activity Tolerance: Patient tolerated treatment well Patient left: in bed;with call bell/phone within reach  OT Visit Diagnosis: Other abnormalities of gait and mobility (R26.89)                Time: 1027-2536 OT Time Calculation (min): 40 min Charges:  OT General Charges $OT Visit: 1 Visit OT Evaluation $OT Eval Low Complexity: 1 Low OT Treatments $Self Care/Home Management : 23-37 mins  Jeni Salles, MPH, MS, OTR/L ascom [phone] 01/27/18, 11:11 AM

## 2018-01-27 NOTE — Plan of Care (Signed)
Pt discharged to home, she was cautioned to increase O2 to 4 liters with activity, OT and PT have seen, IV sites DCd,  bleeding controlled, DC instructions given, scrips to pharmacy, pt verbalized understanding.  Her neighbor will take her home. Pt has her portable O2 with her

## 2018-01-27 NOTE — Progress Notes (Signed)
Pt refused bipap

## 2018-02-02 ENCOUNTER — Telehealth: Payer: Self-pay

## 2018-02-02 NOTE — Care Management Note (Signed)
Case Management Note  Patient Details  Name: Zaineb Nowaczyk MRN: [id number] Date of Birth: Nov 28, 1946  Subjective/Objective:                  Post discharge note 02/02/18- Per EMMI call request this RNCM contacted patient at [phone]. EMMI states patient had questions about discharge papers and changes in medications however patient states "that's all the call offered and I just needed someone to call me back".  She states she need "someone to come out to help her". It sounded at first like she was requesting private duty as she mentioned talking to [facility].  I explained that her Medicare would not cover that and she denied having Medicaid. She agrees to home health PT, RN, CNA and would [facility]. I explained that her PCP would have to agree and they may want to see her first. She agreed.   Action/Plan:   Home health agencies read to patient. She prefers [facility]. Corene Cornea with [facility] notified of this need. This RNCM left message for Dr. Netty Starring at [facility].   Expected Discharge Date:  01/27/18               Expected Discharge Plan:     In-House Referral:     Discharge planning Services     Post Acute Care Choice:  Home Health Choice offered to:  Patient  DME Arranged:    DME Agency:     HH Arranged:  RN, PT, Nurse's Aide [place] Agency:  [place]  Status of Service:  Completed, signed off  If discussed at [place] of Stay Meetings, dates discussed:    Additional Comments:  Marshell Garfinkel, RN 02/02/2018, 11:43 AM

## 2018-02-02 NOTE — Telephone Encounter (Signed)
RNCM spoke with patient after seeing patient request for callback on EMMI.  Patient agrees to home health PT, RN, and CNA. Dr. Netty Starring wants to see her however he agrees with this request for home health.

## 2018-02-03 ENCOUNTER — Telehealth: Payer: Self-pay

## 2018-02-03 NOTE — Telephone Encounter (Signed)
EMMI Follow-up: Received VM from patient re: [place] [facility]. I called her back and said she hadn't heard about status of HH yet.  I talked with Levada Dy, CM and MD had agreed to [facility] and was set up with [facility]. Pt. was thinking about calling EMS and coming back to the hospital if she couldn't get [place].  Contacted Jason with [facility] & he called the main branch.  Order for [facility] obtained this morning.  Corene Cornea asked them to move her to the top of the call back list today.  Made second call to pt. to let her know order obtained for [facility] and they would call and set up an time with her.  Provided her with local office number [phone]. She was appreciative of the follow-up call.

## 2018-02-03 NOTE — Discharge Summary (Signed)
[facility] [place] NAME: Margaret Stevenson    MR#:  [id number]  DATE OF BIRTH:  04-07-1946  DATE OF ADMISSION:  01/24/2018 ADMITTING PHYSICIAN: Idelle Crouch, MD  DATE OF DISCHARGE: 01/27/2018 12:45 PM  PRIMARY CARE PHYSICIAN: Dion Body, MD    ADMISSION DIAGNOSIS:  Influenza A [J10.1] COPD with acute exacerbation ([place]) [J44.1] Acute on chronic respiratory failure with hypoxemia ([place]) [J96.21]  DISCHARGE DIAGNOSIS:  Principal Problem:   Acute respiratory failure ([place]) Active Problems:   COPD (chronic obstructive pulmonary disease) (H[facility])   Tobacco dependence   Influenza A   SECONDARY DIAGNOSIS:   Past Medical History:  Diagnosis Date  . Arthritis    hands  . Cancer ([place])    melanoma 07/2013  . COPD (chronic obstructive pulmonary disease) ([place])   . Dyspnea   . Jaundice 04/2017    HOSPITAL COURSE:   * Acute respiratory failure on chronic   Chronically on 3 L oxygen at home but   Initially required BiPAP, back on nasal cannula, improved.  * COPD exacerbation   Influenza A infection   Tamiflu.   IV solumedrol. Nebs, Spiriva.   Stopped Abx.  finish course at home.  * Active smoking   Counseled to quit smoking for 4 minutes and offered nicotine patch.  * Generalized weakness, PT eval.    DISCHARGE CONDITIONS:   Stable.  CONSULTS OBTAINED:  Treatment Team:  Lafayette Dragon, MD  DRUG ALLERGIES:   Allergies  Allergen Reactions  . Penicillins Hives    Has patient had a PCN reaction causing immediate rash, facial/tongue/throat swelling, SOB or lightheadedness with hypotension: no Has patient had a PCN reaction causing severe rash involving mucus membranes or skin necrosis: no Has patient had a PCN reaction that required hospitalization no Has patient had a PCN reaction occurring within the last 10 years: no If all of the above answers are "NO", then may proceed with Cephalosporin use.      DISCHARGE MEDICATIONS:   Allergies as of 01/27/2018      Reactions   Penicillins Hives   Has patient had a PCN reaction causing immediate rash, facial/tongue/throat swelling, SOB or lightheadedness with hypotension: no Has patient had a PCN reaction causing severe rash involving mucus membranes or skin necrosis: no Has patient had a PCN reaction that required hospitalization no Has patient had a PCN reaction occurring within the last 10 years: no If all of the above answers are "NO", then may proceed with Cephalosporin use.      Medication List    STOP taking these medications   Na Sulfate-K Sulfate-Mg Sulf 17.5-3.13-1.6 GM/177ML Soln Commonly known as:  SUPREP BOWEL PREP KIT     TAKE these medications   Fluticasone-Salmeterol 250-50 MCG/DOSE Aepb Commonly known as:  ADVAIR Inhale 1 puff into the lungs 2 (two) times daily.   OXYGEN Inhale 3 L into the lungs. 1 L when not active   tiotropium 18 MCG inhalation capsule Commonly known as:  SPIRIVA Place 18 mcg into inhaler and inhale at bedtime.   TURMERIC PO Take by mouth.   VENTOLIN HFA 108 (90 Base) MCG/ACT inhaler Generic drug:  albuterol Inhale 2 puffs into the lungs every 4 (four) hours as needed for shortness of breath.     ASK your doctor about these medications   ciprofloxacin 500 MG tablet Commonly known as:  CIPRO Take 1 tablet (500 mg total) by mouth 2 (two) times daily for  3 days. Ask about: Should I take this medication?   oseltamivir 75 MG capsule Commonly known as:  TAMIFLU Take 1 capsule (75 mg total) by mouth 2 (two) times daily for 2 days. Ask about: Should I take this medication?   predniSONE 20 MG tablet Commonly known as:  DELTASONE Take 2 tablets (40 mg total) by mouth daily with breakfast for 3 days. Ask about: Should I take this medication?        DISCHARGE INSTRUCTIONS:    Follow with PMD in 1-2 weeks.  If you experience worsening of your admission symptoms, develop shortness  of breath, life threatening emergency, suicidal or homicidal thoughts you must seek medical attention immediately by calling 911 or calling your MD immediately  if symptoms less severe.  You Must read complete instructions/literature along with all the possible adverse reactions/side effects for all the Medicines you take and that have been prescribed to you. Take any new Medicines after you have completely understood and accept all the possible adverse reactions/side effects.   Please note  You were cared for by a hospitalist during your hospital stay. If you have any questions about your discharge medications or the care you received while you were in the hospital after you are discharged, you can call the unit and asked to speak with the hospitalist on call if the hospitalist that took care of you is not available. Once you are discharged, your primary care physician will handle any further medical issues. Please note that NO REFILLS for any discharge medications will be authorized once you are discharged, as it is imperative that you return to your primary care physician (or establish a relationship with a primary care physician if you do not have one) for your aftercare needs so that they can reassess your need for medications and monitor your lab values.    Today   CHIEF COMPLAINT:   Chief Complaint  Patient presents with  . COPD    HISTORY OF PRESENT ILLNESS:  Margaret Stevenson  is a 72 y.o. female with a known history of COPD on 3L O2 at home now with 2 day hx of worsening SOB and cough. In ER, pt markedly hypoxic requiring BiPAP. Influenza A positive. CXR shows no obvious pneumonia. She is now admitted. Afebrile. No N/V/D. Denies CP.   VITAL SIGNS:  Blood pressure (!) 133/45, pulse (!) 105, temperature 97.9 F (36.6 C), temperature source Oral, resp. rate (!) 24, height '5\' 6"'$  (1.676 m), weight 62.5 kg (137 lb 12.6 oz), SpO2 92 %.  I/O:  No intake or output data in the 24 hours  ending 02/03/18 1001  PHYSICAL EXAMINATION:  GENERAL:  72 y.o.-year-old patient lying in the bed with no acute distress.  EYES: Pupils equal, round, reactive to light and accommodation. No scleral icterus. Extraocular muscles intact.  HEENT: Head atraumatic, normocephalic. Oropharynx and nasopharynx clear.  NECK:  Supple, no jugular venous distention. No thyroid enlargement, no tenderness.  LUNGS: Normal breath sounds bilaterally, no wheezing, rales,rhonchi or crepitation. No use of accessory muscles of respiration.  CARDIOVASCULAR: S1, S2 normal. No murmurs, rubs, or gallops.  ABDOMEN: Soft, non-tender, non-distended. Bowel sounds present. No organomegaly or mass.  EXTREMITIES: No pedal edema, cyanosis, or clubbing.  NEUROLOGIC: Cranial nerves II through XII are intact. Muscle strength 5/5 in all extremities. Sensation intact. Gait not checked.  PSYCHIATRIC: The patient is alert and oriented x 3.  SKIN: No obvious rash, lesion, or ulcer.   DATA REVIEW:   CBC  No results for input(s): WBC, HGB, HCT, PLT in the last 168 hours.  Chemistries  No results for input(s): NA, K, CL, CO2, GLUCOSE, BUN, CREATININE, CALCIUM, MG, AST, ALT, ALKPHOS, BILITOT in the last 168 hours.  Invalid input(s): GFRCGP  Cardiac Enzymes No results for input(s): TROPONINI in the last 168 hours.  Microbiology Results  Results for orders placed or performed during the hospital encounter of 01/24/18  MRSA PCR Screening     Status: None   Collection Time: 01/24/18  7:47 AM  Result Value Ref Range Status   MRSA by PCR NEGATIVE NEGATIVE Final    Comment: Performed at [facility], [address] [place], [place] [address]  Urine culture     Status: Abnormal   Collection Time: 01/24/18  5:55 PM  Result Value Ref Range Status   Specimen Description   Final    URINE, RANDOM Performed at [facility], [address] [place] [address]    Special Requests   Final    NONE Performed at  [facility], [address] [place], [place] [address]    Culture >=100,000 COLONIES/mL ESCHERICHIA COLI (A)  Final   Report Status 01/27/2018 FINAL  Final   Organism ID, Bacteria ESCHERICHIA COLI (A)  Final      Susceptibility   Escherichia coli - MIC*    AMPICILLIN <=2 SENSITIVE Sensitive     CEFAZOLIN <=4 SENSITIVE Sensitive     CEFTRIAXONE <=1 SENSITIVE Sensitive     CIPROFLOXACIN <=0.25 SENSITIVE Sensitive     GENTAMICIN <=1 SENSITIVE Sensitive     IMIPENEM <=0.25 SENSITIVE Sensitive     NITROFURANTOIN <=16 SENSITIVE Sensitive     TRIMETH/SULFA <=20 SENSITIVE Sensitive     AMPICILLIN/SULBACTAM <=2 SENSITIVE Sensitive     PIP/TAZO <=4 SENSITIVE Sensitive     Extended ESBL NEGATIVE Sensitive     * >=100,000 COLONIES/mL ESCHERICHIA COLI    RADIOLOGY:  No results found.  EKG:   Orders placed or performed during the hospital encounter of 01/24/18  . ED EKG  . ED EKG  . EKG      Management plans discussed with the patient, family and they are in agreement.  CODE STATUS:  Code Status History    Date Active Date Inactive Code Status Order ID Comments User Context   01/24/2018 18:13 01/27/2018 16:33 Full Code [id number]  Idelle Crouch, MD Inpatient   04/27/2017 03:41 04/28/2017 19:42 Full Code [id number]  Harrie Foreman, MD Inpatient   04/18/2017 17:28 04/19/2017 20:58 Full Code [id number]  Velvet Bathe, MD Inpatient   10/11/2016 23:16 10/18/2016 15:19 Full Code [id number]  Lance Coon, MD Inpatient   10/06/2016 19:07 10/10/2016 15:02 Full Code [id number]  Idelle Crouch, MD ED      TOTAL TIME TAKING CARE OF THIS PATIENT: 35 minutes.    Vaughan Basta M.D on 02/03/2018 at 10:01 AM  Between 7am to 6pm - Pager - [phone]  After 6pm go to [email or web address] - password [email or web address] [facility]  Office  [phone]  CC: Primary care physician; Dion Body, MD   Note: This dictation was prepared with Dragon dictation along  with smaller phrase technology. Any transcriptional errors that result from this process are unintentional.

## 2018-06-29 ENCOUNTER — Other Ambulatory Visit: Payer: Self-pay | Admitting: Family Medicine

## 2018-06-29 DIAGNOSIS — Z1231 Encounter for screening mammogram for malignant neoplasm of breast: Secondary | ICD-10-CM

## 2018-08-05 ENCOUNTER — Ambulatory Visit
Admission: RE | Admit: 2018-08-05 | Discharge: 2018-08-05 | Disposition: A | Discharge status: Home / Self Care | Payer: Medicare Other | Source: Ambulatory Visit | Attending: Family Medicine | Admitting: Family Medicine

## 2018-08-05 DIAGNOSIS — Z1231 Encounter for screening mammogram for malignant neoplasm of breast: Secondary | ICD-10-CM | POA: Insufficient documentation

## 2018-08-06 ENCOUNTER — Other Ambulatory Visit: Payer: Self-pay | Admitting: Family Medicine

## 2018-08-06 DIAGNOSIS — R928 Other abnormal and inconclusive findings on diagnostic imaging of breast: Secondary | ICD-10-CM

## 2018-08-17 ENCOUNTER — Other Ambulatory Visit: Payer: Medicare Other

## 2018-08-17 ENCOUNTER — Ambulatory Visit: Payer: Medicare Other

## 2018-08-18 ENCOUNTER — Ambulatory Visit
Admission: RE | Admit: 2018-08-18 | Discharge: 2018-08-18 | Disposition: A | Discharge status: Home / Self Care | Payer: Medicare Other | Source: Ambulatory Visit | Attending: Family Medicine | Admitting: Family Medicine

## 2018-08-18 DIAGNOSIS — R928 Other abnormal and inconclusive findings on diagnostic imaging of breast: Secondary | ICD-10-CM | POA: Insufficient documentation

## 2018-08-19 ENCOUNTER — Other Ambulatory Visit: Payer: Self-pay | Admitting: Family Medicine

## 2018-08-19 DIAGNOSIS — R921 Mammographic calcification found on diagnostic imaging of breast: Secondary | ICD-10-CM

## 2018-08-19 DIAGNOSIS — R928 Other abnormal and inconclusive findings on diagnostic imaging of breast: Secondary | ICD-10-CM

## 2018-08-26 ENCOUNTER — Ambulatory Visit
Admission: RE | Admit: 2018-08-26 | Discharge: 2018-08-26 | Disposition: A | Discharge status: Home / Self Care | Payer: Medicare Other | Source: Ambulatory Visit | Attending: Family Medicine | Admitting: Family Medicine

## 2018-08-26 DIAGNOSIS — R921 Mammographic calcification found on diagnostic imaging of breast: Secondary | ICD-10-CM

## 2018-08-26 DIAGNOSIS — R928 Other abnormal and inconclusive findings on diagnostic imaging of breast: Secondary | ICD-10-CM | POA: Insufficient documentation

## 2018-08-26 HISTORY — PX: BREAST BIOPSY: SHX20

## 2018-08-27 LAB — SURGICAL PATHOLOGY

## 2019-02-08 ENCOUNTER — Other Ambulatory Visit
Admission: RE | Admit: 2019-02-08 | Discharge: 2019-02-08 | Disposition: A | Discharge status: Home / Self Care | Payer: Medicare Other | Source: Ambulatory Visit | Attending: Allergy and Immunology | Admitting: Allergy and Immunology

## 2019-02-08 ENCOUNTER — Other Ambulatory Visit: Payer: Self-pay

## 2019-02-08 DIAGNOSIS — L298 Other pruritus: Secondary | ICD-10-CM | POA: Diagnosis present

## 2019-02-08 LAB — COMPREHENSIVE METABOLIC PANEL
ALT: 17 U/L (ref 0–44)
ANION GAP: 7 (ref 5–15)
AST: 19 U/L (ref 15–41)
Albumin: 3.9 g/dL (ref 3.5–5.0)
Alkaline Phosphatase: 56 U/L (ref 38–126)
BUN: 9 mg/dL (ref 8–23)
CHLORIDE: 99 mmol/L (ref 98–111)
CO2: 33 mmol/L — AB (ref 22–32)
CREATININE: 0.58 mg/dL (ref 0.44–1.00)
Calcium: 8.9 mg/dL (ref 8.9–10.3)
Glucose, Bld: 106 mg/dL — ABNORMAL HIGH (ref 70–99)
POTASSIUM: 3.9 mmol/L (ref 3.5–5.1)
SODIUM: 139 mmol/L (ref 135–145)
Total Bilirubin: 1.3 mg/dL — ABNORMAL HIGH (ref 0.3–1.2)
Total Protein: 7 g/dL (ref 6.5–8.1)

## 2019-02-08 LAB — CBC WITH DIFFERENTIAL/PLATELET
Abs Immature Granulocytes: 0.02 10*3/uL (ref 0.00–0.07)
BASOS ABS: 0.1 10*3/uL (ref 0.0–0.1)
Basophils Relative: 1 %
EOS PCT: 2 %
Eosinophils Absolute: 0.1 10*3/uL (ref 0.0–0.5)
HCT: 48.2 % — ABNORMAL HIGH (ref 36.0–46.0)
Hemoglobin: 15.4 g/dL — ABNORMAL HIGH (ref 12.0–15.0)
IMMATURE GRANULOCYTES: 0 %
LYMPHS PCT: 29 %
Lymphs Abs: 2.2 10*3/uL (ref 0.7–4.0)
MCH: 31.8 pg (ref 26.0–34.0)
MCHC: 32 g/dL (ref 30.0–36.0)
MCV: 99.4 fL (ref 80.0–100.0)
Monocytes Absolute: 0.6 10*3/uL (ref 0.1–1.0)
Monocytes Relative: 7 %
NEUTROS ABS: 4.7 10*3/uL (ref 1.7–7.7)
NEUTROS PCT: 61 %
NRBC: 0 % (ref 0.0–0.2)
PLATELETS: 251 10*3/uL (ref 150–400)
RBC: 4.85 MIL/uL (ref 3.87–5.11)
RDW: 13.8 % (ref 11.5–15.5)
WBC: 7.6 10*3/uL (ref 4.0–10.5)

## 2019-02-09 LAB — THYROID PANEL WITH TSH
Free Thyroxine Index: 1.8 (ref 1.2–4.9)
T3 Uptake Ratio: 27 % (ref 24–39)
T4, Total: 6.5 ug/dL (ref 4.5–12.0)
TSH: 2.28 u[IU]/mL (ref 0.450–4.500)

## 2019-02-10 LAB — MISC LABCORP TEST (SEND OUT)
LABCORP TEST CODE: 60245
LABCORP TEST CODE: 602459
LABCORP TEST CODE: 602479
LABCORP TEST CODE: 602759
Labcorp test code: 602452
Labcorp test code: 602453
Labcorp test code: 602461
Labcorp test code: 602473
Labcorp test code: 602498
Labcorp test code: 602509

## 2019-02-11 LAB — ALLERGENS W/TOTAL IGE AREA 2
Alternaria Alternata IgE: <0.1 kU/L
COCKROACH, GERMAN IGE: 0.25 kU/L — AB
COTTONWOOD IGE: 0.15 kU/L — AB
Cedar, Mountain IgE: 3.75 kU/L — AB
D Farinae IgE: 8 kU/L — AB
D001-IGE D PTERONYSSINUS: 4.8 kU/L — AB
E001-IGE CAT DANDER: 0.86 kU/L — AB
E005-IGE DOG DANDER: 0.21 kU/L — AB
G010-IGE JOHNSON GRASS: 0.27 kU/L — AB
IGE (IMMUNOGLOBULIN E), SERUM: 2271 [IU]/mL — AB (ref 6–495)
MOUSE URINE IGE: 1.79 kU/L — AB
Maple/Box Elder IgE: 0.1 kU/L — AB
Oak, White IgE: 1.07 kU/L — AB
Pecan, Hickory IgE: 2.28 kU/L — AB
Penicillium Chrysogen IgE: <0.1 kU/L
Pigweed, Rough IgE: <0.1 kU/L
Sheep Sorrel IgE Qn: 0.24 kU/L — AB
T003-IGE COMMON SILVER BIRCH: 0.48 kU/L — AB
T008-IGE ELM, AMERICAN: 0.17 kU/L — AB
Timothy Grass IgE: 0.59 kU/L — AB
W001-IGE RAGWEED, SHORT: 0.26 kU/L — AB
WHITE MULBERRY IGE: 0.18 kU/L — AB

## 2019-07-16 ENCOUNTER — Other Ambulatory Visit: Payer: Self-pay | Admitting: Family Medicine

## 2019-07-16 DIAGNOSIS — Z1231 Encounter for screening mammogram for malignant neoplasm of breast: Secondary | ICD-10-CM

## 2019-08-30 ENCOUNTER — Ambulatory Visit
Admission: RE | Admit: 2019-08-30 | Discharge: 2019-08-30 | Disposition: A | Discharge status: Home / Self Care | Payer: Medicare Other | Source: Ambulatory Visit | Attending: Family Medicine | Admitting: Family Medicine

## 2019-08-30 DIAGNOSIS — Z1231 Encounter for screening mammogram for malignant neoplasm of breast: Secondary | ICD-10-CM | POA: Diagnosis present

## 2020-03-23 IMAGING — MG MM DIGITAL SCREENING BILAT W/ TOMO W/ CAD
8 of 11 series · 9 of 27 positions shown · non-contrast
Comparison: Previous exam(s).

CLINICAL DATA: Screening.

EXAM:
DIGITAL SCREENING BILATERAL MAMMOGRAM WITH TOMO AND CAD

[R MLO synth-2D]
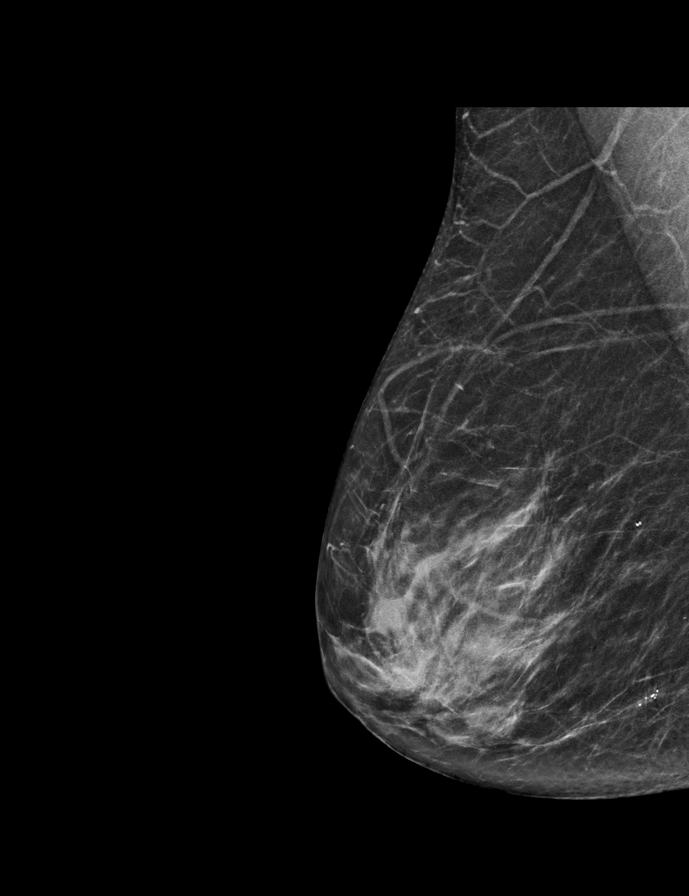

[L MLO synth-2D]
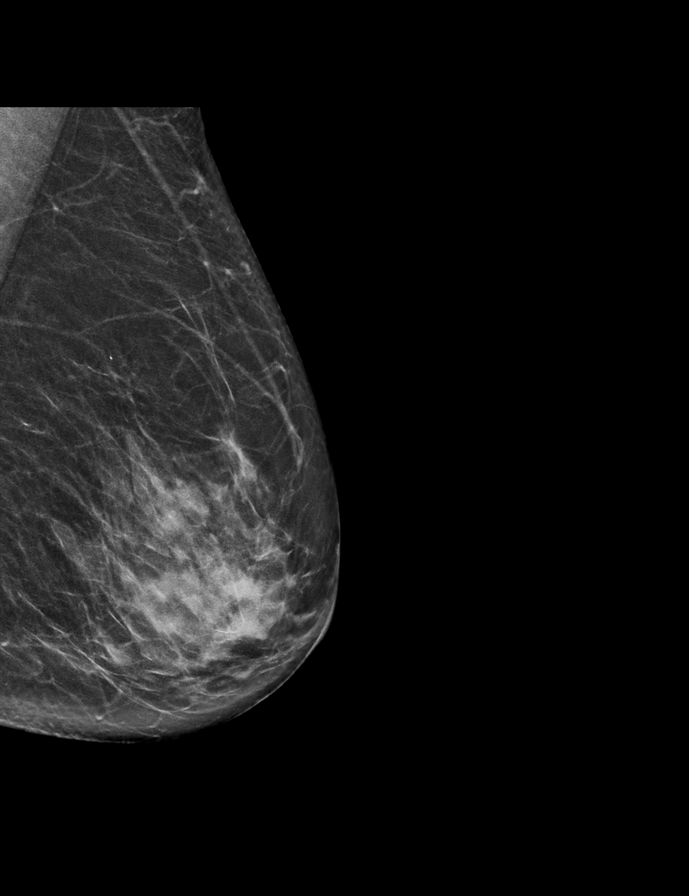

[R CC synth-2D]
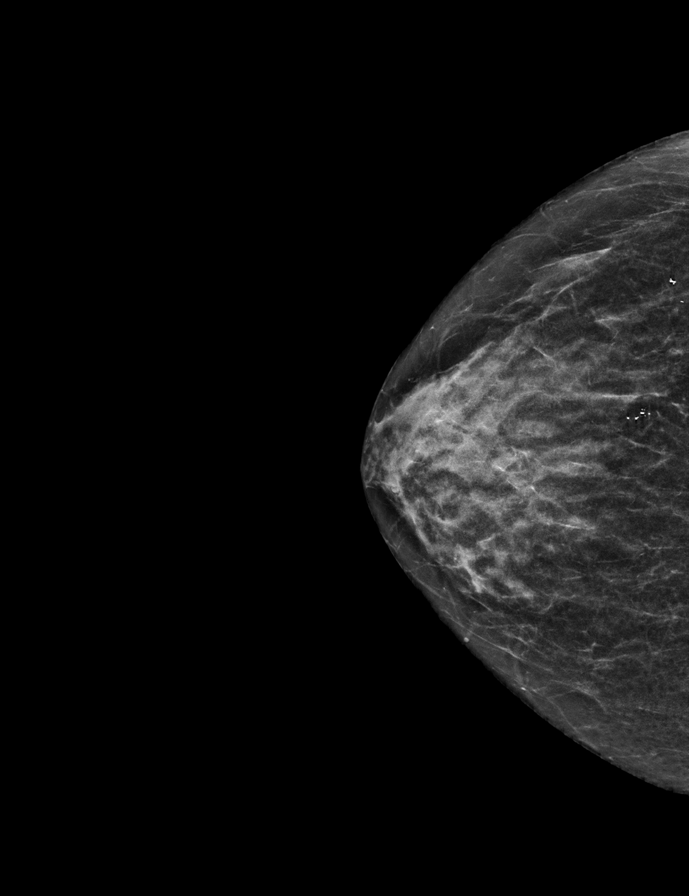

[L CC synth-2D]
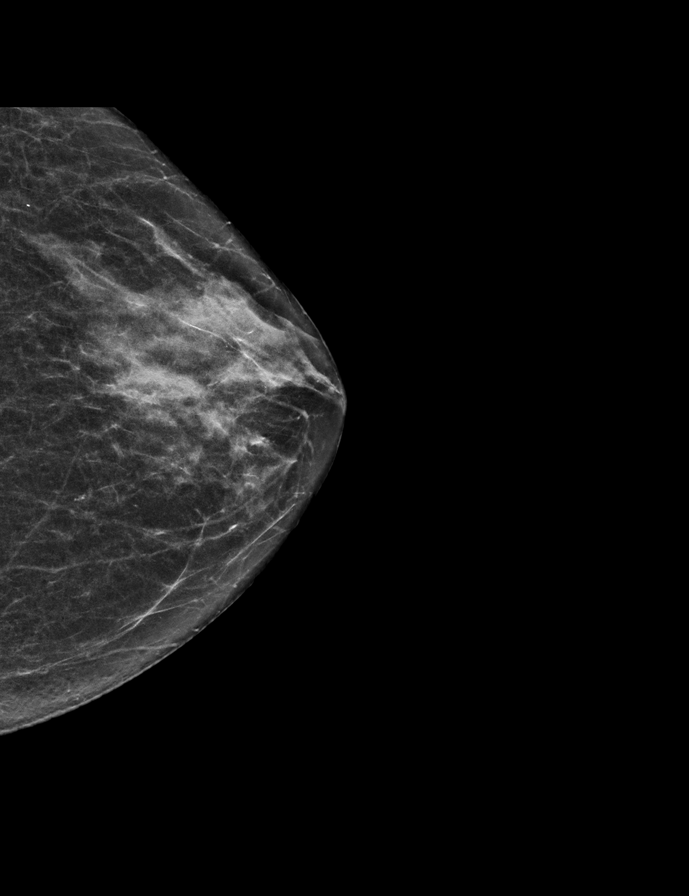

[R CC tomo · 2 of 45 frames shown]
[frame 15/45]
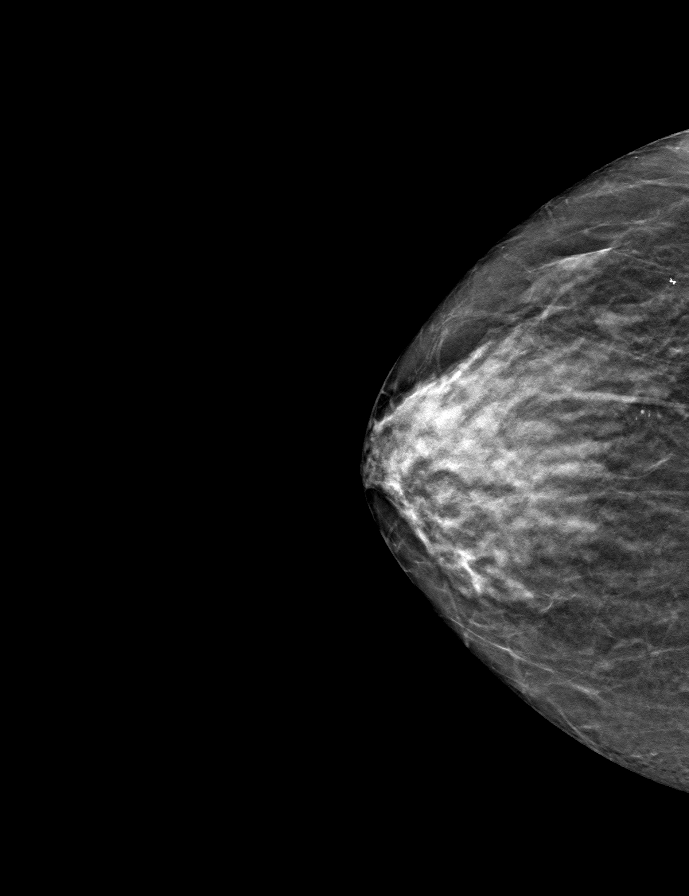
[frame 23/45]
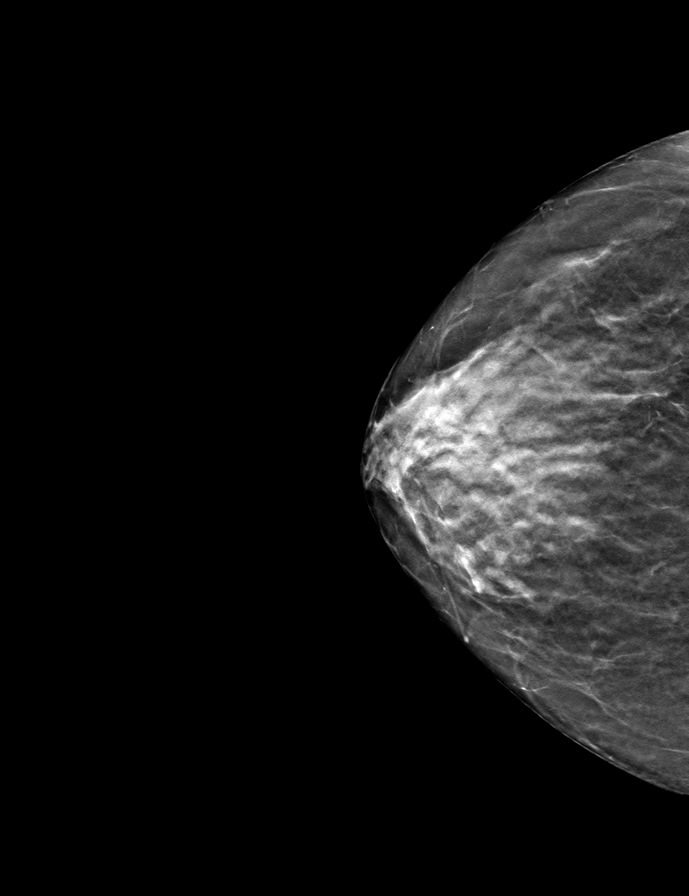

[R MLO (1 of 2)]
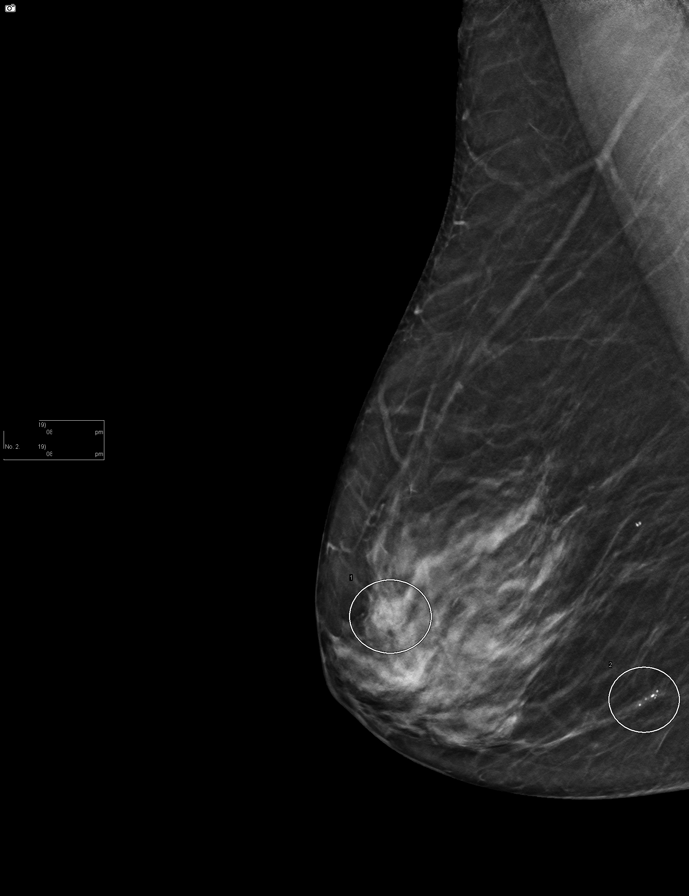

[R CC]
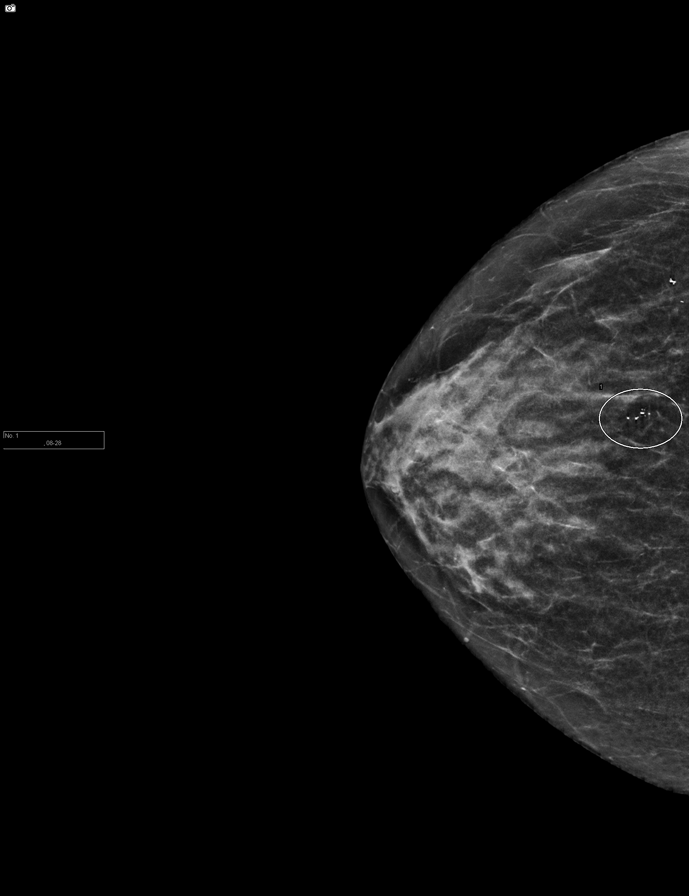

[R MLO (2 of 2)]
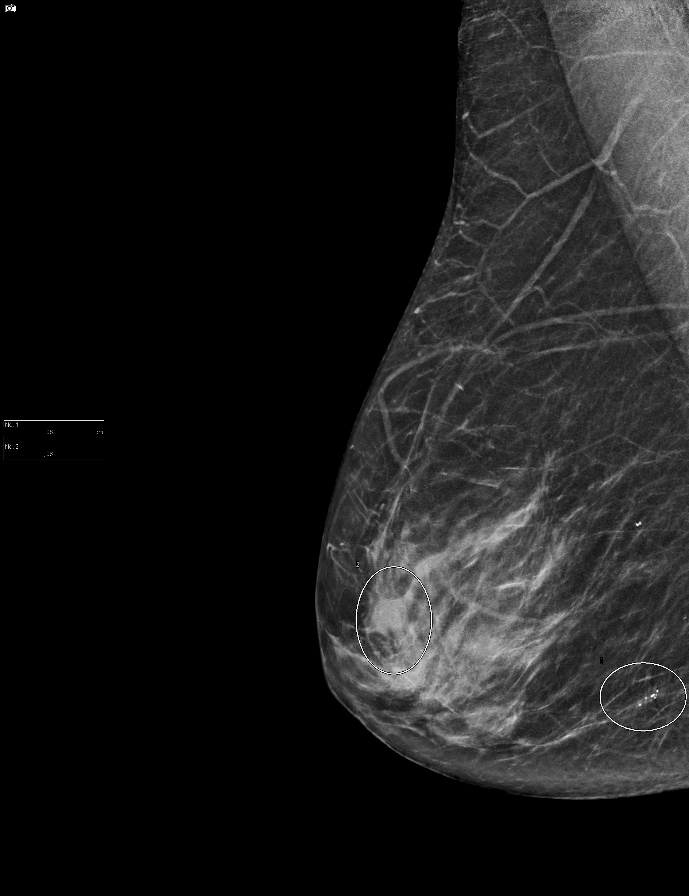

[9 of 27 positions shown; findings below may reference images not displayed]

ACR Breast Density Category c: The breast tissue is heterogeneously
dense, which may obscure small masses.
FINDINGS: In the right breast, calcifications in the lower central posterior
right breast and a separate asymmetry seen in the slightly upper
right breast on the MLO view only warrant further evaluation with
magnified views. In the left breast, no findings suspicious for
malignancy. Images were processed with CAD.
IMPRESSION: Further evaluation is suggested for calcifications and asymmetry in
the right breast.

RECOMMENDATION:
Diagnostic mammogram of the right breast. (Code:71-C-33A)

The patient will be contacted regarding the findings, and additional
imaging will be scheduled.

BI-RADS CATEGORY  0: Incomplete. Need additional imaging evaluation
and/or prior mammograms for comparison.

## 2020-03-30 ENCOUNTER — Telehealth: Payer: Self-pay | Admitting: *Deleted

## 2020-03-30 DIAGNOSIS — Z87891 Personal history of nicotine dependence: Secondary | ICD-10-CM

## 2020-03-30 NOTE — Telephone Encounter (Signed)
Received referral for initial lung cancer screening scan. Contacted patient and obtained smoking history,(current, 63 pack year) as well as answering questions related to screening process. Patient denies signs of lung cancer such as weight loss or hemoptysis. Patient denies comorbidity that would prevent curative treatment if lung cancer were found. Patient is scheduled for shared decision making visit on 04/05/20 and CT on 04/06/20.

## 2020-04-05 ENCOUNTER — Inpatient Hospital Stay: Payer: Medicare Other | Attending: Oncology | Admitting: Hospice and Palliative Medicine

## 2020-04-05 DIAGNOSIS — F17210 Nicotine dependence, cigarettes, uncomplicated: Secondary | ICD-10-CM

## 2020-04-05 DIAGNOSIS — Z122 Encounter for screening for malignant neoplasm of respiratory organs: Secondary | ICD-10-CM

## 2020-04-05 DIAGNOSIS — Z87891 Personal history of nicotine dependence: Secondary | ICD-10-CM

## 2020-04-05 NOTE — Progress Notes (Signed)
In accordance with CMS guidelines, patient has met eligibility criteria including age, absence of signs or symptoms of lung cancer.  Social History   Tobacco Use  . Smoking status: Current Every Day Smoker    Packs/day: 1.00    Years: 57.00    Pack years: 57.00    Types: Cigarettes  . Smokeless tobacco: Never Used  Substance Use Topics  . Alcohol use: Yes    Alcohol/week: 7.0 standard drinks    Types: 7 Glasses of wine per week  . Drug use: No      A shared decision-making session was conducted prior to the performance of CT scan. This includes one or more decision aids, includes benefits and harms of screening, follow-up diagnostic testing, over-diagnosis, false positive rate, and total radiation exposure.   Counseling on the importance of adherence to annual lung cancer LDCT screening, impact of co-morbidities, and ability or willingness to undergo diagnosis and treatment is imperative for compliance of the program.   Counseling on the importance of continued smoking cessation for former smokers; the importance of smoking cessation for current smokers, and information about tobacco cessation interventions have been given to patient including [place] and 1800 quit [place] programs.   Written order for lung cancer screening with LDCT has been given to the patient and any and all questions have been answered to the best of my abilities.    Yearly follow up will be coordinated by Burgess Estelle, Thoracic Navigator.  Time Total: 15 minutes  Visit consisted of counseling and education dealing with complex health screening. Greater than 50%  of this time was spent counseling and coordinating care related to the above assessment and plan.  Signed by: Altha Harm, PhD, NP-C [phone] (Work Cell)

## 2020-04-06 ENCOUNTER — Other Ambulatory Visit: Payer: Self-pay

## 2020-04-06 ENCOUNTER — Telehealth: Payer: Self-pay | Admitting: *Deleted

## 2020-04-06 ENCOUNTER — Ambulatory Visit
Admission: RE | Admit: 2020-04-06 | Discharge: 2020-04-06 | Disposition: A | Discharge status: Home / Self Care | Payer: Medicare Other | Source: Ambulatory Visit | Attending: Oncology | Admitting: Oncology

## 2020-04-06 DIAGNOSIS — Z87891 Personal history of nicotine dependence: Secondary | ICD-10-CM | POA: Insufficient documentation

## 2020-04-06 NOTE — Telephone Encounter (Signed)
Called report  IMPRESSION: 1. Multiple irregular bilateral pulmonary nodules measuring up to 12.9 mm volume derived equivalent diameter. Larger nodules have been present for 2 years but shows slight progression. Posterior left upper lobe irregular nodule measuring 7.8 mm is new in the interval. Based on larger nodules, this is Lung-RADS 4A, suspicious. Follow up low-dose chest CT without contrast in 3 months (please use the following order, "CT CHEST LCS NODULE FOLLOW-UP W/O CM") is recommended. 2.  Emphysema (ICD10-J43.9) and Aortic Atherosclerosis (ICD10-170.0)  These results will be called to the ordering clinician or representative by the Radiologist Assistant, and communication documented in the PACS or [employer].   Electronically Signed   By: Misty Stanley M.D.   On: 04/06/2020 11:25

## 2020-04-11 ENCOUNTER — Telehealth: Payer: Self-pay | Admitting: *Deleted

## 2020-04-11 NOTE — Telephone Encounter (Signed)
Notified patient of LDCT lung cancer screening program results with recommendation for 3 month follow up imaging. Also notified of incidental findings noted below and is encouraged to discuss further with PCP who will receive a copy of this note and/or the CT report. Patient verbalizes understanding.   IMPRESSION: 1. Multiple irregular bilateral pulmonary nodules measuring up to 12.9 mm volume derived equivalent diameter. Larger nodules have been present for 2 years but shows slight progression. Posterior left upper lobe irregular nodule measuring 7.8 mm is new in the interval. Based on larger nodules, this is Lung-RADS 4A, suspicious. Follow up low-dose chest CT without contrast in 3 months (please use the following order, "CT CHEST LCS NODULE FOLLOW-UP W/O CM") is recommended. 2.  Emphysema (ICD10-J43.9) and Aortic Atherosclerosis (ICD10-170.0)

## 2020-05-03 ENCOUNTER — Other Ambulatory Visit: Payer: Self-pay | Admitting: Specialist

## 2020-05-03 DIAGNOSIS — J439 Emphysema, unspecified: Secondary | ICD-10-CM

## 2020-05-03 DIAGNOSIS — J432 Centrilobular emphysema: Secondary | ICD-10-CM

## 2020-07-03 ENCOUNTER — Telehealth: Payer: Self-pay | Admitting: *Deleted

## 2020-07-03 NOTE — Telephone Encounter (Signed)
Contacted patient for scheduled LCSNodule follow up scan. Patient refuses at this time but will consider in September. Knows how to contact me when ready to schedule scan.

## 2020-07-26 ENCOUNTER — Other Ambulatory Visit: Payer: Self-pay | Admitting: Family Medicine

## 2020-07-26 DIAGNOSIS — Z1231 Encounter for screening mammogram for malignant neoplasm of breast: Secondary | ICD-10-CM

## 2020-08-30 ENCOUNTER — Other Ambulatory Visit: Payer: Self-pay

## 2020-08-30 ENCOUNTER — Ambulatory Visit
Admission: RE | Admit: 2020-08-30 | Discharge: 2020-08-30 | Disposition: A | Discharge status: Home / Self Care | Payer: Medicare Other | Source: Ambulatory Visit | Attending: Family Medicine | Admitting: Family Medicine

## 2020-08-30 DIAGNOSIS — Z1231 Encounter for screening mammogram for malignant neoplasm of breast: Secondary | ICD-10-CM

## 2020-09-05 ENCOUNTER — Ambulatory Visit
Admission: RE | Admit: 2020-09-05 | Discharge: 2020-09-05 | Disposition: A | Discharge status: Home / Self Care | Payer: Medicare Other | Source: Ambulatory Visit | Attending: Specialist | Admitting: Specialist

## 2020-09-05 ENCOUNTER — Other Ambulatory Visit: Payer: Self-pay

## 2020-09-05 DIAGNOSIS — J432 Centrilobular emphysema: Secondary | ICD-10-CM | POA: Diagnosis present

## 2020-09-05 DIAGNOSIS — J439 Emphysema, unspecified: Secondary | ICD-10-CM | POA: Diagnosis present

## 2020-09-07 ENCOUNTER — Other Ambulatory Visit: Payer: Self-pay | Admitting: Specialist

## 2021-02-09 ENCOUNTER — Other Ambulatory Visit: Payer: Self-pay | Admitting: Specialist

## 2021-02-09 DIAGNOSIS — R918 Other nonspecific abnormal finding of lung field: Secondary | ICD-10-CM

## 2021-02-12 ENCOUNTER — Other Ambulatory Visit: Payer: Self-pay

## 2021-02-12 ENCOUNTER — Ambulatory Visit
Admission: RE | Admit: 2021-02-12 | Discharge: 2021-02-12 | Disposition: A | Discharge status: Home / Self Care | Payer: Medicare Other | Source: Ambulatory Visit | Attending: Specialist | Admitting: Specialist

## 2021-02-12 DIAGNOSIS — R918 Other nonspecific abnormal finding of lung field: Secondary | ICD-10-CM | POA: Diagnosis present

## 2021-02-22 ENCOUNTER — Other Ambulatory Visit: Payer: Self-pay | Admitting: Specialist

## 2021-02-22 DIAGNOSIS — R918 Other nonspecific abnormal finding of lung field: Secondary | ICD-10-CM

## 2021-03-19 ENCOUNTER — Other Ambulatory Visit: Payer: Self-pay

## 2021-03-19 ENCOUNTER — Ambulatory Visit
Admission: RE | Admit: 2021-03-19 | Discharge: 2021-03-19 | Disposition: A | Discharge status: Home / Self Care | Payer: Medicare Other | Source: Ambulatory Visit | Attending: Specialist | Admitting: Specialist

## 2021-03-19 DIAGNOSIS — J439 Emphysema, unspecified: Secondary | ICD-10-CM | POA: Insufficient documentation

## 2021-03-19 DIAGNOSIS — I700 Atherosclerosis of aorta: Secondary | ICD-10-CM | POA: Insufficient documentation

## 2021-03-19 DIAGNOSIS — R918 Other nonspecific abnormal finding of lung field: Secondary | ICD-10-CM | POA: Diagnosis present

## 2021-03-19 DIAGNOSIS — I2510 Atherosclerotic heart disease of native coronary artery without angina pectoris: Secondary | ICD-10-CM | POA: Insufficient documentation

## 2021-03-19 LAB — GLUCOSE, CAPILLARY: Glucose-Capillary: 110 mg/dL — ABNORMAL HIGH (ref 70–99)

## 2021-03-19 MED ORDER — FLUDEOXYGLUCOSE F - 18 (FDG) INJECTION
6.7000 | Freq: Once | INTRAVENOUS | Status: AC | PRN
  Administered 2021-03-19: 7.17 via INTRAVENOUS

## 2021-04-10 ENCOUNTER — Telehealth: Payer: Self-pay

## 2021-04-10 NOTE — Telephone Encounter (Signed)
I connected by phone with Margaret Stevenson and/or patient's caregiver on 04/10/2021 at 9:44 AM to discuss the potential vaccination through our Homebound vaccination initiative.   Prevaccination Checklist for COVID-19 Vaccines  1.  Are you feeling sick today? no  2.  Have you ever received a dose of a COVID-19 vaccine?  yes      If yes, which one? Moderna   How many dose of Covid-19 vaccine have your received and dates ? 3, 01/19/2020, 02/16/2020, 10/05/2020   Check all that apply: I live in a long-term care setting. no  I have been diagnosed with a medical condition(s). 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 no  I work in a long-term care facility, correctional facility, hospital, restaurant, retail setting, school, or other setting with high exposure to the public. no  4. Do you have a health condition or are you undergoing treatment that makes you moderately or severely immunocompromised? (This would include treatment for cancer or HIV, receipt of organ transplant, immunosuppressive therapy or high-dose corticosteroids, CAR-T-cell therapy, hematopoietic cell transplant [HCT], DiGeorge syndrome or Wiskott-Aldrich syndrome)  no  5. Have you received hematopoietic cell transplant (HCT) or CAR-T-cell therapies since receiving COVID-19 vaccine? no  6.  Have you ever had an allergic reaction: (This would include a severe reaction [ e.g., anaphylaxis] that required treatment with epinephrine or EpiPen or that caused you to go to the hospital.  It would also include an allergic reaction that occurred within 4 hours that caused hives, swelling, or respiratory distress, including wheezing.) A.  A previous dose of COVID-19 vaccine. no  B.  A vaccine or injectable therapy that contains multiple components, one of which is a COVID-19 vaccine component, but it is not known which component elicited the immediate reaction. no  C.  Are you allergic to  polyethylene glycol? no  D. Are you allergic to Polysorbate, which is found in some vaccines, film coated tablets and intravenous steroids?  no   7.  Have you ever had an allergic reaction to another vaccine (other than COVID-19 vaccine) or an injectable medication? (This would include a severe reaction [ e.g., anaphylaxis] that required treatment with epinephrine or EpiPen or that caused you to go to the hospital.  It would also include an allergic reaction that occurred within 4 hours that caused hives, swelling, or respiratory distress, including wheezing.)  no   8.  Have you ever had a severe allergic reaction (e.g., anaphylaxis) to something other than a component of the COVID-19 vaccine, or any vaccine or injectable medication?  This would include food, pet, venom, environmental, or oral medication allergies.  Yes, PCN  Check all that apply to you:  Am a female between ages 40 and 10 years old  no  Women 44 through 74 years of age can receive any FDA-authorized or -approved COVID-19 vaccine. However, they should be informed of the rare but increased risk of thrombosis with thrombocytopenia syndrome (TTS) after receipt of the [employer] Vaccine and the availability of other FDA-authorized and -approved COVID-19 vaccines. People who had TTS after a first dose of Janssen vaccine should not receive a subsequent dose of Janssen product    Am a female between ages 32 and 45 years old  no Males 5 through 74 years of age may receive the correct formulation of Pfizer-BioNTech COVID-19 vaccine. Males 18 and older can receive any FDA-authorized or -approved vaccine. However, people  receiving an mRNA COVID-19 vaccine, especially males 82 through 74 years of age and their parents/legal representative (when relevant), should be informed of the risk of developing myocarditis (an inflammation of the heart muscle) or pericarditis (inflammation of the lining around the heart) after receipt of an mRNA vaccine.  The risk of developing either myocarditis or pericarditis after vaccination is low, and lower than the risk of myocarditis associated with SARS-CoV-2 infection in adolescents and adults. Vaccine recipients should be counseled about the need to seek care if symptoms of myocarditis or pericarditis develop after vaccination     Have a history of myocarditis or pericarditis  no Myocarditis or pericarditis after receipt of the first dose of an mRNA COVID-19 vaccine series but before administration of the second dose  Experts advise that people who develop myocarditis or pericarditis after a dose of an mRNA COVID-19 vaccine not receive a subsequent dose of any COVID-19 vaccine, until additional safety data are available.  Administration of a subsequent dose of COVID-19 vaccine before safety data are available can be considered in certain circumstances after the episode of myocarditis or pericarditis has completely resolved. Until additional data are available, some experts recommend a Alphonsa Overall COVID-19 vaccine be considered instead of an mRNA COVID-19 vaccine. Decisions about proceeding with a subsequent dose should include a conversation between the patient, their parent/legal representative (when relevant), and their clinical team, which may include a cardiologist.    Have been treated with monoclonal antibodies or convalescent serum to prevent or treat COVID-19  no Vaccination should be offered to people regardless of history of prior symptomatic or asymptomatic SARS-CoV-2 infection. There is no recommended minimal interval between infection and vaccination.  However, vaccination should be deferred if a patient received monoclonal antibodies or convalescent serum as treatment for COVID-19 or for post-exposure prophylaxis. This is a precautionary measure until additional information becomes available, to avoid interference of the antibody treatment with vaccine-induced immune responses.  Defer COVID-19  vaccination for 30 days when a passive antibody product was used for post-exposure prophylaxis.  Defer COVID-19 vaccination for 90 days when a passive antibody product was used to treat COVID-19.     Diagnosed with Multisystem Inflammatory Syndrome (MIS-C or MIS-A) after a COVID-19 infection  no It is unknown if people with a history of MIS-C or MIS-A are at risk for a dysregulated immune response to COVID-19 vaccination.  People with a history of MIS-C or MIS-A may choose to be vaccinated. Considerations for vaccination may include:   Clinical recovery from MIS-C or MIS-A, including return to normal cardiac function   Personal risk of severe acute COVID-19 (e.g., age, underlying conditions)   High or substantial community transmission of SARS-CoV-2 and personal increased risk of reinfection.   Timing of any immunomodulatory therapies (general best practice guidelines for immunization can be consulted for more information [email or web address])   It has been 90 days or more since their diagnosis of MIS-C   Onset of MIS-C occurred before any COVID-19 vaccination   A conversation between the patient, their guardian(s), and their clinical team or a specialist may assist with COVID-19 vaccination decisions. Healthcare providers and health departments may also request a consultation from the [facility] at [email or web address].     Have a bleeding disorder  no Take a blood thinner  no As with all vaccines, any COVID-19 vaccine product may be given to these patients, if a physician familiar with the patient's bleeding risk determines that the vaccine can be administered  intramuscularly with reasonable safety.  ACIP recommends the following technique for intramuscular vaccination in patients with bleeding disorders or taking blood thinners: a fine-gauge needle (23-gauge or  smaller caliber) should be used for the vaccination, followed by firm pressure on the site, without rubbing, for at least 2 minutes.  People who regularly take aspirin or anticoagulants as part of their routine medications do not need to stop these medications prior to receipt of any COVID-19 vaccine.    Have a history of heparin-induced thrombocytopenia (HIT)  no Although the etiology of TTS associated with the Alphonsa Overall COVID-19 vaccine is unclear, it appears to be similar to another rare immune-mediated syndrome, heparin-induced thrombocytopenia (HIT). People with a history of an episode of an immune-mediated syndrome characterized by thrombosis and thrombocytopenia, such as HIT, should be offered a currently FDA-approved or FDA-authorized mRNA COVID-19 vaccine if it has been ?90 days since their TTS resolved. After 90 days, patients may be vaccinated with any currently FDA-approved or FDA-authorized COVID-19 vaccine, including Janssen COVID-19 Vaccine. However, people who developed TTS after their initial Alphonsa Overall vaccine should not receive a Janssen booster dose.  Experts believe the following factors do not make people more susceptible to TTS after receipt of the [employer]. People with these conditions can be vaccinated with any FDA-authorized or - approved COVID-19 vaccine, including the [employer] COVID-19 Vaccine:   A prior history of venous thromboembolism   Risk factors for venous thromboembolism (e.g., inherited or acquired thrombophilia including Factor V Leiden; prothrombin gene 20210A mutation; antiphospholipid syndrome; protein C, protein S or antithrombin deficiency   A prior history of other types of thromboses not associated with thrombocytopenia   Pregnancy, post-partum status, or receipt of hormonal contraceptives (e.g., combined oral contraceptives, patch, ring)   Additional recipient education materials can be found at [email or web address]  vaccines/safety/JJUpdate.html.    Am currently pregnant or breastfeeding  no Vaccination is recommended for all people aged 44 years and older, including people that are:   Pregnant   Breastfeeding   Trying to get pregnant now or who might become pregnant in the future   Pregnant, breastfeeding, and post-partum people 17 through 74 years of age should be aware of the rare risk of TTS after receipt of the Alphonsa Overall COVID-19 Vaccine and the availability of other FDA-authorized or -approved COVID-19 vaccines (i.e., mRNA vaccines).    Have received dermal fillers  no FDA-authorized or -approved COVID-19 vaccines can be administered to people who have received injectable dermal fillers who have no contraindications for vaccination.  Infrequently, these people might experience temporary swelling at or near the site of filler injection (usually the face or lips) following administration of a dose of an mRNA COVID-19 vaccine. These people should be advised to contact their healthcare provider if swelling develops at or near the site of dermal filler following vaccination.     Have a history of Guillain-Barr Syndrome (GBS)  no People with a history of GBS can receive any FDA-authorized or -approved COVID-19 vaccine. However, given the possible association between the [employer] and an increased risk of GBS, a patient with a history of GBS and their clinical team should discuss the availability of mRNA vaccines to offer protection against COVID-19. The highest risk has been observed in men aged 28-64 years with symptoms of GBS beginning within 42 days after Alphonsa Overall COVID-19 vaccination.  People who had GBS after receiving Alphonsa Overall vaccine should be made aware of the option to receive an mRNA COVID-19 vaccine booster at least  2 months (8 weeks) after the Janssen dose. However, Alphonsa Overall vaccine may be used as a booster, particularly if GBS occurred more than 42 days after vaccination or was related  to a non-vaccine factor. Prior to booster vaccination, a conversation between the patient and their clinical team may assist with decisions about use of a COVID-19 booster dose, including the timing of administration     Postvaccination Observation Times for People without Contraindications to Covid 19 Vaccination.  30 minutes:  People with a history of: A contraindication to another type of COVID-19 vaccine product (i.e., mRNA or viral vector COVID-19 vaccines)   Immediate (within 4 hours of exposure) non-severe allergic reaction to a COVID-19 vaccine or injectable therapies   Anaphylaxis due to any cause   Immediate allergic reaction of any severity to a non-COVID-19 vaccine   15 minutes: All other people  This patient is a 75 y.o. female that meets the FDA criteria to receive homebound vaccination. Patient or parent/caregiver understands they have the option to accept or refuse homebound vaccination.  Patient passed the pre-screening checklist and would like to proceed with homebound vaccination.  Based on questionnaire above, I recommend the patient be observed for 15 minutes.  There are an estimated #0 other household members/caregivers who are also interested in receiving the vaccine.    The patient has been confirmed homebound and eligible for homebound vaccination with the considerations outlined above. I will send the patient's information to our scheduling team who will reach out to schedule the patient and potential caregiver/family members for homebound vaccination.    Dan Humphreys 04/10/2021 9:44 AM

## 2021-04-18 ENCOUNTER — Ambulatory Visit: Payer: Medicare Other | Attending: Critical Care Medicine

## 2021-04-18 ENCOUNTER — Other Ambulatory Visit: Payer: Self-pay

## 2021-04-18 DIAGNOSIS — Z23 Encounter for immunization: Secondary | ICD-10-CM

## 2021-04-18 NOTE — Progress Notes (Signed)
   Covid-19 Vaccination Clinic  Name:  Margaret Stevenson    MRN: [id number] DOB: 1945/12/20  04/18/2021  [name] was observed post Covid-19 immunization for 15 minutes without incident. She was provided with Vaccine Information Sheet and instruction to access the V-Safe system.   [name] was instructed to call 911 with any severe reactions post vaccine: Marland Kitchen Difficulty breathing  . Swelling of face and throat  . A fast heartbeat  . A bad rash all over body  . Dizziness and weakness   Immunizations Administered    Name Date Dose VIS Date Route   Moderna Covid-19 Booster Vaccine 04/18/2021  4:17 PM 0.25 mL 09/27/2020 Intramuscular   Manufacturer: Moderna   Lot: [id number]   [place]: [id number]

## 2021-07-04 ENCOUNTER — Other Ambulatory Visit: Payer: Self-pay | Admitting: Specialist

## 2021-07-04 DIAGNOSIS — R918 Other nonspecific abnormal finding of lung field: Secondary | ICD-10-CM

## 2021-08-24 ENCOUNTER — Other Ambulatory Visit: Payer: Self-pay | Admitting: Family Medicine

## 2021-08-24 DIAGNOSIS — Z1231 Encounter for screening mammogram for malignant neoplasm of breast: Secondary | ICD-10-CM

## 2021-08-31 ENCOUNTER — Ambulatory Visit
Admission: RE | Admit: 2021-08-31 | Discharge: 2021-08-31 | Disposition: A | Discharge status: Home / Self Care | Payer: Medicare Other | Source: Ambulatory Visit | Attending: Family Medicine | Admitting: Family Medicine

## 2021-08-31 ENCOUNTER — Other Ambulatory Visit: Payer: Self-pay

## 2021-08-31 DIAGNOSIS — Z1231 Encounter for screening mammogram for malignant neoplasm of breast: Secondary | ICD-10-CM | POA: Insufficient documentation

## 2021-09-06 ENCOUNTER — Ambulatory Visit: Payer: Medicare Other

## 2021-09-11 ENCOUNTER — Other Ambulatory Visit: Payer: Self-pay | Admitting: Family Medicine

## 2021-09-11 DIAGNOSIS — R928 Other abnormal and inconclusive findings on diagnostic imaging of breast: Secondary | ICD-10-CM

## 2021-09-11 DIAGNOSIS — N6489 Other specified disorders of breast: Secondary | ICD-10-CM

## 2021-09-24 ENCOUNTER — Ambulatory Visit
Admission: RE | Admit: 2021-09-24 | Discharge: 2021-09-24 | Disposition: A | Discharge status: Home / Self Care | Payer: Medicare Other | Source: Ambulatory Visit | Attending: Family Medicine | Admitting: Family Medicine

## 2021-09-24 ENCOUNTER — Other Ambulatory Visit: Payer: Self-pay

## 2021-09-24 DIAGNOSIS — R928 Other abnormal and inconclusive findings on diagnostic imaging of breast: Secondary | ICD-10-CM | POA: Diagnosis present

## 2021-09-24 DIAGNOSIS — Z23 Encounter for immunization: Secondary | ICD-10-CM

## 2021-09-24 DIAGNOSIS — N6489 Other specified disorders of breast: Secondary | ICD-10-CM | POA: Insufficient documentation

## 2021-11-03 ENCOUNTER — Emergency Department: Payer: Medicare Other

## 2021-11-03 ENCOUNTER — Emergency Department
Admission: EM | Admit: 2021-11-03 | Discharge: 2021-11-03 | Disposition: A | Discharge status: Home / Self Care | Payer: Medicare Other | Source: Home / Self Care | Attending: Emergency Medicine | Admitting: Emergency Medicine

## 2021-11-03 ENCOUNTER — Other Ambulatory Visit: Payer: Self-pay

## 2021-11-03 DIAGNOSIS — Z85820 Personal history of malignant melanoma of skin: Secondary | ICD-10-CM | POA: Insufficient documentation

## 2021-11-03 DIAGNOSIS — J449 Chronic obstructive pulmonary disease, unspecified: Secondary | ICD-10-CM | POA: Insufficient documentation

## 2021-11-03 DIAGNOSIS — R0602 Shortness of breath: Secondary | ICD-10-CM | POA: Diagnosis not present

## 2021-11-03 DIAGNOSIS — J439 Emphysema, unspecified: Secondary | ICD-10-CM | POA: Diagnosis not present

## 2021-11-03 DIAGNOSIS — Z7952 Long term (current) use of systemic steroids: Secondary | ICD-10-CM | POA: Insufficient documentation

## 2021-11-03 DIAGNOSIS — F17210 Nicotine dependence, cigarettes, uncomplicated: Secondary | ICD-10-CM | POA: Insufficient documentation

## 2021-11-03 DIAGNOSIS — J441 Chronic obstructive pulmonary disease with (acute) exacerbation: Secondary | ICD-10-CM

## 2021-11-03 DIAGNOSIS — J45909 Unspecified asthma, uncomplicated: Secondary | ICD-10-CM | POA: Insufficient documentation

## 2021-11-03 DIAGNOSIS — Z20822 Contact with and (suspected) exposure to covid-19: Secondary | ICD-10-CM | POA: Insufficient documentation

## 2021-11-03 LAB — RESP PANEL BY RT-PCR (FLU A&B, COVID) ARPGX2
Influenza A by PCR: NEGATIVE
Influenza B by PCR: NEGATIVE
SARS Coronavirus 2 by RT PCR: NEGATIVE

## 2021-11-03 LAB — CBC
HCT: 47 % — ABNORMAL HIGH (ref 36.0–46.0)
Hemoglobin: 15 g/dL (ref 12.0–15.0)
MCH: 32.1 pg (ref 26.0–34.0)
MCHC: 31.9 g/dL (ref 30.0–36.0)
MCV: 100.6 fL — ABNORMAL HIGH (ref 80.0–100.0)
Platelets: 205 10*3/uL (ref 150–400)
RBC: 4.67 MIL/uL (ref 3.87–5.11)
RDW: 13 % (ref 11.5–15.5)
WBC: 9.2 10*3/uL (ref 4.0–10.5)
nRBC: 0 % (ref 0.0–0.2)

## 2021-11-03 LAB — BRAIN NATRIURETIC PEPTIDE: B Natriuretic Peptide: 59.6 pg/mL (ref 0.0–100.0)

## 2021-11-03 LAB — BASIC METABOLIC PANEL
Anion gap: 7 (ref 5–15)
BUN: 9 mg/dL (ref 8–23)
CO2: 34 mmol/L — ABNORMAL HIGH (ref 22–32)
Calcium: 8.5 mg/dL — ABNORMAL LOW (ref 8.9–10.3)
Chloride: 92 mmol/L — ABNORMAL LOW (ref 98–111)
Creatinine, Ser: 0.54 mg/dL (ref 0.44–1.00)
GFR, Estimated: >60 mL/min (ref >60)
Glucose, Bld: 140 mg/dL — ABNORMAL HIGH (ref 70–99)
Potassium: 3.9 mmol/L (ref 3.5–5.1)
Sodium: 133 mmol/L — ABNORMAL LOW (ref 135–145)

## 2021-11-03 LAB — TROPONIN I (HIGH SENSITIVITY)
Troponin I (High Sensitivity): 5 ng/L (ref <18)
Troponin I (High Sensitivity): 5 ng/L (ref <18)

## 2021-11-03 MED ORDER — ALBUTEROL SULFATE HFA 108 (90 BASE) MCG/ACT IN AERS
2.0000 | INHALATION_SPRAY | RESPIRATORY_TRACT | Status: DC | PRN
Start: 2021-11-03 — End: 2021-11-03

## 2021-11-03 MED ORDER — ALBUTEROL SULFATE (2.5 MG/3ML) 0.083% IN NEBU: 2.5000 mg | INHALATION_SOLUTION | RESPIRATORY_TRACT | 2 refills | Status: DC | PRN

## 2021-11-03 MED ORDER — IPRATROPIUM-ALBUTEROL 0.5-2.5 (3) MG/3ML IN SOLN
3.0000 mL | Freq: Once | RESPIRATORY_TRACT | Status: AC
  Administered 2021-11-03: 3 mL via RESPIRATORY_TRACT
  Filled 2021-11-03: qty 3

## 2021-11-03 MED ORDER — IOHEXOL 350 MG/ML SOLN
75.0000 mL | Freq: Once | INTRAVENOUS | Status: AC | PRN
  Administered 2021-11-03: 75 mL via INTRAVENOUS

## 2021-11-03 MED ORDER — DOXYCYCLINE HYCLATE 50 MG PO CAPS: 100.0000 mg | ORAL_CAPSULE | Freq: Two times a day (BID) | ORAL | 0 refills | Status: DC

## 2021-11-03 MED ORDER — MAGNESIUM SULFATE 2 GM/50ML IV SOLN
2.0000 g | Freq: Once | INTRAVENOUS | Status: AC
  Administered 2021-11-03: 2 g via INTRAVENOUS
  Filled 2021-11-03: qty 50

## 2021-11-03 MED ORDER — PREDNISONE 50 MG PO TABS: 50.0000 mg | ORAL_TABLET | Freq: Every day | ORAL | 0 refills | Status: DC

## 2021-11-03 MED ORDER — METHYLPREDNISOLONE SODIUM SUCC 125 MG IJ SOLR
125.0000 mg | Freq: Once | INTRAMUSCULAR | Status: AC
  Administered 2021-11-03: 125 mg via INTRAVENOUS
  Filled 2021-11-03: qty 2

## 2021-11-03 NOTE — ED Triage Notes (Signed)
Pt reports SHOB since last night; pt reports that her sats drop to 70s with ambulation. 3L O2 at baseline. H/x COPD.

## 2021-11-03 NOTE — ED Provider Notes (Signed)
[facility] Emergency Department Provider Note  ____________________________________________  Time seen: Approximately 5:14 AM  I have reviewed the triage vital signs and the nursing notes.   HISTORY  Chief Complaint Shortness of Breath   HPI Margaret Stevenson is a 75 y.o. female with a history of COPD, chronic respiratory failure on 3 L of oxygen, melanoma who presents for evaluation of shortness of breath.  Patient reports that her shortness of breath has become progressively worsening over the last 24 hours.  She has a cough productive of clear sputum.  She reports generalized weakness but denies fever or chills, no body aches, no hemoptysis, no chest pain, no wheezing.  She denies any personal or family history of PE or DVT, no recent travel immobilization, no leg pain or swelling, no hemoptysis or exogenous hormones.  Patient does report that her oxygen dropped to 68% with ambulation while on 3 L yesterday which prompted her visit to the ER.  She has not increased her oxygen at home.   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   Elevated CA 19-9 level 05/08/2017   Arthritis 05/07/2017   Asthma without status asthmaticus 05/07/2017   Borderline diabetes 05/07/2017   Hx of adenomatous colonic polyps 05/07/2017   Tobacco dependence 05/07/2017   Sepsis ([place]) 04/27/2017   Disease of biliary tract    Obstruction of bile duct    Jaundice    Hypoxia 10/11/2016   Anxiety  10/11/2016   Acute respiratory failure ([place]) 10/06/2016   COPD (chronic obstructive pulmonary disease) ([place]) 10/06/2016   CAP (community acquired pneumonia) 10/06/2016   Hyperglycemia 10/06/2016   Vaccine counseling 06/21/2016   History of malignant melanoma of skin 08/03/2013    Past Surgical History:  Procedure Laterality Date   BREAST BIOPSY Right 08/26/2018   Affirm Bx ("x" clip) path pending   BUNIONECTOMY     COLONOSCOPY     COLONOSCOPY WITH PROPOFOL N/A 05/26/2017   Procedure: COLONOSCOPY WITH PROPOFOL;  Surgeon: Lucilla Lame, MD;  Location: [place];  Service: Endoscopy;  Laterality: N/A;  please do not move appt time   ERCP N/A 04/25/2017   Procedure: ENDOSCOPIC RETROGRADE CHOLANGIOPANCREATOGRAPHY (ERCP);  Surgeon: Lucilla Lame, MD;  Location: [facility] ENDOSCOPY;  Service: Endoscopy;  Laterality: N/A;   ERCP N/A 09/30/2017   Procedure: ENDOSCOPIC RETROGRADE CHOLANGIOPANCREATOGRAPHY (ERCP) Pancreatic stent removal;  Surgeon: Lucilla Lame, MD;  Location: [facility] ENDOSCOPY;  Service: Endoscopy;  Laterality: N/A;   POLYPECTOMY  05/26/2017   Procedure: POLYPECTOMY;  Surgeon: Lucilla Lame, MD;  Location: [place];  Service: Endoscopy;;   TONSILLECTOMY      Prior to Admission medications   Medication Sig Start Date End Date Taking? Authorizing Provider  albuterol (VENTOLIN HFA) 108 (90 Base) MCG/ACT inhaler Inhale 2 puffs into the lungs every 4 (four) hours as needed for shortness of breath.  08/27/16 01/24/19  [provider]  Fluticasone-Salmeterol (ADVAIR) 250-50 MCG/DOSE AEPB Inhale 1 puff into the lungs 2 (two) times daily.    [provider]  OXYGEN Inhale 3 L into the lungs. 1 L when not active    [provider]  tiotropium (SPIRIVA) 18 MCG inhalation capsule Place 18 mcg into inhaler and inhale at bedtime.  08/27/16 01/24/19  [provider]  TURMERIC PO Take by mouth.    [provider]     Allergies Penicillins  Family History  Problem Relation Age of Onset   Breast cancer Mother 70    Social History Social History   Tobacco Use   Smoking status: Every Day    Packs/day: 0.50    Years: 57.00    Pack years: 28.50    Types: Cigarettes   Smokeless tobacco: Never  Vaping Use   Vaping Use: Former  Substance Use Topics   Alcohol use: Yes    Alcohol/week: 7.0 standard drinks    Types: 7 Glasses of wine per week   Drug use: No    Review of Systems  Constitutional: Negative for fever. + Generalized weakness Eyes: Negative for visual changes. ENT: Negative for sore throat. Neck: No neck pain  Cardiovascular: Negative for chest pain. Respiratory: + shortness of breath and cough Gastrointestinal: Negative for abdominal pain, vomiting or diarrhea. Genitourinary: Negative for dysuria. Musculoskeletal: Negative for back pain. Skin: Negative for rash. Neurological: Negative for headaches, weakness or numbness. Psych: No SI or HI  ____________________________________________   PHYSICAL EXAM:  VITAL SIGNS: ED Triage Vitals  Enc Vitals Group     BP 11/03/21 0145 (!) 141/68     Pulse Rate 11/03/21 0145 (!) 120     Resp 11/03/21 0145 20     Temp 11/03/21 0145 98.7 F (37.1 C)     Temp Source 11/03/21 0145 Oral     SpO2 11/03/21 0145 94 %     Weight 11/03/21 0157 130 lb (59 kg)     Height 11/03/21 0157 5' 6.5" (1.689 m)     Head Circumference --      Peak Flow --      Pain Score 11/03/21 0157 4     Pain Loc --      Pain Edu? --      Excl. in [place]? --     Constitutional: Alert and oriented, chronically ill appearing. HEENT:      Head: Normocephalic and atraumatic.         Eyes: Conjunctivae are normal. Sclera is non-icteric.       Mouth/Throat: Mucous membranes are moist.       Neck: Supple with no signs of meningismus. Cardiovascular: Tachycardic with regular rhythm Respiratory: Normal respiratory effort. Lungs are clear to auscultation  bilaterally with no wheezing or crackles.  Slightly decreased air movement bilateral Gastrointestinal: Soft, non tender, and non distended with positive bowel sounds. No rebound or guarding. Genitourinary: No CVA tenderness. Musculoskeletal:  No edema, cyanosis, or erythema of extremities. Neurologic: Normal speech and language. Face is symmetric. Moving all extremities. No gross focal neurologic deficits are appreciated. Skin: Skin is warm, dry and intact. No rash noted. Psychiatric: Mood and affect are normal. Speech and behavior are normal.  ____________________________________________   LABS (all labs ordered are listed, but only abnormal results are displayed)  Labs Reviewed  CBC - Abnormal; Notable for the following components:      Result Value   HCT 47.0 (*)    MCV 100.6 (*)    All other  components within normal limits  BASIC METABOLIC PANEL - Abnormal; Notable for the following components:   Sodium 133 (*)    Chloride 92 (*)    CO2 34 (*)    Glucose, Bld 140 (*)    Calcium 8.5 (*)    All other components within normal limits  RESP PANEL BY RT-PCR (FLU A&B, COVID) ARPGX2  BRAIN NATRIURETIC PEPTIDE  TROPONIN I (HIGH SENSITIVITY)  TROPONIN I (HIGH SENSITIVITY)   ____________________________________________  EKG  ED ECG REPORT I, Rudene Re, the attending physician, personally viewed and interpreted this ECG.  Sinus tachycardia with no ST elevations or depressions, normal intervals with right axis deviation ____________________________________________  RADIOLOGY  I have personally reviewed the images performed during this visit and I agree with the Radiologist's read.   Interpretation by Radiologist:  DG Chest 2 View  Result Date: 11/03/2021 CLINICAL DATA:  Shortness of breath. EXAM: CHEST - 2 VIEW COMPARISON:  January 24, 2018 FINDINGS: The lungs are hyperinflated. Mild scarring and/or atelectasis is seen within the bilateral lung bases. A stable  calcified granuloma is seen within the mid left lung. There is no evidence of acute infiltrate, pleural effusion or pneumothorax. The heart size and mediastinal contours are within normal limits. Moderate to marked severity calcification of the aortic arch is seen. The visualized skeletal structures are unremarkable. IMPRESSION: No active cardiopulmonary disease. Electronically Signed   By: Virgina Norfolk M.D.   On: 11/03/2021 02:20     ____________________________________________   PROCEDURES  Procedure(s) performed:yes .1-3 Lead EKG Interpretation Performed by: Rudene Re, MD Authorized by: Rudene Re, MD     Interpretation: non-specific     ECG rate assessment: tachycardic     Rhythm: sinus tachycardia     Ectopy: none     Conduction: normal     Critical Care performed:  None ____________________________________________   INITIAL IMPRESSION / ASSESSMENT AND PLAN / ED COURSE  75 y.o. female with a history of COPD, chronic respiratory failure on 3 L of oxygen, melanoma who presents for evaluation of worsening shortness of breath x 24 hours associated with cough productive of clear sputum and generalized weakness.  Patient is chronically ill-appearing but in no significant respiratory distress, satting 97% on her 3 L baseline, lungs have slightly diminished air movement bilaterally but no wheezing or crackles.  She is tachycardic with a pulse in the 120s.  EKG showing sinus tachycardia with no acute ischemic changes.  Differential diagnoses including COPD exacerbation versus bronchitis versus pneumonia versus COVID versus flu versus viral syndrome versus PE versus pericardial effusion versus CHF versus pericarditis/myocarditis  Patient is afebrile with no white count and a chest x-ray that was visualized by me and read by radiologist negative for pneumonia.  We will send patient for CT angio of the chest.  No significant electrolyte derangements, no anemia.  Initial  troponin is negative. 2nd trop pending. Covid and flu pending.  Will treat with IV magnesium, IV Solu-Medrol, duo nebs.  Patient placed on telemetry for close monitoring of cardiorespiratory status.  Old medical records review including patient's last note from her pulmonologist from 5 weeks ago.  _________________________ 7:20 AM on 11/03/2021 ----------------------------------------- Repeat troponin negative.  CTA, respiratory panel pending.  Care transferred to Dr. Cheri Fowler      _____________________________________________ Please note:  Patient was evaluated in Emergency Department today for the symptoms described in the history of present illness. Patient was evaluated in the context of the global COVID-19 pandemic, which necessitated consideration that the patient  might be at risk for infection with the SARS-CoV-2 virus that causes COVID-19. Institutional protocols and algorithms that pertain to the evaluation of patients at risk for COVID-19 are in a state of rapid change based on information released by regulatory bodies including the CDC and federal and state organizations. These policies and algorithms were followed during the patient's care in the ED.  Some ED evaluations and interventions may be delayed as a result of limited staffing during the pandemic.   [place] Controlled Substance Database was reviewed by me. ____________________________________________   FINAL CLINICAL IMPRESSION(S) / ED DIAGNOSES   Final diagnoses:  Shortness of breath      NEW MEDICATIONS STARTED DURING THIS VISIT:  ED Discharge Orders     None        Note:  This document was prepared using Dragon voice recognition software and may include unintentional dictation errors.    Rudene Re, MD 11/03/21 [phone]

## 2021-11-03 NOTE — ED Notes (Addendum)
Ambulated pt in the room and hallway about 25 feet, on 3LNC which was her normal O2 requirement at home. Pt asked to turn around after 25 feet. She stated that her bathroom and living room couch was about the same distance as what we walked. Pt got back in the room and verbalized to this nurse "I am struggling to breath since that walk". This RN noted regular RR, equal chest rise, no use of accessory muscle and patient talking to this nurse in full sentences.

## 2021-11-03 NOTE — ED Provider Notes (Signed)
Emergency department handoff note  Care of this patient was signed out to me at the end of the previous provider shift.  All pertinent patient information was conveyed and all questions were answered.  Patient pending repeat troponin and CT of the chest both of which showed no acute abnormalities.  Patient was ambulated without difficulty and without desaturation with oxygen saturations at 96% throughout the entire trial of ambulation.  The patient has been reexamined and is ready to be discharged.  All diagnostic results have been reviewed and discussed with the patient/family.  Care plan has been outlined and the patient/family understands all current diagnoses, results, and treatment plans.  There are no new complaints, changes, or physical findings at this time.  All questions have been addressed and answered.  All medications, if any, that were given while in the emergency department or any that are being prescribed have been reviewed with the patient/family.  All side effects and adverse reactions have been explained.  Patient was instructed to, and agrees to follow-up with their primary care physician as well as return to the emergency department if any new or worsening symptoms develop.   Naaman Plummer, MD 11/03/21 [phone]

## 2021-11-05 ENCOUNTER — Inpatient Hospital Stay
Admission: EM | Admit: 2021-11-05 | Discharge: 2021-11-27 | DRG: 190 | Disposition: A | Discharge status: Hospice | Payer: Medicare Other | Attending: Internal Medicine | Admitting: Internal Medicine

## 2021-11-05 ENCOUNTER — Other Ambulatory Visit: Payer: Self-pay

## 2021-11-05 ENCOUNTER — Emergency Department: Payer: Medicare Other

## 2021-11-05 DIAGNOSIS — Z20822 Contact with and (suspected) exposure to covid-19: Secondary | ICD-10-CM | POA: Diagnosis present

## 2021-11-05 DIAGNOSIS — I480 Paroxysmal atrial fibrillation: Secondary | ICD-10-CM | POA: Diagnosis present

## 2021-11-05 DIAGNOSIS — Z86010 Personal history of colonic polyps: Secondary | ICD-10-CM | POA: Diagnosis not present

## 2021-11-05 DIAGNOSIS — Z85820 Personal history of malignant melanoma of skin: Secondary | ICD-10-CM

## 2021-11-05 DIAGNOSIS — T380X5A Adverse effect of glucocorticoids and synthetic analogues, initial encounter: Secondary | ICD-10-CM | POA: Diagnosis not present

## 2021-11-05 DIAGNOSIS — I25118 Atherosclerotic heart disease of native coronary artery with other forms of angina pectoris: Secondary | ICD-10-CM | POA: Diagnosis present

## 2021-11-05 DIAGNOSIS — I471 Supraventricular tachycardia: Secondary | ICD-10-CM | POA: Diagnosis not present

## 2021-11-05 DIAGNOSIS — F17200 Nicotine dependence, unspecified, uncomplicated: Secondary | ICD-10-CM | POA: Diagnosis not present

## 2021-11-05 DIAGNOSIS — E440 Moderate protein-calorie malnutrition: Secondary | ICD-10-CM | POA: Diagnosis present

## 2021-11-05 DIAGNOSIS — Z515 Encounter for palliative care: Secondary | ICD-10-CM | POA: Diagnosis not present

## 2021-11-05 DIAGNOSIS — Z9981 Dependence on supplemental oxygen: Secondary | ICD-10-CM

## 2021-11-05 DIAGNOSIS — J439 Emphysema, unspecified: Secondary | ICD-10-CM | POA: Diagnosis present

## 2021-11-05 DIAGNOSIS — Z880 Allergy status to penicillin: Secondary | ICD-10-CM

## 2021-11-05 DIAGNOSIS — Z79899 Other long term (current) drug therapy: Secondary | ICD-10-CM | POA: Diagnosis not present

## 2021-11-05 DIAGNOSIS — K5900 Constipation, unspecified: Secondary | ICD-10-CM | POA: Diagnosis present

## 2021-11-05 DIAGNOSIS — J9811 Atelectasis: Secondary | ICD-10-CM | POA: Diagnosis not present

## 2021-11-05 DIAGNOSIS — I4891 Unspecified atrial fibrillation: Secondary | ICD-10-CM | POA: Diagnosis not present

## 2021-11-05 DIAGNOSIS — J9601 Acute respiratory failure with hypoxia: Secondary | ICD-10-CM

## 2021-11-05 DIAGNOSIS — Z8509 Personal history of malignant neoplasm of other digestive organs: Secondary | ICD-10-CM | POA: Diagnosis not present

## 2021-11-05 DIAGNOSIS — E785 Hyperlipidemia, unspecified: Secondary | ICD-10-CM | POA: Diagnosis present

## 2021-11-05 DIAGNOSIS — R0602 Shortness of breath: Secondary | ICD-10-CM

## 2021-11-05 DIAGNOSIS — Z720 Tobacco use: Secondary | ICD-10-CM | POA: Diagnosis not present

## 2021-11-05 DIAGNOSIS — J9621 Acute and chronic respiratory failure with hypoxia: Secondary | ICD-10-CM | POA: Diagnosis present

## 2021-11-05 DIAGNOSIS — Z7982 Long term (current) use of aspirin: Secondary | ICD-10-CM

## 2021-11-05 DIAGNOSIS — R0902 Hypoxemia: Secondary | ICD-10-CM | POA: Diagnosis not present

## 2021-11-05 DIAGNOSIS — E876 Hypokalemia: Secondary | ICD-10-CM | POA: Diagnosis not present

## 2021-11-05 DIAGNOSIS — J441 Chronic obstructive pulmonary disease with (acute) exacerbation: Secondary | ICD-10-CM | POA: Diagnosis not present

## 2021-11-05 DIAGNOSIS — Z66 Do not resuscitate: Secondary | ICD-10-CM | POA: Diagnosis present

## 2021-11-05 DIAGNOSIS — M1990 Unspecified osteoarthritis, unspecified site: Secondary | ICD-10-CM | POA: Diagnosis present

## 2021-11-05 DIAGNOSIS — R54 Age-related physical debility: Secondary | ICD-10-CM | POA: Diagnosis present

## 2021-11-05 DIAGNOSIS — R627 Adult failure to thrive: Secondary | ICD-10-CM | POA: Diagnosis present

## 2021-11-05 DIAGNOSIS — Z681 Body mass index (BMI) 19 or less, adult: Secondary | ICD-10-CM

## 2021-11-05 DIAGNOSIS — R9431 Abnormal electrocardiogram [ECG] [EKG]: Secondary | ICD-10-CM | POA: Diagnosis not present

## 2021-11-05 DIAGNOSIS — Z7189 Other specified counseling: Secondary | ICD-10-CM | POA: Diagnosis not present

## 2021-11-05 LAB — BASIC METABOLIC PANEL
Anion gap: 4 — ABNORMAL LOW (ref 5–15)
BUN: 15 mg/dL (ref 8–23)
CO2: 44 mmol/L — ABNORMAL HIGH (ref 22–32)
Calcium: 8.6 mg/dL — ABNORMAL LOW (ref 8.9–10.3)
Chloride: 90 mmol/L — ABNORMAL LOW (ref 98–111)
Creatinine, Ser: 0.43 mg/dL — ABNORMAL LOW (ref 0.44–1.00)
GFR, Estimated: >60 mL/min (ref >60)
Glucose, Bld: 191 mg/dL — ABNORMAL HIGH (ref 70–99)
Potassium: 3.6 mmol/L (ref 3.5–5.1)
Sodium: 138 mmol/L (ref 135–145)

## 2021-11-05 LAB — RESP PANEL BY RT-PCR (FLU A&B, COVID) ARPGX2
Influenza A by PCR: NEGATIVE
Influenza B by PCR: NEGATIVE
SARS Coronavirus 2 by RT PCR: NEGATIVE

## 2021-11-05 LAB — CBC
HCT: 50.6 % — ABNORMAL HIGH (ref 36.0–46.0)
Hemoglobin: 15.2 g/dL — ABNORMAL HIGH (ref 12.0–15.0)
MCH: 31.9 pg (ref 26.0–34.0)
MCHC: 30 g/dL (ref 30.0–36.0)
MCV: 106.3 fL — ABNORMAL HIGH (ref 80.0–100.0)
Platelets: 205 10*3/uL (ref 150–400)
RBC: 4.76 MIL/uL (ref 3.87–5.11)
RDW: 13 % (ref 11.5–15.5)
WBC: 10.5 10*3/uL (ref 4.0–10.5)
nRBC: 0 % (ref 0.0–0.2)

## 2021-11-05 LAB — TROPONIN I (HIGH SENSITIVITY): Troponin I (High Sensitivity): 8 ng/L (ref <18)

## 2021-11-05 LAB — BRAIN NATRIURETIC PEPTIDE: B Natriuretic Peptide: 139.4 pg/mL — ABNORMAL HIGH (ref 0.0–100.0)

## 2021-11-05 MED ORDER — IPRATROPIUM-ALBUTEROL 0.5-2.5 (3) MG/3ML IN SOLN
3.0000 mL | Freq: Four times a day (QID) | RESPIRATORY_TRACT | Status: DC
  Administered 2021-11-06 (×2): 3 mL via RESPIRATORY_TRACT

## 2021-11-05 MED ORDER — ALBUTEROL SULFATE (2.5 MG/3ML) 0.083% IN NEBU
2.5000 mg | INHALATION_SOLUTION | RESPIRATORY_TRACT | Status: DC | PRN
  Administered 2021-11-06: 2.5 mg via RESPIRATORY_TRACT

## 2021-11-05 MED ORDER — ENOXAPARIN SODIUM 40 MG/0.4ML IJ SOSY
40.0000 mg | PREFILLED_SYRINGE | INTRAMUSCULAR | Status: DC
  Administered 2021-11-06 – 2021-11-07 (×2): 40 mg via SUBCUTANEOUS
  Filled 2021-11-05: qty 0.4

## 2021-11-05 MED ORDER — IPRATROPIUM-ALBUTEROL 0.5-2.5 (3) MG/3ML IN SOLN
6.0000 mL | Freq: Once | RESPIRATORY_TRACT | Status: AC
  Administered 2021-11-05: 22:00:00 6 mL via RESPIRATORY_TRACT
  Filled 2021-11-05: qty 6

## 2021-11-05 MED ORDER — METHYLPREDNISOLONE SODIUM SUCC 125 MG IJ SOLR
125.0000 mg | Freq: Once | INTRAMUSCULAR | Status: AC
  Administered 2021-11-05: 22:00:00 125 mg via INTRAVENOUS
  Filled 2021-11-05: qty 2

## 2021-11-05 MED ORDER — SODIUM CHLORIDE 0.9 % IV SOLN: INTRAVENOUS | Status: DC

## 2021-11-05 MED ORDER — METHYLPREDNISOLONE SODIUM SUCC 40 MG IJ SOLR
40.0000 mg | Freq: Two times a day (BID) | INTRAMUSCULAR | Status: AC
  Administered 2021-11-06 (×2): 40 mg via INTRAVENOUS
  Filled 2021-11-05: qty 1

## 2021-11-05 MED ORDER — PREDNISONE 20 MG PO TABS
40.0000 mg | ORAL_TABLET | Freq: Every day | ORAL | Status: DC
  Administered 2021-11-07 – 2021-11-09 (×3): 40 mg via ORAL
  Filled 2021-11-05 (×3): qty 2

## 2021-11-05 NOTE — ED Provider Notes (Signed)
[facility] Emergency Department Provider Note   ____________________________________________   Event Date/Time   First MD Initiated Contact with Patient 11/05/21 2104     (approximate)  I have reviewed the triage vital signs and the nursing notes.   HISTORY  Chief Complaint Respiratory Distress    HPI Margaret Stevenson is a 75 y.o. female who presents for worsening shortness of breath via EMS  LOCATION: Chest DURATION: 1 week prior to arrival TIMING: Worsening since onset SEVERITY: Severe QUALITY: Shortness of breath CONTEXT: Patient has history of COPD and states that she has had worsening shortness of breath and dyspnea on exertion over the past week MODIFYING FACTORS: Worsened with exertion and partially relieved with rest and supplemental oxygenation ASSOCIATED SYMPTOMS: Chest tightness   Per medical record review, patient has history of COPD not on supplemental oxygen          Past Medical History:  Diagnosis Date   Arthritis    hands   Cancer ([place])    melanoma 07/2013   COPD (chronic obstructive pulmonary disease) ([place])    Dyspnea    Jaundice 04/2017    Patient Active Problem List   Diagnosis Date Noted   COPD with acute exacerbation ([place]) 11/05/2021   Acute on chronic respiratory failure with hypoxia ([place]) 11/05/2021   Influenza A 01/24/2018   Fitting and adjustment of gastrointestinal appliance and device    Hx of colonic polyps    Benign neoplasm of cecum    Benign neoplasm of ascending colon    Benign neoplasm of transverse colon    Cancer ([place]) 05/08/2017   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   Elevated CA 19-9 level 05/08/2017   Arthritis 05/07/2017   Asthma without status asthmaticus 05/07/2017   Borderline diabetes 05/07/2017   Hx of adenomatous colonic polyps 05/07/2017   Tobacco dependence 05/07/2017   Sepsis ([place]) 04/27/2017   Disease of biliary tract    Obstruction of bile duct    Jaundice    Hypoxia  10/11/2016   Anxiety 10/11/2016   COPD (chronic obstructive pulmonary disease) ([place]) 10/06/2016   CAP (community acquired pneumonia) 10/06/2016   Hyperglycemia 10/06/2016   Vaccine counseling 06/21/2016   History of malignant melanoma of skin 08/03/2013    Past Surgical History:  Procedure Laterality Date   BREAST BIOPSY Right 08/26/2018   Affirm Bx ("x" clip) path pending   BUNIONECTOMY     COLONOSCOPY     COLONOSCOPY WITH PROPOFOL N/A 05/26/2017   Procedure: COLONOSCOPY WITH PROPOFOL;  Surgeon: Lucilla Lame, MD;  Location: [place];  Service: Endoscopy;  Laterality: N/A;  please do not move appt time   ERCP N/A 04/25/2017   Procedure: ENDOSCOPIC RETROGRADE CHOLANGIOPANCREATOGRAPHY (ERCP);  Surgeon: Lucilla Lame, MD;  Location: [facility] ENDOSCOPY;  Service: Endoscopy;  Laterality: N/A;   ERCP N/A 09/30/2017   Procedure: ENDOSCOPIC RETROGRADE CHOLANGIOPANCREATOGRAPHY (ERCP) Pancreatic stent removal;  Surgeon: Lucilla Lame, MD;  Location: [facility] ENDOSCOPY;  Service: Endoscopy;  Laterality: N/A;   POLYPECTOMY  05/26/2017   Procedure: POLYPECTOMY;  Surgeon: Lucilla Lame, MD;  Location: [place];  Service: Endoscopy;;   TONSILLECTOMY      Prior to Admission medications   Medication Sig Start Date End Date Taking? Authorizing Provider  albuterol (PROVENTIL) (2.5 MG/3ML) 0.083% nebulizer solution Take 3 mLs (2.5 mg total) by nebulization every 4 (four) hours as needed for wheezing or shortness of breath. 11/03/21 11/03/22  Naaman Plummer, MD  albuterol (VENTOLIN HFA) 108 (90 Base) MCG/ACT  inhaler Inhale 2 puffs into the lungs every 4 (four) hours as needed for shortness of breath.  08/27/16 01/24/19  [provider]  aspirin EC 81 MG tablet Take 81 mg by mouth daily. 10/11/21   [provider]  doxycycline (VIBRAMYCIN) 50 MG capsule Take 2 capsules (100 mg total) by mouth 2 (two) times daily for 5 days. 11/03/21 11/08/21  Naaman Plummer, MD   Fluticasone-Salmeterol (ADVAIR) 250-50 MCG/DOSE AEPB Inhale 1 puff into the lungs 2 (two) times daily.    [provider]  lovastatin (MEVACOR) 40 MG tablet Take 40 mg by mouth daily with supper. 10/11/21   [provider]  OXYGEN Inhale 3 L into the lungs. 1 L when not active    [provider]  predniSONE (DELTASONE) 50 MG tablet Take 1 tablet (50 mg total) by mouth daily with breakfast for 3 days. 11/03/21 11/06/21  Naaman Plummer, MD  tiotropium (SPIRIVA) 18 MCG inhalation capsule Place 18 mcg into inhaler and inhale at bedtime.  08/27/16 01/24/19  [provider]  TURMERIC PO Take by mouth.    [provider]    Allergies Penicillins  Family History  Problem Relation Age of Onset   Breast cancer Mother 64    Social History Social History   Tobacco Use   Smoking status: Every Day    Packs/day: 0.50    Years: 57.00    Pack years: 28.50    Types: Cigarettes   Smokeless tobacco: Never  Vaping Use   Vaping Use: Former  Substance Use Topics   Alcohol use: Yes    Alcohol/week: 7.0 standard drinks    Types: 7 Glasses of wine per week   Drug use: No    Review of Systems Constitutional: No fever/chills Eyes: No visual changes. ENT: No sore throat. Cardiovascular: Endorses chest pain. Respiratory: Endorses shortness of breath. Gastrointestinal: No abdominal pain.  No nausea, no vomiting.  No diarrhea. Genitourinary: Negative for dysuria. Musculoskeletal: Negative for acute arthralgias Skin: Negative for rash. Neurological: Negative for headaches, weakness/numbness/paresthesias in any extremity Psychiatric: Negative for suicidal ideation/homicidal ideation   ____________________________________________   PHYSICAL EXAM:  VITAL SIGNS: ED Triage Vitals  Enc Vitals Group     BP 11/05/21 2055 (!) 133/52     Pulse Rate 11/05/21 2055 100     Resp 11/05/21 2055 20     Temp 11/05/21 2059 97.8 F (36.6 C)     Temp Source  11/05/21 2059 Oral     SpO2 11/05/21 2055 100 %     Weight --      Height --      Head Circumference --      Peak Flow --      Pain Score 11/05/21 2054 0     Pain Loc --      Pain Edu? --      Excl. in [place]? --    Constitutional: Alert and oriented.  Ill appearing cachectic elderly Caucasian female in moderate respiratory distress. Eyes: Conjunctivae are normal. PERRL. Head: Atraumatic. Nose: No congestion/rhinnorhea. Mouth/Throat: Mucous membranes are moist. Neck: No stridor Cardiovascular: Grossly normal heart sounds.  Good peripheral circulation. Respiratory: Increased respiratory effort.  BiPAP in place.  Inspiratory and expiratory wheezes over bilateral lung fields Gastrointestinal: Soft and nontender. No distention. Musculoskeletal: No obvious deformities Neurologic:  Normal speech and language. No gross focal neurologic deficits are appreciated. Skin:  Skin is warm and dry. No rash noted. Psychiatric: Mood and affect are normal. Speech and  behavior are normal.  ____________________________________________   LABS (all labs ordered are listed, but only abnormal results are displayed)  Labs Reviewed  BASIC METABOLIC PANEL - Abnormal; Notable for the following components:      Result Value   Chloride 90 (*)    CO2 44 (*)    Glucose, Bld 191 (*)    Creatinine, Ser 0.43 (*)    Calcium 8.6 (*)    Anion gap 4 (*)    All other components within normal limits  CBC - Abnormal; Notable for the following components:   Hemoglobin 15.2 (*)    HCT 50.6 (*)    MCV 106.3 (*)    All other components within normal limits  RESP PANEL BY RT-PCR (FLU A&B, COVID) ARPGX2  BRAIN NATRIURETIC PEPTIDE  HIV ANTIBODY (ROUTINE TESTING W REFLEX)  TROPONIN I (HIGH SENSITIVITY)  TROPONIN I (HIGH SENSITIVITY)   ____________________________________________  EKG  ED ECG REPORT I, Naaman Plummer, the attending physician, personally viewed and interpreted this ECG.  Date: 11/05/2021 EKG Time:  2058 Rate: 100 Rhythm: Tachycardic sinus rhythm QRS Axis: normal Intervals: normal ST/T Wave abnormalities: normal Narrative Interpretation: Tachycardic sinus rhythm.  No evidence of acute ischemia  ____________________________________________  RADIOLOGY  ED MD interpretation: One-view portable chest x-ray shows no evidence of acute abnormalities including no pneumonia, pneumothorax, or widened mediastinum  Official radiology report(s): DG Chest Port 1 View  Result Date: 11/05/2021 CLINICAL DATA:  Shortness of breath. EXAM: PORTABLE CHEST 1 VIEW COMPARISON:  Chest radiograph dated 11/03/2021. FINDINGS: Emphysema. Bibasilar interstitial prominence, likely atelectasis/scarring. Small left upper lobe calcified granuloma. No focal consolidation, pleural effusion, or pneumothorax. The cardiac silhouette is within limits. Atherosclerotic calcification of the aorta. No acute osseous pathology. IMPRESSION: 1. No acute cardiopulmonary process. 2. Emphysema. Electronically Signed   By: Anner Crete M.D.   On: 11/05/2021 21:30    ____________________________________________   PROCEDURES  Procedure(s) performed (including Critical Care):  .1-3 Lead EKG Interpretation Performed by: Naaman Plummer, MD Authorized by: Naaman Plummer, MD     Interpretation: normal     ECG rate:  95   ECG rate assessment: normal     Rhythm: sinus rhythm     Ectopy: none     Conduction: normal    CRITICAL CARE Performed by: Naaman Plummer   Total critical care time: 31 minutes  Critical care time was exclusive of separately billable procedures and treating other patients.  Critical care was necessary to treat or prevent imminent or life-threatening deterioration.  Critical care was time spent personally by me on the following activities: development of treatment plan with patient and/or surrogate as well as nursing, discussions with consultants, evaluation of patient's response to treatment,  examination of patient, obtaining history from patient or surrogate, ordering and performing treatments and interventions, ordering and review of laboratory studies, ordering and review of radiographic studies, pulse oximetry and re-evaluation of patient's condition.  ____________________________________________   INITIAL IMPRESSION / ASSESSMENT AND PLAN / ED COURSE  As part of my medical decision making, I reviewed the following data within the electronic medical record, if available:  Nursing notes reviewed and incorporated, Labs reviewed, EKG interpreted, Old chart reviewed, Radiograph reviewed and Notes from prior ED visits reviewed and incorporated        The patient appears to be suffering from a moderate/severe exacerbation of COPD.  Based on the history, exam, CXR/EKG reviewed by me, and further workup I dont suspect any other emergent cause of this  presentation, such as pneumonia, acute coronary syndrome, congestive heart failure, pulmonary embolism, or pneumothorax.  ED Interventions: bronchodilators, steroids, antibiotics, reassess  Reassessment: After treatment, the patients shortness of breath is improving but patient is still requiring supplemental oxygenation with BiPAP  Disposition: Admit      ____________________________________________   FINAL CLINICAL IMPRESSION(S) / ED DIAGNOSES  Final diagnoses:  SOB (shortness of breath)  COPD exacerbation (HCC)  Acute respiratory failure with hypoxia [facility])     ED Discharge Orders     None        Note:  This document was prepared using Dragon voice recognition software and may include unintentional dictation errors.    Naaman Plummer, MD 11/05/21 [phone]

## 2021-11-05 NOTE — ED Triage Notes (Signed)
Pt presents to ER via ems with c/o SOB that started after being discharged from hospital 2 days ago.  Pt's O2 sats were mid 80's on RA and dropped to the 70s with ems.  Ems placed pt on CPAP which brought sats up into high 90's.  Pt has hx of COPD and is A&O x4 at this time.

## 2021-11-05 NOTE — H&P (Signed)
History and Physical    Margaret Stevenson STM:[id number] DOB: [birth year] DOA: 11/05/2021  PCP: Dion Body, MD   Patient coming from: home  I have personally briefly reviewed patient's relevant medical records in [place]  Chief Complaint: shortness of breath  HPI: Margaret Stevenson is a 75 y.o. female with medical history significant for Nicotine dependence and COPD on home oxygen at 3 L who presents to the ED for the second time in 2 days with COPD exacerbation unrelieved with home bronchodilators.  EMS recorded O2 sats in the 80s on her home flow rate and she was placed on CPAP.  History is limited due to her clinical condition.  Son is at the bedside.  She has had no chest pain, fever or chills no any nausea, vomiting, abdominal pain or diarrhea  ED course: BP 133/52 and tachycardic at 100 with respirations 21.  Sats 98% on 100% BiPAP Labs: WBC 10.5, hemoglobin 15.2, troponin 8, bicarb 44, glucose 191, otherwise unremarkable  EKG, personally viewed and interpreted: Sinus tachycardia at 100 with no acute ST-T wave changes  Chest x-ray with no acute disease.  Emphysema  Patient treated with duo nebs and Solu-Medrol but continued to have increased work of breathing.  Hospitalist consulted for admission.   Review of Systems: Unable to obtain due to clinical condition  Assessment/Plan    COPD with acute exacerbation ([place])   Acute on chronic respiratory failure with hypoxia ([place]) - Patient with increased work of breathing 80s on home flow rate, speaking in 2 and 3 word sentences - Continue BiPAP and wean as tolerated - DuoNebs scheduled and as needed - IV steroids - Antitussives    Tobacco dependence - Nicotine patch - For counseling on tobacco cessation when improved   DVT prophylaxis: Lovenox  Code Status: full code  Family Communication:  n son at bedside Disposition Plan: Back to previous home environment Consults called: none=  Status:At the time of  admission, it appears that the appropriate admission status for this patient is INPATIENT. This is judged to be reasonable and necessary in order to provide the required intensity of service to ensure the patient's safety given the presenting symptoms, physical exam findings, and initial radiographic and laboratory data in the context of their  Comorbid conditions.   Patient requires inpatient status due to high intensity of service, high risk for further deterioration and high frequency of surveillance required.   I certify that at the point of admission it is my clinical judgment that the patient will require inpatient hospital care spanning beyond 2 midnights     Physical Exam: Vitals:   11/05/21 2059 11/05/21 2130 11/05/21 2200 11/05/21 2227  BP:  (!) 132/58 (!) 153/57 (!) 153/57  Pulse:  95 99 100  Resp:  16 (!) 21 18  Temp: 97.8 F (36.6 C)     TempSrc: Oral     SpO2:  98% 99% 100%   Constitutional: Frail, undernourished appearing patient appears older than stated age, oriented x 3 .  Moderate respiratory distress HEENT:      Head: Normocephalic and atraumatic.         Eyes: PERLA, EOMI, Conjunctivae are normal. Sclera is non-icteric.       Mouth/Throat: Mucous membranes are moist.       Neck: Supple with no signs of meningismus. Cardiovascular: Tachycardic. No murmurs, gallops, or rubs. 2+ symmetrical distal pulses are present . No JVD. No  LE edema Respiratory: Respiratory effort increased, with bilateral  rhonchi.  Speaking in 2 word sentences Gastrointestinal: Soft, non tender, non distended. Positive bowel sounds.  Genitourinary: No CVA tenderness. Musculoskeletal: Nontender with normal range of motion in all extremities. No cyanosis, or erythema of extremities. Neurologic:  Face is symmetric. Moving all extremities. No gross focal neurologic deficits . Skin: Skin is warm, dry.  No rash or ulcers Psychiatric: Mood and affect are appropriate     Past Medical History:   Diagnosis Date   Arthritis    hands   Cancer ([place])    melanoma 07/2013   COPD (chronic obstructive pulmonary disease) (HCC)    Dyspnea    Jaundice 04/2017    Past Surgical History:  Procedure Laterality Date   BREAST BIOPSY Right 08/26/2018   Affirm Bx ("x" clip) path pending   BUNIONECTOMY     COLONOSCOPY     COLONOSCOPY WITH PROPOFOL N/A 05/26/2017   Procedure: COLONOSCOPY WITH PROPOFOL;  Surgeon: Lucilla Lame, MD;  Location: [place];  Service: Endoscopy;  Laterality: N/A;  please do not move appt time   ERCP N/A 04/25/2017   Procedure: ENDOSCOPIC RETROGRADE CHOLANGIOPANCREATOGRAPHY (ERCP);  Surgeon: Lucilla Lame, MD;  Location: [facility] ENDOSCOPY;  Service: Endoscopy;  Laterality: N/A;   ERCP N/A 09/30/2017   Procedure: ENDOSCOPIC RETROGRADE CHOLANGIOPANCREATOGRAPHY (ERCP) Pancreatic stent removal;  Surgeon: Lucilla Lame, MD;  Location[facility] ENDOSCOPY;  Service: Endoscopy;  Laterality: N/A;   POLYPECTOMY  05/26/2017   Procedure: POLYPECTOMY;  Surgeon: Lucilla Lame, MD;  Location: [place];  Service: Endoscopy;;   TONSILLECTOMY       reports that she has been smoking cigarettes. She has a 28.50 pack-year smoking history. She has never used smokeless tobacco. She reports current alcohol use of about 7.0 standard drinks per week. She reports that she does not use drugs.  Allergies  Allergen Reactions   Penicillins Hives    Has patient had a PCN reaction causing immediate rash, facial/tongue/throat swelling, SOB or lightheadedness with hypotension: no Has patient had a PCN reaction causing severe rash involving mucus membranes or skin necrosis: no Has patient had a PCN reaction that required hospitalization no Has patient had a PCN reaction occurring within the last 10 years: no If all of the above answers are "NO", then may proceed with Cephalosporin use.     Family History  Problem Relation Age of Onset   Breast cancer Mother 42      Prior to Admission  medications   Medication Sig Start Date End Date Taking? Authorizing Provider  albuterol (PROVENTIL) (2.5 MG/3ML) 0.083% nebulizer solution Take 3 mLs (2.5 mg total) by nebulization every 4 (four) hours as needed for wheezing or shortness of breath. 11/03/21 11/03/22  Naaman Plummer, MD  albuterol (VENTOLIN HFA) 108 (90 Base) MCG/ACT inhaler Inhale 2 puffs into the lungs every 4 (four) hours as needed for shortness of breath.  08/27/16 01/24/19  [provider]  aspirin EC 81 MG tablet Take 81 mg by mouth daily. 10/11/21   [provider]  doxycycline (VIBRAMYCIN) 50 MG capsule Take 2 capsules (100 mg total) by mouth 2 (two) times daily for 5 days. 11/03/21 11/08/21  Naaman Plummer, MD  Fluticasone-Salmeterol (ADVAIR) 250-50 MCG/DOSE AEPB Inhale 1 puff into the lungs 2 (two) times daily.    [provider]  lovastatin (MEVACOR) 40 MG tablet Take 40 mg by mouth daily with supper. 10/11/21   [provider]  OXYGEN Inhale 3 L into the lungs. 1 L when not active  [provider]  predniSONE (DELTASONE) 50 MG tablet Take 1 tablet (50 mg total) by mouth daily with breakfast for 3 days. 11/03/21 11/06/21  Naaman Plummer, MD  tiotropium (SPIRIVA) 18 MCG inhalation capsule Place 18 mcg into inhaler and inhale at bedtime.  08/27/16 01/24/19  [provider]  TURMERIC PO Take by mouth.    [provider]      Labs on Admission: I have personally reviewed following labs and imaging studies  CBC: Recent Labs  Lab 11/03/21 0251 11/05/21 2057  WBC 9.2 10.5  HGB 15.0 15.2*  HCT 47.0* 50.6*  MCV 100.6* 106.3*  PLT 205 811   Basic Metabolic Panel: Recent Labs  Lab 11/03/21 0251 11/05/21 2057  NA 133* 138  K 3.9 3.6  CL 92* 90*  CO2 34* 44*  GLUCOSE 140* 191*  BUN 9 15  CREATININE 0.54 0.43*  CALCIUM 8.5* 8.6*   GFR: Estimated Creatinine Clearance: 57.5 mL/min (A) (by C-G formula based on SCr of 0.43 mg/dL (L)). Liver Function  Tests: No results for input(s): AST, ALT, ALKPHOS, BILITOT, PROT, ALBUMIN in the last 168 hours. No results for input(s): LIPASE, AMYLASE in the last 168 hours. No results for input(s): AMMONIA in the last 168 hours. Coagulation Profile: No results for input(s): INR, PROTIME in the last 168 hours. Cardiac Enzymes: No results for input(s): CKTOTAL, CKMB, CKMBINDEX, TROPONINI in the last 168 hours. BNP (last 3 results) No results for input(s): PROBNP in the last 8760 hours. HbA1C: No results for input(s): HGBA1C in the last 72 hours. CBG: No results for input(s): GLUCAP in the last 168 hours. Lipid Profile: No results for input(s): CHOL, HDL, LDLCALC, TRIG, CHOLHDL, LDLDIRECT in the last 72 hours. Thyroid Function Tests: No results for input(s): TSH, T4TOTAL, FREET4, T3FREE, THYROIDAB in the last 72 hours. Anemia Panel: No results for input(s): VITAMINB12, FOLATE, FERRITIN, TIBC, IRON, RETICCTPCT in the last 72 hours. Urine analysis:    Component Value Date/Time   COLORURINE YELLOW (A) 04/27/2017 2015   APPEARANCEUR CLEAR (A) 04/27/2017 2015   LABSPEC 1.025 04/27/2017 2015   PHURINE 5.0 04/27/2017 2015   GLUCOSEU NEGATIVE 04/27/2017 2015   HGBUR MODERATE (A) 04/27/2017 2015   BILIRUBINUR NEGATIVE 04/27/2017 2015   KETONESUR NEGATIVE 04/27/2017 2015   PROTEINUR NEGATIVE 04/27/2017 2015   NITRITE NEGATIVE 04/27/2017 2015   LEUKOCYTESUR TRACE (A) 04/27/2017 2015    Radiological Exams on Admission: DG Chest Port 1 View  Result Date: 11/05/2021 CLINICAL DATA:  Shortness of breath. EXAM: PORTABLE CHEST 1 VIEW COMPARISON:  Chest radiograph dated 11/03/2021. FINDINGS: Emphysema. Bibasilar interstitial prominence, likely atelectasis/scarring. Small left upper lobe calcified granuloma. No focal consolidation, pleural effusion, or pneumothorax. The cardiac silhouette is within limits. Atherosclerotic calcification of the aorta. No acute osseous pathology. IMPRESSION: 1. No acute  cardiopulmonary process. 2. Emphysema. Electronically Signed   By: Anner Crete M.D.   On: 11/05/2021 21:30       Athena Masse MD [facility]   11/05/2021, 11:20 PM

## 2021-11-06 ENCOUNTER — Encounter: Payer: Self-pay | Admitting: Internal Medicine

## 2021-11-06 DIAGNOSIS — I471 Supraventricular tachycardia, unspecified: Secondary | ICD-10-CM | POA: Insufficient documentation

## 2021-11-06 DIAGNOSIS — J9621 Acute and chronic respiratory failure with hypoxia: Secondary | ICD-10-CM | POA: Diagnosis not present

## 2021-11-06 DIAGNOSIS — Z720 Tobacco use: Secondary | ICD-10-CM

## 2021-11-06 DIAGNOSIS — J441 Chronic obstructive pulmonary disease with (acute) exacerbation: Secondary | ICD-10-CM | POA: Diagnosis not present

## 2021-11-06 LAB — HIV ANTIBODY (ROUTINE TESTING W REFLEX): HIV Screen 4th Generation wRfx: NONREACTIVE

## 2021-11-06 LAB — TROPONIN I (HIGH SENSITIVITY): Troponin I (High Sensitivity): 6 ng/L (ref <18)

## 2021-11-06 MED ORDER — DILTIAZEM HCL 60 MG PO TABS
60.0000 mg | ORAL_TABLET | ORAL | Status: AC
  Administered 2021-11-06: 60 mg via ORAL

## 2021-11-06 MED ORDER — DILTIAZEM HCL 60 MG PO TABS
ORAL_TABLET | ORAL | Status: AC
  Filled 2021-11-06: qty 1

## 2021-11-06 MED ORDER — ASPIRIN EC 81 MG PO TBEC
81.0000 mg | DELAYED_RELEASE_TABLET | Freq: Every day | ORAL | Status: DC
  Administered 2021-11-06 – 2021-11-07 (×2): 81 mg via ORAL
  Filled 2021-11-06: qty 1

## 2021-11-06 MED ORDER — NICOTINE 14 MG/24HR TD PT24
14.0000 mg | MEDICATED_PATCH | Freq: Every day | TRANSDERMAL | Status: DC | PRN
  Filled 2021-11-06: qty 1

## 2021-11-06 MED ORDER — ENOXAPARIN SODIUM 40 MG/0.4ML IJ SOSY
PREFILLED_SYRINGE | INTRAMUSCULAR | Status: AC
  Filled 2021-11-06: qty 0.4

## 2021-11-06 MED ORDER — ASPIRIN EC 81 MG PO TBEC
DELAYED_RELEASE_TABLET | ORAL | Status: AC
  Filled 2021-11-06: qty 1

## 2021-11-06 MED ORDER — METHYLPREDNISOLONE SODIUM SUCC 40 MG IJ SOLR
INTRAMUSCULAR | Status: AC
  Filled 2021-11-06: qty 1

## 2021-11-06 MED ORDER — ASPIRIN 81 MG PO CHEW
CHEWABLE_TABLET | ORAL | Status: AC
  Filled 2021-11-06: qty 1

## 2021-11-06 MED ORDER — LEVALBUTEROL HCL 1.25 MG/0.5ML IN NEBU
1.2500 mg | INHALATION_SOLUTION | Freq: Four times a day (QID) | RESPIRATORY_TRACT | Status: DC
Start: 2021-11-06 — End: 2021-11-06
  Administered 2021-11-06: 1.25 mg via RESPIRATORY_TRACT

## 2021-11-06 MED ORDER — DILTIAZEM HCL 30 MG PO TABS
30.0000 mg | ORAL_TABLET | Freq: Four times a day (QID) | ORAL | Status: DC
  Administered 2021-11-06 – 2021-11-07 (×4): 30 mg via ORAL
  Filled 2021-11-06: qty 1

## 2021-11-06 MED ORDER — IPRATROPIUM-ALBUTEROL 0.5-2.5 (3) MG/3ML IN SOLN
RESPIRATORY_TRACT | Status: AC
  Filled 2021-11-06: qty 3

## 2021-11-06 MED ORDER — MOMETASONE FURO-FORMOTEROL FUM 200-5 MCG/ACT IN AERO
2.0000 | INHALATION_SPRAY | Freq: Two times a day (BID) | RESPIRATORY_TRACT | Status: DC
  Administered 2021-11-06 – 2021-11-11 (×11): 2 via RESPIRATORY_TRACT
  Filled 2021-11-06: qty 8.8

## 2021-11-06 MED ORDER — LEVALBUTEROL HCL 1.25 MG/0.5ML IN NEBU
1.2500 mg | INHALATION_SOLUTION | Freq: Four times a day (QID) | RESPIRATORY_TRACT | Status: DC
  Administered 2021-11-07 – 2021-11-16 (×37): 1.25 mg via RESPIRATORY_TRACT
  Filled 2021-11-06 (×44): qty 0.5

## 2021-11-06 MED ORDER — DILTIAZEM HCL 25 MG/5ML IV SOLN
INTRAVENOUS | Status: AC
  Filled 2021-11-06: qty 5

## 2021-11-06 MED ORDER — MAGNESIUM SULFATE 2 GM/50ML IV SOLN
2.0000 g | Freq: Once | INTRAVENOUS | Status: AC
  Administered 2021-11-06: 2 g via INTRAVENOUS

## 2021-11-06 MED ORDER — DILTIAZEM HCL 25 MG/5ML IV SOLN
10.0000 mg | INTRAVENOUS | Status: DC | PRN
  Administered 2021-11-06 – 2021-11-08 (×3): 10 mg via INTRAVENOUS
  Filled 2021-11-06 (×2): qty 5

## 2021-11-06 MED ORDER — LEVALBUTEROL HCL 1.25 MG/0.5ML IN NEBU
INHALATION_SOLUTION | RESPIRATORY_TRACT | Status: AC
  Administered 2021-11-06: 1.25 mg via RESPIRATORY_TRACT
  Filled 2021-11-06: qty 0.5

## 2021-11-06 MED ORDER — IPRATROPIUM BROMIDE 0.02 % IN SOLN
0.5000 mg | Freq: Four times a day (QID) | RESPIRATORY_TRACT | Status: DC
  Administered 2021-11-06 – 2021-11-16 (×39): 0.5 mg via RESPIRATORY_TRACT
  Filled 2021-11-06 (×39): qty 2.5

## 2021-11-06 MED ORDER — ADENOSINE 6 MG/2ML IV SOLN
INTRAVENOUS | Status: AC
  Filled 2021-11-06: qty 2

## 2021-11-06 MED ORDER — MAGNESIUM SULFATE 2 GM/50ML IV SOLN
INTRAVENOUS | Status: AC
  Filled 2021-11-06: qty 50

## 2021-11-06 MED ORDER — PRAVASTATIN SODIUM 40 MG PO TABS
40.0000 mg | ORAL_TABLET | Freq: Every day | ORAL | Status: DC
  Administered 2021-11-06 – 2021-11-26 (×21): 40 mg via ORAL
  Filled 2021-11-06 (×20): qty 1

## 2021-11-06 MED ORDER — IPRATROPIUM BROMIDE 0.02 % IN SOLN
RESPIRATORY_TRACT | Status: AC
  Administered 2021-11-06: 0.5 mg via RESPIRATORY_TRACT
  Filled 2021-11-06: qty 2.5

## 2021-11-06 NOTE — ED Notes (Signed)
Pt in bed, md at bedside, pt appears to be in a rapid a fib like rhythm/ svt, md requests adenosine 6mg , pt appears to have broken and is in and out of a sinus like rhythm. MD requests dilt.  10mg  iv dilt given.  MD decreased O2, pt now on 5L via [place].

## 2021-11-06 NOTE — ED Notes (Signed)
Darnelle Maffucci RN aware of assigned bed

## 2021-11-06 NOTE — ED Notes (Signed)
Pt in bed, son at bedside, pt talking in full sentences, pt has no requests at this time.

## 2021-11-06 NOTE — Progress Notes (Signed)
OT Cancellation Note  Patient Details Name: Margaret Stevenson MRN: [id number] DOB: February 11, 1946   Cancelled Treatment:    Reason Eval/Treat Not Completed: Medical issues which prohibited therapy;Other (comment) (chart reviewed. Per discussion with PT and chart review, team is attempting to stablize respiratory status following removal of bipap. OT will hold at this time and re-attempt as able.Shanon Payor, OTD OTR/L  11/06/21, 10:06 AM

## 2021-11-06 NOTE — Progress Notes (Signed)
Patient ID: Molli Gethers, female   DOB: [birth year], 75 y.o.   MRN: [id number] [facility] PROGRESS NOTE  Brendaly Townsel BZJ:[id number] DOB: 1946/04/23 DOA: 11/05/2021 PCP: Dion Body, MD  HPI/Subjective: Patient came in because she cannot breathe.  Feels tight in her chest when she is taking a deep breath.  While is in the room she went into SVT numerous episodes with heart rate going up to the 180s.  Objective: Vitals:   11/06/21 1200 11/06/21 1220  BP: (!) 129/96 131/63  Pulse: 84 81  Resp:  20  Temp:    SpO2: 92% 93%    Intake/Output Summary (Last 24 hours) at 11/06/2021 1326 Last data filed at 11/06/2021 1216 Gross per 24 hour  Intake 655.9 ml  Output --  Net 655.9 ml   There were no vitals filed for this visit.  ROS: Review of Systems  Respiratory:  Positive for cough and shortness of breath.   Cardiovascular:  Positive for chest pain.  Gastrointestinal:  Negative for abdominal pain, nausea and vomiting.  Exam: Physical Exam HENT:     Head: Normocephalic.     Mouth/Throat:     Pharynx: No oropharyngeal exudate.  Eyes:     General: Lids are normal.     Conjunctiva/sclera: Conjunctivae normal.     Pupils: Pupils are equal, round, and reactive to light.  Cardiovascular:     Rate and Rhythm: Regular rhythm. Tachycardia present.     Heart sounds: Normal heart sounds, S1 normal and S2 normal.  Pulmonary:     Breath sounds: Examination of the right-lower field reveals decreased breath sounds. Examination of the left-lower field reveals decreased breath sounds. Decreased breath sounds present. No wheezing, rhonchi or rales.  Abdominal:     Palpations: Abdomen is soft.     Tenderness: There is no abdominal tenderness.  Musculoskeletal:     Right lower leg: No swelling.     Left lower leg: No swelling.  Skin:    General: Skin is warm.     Findings: No rash.  Neurological:     Mental Status: She is alert and oriented to person, place, and time.       Scheduled Meds:  aspirin EC  81 mg Oral Daily   diltiazem  30 mg Oral Q6H   enoxaparin (LOVENOX) injection  40 mg Subcutaneous Q24H   ipratropium  0.5 mg Nebulization Q6H   levalbuterol  1.25 mg Nebulization Q6H   methylPREDNISolone (SOLU-MEDROL) injection  40 mg Intravenous Q12H   Followed by   Derrill Memo ON 11/07/2021] predniSONE  40 mg Oral Q breakfast   mometasone-formoterol  2 puff Inhalation BID   pravastatin  40 mg Oral q1800    Brief history.  75 year old female with past medical history of COPD, previous melanoma and pulmonary nodules.  Patient was admitted on 11/05/2021 with difficulty breathing.  Had a CT scan of the chest on 11/03/2021 after an ER visit that was negative for pulmonary embolism and showed stable pulmonary nodules.  Patient admitted with acute hypoxic respiratory failure initially requiring BiPAP and then switched over to nasal cannula 6 L.  Patient was started on steroids.  Patient also had episodes of SVT.  Assessment/Plan:  Acute on chronic hypoxic respiratory failure.  EMS recorded pulse ox in the 80s on her home oxygen.  Patient required BiPAP upon coming into the hospital.  When I saw the patient this morning she was on 6 L of oxygen.  I dialed her down to 5  L. SVT with heart rate going up to the 180s.  No response to Valsalva or carotid massage.  I asked the nursing staff to come in with adenosine.  Every time we were considering pushing the adenosine her heart rate broke.  Very quickly the heart rate did go back up.  We ended up pushing diltiazem 10 mg IV once and started on oral Cardizem.  We will get an echocardiogram.  Previous CT scan negative for pulmonary embolism.  IV magnesium ordered. COPD exacerbation.  Continue Solu-Medrol.  Change nebulizers to Xopenex and Atrovent.  Continue Dulera inhaler Tobacco abuse.  Nicotine patch as needed for nicotine craving Pulmonary nodules.  PET scan back in April consistent with benign etiology.  But recommended  following up with CAT scans     Code Status:     Code Status Orders  (From admission, onward)           Start     Ordered   11/05/21 2313  Do not attempt resuscitation (DNR)  Continuous       Question Answer Comment  In the event of cardiac or respiratory ARREST Do not call a "code blue"   In the event of cardiac or respiratory ARREST Do not perform Intubation, CPR, defibrillation or ACLS   In the event of cardiac or respiratory ARREST Use medication by any route, position, wound care, and other measures to relive pain and suffering. May use oxygen, suction and manual treatment of airway obstruction as needed for comfort.      11/05/21 2314           Code Status History     Date Active Date Inactive Code Status Order ID Comments User Context   01/24/2018 1813 01/27/2018 1633 Full Code [id number]  Idelle Crouch, MD Inpatient   04/27/2017 0341 04/28/2017 1942 Full Code [id number]  Harrie Foreman, MD Inpatient   04/18/2017 1728 04/19/2017 2058 Full Code [id number]  Velvet Bathe, MD Inpatient   10/11/2016 2316 10/18/2016 1519 Full Code [id number]  Lance Coon, MD Inpatient   10/06/2016 1907 10/10/2016 1502 Full Code [id number]  Idelle Crouch, MD ED      Family Communication: Son at the bedside Disposition Plan: Status is: Inpatient  Time spent: 35 minutes critical care time with treatment of SVT and fast heart rate.  [place]  [facility] [employer]

## 2021-11-06 NOTE — ED Notes (Signed)
Pt in beds son at bedside, pt denies pain at this time.  Pt awake, pt appears slightly sleepy, pt has no requests.

## 2021-11-06 NOTE — Progress Notes (Signed)
PT Cancellation Note  Patient Details Name: Margaret Stevenson MRN: [id number] DOB: 26-Apr-1946   Cancelled Treatment:    Reason Eval/Treat Not Completed: Other (comment);Medical issues which prohibited therapy. Consult received, chart reviewed. Per RN and chart, pt removed from bipap about 30 mins ago, attempting to stabilize respiratory status currently (on 6L). PT to hold and re-attempt as able.   Lieutenant Diego PT, DPT 9:26 AM,11/06/21

## 2021-11-06 NOTE — ED Notes (Signed)
Pt noted to be hypoxic on central monitoring.  On arrivale to pt's room, she had taken off her BiPAP.  O2 sats around 75% without mask.  BiPAP placed back on pt and pt instructed that she needs to keep mask on.  Pt appears slightly confused and is wondering how long she needs to remain on BiPAP.

## 2021-11-06 NOTE — Progress Notes (Signed)
Patient requesting to come off BIPAP at this time, patient assessed and found to still be in distress. Weaned FIO2 per patient's SPO2. Bipap remains on at this time, will reassess and wean as appropriate.

## 2021-11-06 NOTE — ED Notes (Signed)
ED TO INPATIENT HANDOFF REPORT  ED Nurse Name and Phone #: Darnelle Maffucci [phone]  J Name/Age/Gender Margaret Stevenson 75 y.o. female Room/Bed: ED01A/ED01A  Code Status   Code Status: DNR  Home/SNF/Other Home Patient oriented to: self, place, and time Is this baseline? Yes   Triage Complete: Triage complete  Chief Complaint COPD with acute exacerbation [facility]) [J44.1]  Triage Note Pt presents to ER via ems with c/o SOB that started after being discharged from hospital 2 days ago.  Pt's O2 sats were mid 80's on RA and dropped to the 70s with ems.  Ems placed pt on CPAP which brought sats up into high 90's.  Pt has hx of COPD and is A&O x4 at this time.   Allergies Allergies  Allergen Reactions   Penicillins Hives    Has patient had a PCN reaction causing immediate rash, facial/tongue/throat swelling, SOB or lightheadedness with hypotension: no Has patient had a PCN reaction causing severe rash involving mucus membranes or skin necrosis: no Has patient had a PCN reaction that required hospitalization no Has patient had a PCN reaction occurring within the last 10 years: no If all of the above answers are "NO", then may proceed with Cephalosporin use.     Level of Care/Admitting Diagnosis ED Disposition     ED Disposition  Admit   Condition  --   Comment  Hospital Area: [facility] [[id number]]  Level of Care: Progressive [102]  Admit to Progressive based on following criteria: RESPIRATORY PROBLEMS hypoxemic/hypercapnic respiratory failure that is responsive to NIPPV (BiPAP) or High Flow Nasal Cannula (6-80 lpm). Frequent assessment/intervention, no > Q2 hrs < Q4 hrs, to maintain oxygenation and pulmonary hygiene.  Covid Evaluation: Confirmed COVID Negative  Diagnosis: COPD with acute exacerbation [facility]) [[id number]]  Admitting Physician: Athena Masse [[id number]]  Attending Physician: Athena Masse [[id number]]  Estimated length of stay: past midnight tomorrow   Certification:: I certify this patient will need inpatient services for at least 2 midnights          B Medical/Surgery History Past Medical History:  Diagnosis Date   Arthritis    hands   Cancer ([place])    melanoma 07/2013   COPD (chronic obstructive pulmonary disease) ([place])    Dyspnea    Jaundice 04/2017   Past Surgical History:  Procedure Laterality Date   BREAST BIOPSY Right 08/26/2018   Affirm Bx ("x" clip) path pending   BUNIONECTOMY     COLONOSCOPY     COLONOSCOPY WITH PROPOFOL N/A 05/26/2017   Procedure: COLONOSCOPY WITH PROPOFOL;  Surgeon: Lucilla Lame, MD;  Location: [facility];  Service: Endoscopy;  Laterality: N/A;  please do not move appt time   ERCP N/A 04/25/2017   Procedure: ENDOSCOPIC RETROGRADE CHOLANGIOPANCREATOGRAPHY (ERCP);  Surgeon: Lucilla Lame, MD;  Location[facility] ENDOSCOPY;  Service: Endoscopy;  Laterality: N/A;   ERCP N/A 09/30/2017   Procedure: ENDOSCOPIC RETROGRADE CHOLANGIOPANCREATOGRAPHY (ERCP) Pancreatic stent removal;  Surgeon: Lucilla Lame, MD;  Location: [facility] ENDOSCOPY;  Service: Endoscopy;  Laterality: N/A;   POLYPECTOMY  05/26/2017   Procedure: POLYPECTOMY;  Surgeon: Lucilla Lame, MD;  Location: [facility];  Service: Endoscopy;;   TONSILLECTOMY       A IV Location/Drains/Wounds Patient Lines/Drains/Airways Status     Active Line/Drains/Airways     Name Placement date Placement time Site Days   Peripheral IV 11/05/21 20 G Left Antecubital 11/05/21  2051  Antecubital  1  Intake/Output Last 24 hours  Intake/Output Summary (Last 24 hours) at 11/06/2021 1543 Last data filed at 11/06/2021 1216 Gross per 24 hour  Intake 655.9 ml  Output --  Net 655.9 ml    Labs/Imaging Results for orders placed or performed during the hospital encounter of 11/05/21 (from the past 48 hour(s))  Basic metabolic panel     Status: Abnormal   Collection Time: 11/05/21  8:57 PM  Result Value Ref Range   Sodium 138 135 - 145  mmol/L   Potassium 3.6 3.5 - 5.1 mmol/L   Chloride 90 (L) 98 - 111 mmol/L   CO2 44 (H) 22 - 32 mmol/L   Glucose, Bld 191 (H) 70 - 99 mg/dL    Comment: Glucose reference range applies only to samples taken after fasting for at least 8 hours.   BUN 15 8 - 23 mg/dL   Creatinine, Ser 0.43 (L) 0.44 - 1.00 mg/dL   Calcium 8.6 (L) 8.9 - 10.3 mg/dL   GFR, Estimated >60 >60 mL/min    Comment: (NOTE) Calculated using the CKD-EPI Creatinine Equation (2021)    Anion gap 4 (L) 5 - 15    Comment: Performed at [facility], [address] [place], [place] [address]  CBC     Status: Abnormal   Collection Time: 11/05/21  8:57 PM  Result Value Ref Range   WBC 10.5 4.0 - 10.5 K/uL   RBC 4.76 3.87 - 5.11 MIL/uL   Hemoglobin 15.2 (H) 12.0 - 15.0 g/dL   HCT 50.6 (H) 36.0 - 46.0 %   MCV 106.3 (H) 80.0 - 100.0 fL   MCH 31.9 26.0 - 34.0 pg   MCHC 30.0 30.0 - 36.0 g/dL   RDW 13.0 11.5 - 15.5 %   Platelets 205 150 - 400 K/uL   nRBC 0.0 0.0 - 0.2 %    Comment: Performed at [facility] [place], [place] [address]  Troponin I (High Sensitivity)     Status: None   Collection Time: 11/05/21  8:57 PM  Result Value Ref Range   Troponin I (High Sensitivity) 8 <18 ng/L    Comment: (NOTE) Elevated high sensitivity troponin I (hsTnI) values and significant  changes across serial measurements may suggest ACS but many other  chronic and acute conditions are known to elevate hsTnI results.  Refer to the "Links" section for chest pain algorithms and additional  guidance. Performed at [facility], [address] [place], [place] [address]   Brain natriuretic peptide     Status: Abnormal   Collection Time: 11/05/21  9:13 PM  Result Value Ref Range   B Natriuretic Peptide 139.4 (H) 0.0 - 100.0 pg/mL    Comment: Performed at [facility], [address] [place],  [address]  Resp Panel by RT-PCR (Flu A&B, Covid) Nasopharyngeal Swab     Status: None    Collection Time: 11/05/21 10:02 PM   Specimen: Nasopharyngeal Swab; Nasopharyngeal(NP) swabs in vial transport medium  Result Value Ref Range   SARS Coronavirus 2 by RT PCR NEGATIVE NEGATIVE    Comment: (NOTE) SARS-CoV-2 target nucleic acids are NOT DETECTED.  The SARS-CoV-2 RNA is generally detectable in upper respiratory specimens during the acute phase of infection. The lowest concentration of SARS-CoV-2 viral copies this assay can detect is 138 copies/mL. A negative result does not preclude SARS-Cov-2 infection and should not be used as the sole basis for treatment or other patient management decisions. A negative result  may occur with  improper specimen collection/handling, submission of specimen other than nasopharyngeal swab, presence of viral mutation(s) within the areas targeted by this assay, and inadequate number of viral copies(<138 copies/mL). A negative result must be combined with clinical observations, patient history, and epidemiological information. The expected result is Negative.  Fact Sheet for Patients:  EntrepreneurPulse.com.au  Fact Sheet for Healthcare Providers:  IncredibleEmployment.be  This test is no t yet approved or cleared by the [place] FDA and  has been authorized for detection and/or diagnosis of SARS-CoV-2 by FDA under an Emergency Use Authorization (EUA). This EUA will remain  in effect (meaning this test can be used) for the duration of the COVID-19 declaration under Section 564(b)(1) of the Act, 21 U.S.C.section 360bbb-3(b)(1), unless the authorization is terminated  or revoked sooner.       Influenza A by PCR NEGATIVE NEGATIVE   Influenza B by PCR NEGATIVE NEGATIVE    Comment: (NOTE) The Xpert Xpress SARS-CoV-2/FLU/RSV plus assay is intended as an aid in the diagnosis of influenza from Nasopharyngeal swab specimens and should not be used as a sole basis for treatment. Nasal washings  and aspirates are unacceptable for Xpert Xpress SARS-CoV-2/FLU/RSV testing.  Fact Sheet for Patients: EntrepreneurPulse.com.au  Fact Sheet for Healthcare Providers: IncredibleEmployment.be  This test is not yet approved or cleared by the [place] FDA and has been authorized for detection and/or diagnosis of SARS-CoV-2 by FDA under an Emergency Use Authorization (EUA). This EUA will remain in effect (meaning this test can be used) for the duration of the COVID-19 declaration under Section 564(b)(1) of the Act, 21 U.S.C. section 360bbb-3(b)(1), unless the authorization is terminated or revoked.  Performed at [facility], [place], [place] [address]   Troponin I (High Sensitivity)     Status: None   Collection Time: 11/06/21  2:04 AM  Result Value Ref Range   Troponin I (High Sensitivity) 6 <18 ng/L    Comment: (NOTE) Elevated high sensitivity troponin I (hsTnI) values and significant  changes across serial measurements may suggest ACS but many other  chronic and acute conditions are known to elevate hsTnI results.  Refer to the "Links" section for chest pain algorithms and additional  guidance. Performed at [facility], [address] [place], [place] [address]   HIV Antibody (routine testing w rflx)     Status: None   Collection Time: 11/06/21  2:04 AM  Result Value Ref Range   HIV Screen 4th Generation wRfx Non Reactive Non Reactive    Comment: Performed at [facility], [place] [address], [place], [place] [address]   DG Chest Port 1 View  Result Date: 11/05/2021 CLINICAL DATA:  Shortness of breath. EXAM: PORTABLE CHEST 1 VIEW COMPARISON:  Chest radiograph dated 11/03/2021. FINDINGS: Emphysema. Bibasilar interstitial prominence, likely atelectasis/scarring. Small left upper lobe calcified granuloma. No focal consolidation, pleural effusion, or pneumothorax. The cardiac silhouette is within  limits. Atherosclerotic calcification of the aorta. No acute osseous pathology. IMPRESSION: 1. No acute cardiopulmonary process. 2. Emphysema. Electronically Signed   By: Anner Crete M.D.   On: 11/05/2021 21:30    Pending Labs Unresulted Labs (From admission, onward)     Start     Ordered   11/12/21 0500  Creatinine, serum  (enoxaparin (LOVENOX)    CrCl >/= 30 ml/min)  Weekly,   STAT     Comments: while on enoxaparin therapy    11/05/21 2314  Vitals/Pain Today's Vitals   11/06/21 1200 11/06/21 1220 11/06/21 1402 11/06/21 1539  BP: (!) 129/96 131/63 (!) 118/55 (!) 120/57  Pulse: 84 81 77 76  Resp:  20 (!) 22 (!) 24  Temp:   98.4 F (36.9 C) (!) 97.5 F (36.4 C)  TempSrc:   Oral Oral  SpO2: 92% 93% 93% 93%  PainSc:  0-No pain 0-No pain 0-No pain    Isolation Precautions No active isolations  Medications Medications  enoxaparin (LOVENOX) injection 40 mg (40 mg Subcutaneous Given 11/06/21 0824)  methylPREDNISolone sodium succinate (SOLU-MEDROL) 40 mg/mL injection 40 mg (40 mg Intravenous Given 11/06/21 0825)    Followed by  predniSONE (DELTASONE) tablet 40 mg (has no administration in time range)  albuterol (PROVENTIL) (2.5 MG/3ML) 0.083% nebulizer solution 2.5 mg (has no administration in time range)  aspirin EC tablet 81 mg (81 mg Oral Given 11/06/21 1104)  pravastatin (PRAVACHOL) tablet 40 mg (has no administration in time range)  mometasone-formoterol (DULERA) 200-5 MCG/ACT inhaler 2 puff (2 puffs Inhalation Given 11/06/21 0822)  diltiazem (CARDIZEM) tablet 30 mg (30 mg Oral Given 11/06/21 1216)  diltiazem (CARDIZEM) injection 10 mg (10 mg Intravenous Given 11/06/21 1042)  levalbuterol (XOPENEX) nebulizer solution 1.25 mg (1.25 mg Nebulization Given 11/06/21 1404)  ipratropium (ATROVENT) nebulizer solution 0.5 mg (0.5 mg Nebulization Given 11/06/21 1404)  nicotine (NICODERM CQ - dosed in mg/24 hours) patch 14 mg (has no administration in time range)   ipratropium-albuterol (DUONEB) 0.5-2.5 (3) MG/3ML nebulizer solution 6 mL (6 mLs Nebulization Given 11/05/21 2138)  methylPREDNISolone sodium succinate (SOLU-MEDROL) 125 mg/2 mL injection 125 mg (125 mg Intravenous Given 11/05/21 2138)  diltiazem (CARDIZEM) tablet 60 mg (60 mg Oral Given 11/06/21 1051)  magnesium sulfate IVPB 2 g 50 mL (0 g Intravenous Stopped 11/06/21 1216)    Mobility walks Low fall risk    R Recommendations: See Admitting Provider Note  Report given to:   Additional Notes: pt in currently on 5L O2 via [place], satting low 90s.

## 2021-11-06 NOTE — ED Notes (Signed)
Pt in bed, pt on bipap, rt at bedside, rt removed bipap and pt is placed on [place], pt getting duo neb at this time, pt satting 99% on neb

## 2021-11-07 ENCOUNTER — Inpatient Hospital Stay: Admit: 2021-11-07 | Payer: Medicare Other

## 2021-11-07 ENCOUNTER — Inpatient Hospital Stay (HOSPITAL_COMMUNITY)
Admit: 2021-11-07 | Discharge: 2021-11-07 | Disposition: A | Discharge status: Home / Self Care | Payer: Medicare Other | Attending: Internal Medicine | Admitting: Internal Medicine

## 2021-11-07 DIAGNOSIS — F17200 Nicotine dependence, unspecified, uncomplicated: Secondary | ICD-10-CM

## 2021-11-07 DIAGNOSIS — J441 Chronic obstructive pulmonary disease with (acute) exacerbation: Secondary | ICD-10-CM

## 2021-11-07 DIAGNOSIS — I480 Paroxysmal atrial fibrillation: Secondary | ICD-10-CM | POA: Diagnosis not present

## 2021-11-07 DIAGNOSIS — R9431 Abnormal electrocardiogram [ECG] [EKG]: Secondary | ICD-10-CM | POA: Diagnosis not present

## 2021-11-07 DIAGNOSIS — E440 Moderate protein-calorie malnutrition: Secondary | ICD-10-CM | POA: Insufficient documentation

## 2021-11-07 LAB — ECHOCARDIOGRAM COMPLETE
AR max vel: 3.28 cm2
AV Area VTI: 3.88 cm2
AV Area mean vel: 3.22 cm2
AV Mean grad: 3 mmHg
AV Peak grad: 5.4 mmHg
Ao pk vel: 1.16 m/s
Area-P 1/2: 2.39 cm2
MV VTI: 2.97 cm2
S' Lateral: 2.82 cm

## 2021-11-07 MED ORDER — FLUTICASONE PROPIONATE 50 MCG/ACT NA SUSP
2.0000 | Freq: Every day | NASAL | Status: DC
  Administered 2021-11-07 – 2021-11-20 (×9): 2 via NASAL
  Filled 2021-11-07: qty 16

## 2021-11-07 MED ORDER — ADULT MULTIVITAMIN W/MINERALS CH
1.0000 | ORAL_TABLET | Freq: Every day | ORAL | Status: DC
  Administered 2021-11-07 – 2021-11-27 (×20): 1 via ORAL
  Filled 2021-11-07 (×21): qty 1

## 2021-11-07 MED ORDER — DILTIAZEM HCL 30 MG PO TABS
30.0000 mg | ORAL_TABLET | Freq: Four times a day (QID) | ORAL | Status: DC
  Administered 2021-11-07 – 2021-11-08 (×4): 30 mg via ORAL
  Filled 2021-11-07 (×3): qty 1

## 2021-11-07 MED ORDER — METOPROLOL TARTRATE 25 MG PO TABS
25.0000 mg | ORAL_TABLET | Freq: Two times a day (BID) | ORAL | Status: DC
  Administered 2021-11-07: 25 mg via ORAL
  Filled 2021-11-07: qty 1

## 2021-11-07 MED ORDER — SODIUM CHLORIDE 0.9 % IV SOLN
500.0000 mg | INTRAVENOUS | Status: AC
  Administered 2021-11-07 – 2021-11-11 (×5): 500 mg via INTRAVENOUS
  Filled 2021-11-07 (×5): qty 500

## 2021-11-07 MED ORDER — APIXABAN 5 MG PO TABS
5.0000 mg | ORAL_TABLET | Freq: Two times a day (BID) | ORAL | Status: DC
  Administered 2021-11-07 – 2021-11-27 (×41): 5 mg via ORAL
  Filled 2021-11-07 (×42): qty 1

## 2021-11-07 MED ORDER — SODIUM CHLORIDE 0.9% FLUSH: 10.0000 mL | INTRAVENOUS | Status: DC | PRN

## 2021-11-07 MED ORDER — SALINE SPRAY 0.65 % NA SOLN
1.0000 | NASAL | Status: DC | PRN
  Administered 2021-11-07: 1 via NASAL
  Filled 2021-11-07: qty 44

## 2021-11-07 NOTE — Progress Notes (Signed)
PROGRESS NOTE    Margaret Stevenson   JEH:[id number]  DOB: October 31, 1946  PCP: Dion Body, MD    DOA: 11/05/2021 LOS: 2    Brief Narrative / Hospital Course to Date:   75 year old female with past medical history of COPD, previous melanoma and pulmonary nodules who was admitted on 11/05/2021 with difficulty breathing.  CT scan of the chest on 11/03/2021 after an ER visit that was negative for pulmonary embolism and showed stable pulmonary nodules.   Admitted with COPD exacerbation with acute hypoxic respiratory failure initially requiring BiPAP.  Started on steroids.  Patient also developed episodes of SVT.  Cardiology consulted.  Assessment & Plan   Principal Problem:   COPD with acute exacerbation ([place]) Active Problems:   Tobacco dependence   Acute on chronic respiratory failure with hypoxia (HCC)   Paroxysmal atrial fibrillation (HCC)   Malnutrition of moderate degree   Acute on chronic hypoxic respiratory failure - POA with shortness of breath, EMS recorded pulse ox in the 80s on her home oxygen.  Required BiPAP on arrival.   CTA chest 11/26 negative for PE. 11/30: Weaned to 5 L/min O2 today.  SVT vs paroxysmal A-fib - HR in the ED after admission was as high as 180s.  No improvement with Valsalva or carotid massage.  Adenosine was considered, but rhythm improved prior to this being pushed.  Later given IV Cardizem push with improvement. 11/30: recurrent SVT this AM --Cardiology consulted, see their recs --Follow up Echo --Started on Eliquis (CHA2DS2-VASc score 2 (age, gender)) --PO Cardizem 30 mg q6h, switch to CD before d/c   COPD exacerbation - POA.  Not wheezing today but has very poor air movement and still on 5 L/min O2. --Start Zithromax for ?bronchitis and anti-inflammatory --Continue Solu-Medrol --Continue Xopenex given tachycardia --Continue Atrovent and Dulera   Tobacco abuse - Nicotine patch PRN  Pulmonary nodules - PET scan April consistent with  benign etiology.  But recommended following up with CT scans to monitor.    DVT prophylaxis:  apixaban (ELIQUIS) tablet 5 mg   Diet:  Diet Orders (From admission, onward)     Start     Ordered   11/05/21 2313  Diet regular Room service appropriate? Yes; Fluid consistency: Thin  Diet effective now       Question Answer Comment  Room service appropriate? Yes   Fluid consistency: Thin      11/05/21 2314              Code Status: DNR   Subjective 11/07/21    Pt having nasal congestion this AM.  Reports being very weak, can't squeeze ocean saline bottle.  Son at bedside concerned she may be too weak, will need help at home, he lives in [place].  Pt denies fever/chills, CP.  SOB improved.   Disposition Plan & Communication   Status is: Inpatient  Remains inpatient appropriate because: on IV therapies and still requiring o2 supplementation above baseline.   Consults, Procedures, Significant Events   Consultants:  None  Procedures:  none  Antimicrobials:  Anti-infectives (From admission, onward)    None         Micro    Objective   Vitals:   11/07/21 1010 11/07/21 1103 11/07/21 1326 11/07/21 1622  BP:  (!) 113/56  120/62  Pulse:  70  72  Resp: (!) 21 20  20   Temp:  98.1 F (36.7 C)  98.3 F (36.8 C)  TempSrc:      SpO2:  93% 93% 94%    Intake/Output Summary (Last 24 hours) at 11/07/2021 1916 Last data filed at 11/07/2021 1853 Gross per 24 hour  Intake 540 ml  Output 200 ml  Net 340 ml   There were no vitals filed for this visit.  Physical Exam:  General exam: awake, alert, no acute distress, frail, chronically ill-appearing Respiratory system: very poor aeration, no wheezes or rhonchi, normal respiratory effort at rest on 5 L/min O2 [place]. Cardiovascular system: normal S1/S2, RRR, no pedal edema.   Gastrointestinal system: soft, NT, ND Central nervous system: A&O x3. no gross focal neurologic deficits, normal speech Skin: dry, intact, normal  temperature Psychiatry: normal mood, congruent affect, judgement and insight appear normal  Labs   Data Reviewed: I have personally reviewed following labs and imaging studies  CBC: Recent Labs  Lab 11/03/21 0251 11/05/21 2057  WBC 9.2 10.5  HGB 15.0 15.2*  HCT 47.0* 50.6*  MCV 100.6* 106.3*  PLT 205 332   Basic Metabolic Panel: Recent Labs  Lab 11/03/21 0251 11/05/21 2057  NA 133* 138  K 3.9 3.6  CL 92* 90*  CO2 34* 44*  GLUCOSE 140* 191*  BUN 9 15  CREATININE 0.54 0.43*  CALCIUM 8.5* 8.6*   GFR: Estimated Creatinine Clearance: 57.5 mL/min (A) (by C-G formula based on SCr of 0.43 mg/dL (L)). Liver Function Tests: No results for input(s): AST, ALT, ALKPHOS, BILITOT, PROT, ALBUMIN in the last 168 hours. No results for input(s): LIPASE, AMYLASE in the last 168 hours. No results for input(s): AMMONIA in the last 168 hours. Coagulation Profile: No results for input(s): INR, PROTIME in the last 168 hours. Cardiac Enzymes: No results for input(s): CKTOTAL, CKMB, CKMBINDEX, TROPONINI in the last 168 hours. BNP (last 3 results) No results for input(s): PROBNP in the last 8760 hours. HbA1C: No results for input(s): HGBA1C in the last 72 hours. CBG: No results for input(s): GLUCAP in the last 168 hours. Lipid Profile: No results for input(s): CHOL, HDL, LDLCALC, TRIG, CHOLHDL, LDLDIRECT in the last 72 hours. Thyroid Function Tests: No results for input(s): TSH, T4TOTAL, FREET4, T3FREE, THYROIDAB in the last 72 hours. Anemia Panel: No results for input(s): VITAMINB12, FOLATE, FERRITIN, TIBC, IRON, RETICCTPCT in the last 72 hours. Sepsis Labs: No results for input(s): PROCALCITON, LATICACIDVEN in the last 168 hours.  Recent Results (from the past 240 hour(s))  Resp Panel by RT-PCR (Flu A&B, Covid) Nasopharyngeal Swab     Status: None   Collection Time: 11/03/21  5:36 AM   Specimen: Nasopharyngeal Swab; Nasopharyngeal(NP) swabs in vial transport medium  Result Value Ref  Range Status   SARS Coronavirus 2 by RT PCR NEGATIVE NEGATIVE Final    Comment: (NOTE) SARS-CoV-2 target nucleic acids are NOT DETECTED.  The SARS-CoV-2 RNA is generally detectable in upper respiratory specimens during the acute phase of infection. The lowest concentration of SARS-CoV-2 viral copies this assay can detect is 138 copies/mL. A negative result does not preclude SARS-Cov-2 infection and should not be used as the sole basis for treatment or other patient management decisions. A negative result may occur with  improper specimen collection/handling, submission of specimen other than nasopharyngeal swab, presence of viral mutation(s) within the areas targeted by this assay, and inadequate number of viral copies(<138 copies/mL). A negative result must be combined with clinical observations, patient history, and epidemiological information. The expected result is Negative.  Fact Sheet for Patients:  EntrepreneurPulse.com.au  Fact Sheet for Healthcare Providers:  IncredibleEmployment.be  This test is no  t yet approved or cleared by the [place] and  has been authorized for detection and/or diagnosis of SARS-CoV-2 by FDA under an Emergency Use Authorization (EUA). This EUA will remain  in effect (meaning this test can be used) for the duration of the COVID-19 declaration under Section 564(b)(1) of the Act, 21 U.S.C.section 360bbb-3(b)(1), unless the authorization is terminated  or revoked sooner.       Influenza A by PCR NEGATIVE NEGATIVE Final   Influenza B by PCR NEGATIVE NEGATIVE Final    Comment: (NOTE) The Xpert Xpress SARS-CoV-2/FLU/RSV plus assay is intended as an aid in the diagnosis of influenza from Nasopharyngeal swab specimens and should not be used as a sole basis for treatment. Nasal washings and aspirates are unacceptable for Xpert Xpress SARS-CoV-2/FLU/RSV testing.  Fact Sheet for  Patients: EntrepreneurPulse.com.au  Fact Sheet for Healthcare Providers: IncredibleEmployment.be  This test is not yet approved or cleared by the [place] FDA and has been authorized for detection and/or diagnosis of SARS-CoV-2 by FDA under an Emergency Use Authorization (EUA). This EUA will remain in effect (meaning this test can be used) for the duration of the COVID-19 declaration under Section 564(b)(1) of the Act, 21 U.S.C. section 360bbb-3(b)(1), unless the authorization is terminated or revoked.  Performed at [facility], [address] [place], [place] [address]   Resp Panel by RT-PCR (Flu A&B, Covid) Nasopharyngeal Swab     Status: None   Collection Time: 11/05/21 10:02 PM   Specimen: Nasopharyngeal Swab; Nasopharyngeal(NP) swabs in vial transport medium  Result Value Ref Range Status   SARS Coronavirus 2 by RT PCR NEGATIVE NEGATIVE Final    Comment: (NOTE) SARS-CoV-2 target nucleic acids are NOT DETECTED.  The SARS-CoV-2 RNA is generally detectable in upper respiratory specimens during the acute phase of infection. The lowest concentration of SARS-CoV-2 viral copies this assay can detect is 138 copies/mL. A negative result does not preclude SARS-Cov-2 infection and should not be used as the sole basis for treatment or other patient management decisions. A negative result may occur with  improper specimen collection/handling, submission of specimen other than nasopharyngeal swab, presence of viral mutation(s) within the areas targeted by this assay, and inadequate number of viral copies(<138 copies/mL). A negative result must be combined with clinical observations, patient history, and epidemiological information. The expected result is Negative.  Fact Sheet for Patients:  EntrepreneurPulse.com.au  Fact Sheet for Healthcare Providers:  IncredibleEmployment.be  This test is no t  yet approved or cleared by the [place] FDA and  has been authorized for detection and/or diagnosis of SARS-CoV-2 by FDA under an Emergency Use Authorization (EUA). This EUA will remain  in effect (meaning this test can be used) for the duration of the COVID-19 declaration under Section 564(b)(1) of the Act, 21 U.S.C.section 360bbb-3(b)(1), unless the authorization is terminated  or revoked sooner.       Influenza A by PCR NEGATIVE NEGATIVE Final   Influenza B by PCR NEGATIVE NEGATIVE Final    Comment: (NOTE) The Xpert Xpress SARS-CoV-2/FLU/RSV plus assay is intended as an aid in the diagnosis of influenza from Nasopharyngeal swab specimens and should not be used as a sole basis for treatment. Nasal washings and aspirates are unacceptable for Xpert Xpress SARS-CoV-2/FLU/RSV testing.  Fact Sheet for Patients: EntrepreneurPulse.com.au  Fact Sheet for Healthcare Providers: IncredibleEmployment.be  This test is not yet approved or cleared by the [place] FDA and has been authorized for detection and/or diagnosis of SARS-CoV-2 by FDA  under an Emergency Use Authorization (EUA). This EUA will remain in effect (meaning this test can be used) for the duration of the COVID-19 declaration under Section 564(b)(1) of the Act, 21 U.S.C. section 360bbb-3(b)(1), unless the authorization is terminated or revoked.  Performed at [facility], [address] [place], [place] [address]       Imaging Studies   DG Chest [place] 1 View  Result Date: 11/05/2021 CLINICAL DATA:  Shortness of breath. EXAM: PORTABLE CHEST 1 VIEW COMPARISON:  Chest radiograph dated 11/03/2021. FINDINGS: Emphysema. Bibasilar interstitial prominence, likely atelectasis/scarring. Small left upper lobe calcified granuloma. No focal consolidation, pleural effusion, or pneumothorax. The cardiac silhouette is within limits. Atherosclerotic calcification of the aorta. No  acute osseous pathology. IMPRESSION: 1. No acute cardiopulmonary process. 2. Emphysema. Electronically Signed   By: Anner Crete M.D.   On: 11/05/2021 21:30   ECHOCARDIOGRAM COMPLETE  Result Date: 11/07/2021    ECHOCARDIOGRAM REPORT   Patient Name:   DANITA PROUD [facility] Date of Exam: 11/07/2021 Medical Rec #:  [id number]         Height:       66.5 in Accession #:    [id number]        Weight:       130.0 lb Date of Birth:  [birth year]          BSA:          1.675 m Patient Age:    72 years          BP:           141/85 mmHg Patient Gender: F                 HR:           91 bpm. Exam Location:  [facility] Procedure: 2D Echo, Color Doppler and Cardiac Doppler Indications:    R94.31 Abnormal ECG  History:        Patient has no prior history of Echocardiogram examinations.                 COPD.  Sonographer:    Charmayne Sheer Referring Phys: [id number] Loletha Grayer Diagnosing      Kate Sable MD Phys:  Sonographer Comments: Technically challenging study due to limited acoustic windows and no apical window. Image acquisition challenging due to uncooperative patient and Image acquisition challenging due to COPD. IMPRESSIONS  1. Left ventricular ejection fraction, by estimation, is 55 to 60%. Left ventricular ejection fraction by PLAX is 56 %. The left ventricle has normal function. The left ventricle has no regional wall motion abnormalities. Left ventricular diastolic parameters are indeterminate.  2. Right ventricular systolic function is normal. The right ventricular size is normal.  3. The mitral valve is normal in structure. No evidence of mitral valve regurgitation.  4. The aortic valve was not well visualized. Aortic valve regurgitation is not visualized.  5. The inferior vena cava is normal in size with greater than 50% respiratory variability, suggesting right atrial pressure of 3 mmHg. FINDINGS  Left Ventricle: Left ventricular ejection fraction, by estimation, is 55 to 60%. Left ventricular ejection fraction by  PLAX is 56 %. The left ventricle has normal function. The left ventricle has no regional wall motion abnormalities. The left ventricular internal cavity size was normal in size. There is no left ventricular hypertrophy. Left ventricular diastolic parameters are indeterminate. Right Ventricle: The right ventricular size is normal. No increase in right ventricular wall thickness. Right ventricular systolic function  is normal. Left Atrium: Left atrial size was normal in size. Right Atrium: Right atrial size was normal in size. Pericardium: There is no evidence of pericardial effusion. Mitral Valve: The mitral valve is normal in structure. No evidence of mitral valve regurgitation. MV peak gradient, 2.5 mmHg. The mean mitral valve gradient is 1.0 mmHg. Tricuspid Valve: The tricuspid valve is grossly normal. Tricuspid valve regurgitation is not demonstrated. Aortic Valve: The aortic valve was not well visualized. Aortic valve regurgitation is not visualized. Aortic valve mean gradient measures 3.0 mmHg. Aortic valve peak gradient measures 5.4 mmHg. Aortic valve area, by VTI measures 3.88 cm. Pulmonic Valve: The pulmonic valve was not well visualized. Pulmonic valve regurgitation is not visualized. Aorta: The aortic root is normal in size and structure. Venous: The inferior vena cava is normal in size with greater than 50% respiratory variability, suggesting right atrial pressure of 3 mmHg. IAS/Shunts: No atrial level shunt detected by color flow Doppler.  LEFT VENTRICLE PLAX 2D LV EF:         Left            Diastology                ventricular     LV e' medial:   6.64 cm/s                ejection        LV E/e' medial: 12.1                fraction by                PLAX is 56                %. LVIDd:         3.96 cm LVIDs:         2.82 cm LV PW:         0.92 cm LV IVS:        0.57 cm LVOT diam:     2.10 cm LV SV:         74 LV SV Index:   44 LVOT Area:     3.46 cm  LEFT ATRIUM         Index LA diam:    3.20 cm 1.91  cm/m  AORTIC VALVE                    PULMONIC VALVE AV Area (Vmax):    3.28 cm     PV Vmax:       0.90 m/s AV Area (Vmean):   3.22 cm     PV Vmean:      58.500 cm/s AV Area (VTI):     3.88 cm     PV VTI:        0.145 m AV Vmax:           116.00 cm/s  PV Peak grad:  3.2 mmHg AV Vmean:          79.000 cm/s  PV Mean grad:  2.0 mmHg AV VTI:            0.190 m AV Peak Grad:      5.4 mmHg AV Mean Grad:      3.0 mmHg LVOT Vmax:         110.00 cm/s LVOT Vmean:        73.500 cm/s LVOT VTI:          0.213 m LVOT/AV  VTI ratio: 1.12  AORTA Ao Root diam: 2.60 cm MITRAL VALVE MV Area (PHT): 2.39 cm    SHUNTS MV Area VTI:   2.97 cm    Systemic VTI:  0.21 m MV Peak grad:  2.5 mmHg    Systemic Diam: 2.10 cm MV Mean grad:  1.0 mmHg MV Vmax:       0.79 m/s MV Vmean:      45.0 cm/s MV Decel Time: 318 msec MV E velocity: 80.60 cm/s MV A velocity: 73.70 cm/s MV E/A ratio:  1.09 Kate Sable MD Electronically signed by Kate Sable MD Signature Date/Time: 11/07/2021/10:04:16 AM    Final      Medications   Scheduled Meds:  apixaban  5 mg Oral BID   diltiazem  30 mg Oral Q6H   fluticasone  2 spray Each Nare Daily   ipratropium  0.5 mg Nebulization Q6H   levalbuterol  1.25 mg Nebulization Q6H   mometasone-formoterol  2 puff Inhalation BID   multivitamin with minerals  1 tablet Oral Daily   pravastatin  40 mg Oral q1800   predniSONE  40 mg Oral Q breakfast   Continuous Infusions:     LOS: 2 days    Time spent: 30 minutes    Ezekiel Slocumb, DO [facility]  11/07/2021, 7:16 PM      If 7PM-7AM, please contact night-coverage. How to contact the [facility] Attending or Consulting provider [place] or covering provider during after hours [place], for this patient?    Check the care team in [facility] and look for a) attending/consulting [facility] provider listed and b) the [facility] team listed Log into [email or web address] and use [place]'s universal password to access. If you do not have the password, please  contact the hospital operator. Locate the [facility] provider you are looking for under [facility] and page to a number that you can be directly reached. If you still have difficulty reaching the provider, please page the [facility] (Director on Call) for the Hospitalists listed on amion for assistance.

## 2021-11-07 NOTE — Progress Notes (Signed)
*  PRELIMINARY RESULTS* Echocardiogram 2D Echocardiogram has been performed.  Margaret Stevenson 11/07/2021, 9:48 AM

## 2021-11-07 NOTE — Consult Note (Signed)
Cardiology Consultation:   Patient ID: Margaret Stevenson MRN: [id number]; DOB: Aug 01, 1946  Admit date: 11/05/2021 Date of Consult: 11/07/2021  PCP:  Dion Body, MD   [facility] Providers Cardiologist:  new- [place] rounding   Patient Profile:   Margaret Stevenson is a 75 y.o. female with a hx of COPD who is being seen 11/07/2021 for the evaluation of atrial fibrillation at the request of Dr. Arbutus Ped.  History of Present Illness:   Margaret Stevenson is a 75 year old female with history of COPD, former smoker x60 years presenting with worsening shortness of breath.  Patient states having COPD exacerbation in the past and her symptoms were similar at this time prompting her to call EMS and was brought to the hospital.  Hospital course complicated by episodes of tachycardia.  Denies any previous history of heart disease.  Patient admitted with hypoxic respiratory failure, treated with steroids and antibiotics.  IV Cardizem given x1 due to tachycardia.  Telemetry noted to be irregular in rhythm.  Initial EKG on admission showed sinus tachycardia.   Past Medical History:  Diagnosis Date   Arthritis    hands   Cancer ([place])    melanoma 07/2013   COPD (chronic obstructive pulmonary disease) (HCC)    Dyspnea    Jaundice 04/2017    Past Surgical History:  Procedure Laterality Date   BREAST BIOPSY Right 08/26/2018   Affirm Bx ("x" clip) path pending   BUNIONECTOMY     COLONOSCOPY     COLONOSCOPY WITH PROPOFOL N/A 05/26/2017   Procedure: COLONOSCOPY WITH PROPOFOL;  Surgeon: Lucilla Lame, MD;  Location: [place];  Service: Endoscopy;  Laterality: N/A;  please do not move appt time   ERCP N/A 04/25/2017   Procedure: ENDOSCOPIC RETROGRADE CHOLANGIOPANCREATOGRAPHY (ERCP);  Surgeon: Lucilla Lame, MD;  Location: [facility] ENDOSCOPY;  Service: Endoscopy;  Laterality: N/A;   ERCP N/A 09/30/2017   Procedure: ENDOSCOPIC RETROGRADE CHOLANGIOPANCREATOGRAPHY (ERCP) Pancreatic  stent removal;  Surgeon: Lucilla Lame, MD;  Location: [facility] ENDOSCOPY;  Service: Endoscopy;  Laterality: N/A;   POLYPECTOMY  05/26/2017   Procedure: POLYPECTOMY;  Surgeon: Lucilla Lame, MD;  Location: [place];  Service: Endoscopy;;   TONSILLECTOMY       Home Medications:  Prior to Admission medications   Medication Sig Start Date End Date Taking? Authorizing Provider  albuterol (PROVENTIL) (2.5 MG/3ML) 0.083% nebulizer solution Take 3 mLs (2.5 mg total) by nebulization every 4 (four) hours as needed for wheezing or shortness of breath. 11/03/21 11/03/22  Naaman Plummer, MD  albuterol (VENTOLIN HFA) 108 (90 Base) MCG/ACT inhaler Inhale 2 puffs into the lungs every 4 (four) hours as needed for shortness of breath.  08/27/16 01/24/19  [provider]  aspirin EC 81 MG tablet Take 81 mg by mouth daily. 10/11/21   [provider]  doxycycline (VIBRAMYCIN) 50 MG capsule Take 2 capsules (100 mg total) by mouth 2 (two) times daily for 5 days. 11/03/21 11/08/21  Naaman Plummer, MD  Fluticasone-Salmeterol (ADVAIR) 250-50 MCG/DOSE AEPB Inhale 1 puff into the lungs 2 (two) times daily.    [provider]  lovastatin (MEVACOR) 40 MG tablet Take 40 mg by mouth daily with supper. 10/11/21   [provider]  OXYGEN Inhale 3 L into the lungs. 1 L when not active    [provider]  tiotropium (SPIRIVA) 18 MCG inhalation capsule Place 18 mcg into inhaler and inhale at bedtime.  08/27/16 01/24/19  [provider]  TURMERIC PO Take by  mouth.    [provider]    Inpatient Medications: Scheduled Meds:  apixaban  5 mg Oral BID   aspirin EC  81 mg Oral Daily   diltiazem  30 mg Oral Q6H   fluticasone  2 spray Each Nare Daily   ipratropium  0.5 mg Nebulization Q6H   levalbuterol  1.25 mg Nebulization Q6H   mometasone-formoterol  2 puff Inhalation BID   pravastatin  40 mg Oral q1800   predniSONE  40 mg Oral Q breakfast   Continuous  Infusions:  PRN Meds: albuterol, diltiazem, nicotine, sodium chloride, sodium chloride flush  Allergies:    Allergies  Allergen Reactions   Penicillins Hives    Has patient had a PCN reaction causing immediate rash, facial/tongue/throat swelling, SOB or lightheadedness with hypotension: no Has patient had a PCN reaction causing severe rash involving mucus membranes or skin necrosis: no Has patient had a PCN reaction that required hospitalization no Has patient had a PCN reaction occurring within the last 10 years: no If all of the above answers are "NO", then may proceed with Cephalosporin use.     Social History:   Social History   Socioeconomic History   Marital status: Single    Spouse name: Not on file   Number of children: Not on file   Years of education: Not on file   Highest education level: Not on file  Occupational History   Not on file  Tobacco Use   Smoking status: Former    Packs/day: 0.50    Years: 57.00    Pack years: 28.50    Types: Cigarettes    Quit date: 11/03/2021    Years since quitting: 0.0   Smokeless tobacco: Never  Vaping Use   Vaping Use: Former  Substance and Sexual Activity   Alcohol use: Yes    Alcohol/week: 7.0 standard drinks    Types: 7 Glasses of wine per week   Drug use: No   Sexual activity: Not Currently  Other Topics Concern   Not on file  Social History Narrative   Not on file   Social Determinants of Health   Financial Resource Strain: Not on file  Food Insecurity: Not on file  Transportation Needs: Not on file  Physical Activity: Not on file  Stress: Not on file  Social Connections: Not on file  Intimate Partner Violence: Not on file    Family History:    Family History  Problem Relation Age of Onset   Breast cancer Mother 61     ROS:  Please see the history of present illness.   All other ROS reviewed and negative.     Physical Exam/Data:   Vitals:   11/07/21 0946 11/07/21 1010 11/07/21 1103 11/07/21  1326  BP: 120/70  (!) 113/56   Pulse:   70   Resp: (!) 24 (!) 21 20   Temp: 97.9 F (36.6 C)  98.1 F (36.7 C)   TempSrc: Oral     SpO2: 92%  93% 93%    Intake/Output Summary (Last 24 hours) at 11/07/2021 1520 Last data filed at 11/07/2021 1307 Gross per 24 hour  Intake 420 ml  Output 200 ml  Net 220 ml   Last 3 Weights 11/03/2021 04/06/2020 01/26/2018  Weight (lbs) 130 lb 130 lb 137 lb 12.6 oz  Weight (kg) 58.968 kg 58.968 kg 62.5 kg     There is no height or weight on file to calculate BMI.  General: Appears pale, cachectic HEENT:  normal Neck: no JVD Vascular: No carotid bruits; Distal pulses 2+ bilaterally Cardiac: Regular rate and rhythm, no murmurs Lungs: Diminished breath sounds at bases Abd: soft, nontender, no hepatomegaly  Ext: no edema Musculoskeletal:  No deformities, BUE and BLE strength normal and equal Skin: warm and dry  Neuro:  CNs 2-12 intact, no focal abnormalities noted Psych:  Normal affect   EKG:  The EKG was personally reviewed and demonstrates: Sinus tachycardia Telemetry:  Telemetry was personally reviewed and demonstrates: Sinus rhythm, paroxysmal atrial fibrillation noted  Relevant CV Studies: TTE 11/07/2021 1. Left ventricular ejection fraction, by estimation, is 55 to 60%. Left  ventricular ejection fraction by PLAX is 56 %. The left ventricle has  normal function. The left ventricle has no regional wall motion  abnormalities. Left ventricular diastolic  parameters are indeterminate.   2. Right ventricular systolic function is normal. The right ventricular  size is normal.   3. The mitral valve is normal in structure. No evidence of mitral valve  regurgitation.   4. The aortic valve was not well visualized. Aortic valve regurgitation  is not visualized.   5. The inferior vena cava is normal in size with greater than 50%  respiratory variability, suggesting right atrial pressure of 3 mmHg.  Laboratory Data:  High Sensitivity Troponin:    Recent Labs  Lab 11/03/21 0252 11/03/21 0536 11/05/21 2057 11/06/21 0204  TROPONINIHS 5 5 8 6      Chemistry Recent Labs  Lab 11/03/21 0251 11/05/21 2057  NA 133* 138  K 3.9 3.6  CL 92* 90*  CO2 34* 44*  GLUCOSE 140* 191*  BUN 9 15  CREATININE 0.54 0.43*  CALCIUM 8.5* 8.6*  GFRNONAA >60 >60  ANIONGAP 7 4*    No results for input(s): PROT, ALBUMIN, AST, ALT, ALKPHOS, BILITOT in the last 168 hours. Lipids No results for input(s): CHOL, TRIG, HDL, LABVLDL, LDLCALC, CHOLHDL in the last 168 hours.  Hematology Recent Labs  Lab 11/03/21 0251 11/05/21 2057  WBC 9.2 10.5  RBC 4.67 4.76  HGB 15.0 15.2*  HCT 47.0* 50.6*  MCV 100.6* 106.3*  MCH 32.1 31.9  MCHC 31.9 30.0  RDW 13.0 13.0  PLT 205 205   Thyroid No results for input(s): TSH, FREET4 in the last 168 hours.  BNP Recent Labs  Lab 11/03/21 0252 11/05/21 2113  BNP 59.6 139.4*    DDimer No results for input(s): DDIMER in the last 168 hours.   Radiology/Studies:  DG Chest Port 1 View  Result Date: 11/05/2021 CLINICAL DATA:  Shortness of breath. EXAM: PORTABLE CHEST 1 VIEW COMPARISON:  Chest radiograph dated 11/03/2021. FINDINGS: Emphysema. Bibasilar interstitial prominence, likely atelectasis/scarring. Small left upper lobe calcified granuloma. No focal consolidation, pleural effusion, or pneumothorax. The cardiac silhouette is within limits. Atherosclerotic calcification of the aorta. No acute osseous pathology. IMPRESSION: 1. No acute cardiopulmonary process. 2. Emphysema. Electronically Signed   By: Anner Crete M.D.   On: 11/05/2021 21:30   ECHOCARDIOGRAM COMPLETE  Result Date: 11/07/2021    ECHOCARDIOGRAM REPORT   Patient Name:   Margaret Stevenson [facility] Date of Exam: 11/07/2021 Medical Rec #:  [id number]         Height:       66.5 in Accession #:    [id number]        Weight:       130.0 lb Date of Birth:  03-May-1946          BSA:  1.675 m Patient Age:    54 years          BP:           141/85 mmHg  Patient Gender: F                 HR:           91 bpm. Exam Location:  [facility] Procedure: 2D Echo, Color Doppler and Cardiac Doppler Indications:    R94.31 Abnormal ECG  History:        Patient has no prior history of Echocardiogram examinations.                 COPD.  Sonographer:    Charmayne Sheer Referring Phys: [id number] Loletha Grayer Diagnosing      Kate Sable MD Phys:  Sonographer Comments: Technically challenging study due to limited acoustic windows and no apical window. Image acquisition challenging due to uncooperative patient and Image acquisition challenging due to COPD. IMPRESSIONS  1. Left ventricular ejection fraction, by estimation, is 55 to 60%. Left ventricular ejection fraction by PLAX is 56 %. The left ventricle has normal function. The left ventricle has no regional wall motion abnormalities. Left ventricular diastolic parameters are indeterminate.  2. Right ventricular systolic function is normal. The right ventricular size is normal.  3. The mitral valve is normal in structure. No evidence of mitral valve regurgitation.  4. The aortic valve was not well visualized. Aortic valve regurgitation is not visualized.  5. The inferior vena cava is normal in size with greater than 50% respiratory variability, suggesting right atrial pressure of 3 mmHg. FINDINGS  Left Ventricle: Left ventricular ejection fraction, by estimation, is 55 to 60%. Left ventricular ejection fraction by PLAX is 56 %. The left ventricle has normal function. The left ventricle has no regional wall motion abnormalities. The left ventricular internal cavity size was normal in size. There is no left ventricular hypertrophy. Left ventricular diastolic parameters are indeterminate. Right Ventricle: The right ventricular size is normal. No increase in right ventricular wall thickness. Right ventricular systolic function is normal. Left Atrium: Left atrial size was normal in size. Right Atrium: Right atrial size was normal in size.  Pericardium: There is no evidence of pericardial effusion. Mitral Valve: The mitral valve is normal in structure. No evidence of mitral valve regurgitation. MV peak gradient, 2.5 mmHg. The mean mitral valve gradient is 1.0 mmHg. Tricuspid Valve: The tricuspid valve is grossly normal. Tricuspid valve regurgitation is not demonstrated. Aortic Valve: The aortic valve was not well visualized. Aortic valve regurgitation is not visualized. Aortic valve mean gradient measures 3.0 mmHg. Aortic valve peak gradient measures 5.4 mmHg. Aortic valve area, by VTI measures 3.88 cm. Pulmonic Valve: The pulmonic valve was not well visualized. Pulmonic valve regurgitation is not visualized. Aorta: The aortic root is normal in size and structure. Venous: The inferior vena cava is normal in size with greater than 50% respiratory variability, suggesting right atrial pressure of 3 mmHg. IAS/Shunts: No atrial level shunt detected by color flow Doppler.  LEFT VENTRICLE PLAX 2D LV EF:         Left            Diastology                ventricular     LV e' medial:   6.64 cm/s                ejection  LV E/e' medial: 12.1                fraction by                PLAX is 56                %. LVIDd:         3.96 cm LVIDs:         2.82 cm LV PW:         0.92 cm LV IVS:        0.57 cm LVOT diam:     2.10 cm LV SV:         74 LV SV Index:   44 LVOT Area:     3.46 cm  LEFT ATRIUM         Index LA diam:    3.20 cm 1.91 cm/m  AORTIC VALVE                    PULMONIC VALVE AV Area (Vmax):    3.28 cm     PV Vmax:       0.90 m/s AV Area (Vmean):   3.22 cm     PV Vmean:      58.500 cm/s AV Area (VTI):     3.88 cm     PV VTI:        0.145 m AV Vmax:           116.00 cm/s  PV Peak grad:  3.2 mmHg AV Vmean:          79.000 cm/s  PV Mean grad:  2.0 mmHg AV VTI:            0.190 m AV Peak Grad:      5.4 mmHg AV Mean Grad:      3.0 mmHg LVOT Vmax:         110.00 cm/s LVOT Vmean:        73.500 cm/s LVOT VTI:          0.213 m LVOT/AV VTI ratio: 1.12   AORTA Ao Root diam: 2.60 cm MITRAL VALVE MV Area (PHT): 2.39 cm    SHUNTS MV Area VTI:   2.97 cm    Systemic VTI:  0.21 m MV Peak grad:  2.5 mmHg    Systemic Diam: 2.10 cm MV Mean grad:  1.0 mmHg MV Vmax:       0.79 m/s MV Vmean:      45.0 cm/s MV Decel Time: 318 msec MV E velocity: 80.60 cm/s MV A velocity: 73.70 cm/s MV E/A ratio:  1.09 Kate Sable MD Electronically signed by Kate Sable MD Signature Date/Time: 11/07/2021/10:04:16 AM    Final      Assessment and Plan:   Paroxysmal atrial fibrillation -CHA2DS2-VASc score 2 (age, gender) -Currently in sinus rhythm -Start Cardizem 30 mg every 6, consolidate to Cardizem CD prior to discharge. -Start Eliquis 5 mg twice daily -Echo with preserved EF.  2.  Tobacco use -Cessation recommended  3.  COPD, respiratory failure -Management as per medicine team.  Total encounter time 110 minutes  Greater than 50% was spent in counseling and coordination of care with the patient   Signed, Kate Sable, MD  11/07/2021 3:20 PM

## 2021-11-07 NOTE — Progress Notes (Signed)
Initial Nutrition Assessment  DOCUMENTATION CODES:   Non-severe (moderate) malnutrition in context of chronic illness  INTERVENTION:   -Ensure Enlive po BID, each supplement provides 350 kcal and 20 grams of protein (pt desires to bring in home supply) -MVI with minerals daily -Magic cup TID with meals, each supplement provides 290 kcal and 9 grams of protein   NUTRITION DIAGNOSIS:   Moderate Malnutrition related to chronic illness (COPD) as evidenced by mild fat depletion, moderate fat depletion, mild muscle depletion, moderate muscle depletion.  GOAL:   Patient will meet greater than or equal to 90% of their needs  MONITOR:   PO intake, Supplement acceptance, Labs, Weight trends, Skin, I & O's  REASON FOR ASSESSMENT:   Consult Assessment of nutrition requirement/status  ASSESSMENT:   Margaret Stevenson is a 75 y.o. female with medical history significant for Nicotine dependence and COPD on home oxygen at 3 L who presents to the ED for the second time in 2 days with COPD exacerbation unrelieved with home bronchodilators.  EMS recorded O2 sats in the 80s on her home flow rate and she was placed on CPAP.  History is limited due to her clinical condition.  Son is at the bedside.  She has had no chest pain, fever or chills no any nausea, vomiting, abdominal pain or diarrhea  Pt admitted with COPD exacerbation.   Reviewed I/O's: +956 ml x 24 hours  Spoke with pt and son at bedside. Per pt, she has experienced a general decline in health over the past year. Per pt, she has not had a great appetite due to decreased mobility and functionality ("it's hard to eat when you just sit there"). Pt shares that she mostly snacks on potato chips and mini bagels throughout the day, especially over the past few weeks when she has become very fatigued and short of breath. Per pt son, she generally consumes 2-3 small meals per day.    Per son, he has noticed a significant decline in health over the  past 2 months, in mobility and PO intake.   Pt unsure of UBW, but is certain that she has lost weight. Reviewed wt hx; wt has been stable over the past 19 months.   Discussed importance of good meal and supplement intake to promote healing. Pt amenable to Ensure, but wants to consume her home supply (son to bring in).   Medications reviewed and include prednisone.   Labs reviewed.   NUTRITION - FOCUSED PHYSICAL EXAM:  Flowsheet Row Most Recent Value  Orbital Region No depletion  Upper Arm Region Moderate depletion  Thoracic and Lumbar Region Mild depletion  Buccal Region No depletion  Temple Region No depletion  Clavicle Bone Region Severe depletion  Clavicle and Acromion Bone Region Moderate depletion  Scapular Bone Region Moderate depletion  Dorsal Hand Moderate depletion  Patellar Region Moderate depletion  Anterior Thigh Region Moderate depletion  Posterior Calf Region Moderate depletion  Edema (RD Assessment) None  Hair Reviewed  Eyes Reviewed  Mouth Reviewed  Skin Reviewed  Nails Reviewed       Diet Order:   Diet Order             Diet regular Room service appropriate? Yes; Fluid consistency: Thin  Diet effective now                   EDUCATION NEEDS:   Education needs have been addressed  Skin:  Skin Assessment: Reviewed RN Assessment  Last BM:  11/05/21  Height:   Ht Readings from Last 1 Encounters:  11/03/21 5' 6.5" (1.689 m)    Weight:   Wt Readings from Last 1 Encounters:  11/03/21 59 kg    Ideal Body Weight:  60.2 kg  BMI:  There is no height or weight on file to calculate BMI.  Estimated Nutritional Needs:   Kcal:  2050-2250  Protein:  105-120 grams  Fluid:  > 2 L    Loistine Chance, RD, LDN, [place] Registered Dietitian II Certified Diabetes Care and Education Specialist Please refer to [facility] for RD and/or RD on-call/weekend/after hours pager

## 2021-11-07 NOTE — Progress Notes (Signed)
PT Cancellation Note  Patient Details Name: Margaret Stevenson MRN: [id number] DOB: 03-05-1946   Cancelled Treatment:    Reason Eval/Treat Not Completed: Other (comment). Pt refusing PT this PM. Time spent on education on importance of mobility, exercises as able, and PT role in hospital. Pt declining, but agreeable to PT attempt tomorrow AM, her eyes remained closed throughout.   Lieutenant Diego PT, DPT 2:02 PM,11/07/21

## 2021-11-07 NOTE — Progress Notes (Signed)
OT Cancellation Note  Patient Details Name: Margaret Stevenson MRN: [id number] DOB: 1946-07-30   Cancelled Treatment:    Reason Eval/Treat Not Completed: Patient declined, no reason specified. Order received and chart reviewed. Max encouragement provided and instructed on importance of mobility. Pt declined, requesting attempt tomorrow late morning.  Dessie Coma, M.S. OTR/L  11/07/21, 3:16 PM  ascom [phone]

## 2021-11-08 DIAGNOSIS — J9621 Acute and chronic respiratory failure with hypoxia: Secondary | ICD-10-CM

## 2021-11-08 DIAGNOSIS — E440 Moderate protein-calorie malnutrition: Secondary | ICD-10-CM

## 2021-11-08 DIAGNOSIS — J441 Chronic obstructive pulmonary disease with (acute) exacerbation: Secondary | ICD-10-CM | POA: Diagnosis not present

## 2021-11-08 DIAGNOSIS — I480 Paroxysmal atrial fibrillation: Secondary | ICD-10-CM | POA: Diagnosis not present

## 2021-11-08 LAB — CBC
HCT: 44.7 % (ref 36.0–46.0)
Hemoglobin: 13.7 g/dL (ref 12.0–15.0)
MCH: 31.2 pg (ref 26.0–34.0)
MCHC: 30.6 g/dL (ref 30.0–36.0)
MCV: 101.8 fL — ABNORMAL HIGH (ref 80.0–100.0)
Platelets: 191 10*3/uL (ref 150–400)
RBC: 4.39 MIL/uL (ref 3.87–5.11)
RDW: 12.9 % (ref 11.5–15.5)
WBC: 11.4 10*3/uL — ABNORMAL HIGH (ref 4.0–10.5)
nRBC: 0 % (ref 0.0–0.2)

## 2021-11-08 LAB — BASIC METABOLIC PANEL
Anion gap: 6 (ref 5–15)
BUN: 17 mg/dL (ref 8–23)
CO2: 42 mmol/L — ABNORMAL HIGH (ref 22–32)
Calcium: 8.6 mg/dL — ABNORMAL LOW (ref 8.9–10.3)
Chloride: 91 mmol/L — ABNORMAL LOW (ref 98–111)
Creatinine, Ser: 0.41 mg/dL — ABNORMAL LOW (ref 0.44–1.00)
GFR, Estimated: >60 mL/min (ref >60)
Glucose, Bld: 143 mg/dL — ABNORMAL HIGH (ref 70–99)
Potassium: 3.2 mmol/L — ABNORMAL LOW (ref 3.5–5.1)
Sodium: 139 mmol/L (ref 135–145)

## 2021-11-08 LAB — MAGNESIUM: Magnesium: 2.3 mg/dL (ref 1.7–2.4)

## 2021-11-08 MED ORDER — BISACODYL 5 MG PO TBEC
5.0000 mg | DELAYED_RELEASE_TABLET | Freq: Every day | ORAL | Status: DC | PRN
  Administered 2021-11-08 – 2021-11-13 (×2): 5 mg via ORAL
  Filled 2021-11-08 (×3): qty 1

## 2021-11-08 MED ORDER — DILTIAZEM HCL 30 MG PO TABS
60.0000 mg | ORAL_TABLET | Freq: Four times a day (QID) | ORAL | Status: DC
  Administered 2021-11-08 – 2021-11-09 (×5): 60 mg via ORAL
  Filled 2021-11-08 (×5): qty 2

## 2021-11-08 MED ORDER — POLYETHYLENE GLYCOL 3350 17 G PO PACK
17.0000 g | PACK | Freq: Every day | ORAL | Status: DC
  Administered 2021-11-08 – 2021-11-11 (×4): 17 g via ORAL
  Filled 2021-11-08 (×4): qty 1

## 2021-11-08 MED ORDER — SENNOSIDES-DOCUSATE SODIUM 8.6-50 MG PO TABS
1.0000 | ORAL_TABLET | Freq: Two times a day (BID) | ORAL | Status: DC
  Administered 2021-11-08 – 2021-11-11 (×6): 1 via ORAL
  Filled 2021-11-08 (×6): qty 1

## 2021-11-08 MED ORDER — BISACODYL 10 MG RE SUPP
10.0000 mg | Freq: Every day | RECTAL | Status: DC | PRN
  Administered 2021-11-09 – 2021-11-17 (×5): 10 mg via RECTAL
  Filled 2021-11-08 (×5): qty 1

## 2021-11-08 MED ORDER — FLEET ENEMA 7-19 GM/118ML RE ENEM
1.0000 | ENEMA | Freq: Once | RECTAL | Status: AC | PRN
  Administered 2021-11-08: 1 via RECTAL

## 2021-11-08 MED ORDER — POTASSIUM CHLORIDE CRYS ER 20 MEQ PO TBCR
40.0000 meq | EXTENDED_RELEASE_TABLET | ORAL | Status: AC
  Administered 2021-11-08 (×2): 40 meq via ORAL
  Filled 2021-11-08 (×2): qty 2

## 2021-11-08 NOTE — Progress Notes (Addendum)
PROGRESS NOTE    Margaret Stevenson   XQJ:[id number]  DOB: 09-25-1946  PCP: Dion Body, MD    DOA: 11/05/2021 LOS: 3    Brief Narrative / Hospital Course to Date:   75 year old female with past medical history of COPD, previous melanoma and pulmonary nodules who was admitted on 11/05/2021 with difficulty breathing.  CT scan of the chest on 11/03/2021 after an ER visit that was negative for pulmonary embolism and showed stable pulmonary nodules.   Admitted with COPD exacerbation with acute hypoxic respiratory failure initially requiring BiPAP.  Started on steroids.  Patient also developed episodes of SVT.  Cardiology consulted.  Assessment & Plan   Principal Problem:   COPD with acute exacerbation ([place]) Active Problems:   Tobacco dependence   Acute on chronic respiratory failure with hypoxia (HCC)   Paroxysmal atrial fibrillation (HCC)   Malnutrition of moderate degree   Acute on chronic hypoxic respiratory failure - POA with shortness of breath, EMS recorded pulse ox in the 80s on her home oxygen.  Required BiPAP on arrival.   CTA chest 11/26 negative for PE. 11/30: Weaned to 5 L/min O2. 12/1: Increased O2 requirement, up to 9 L overnight, 6 L this AM.  SVT vs paroxysmal A-fib - HR in the ED after admission was as high as 180s.  No improvement with Valsalva or carotid massage.  Adenosine was considered, but rhythm improved prior to this being pushed.  Later given IV Cardizem push with improvement. 11/30: recurrent SVT this AM --Cardiology consulted, see their recs --Follow up Echo --Started on Eliquis (CHA2DS2-VASc score 2 (age, gender)) --PO Cardizem 30 mg q6h, switch to CD before d/c   COPD exacerbation - POA.  Severe advanced COPD.  Follows with Dr. Raul Del.   Has wheezing and poor air movement, on more O2 than yesterday.  --Start Zithromax for ?bronchitis and anti-inflammatory --Continue Solu-Medrol --Continue Xopenex given tachycardia --Continue Atrovent and  Dulera  --Consult Pulmonology --Chest PT w vest --Pulmonary hygiene  Hypokalemia - replaced 12/1 for K 3.2.  Monitor and replace as needed.  Tobacco abuse - Nicotine patch PRN  Pulmonary nodules - PET scan April consistent with benign etiology.  But recommended following up with CT scans to monitor.  Constipation - bowel regimen ordered.  Enema and/or suppository today.    DVT prophylaxis:  apixaban (ELIQUIS) tablet 5 mg   Diet:  Diet Orders (From admission, onward)     Start     Ordered   11/05/21 2313  Diet regular Room service appropriate? Yes; Fluid consistency: Thin  Diet effective now       Question Answer Comment  Room service appropriate? Yes   Fluid consistency: Thin      11/05/21 2314              Code Status: DNR   Subjective 11/08/21    Pt having nasal congestion this AM.  Reports being very weak, can't squeeze ocean saline bottle.  Son at bedside concerned she may be too weak, will need help at home, he lives in [place].  Pt denies fever/chills, CP.  SOB improved.   Disposition Plan & Communication   Status is: Inpatient  Remains inpatient appropriate because: on IV therapies and still requiring o2 supplementation above baseline.  Family communication - son at bedside on rounds 11/30, 12/1  Consults, Procedures, Significant Events   Consultants:  None  Procedures:  none  Antimicrobials:  Anti-infectives (From admission, onward)    Start  Dose/Rate Route Frequency Ordered Stop   11/07/21 2000  azithromycin (ZITHROMAX) 500 mg in sodium chloride 0.9 % 250 mL IVPB        500 mg 250 mL/hr over 60 Minutes Intravenous Every 24 hours 11/07/21 1926 11/11/21 2359         Micro    Objective   Vitals:   11/08/21 1423 11/08/21 1630 11/08/21 1919 11/08/21 1949  BP:  124/63  (!) 121/55  Pulse:  87  87  Resp:  16  20  Temp:  98.3 F (36.8 C)  97.9 F (36.6 C)  TempSrc:      SpO2: 94% 95% 92% 97%    Intake/Output Summary (Last 24  hours) at 11/08/2021 2007 Last data filed at 11/08/2021 1518 Gross per 24 hour  Intake 1013 ml  Output --  Net 1013 ml   There were no vitals filed for this visit.  Physical Exam:  General exam: awake, alert, no acute distress, frail, chronically ill-appearing Respiratory system: continues to have poor aeration, faint high-pitched expiratory wheezes, normal respiratory effort at rest on 6 L/min O2 [place]. Cardiovascular system: normal S1/S2, RRR, no pedal edema.   Central nervous system: A&O x3. no gross focal neurologic deficits, normal speech Psychiatry: normal mood, congruent affect, judgement and insight appear normal  Labs   Data Reviewed: I have personally reviewed following labs and imaging studies  CBC: Recent Labs  Lab 11/03/21 0251 11/05/21 2057 11/08/21 0441  WBC 9.2 10.5 11.4*  HGB 15.0 15.2* 13.7  HCT 47.0* 50.6* 44.7  MCV 100.6* 106.3* 101.8*  PLT 205 205 099   Basic Metabolic Panel: Recent Labs  Lab 11/03/21 0251 11/05/21 2057 11/08/21 0441  NA 133* 138 139  K 3.9 3.6 3.2*  CL 92* 90* 91*  CO2 34* 44* 42*  GLUCOSE 140* 191* 143*  BUN 9 15 17   CREATININE 0.54 0.43* 0.41*  CALCIUM 8.5* 8.6* 8.6*  MG  --   --  2.3   GFR: Estimated Creatinine Clearance: 57.5 mL/min (A) (by C-G formula based on SCr of 0.41 mg/dL (L)). Liver Function Tests: No results for input(s): AST, ALT, ALKPHOS, BILITOT, PROT, ALBUMIN in the last 168 hours. No results for input(s): LIPASE, AMYLASE in the last 168 hours. No results for input(s): AMMONIA in the last 168 hours. Coagulation Profile: No results for input(s): INR, PROTIME in the last 168 hours. Cardiac Enzymes: No results for input(s): CKTOTAL, CKMB, CKMBINDEX, TROPONINI in the last 168 hours. BNP (last 3 results) No results for input(s): PROBNP in the last 8760 hours. HbA1C: No results for input(s): HGBA1C in the last 72 hours. CBG: No results for input(s): GLUCAP in the last 168 hours. Lipid Profile: No results for  input(s): CHOL, HDL, LDLCALC, TRIG, CHOLHDL, LDLDIRECT in the last 72 hours. Thyroid Function Tests: No results for input(s): TSH, T4TOTAL, FREET4, T3FREE, THYROIDAB in the last 72 hours. Anemia Panel: No results for input(s): VITAMINB12, FOLATE, FERRITIN, TIBC, IRON, RETICCTPCT in the last 72 hours. Sepsis Labs: No results for input(s): PROCALCITON, LATICACIDVEN in the last 168 hours.  Recent Results (from the past 240 hour(s))  Resp Panel by RT-PCR (Flu A&B, Covid) Nasopharyngeal Swab     Status: None   Collection Time: 11/03/21  5:36 AM   Specimen: Nasopharyngeal Swab; Nasopharyngeal(NP) swabs in vial transport medium  Result Value Ref Range Status   SARS Coronavirus 2 by RT PCR NEGATIVE NEGATIVE Final    Comment: (NOTE) SARS-CoV-2 target nucleic acids are NOT DETECTED.  The  SARS-CoV-2 RNA is generally detectable in upper respiratory specimens during the acute phase of infection. The lowest concentration of SARS-CoV-2 viral copies this assay can detect is 138 copies/mL. A negative result does not preclude SARS-Cov-2 infection and should not be used as the sole basis for treatment or other patient management decisions. A negative result may occur with  improper specimen collection/handling, submission of specimen other than nasopharyngeal swab, presence of viral mutation(s) within the areas targeted by this assay, and inadequate number of viral copies(<138 copies/mL). A negative result must be combined with clinical observations, patient history, and epidemiological information. The expected result is Negative.  Fact Sheet for Patients:  EntrepreneurPulse.com.au  Fact Sheet for Healthcare Providers:  IncredibleEmployment.be  This test is no t yet approved or cleared by the [place] FDA and  has been authorized for detection and/or diagnosis of SARS-CoV-2 by FDA under an Emergency Use Authorization (EUA). This EUA will remain  in effect  (meaning this test can be used) for the duration of the COVID-19 declaration under Section 564(b)(1) of the Act, 21 U.S.C.section 360bbb-3(b)(1), unless the authorization is terminated  or revoked sooner.       Influenza A by PCR NEGATIVE NEGATIVE Final   Influenza B by PCR NEGATIVE NEGATIVE Final    Comment: (NOTE) The Xpert Xpress SARS-CoV-2/FLU/RSV plus assay is intended as an aid in the diagnosis of influenza from Nasopharyngeal swab specimens and should not be used as a sole basis for treatment. Nasal washings and aspirates are unacceptable for Xpert Xpress SARS-CoV-2/FLU/RSV testing.  Fact Sheet for Patients: EntrepreneurPulse.com.au  Fact Sheet for Healthcare Providers: IncredibleEmployment.be  This test is not yet approved or cleared by the [place] FDA and has been authorized for detection and/or diagnosis of SARS-CoV-2 by FDA under an Emergency Use Authorization (EUA). This EUA will remain in effect (meaning this test can be used) for the duration of the COVID-19 declaration under Section 564(b)(1) of the Act, 21 U.S.C. section 360bbb-3(b)(1), unless the authorization is terminated or revoked.  Performed at [facility], [address] [place], [place] [address]   Resp Panel by RT-PCR (Flu A&B, Covid) Nasopharyngeal Swab     Status: None   Collection Time: 11/05/21 10:02 PM   Specimen: Nasopharyngeal Swab; Nasopharyngeal(NP) swabs in vial transport medium  Result Value Ref Range Status   SARS Coronavirus 2 by RT PCR NEGATIVE NEGATIVE Final    Comment: (NOTE) SARS-CoV-2 target nucleic acids are NOT DETECTED.  The SARS-CoV-2 RNA is generally detectable in upper respiratory specimens during the acute phase of infection. The lowest concentration of SARS-CoV-2 viral copies this assay can detect is 138 copies/mL. A negative result does not preclude SARS-Cov-2 infection and should not be used as the sole basis for  treatment or other patient management decisions. A negative result may occur with  improper specimen collection/handling, submission of specimen other than nasopharyngeal swab, presence of viral mutation(s) within the areas targeted by this assay, and inadequate number of viral copies(<138 copies/mL). A negative result must be combined with clinical observations, patient history, and epidemiological information. The expected result is Negative.  Fact Sheet for Patients:  EntrepreneurPulse.com.au  Fact Sheet for Healthcare Providers:  IncredibleEmployment.be  This test is no t yet approved or cleared by the [place] FDA and  has been authorized for detection and/or diagnosis of SARS-CoV-2 by FDA under an Emergency Use Authorization (EUA). This EUA will remain  in effect (meaning this test can be used) for the duration of the COVID-19  declaration under Section 564(b)(1) of the Act, 21 U.S.C.section 360bbb-3(b)(1), unless the authorization is terminated  or revoked sooner.       Influenza A by PCR NEGATIVE NEGATIVE Final   Influenza B by PCR NEGATIVE NEGATIVE Final    Comment: (NOTE) The Xpert Xpress SARS-CoV-2/FLU/RSV plus assay is intended as an aid in the diagnosis of influenza from Nasopharyngeal swab specimens and should not be used as a sole basis for treatment. Nasal washings and aspirates are unacceptable for Xpert Xpress SARS-CoV-2/FLU/RSV testing.  Fact Sheet for Patients: EntrepreneurPulse.com.au  Fact Sheet for Healthcare Providers: IncredibleEmployment.be  This test is not yet approved or cleared by the [place] FDA and has been authorized for detection and/or diagnosis of SARS-CoV-2 by FDA under an Emergency Use Authorization (EUA). This EUA will remain in effect (meaning this test can be used) for the duration of the COVID-19 declaration under Section 564(b)(1) of the Act, 21  U.S.C. section 360bbb-3(b)(1), unless the authorization is terminated or revoked.  Performed at [facility], [address] [place], [place] [address]       Imaging Studies   ECHOCARDIOGRAM COMPLETE  Result Date: 11/07/2021    ECHOCARDIOGRAM REPORT   Patient Name:   Margaret Stevenson [facility] Date of Exam: 11/07/2021 Medical Rec #:  [id number]         Height:       66.5 in Accession #:    [id number]        Weight:       130.0 lb Date of Birth:  Sep 25, 1946          BSA:          1.675 m Patient Age:    54 years          BP:           141/85 mmHg Patient Gender: F                 HR:           91 bpm. Exam Location:  [facility] Procedure: 2D Echo, Color Doppler and Cardiac Doppler Indications:    R94.31 Abnormal ECG  History:        Patient has no prior history of Echocardiogram examinations.                 COPD.  Sonographer:    Charmayne Sheer Referring Phys: [id number] Loletha Grayer Diagnosing      Kate Sable MD Phys:  Sonographer Comments: Technically challenging study due to limited acoustic windows and no apical window. Image acquisition challenging due to uncooperative patient and Image acquisition challenging due to COPD. IMPRESSIONS  1. Left ventricular ejection fraction, by estimation, is 55 to 60%. Left ventricular ejection fraction by PLAX is 56 %. The left ventricle has normal function. The left ventricle has no regional wall motion abnormalities. Left ventricular diastolic parameters are indeterminate.  2. Right ventricular systolic function is normal. The right ventricular size is normal.  3. The mitral valve is normal in structure. No evidence of mitral valve regurgitation.  4. The aortic valve was not well visualized. Aortic valve regurgitation is not visualized.  5. The inferior vena cava is normal in size with greater than 50% respiratory variability, suggesting right atrial pressure of 3 mmHg. FINDINGS  Left Ventricle: Left ventricular ejection fraction, by estimation, is 55 to 60%. Left  ventricular ejection fraction by PLAX is 56 %. The left ventricle has normal function. The left ventricle has no regional wall motion  abnormalities. The left ventricular internal cavity size was normal in size. There is no left ventricular hypertrophy. Left ventricular diastolic parameters are indeterminate. Right Ventricle: The right ventricular size is normal. No increase in right ventricular wall thickness. Right ventricular systolic function is normal. Left Atrium: Left atrial size was normal in size. Right Atrium: Right atrial size was normal in size. Pericardium: There is no evidence of pericardial effusion. Mitral Valve: The mitral valve is normal in structure. No evidence of mitral valve regurgitation. MV peak gradient, 2.5 mmHg. The mean mitral valve gradient is 1.0 mmHg. Tricuspid Valve: The tricuspid valve is grossly normal. Tricuspid valve regurgitation is not demonstrated. Aortic Valve: The aortic valve was not well visualized. Aortic valve regurgitation is not visualized. Aortic valve mean gradient measures 3.0 mmHg. Aortic valve peak gradient measures 5.4 mmHg. Aortic valve area, by VTI measures 3.88 cm. Pulmonic Valve: The pulmonic valve was not well visualized. Pulmonic valve regurgitation is not visualized. Aorta: The aortic root is normal in size and structure. Venous: The inferior vena cava is normal in size with greater than 50% respiratory variability, suggesting right atrial pressure of 3 mmHg. IAS/Shunts: No atrial level shunt detected by color flow Doppler.  LEFT VENTRICLE PLAX 2D LV EF:         Left            Diastology                ventricular     LV e' medial:   6.64 cm/s                ejection        LV E/e' medial: 12.1                fraction by                PLAX is 56                %. LVIDd:         3.96 cm LVIDs:         2.82 cm LV PW:         0.92 cm LV IVS:        0.57 cm LVOT diam:     2.10 cm LV SV:         74 LV SV Index:   44 LVOT Area:     3.46 cm  LEFT ATRIUM          Index LA diam:    3.20 cm 1.91 cm/m  AORTIC VALVE                    PULMONIC VALVE AV Area (Vmax):    3.28 cm     PV Vmax:       0.90 m/s AV Area (Vmean):   3.22 cm     PV Vmean:      58.500 cm/s AV Area (VTI):     3.88 cm     PV VTI:        0.145 m AV Vmax:           116.00 cm/s  PV Peak grad:  3.2 mmHg AV Vmean:          79.000 cm/s  PV Mean grad:  2.0 mmHg AV VTI:            0.190 m AV Peak Grad:      5.4 mmHg AV Mean Grad:  3.0 mmHg LVOT Vmax:         110.00 cm/s LVOT Vmean:        73.500 cm/s LVOT VTI:          0.213 m LVOT/AV VTI ratio: 1.12  AORTA Ao Root diam: 2.60 cm MITRAL VALVE MV Area (PHT): 2.39 cm    SHUNTS MV Area VTI:   2.97 cm    Systemic VTI:  0.21 m MV Peak grad:  2.5 mmHg    Systemic Diam: 2.10 cm MV Mean grad:  1.0 mmHg MV Vmax:       0.79 m/s MV Vmean:      45.0 cm/s MV Decel Time: 318 msec MV E velocity: 80.60 cm/s MV A velocity: 73.70 cm/s MV E/A ratio:  1.09 Kate Sable MD Electronically signed by Kate Sable MD Signature Date/Time: 11/07/2021/10:04:16 AM    Final      Medications   Scheduled Meds:  apixaban  5 mg Oral BID   diltiazem  60 mg Oral Q6H   fluticasone  2 spray Each Nare Daily   ipratropium  0.5 mg Nebulization Q6H   levalbuterol  1.25 mg Nebulization Q6H   mometasone-formoterol  2 puff Inhalation BID   multivitamin with minerals  1 tablet Oral Daily   polyethylene glycol  17 g Oral Daily   pravastatin  40 mg Oral q1800   predniSONE  40 mg Oral Q breakfast   senna-docusate  1 tablet Oral BID   Continuous Infusions:  azithromycin Stopped (11/07/21 2216)       LOS: 3 days    Time spent: 30 minutes with > 50% spent at bedside and in coordination of care     Ezekiel Slocumb, DO [facility]  11/08/2021, 8:07 PM      If 7PM-7AM, please contact night-coverage. How to contact the [facility] Attending or Consulting provider [place] or covering provider during after hours [place], for this patient?    Check the care team in  [place] and look for a) attending/consulting [facility] provider listed and b) the [facility] team listed Log into [email or web address] and use [place]'s universal password to access. If you do not have the password, please contact the hospital operator. Locate the [facility] provider you are looking for under [facility] and page to a number that you can be directly reached. If you still have difficulty reaching the provider, please page the [facility] (Director on Call) for the Hospitalists listed on amion for assistance.

## 2021-11-08 NOTE — TOC Initial Note (Signed)
Transition of Care [facility]) - Initial/Assessment Note    Patient Details  Name: Margaret Stevenson MRN: [id number] Date of Birth: 1946-09-09  [facility] CM/SW Contact:    Alberteen Sam, LCSW Phone Number: 11/08/2021, 1:19 PM  Clinical Narrative:                  CSW spoke with patient's son and patient to inform of SNF recommendation by PT.   They are in agreement with preference for [facility]. Would also be agreeable for [facility], [facility], or last option [facility]. Reports she would not like to go ot [facility].   CSW has sent referrals pending bed offer at this time.      Expected Discharge Plan: Skilled Nursing Facility Barriers to Discharge: Continued Medical Work up   Patient Goals and CMS Choice Patient states their goals for this hospitalization and ongoing recovery are:: to go home CMS Medicare.gov Compare Post Acute Care list provided to:: Patient Choice offered to / list presented to : Patient  Expected Discharge Plan and Services Expected Discharge Plan: [place]       Living arrangements for the past 2 months: Single Family Home                                      Prior Living Arrangements/Services Living arrangements for the past 2 months: Single Family Home Lives with:: Self Patient language and need for interpreter reviewed:: Yes        Need for Family Participation in Patient Care: Yes (Comment) Care giver support system in place?: Yes (comment)   Criminal Activity/Legal Involvement Pertinent to Current Situation/Hospitalization: No - Comment as needed  Activities of Daily Living Home Assistive Devices/Equipment: Oxygen, Walker (specify type) ADL Screening (condition at time of admission) Patient's cognitive ability adequate to safely complete daily activities?: Yes Is the patient deaf or have difficulty hearing?: No Does the patient have difficulty seeing, even when wearing glasses/contacts?: No Does the  patient have difficulty concentrating, remembering, or making decisions?: No Patient able to express need for assistance with ADLs?: Yes Does the patient have difficulty dressing or bathing?: Yes Independently performs ADLs?: No Does the patient have difficulty walking or climbing stairs?: Yes Weakness of Legs: Both Weakness of Arms/Hands: Both  Permission Sought/Granted                  Emotional Assessment   Attitude/Demeanor/Rapport: Gracious Affect (typically observed): Calm Orientation: : Oriented to Self, Oriented to Place, Oriented to  Time, Oriented to Situation Alcohol / Substance Use: Not Applicable Psych Involvement: No (comment)  Admission diagnosis:  SOB (shortness of breath) [R06.02] COPD exacerbation (HCC) [J44.1] Acute respiratory failure with hypoxia ([place]) [J96.01] COPD with acute exacerbation ([place]) [J44.1] Patient Active Problem List   Diagnosis Date Noted   Malnutrition of moderate degree 11/07/2021   Paroxysmal atrial fibrillation (HCC)    SVT (supraventricular tachycardia) (HCC)    Tobacco abuse    COPD with acute exacerbation ([place]) 11/05/2021   Acute on chronic respiratory failure with hypoxia ([place]) 11/05/2021   Influenza A 01/24/2018   Fitting and adjustment of gastrointestinal appliance and device    Hx of colonic polyps    Benign neoplasm of cecum    Benign neoplasm of ascending colon    Benign neoplasm of transverse colon    Cancer ([place]) 05/08/2017   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  Elevated CA 19-9 level 05/08/2017   Arthritis 05/07/2017   Asthma without status asthmaticus 05/07/2017   Borderline diabetes 05/07/2017   Hx of adenomatous colonic polyps 05/07/2017   Tobacco dependence 05/07/2017   Sepsis ([place]) 04/27/2017   Disease of biliary tract    Obstruction of bile duct    Jaundice    Hypoxia 10/11/2016   Anxiety 10/11/2016   COPD (chronic obstructive pulmonary disease) ([place]) 10/06/2016   CAP (community acquired pneumonia)  10/06/2016   Hyperglycemia 10/06/2016   Vaccine counseling 06/21/2016   History of malignant melanoma of skin 08/03/2013   PCP:  Dion Body, MD Pharmacy:   [id number] [address], [address] [place] [address] Phone: [phone] Fax: [phone]  [facility] [place], [place] [address] [place] [address] Phone: [phone] Fax: [phone]     Social Determinants of Health ([place]) Interventions    Readmission Risk Interventions No flowsheet data found.

## 2021-11-08 NOTE — Evaluation (Signed)
Occupational Therapy Evaluation Patient Details Name: Margaret Stevenson MRN: [id number] DOB: Aug 16, 1946 Today's Date: 11/08/2021   History of Present Illness Pt is a 75 y.o. female with medical history significant for Nicotine dependence and COPD on home oxygen at 3 L who presents to the ED for the second time in 2 days with COPD exacerbation unrelieved with home bronchodilators.  EMS recorded O2 sats in the 80s on her home flow rate and she was placed on CPAP. PMH of COPD, previous melanoma and pulmonary nodules.   Clinical Impression   Pt in bed upon OT arrival, son present for duration of session.  Pt agreeable to complete OT eval after resting from earlier PT eval.  Pt presents with decreased activity tolerance and SOB, limiting ADLs and functional mobility.  Pt adamantly declined any transfers out of bed, declined sit to stand attempts despite max encouragement from OT.  Pt resting at 92% with 5L 02, boosted to 7L for EOB sitting with 02 sats remaining at 92% and PLB.  Pt sat EOB on 1 attempt for 1 min and min guard, then pt initiated return to supine without physical assist when OT encouraged sit to stand attempt.  Pt ultimately sat up again with min A for transition supine to sit, and sat EOB for another 3 min with close supv, sats remained as above.  Pt returned herself to side lying stating she could not breathe, though 02 sats remained 92-93%.  Pt adamantly refused any other activity.  OT assisted pt toward [facility], pt contributing to sheet adjustment beneath her with slight bridging.  OT assisted to elevate HOB and left pt with all needs met and within reach and son present in room.  Pt will benefit from skilled OT in acute setting to work towards return to PLOF with ADLs and functional mobility.  At baseline, pt lived alone with intermittent caregiver support and PCA assist, but has had functional decline over the last 6 months, limiting mobility bathroom<>couch<>bedroom with walker.  Will benefit  from SNF at d/c to work towards eventual return home.      Recommendations for follow up therapy are one component of a multi-disciplinary discharge planning process, led by the attending physician.  Recommendations may be updated based on patient status, additional functional criteria and insurance authorization.   Follow Up Recommendations  Skilled nursing-short term rehab (<3 hours/day)    Assistance Recommended at Discharge Frequent or constant Supervision/Assistance  Functional Status Assessment  Patient has had a recent decline in their functional status and demonstrates the ability to make significant improvements in function in a reasonable and predictable amount of time.  Equipment Recommendations  Other (comment) (defer to next venue of care)           Precautions / Restrictions Precautions Precautions: Fall Restrictions Weight Bearing Restrictions: No      Mobility Bed Mobility Overal bed mobility: Needs Assistance Bed Mobility: Supine to Sit;Sit to Supine;Rolling Rolling: Min assist   Supine to sit: Min assist Sit to supine: Supervision;HOB elevated   General bed mobility comments: min A to transition trunk supine to sit with pt holding bed rail and given cues to push with other hand on bed Patient Response:  (agitated, poor response to encouragement for activity)                     General transfer comment: pt adamantly declined all transfers.      Balance Overall balance assessment: Needs assistance Sitting-balance support: Feet  supported;Bilateral upper extremity supported Sitting balance-Leahy Scale: Fair Sitting balance - Comments: CGA for 1 min, then pt initiated return to supine when OT encouraged standing attempt; returned to sitting x3 min with close supv and BUE on bed, feet supported.       Standing balance comment: Pt declined to attempt                           ADL either performed or assessed with clinical judgement    ADL Overall ADL's : Needs assistance/impaired                             Toileting- Clothing Manipulation and Hygiene: Bed level;Maximal assistance Toileting - Clothing Manipulation Details (indicate cue type and reason): OT max assist to lower panties for unhooking/hooking purewick tube for therapy.       General ADL Comments: Pt adamantly declined any transfer out of bed, agreeable to EOB sitting only     Vision Patient Visual Report: No change from baseline                  Pertinent Vitals/Pain Pain Assessment: No/denies pain     Hand Dominance     Extremity/Trunk Assessment Upper Extremity Assessment Upper Extremity Assessment: Generalized weakness   Lower Extremity Assessment Lower Extremity Assessment: Defer to PT evaluation   Cervical / Trunk Assessment Cervical / Trunk Assessment: Kyphotic   Communication Communication Communication: No difficulties   Cognition Arousal/Alertness: Awake/alert Behavior During Therapy: Agitated (self limiting behavior) Overall Cognitive Status: Within Functional Limits for tasks assessed                                 General Comments: Pt did not respond well to encouragement           Exercises General Exercises - Lower Extremity Ankle Circles/Pumps: AROM;Strengthening;Both;10 reps Quad Sets: AROM;Strengthening;Both;10 reps Short Arc Quad: AROM;Strengthening;Both;10 reps Heel Slides: AAROM;Both;10 reps;Strengthening Hip ABduction/ADduction: AAROM;Strengthening;Both;10 reps   Shoulder Instructions      Home Living Family/patient expects to be discharged to:: Private residence Living Arrangements: Alone Available Help at Discharge: Family;Friend(s);Available PRN/intermittently;Personal care attendant Type of Home: House Home Access: Stairs to enter [employer] of Steps: 4 Entrance Stairs-Rails: Right;Left;Can reach both Home Layout: One level     Bathroom Shower/Tub:  Teacher, early years/pre: Standard     Home Equipment: Rollator (4 wheels);Rolling Walker (2 wheels);Shower seat - built in   Additional Comments: 1 fall      Prior Functioning/Environment Prior Level of Function : Independent/Modified Independent             Mobility Comments: Per PT eval, pt has had worsening mobility for the last 6 months, stated at home she is walking minimally, to couch and bed and back. ADLs Comments: used RW at home for mobility; ADLs performed in sitting, intermittently with caregiver assist; caregivers managed IADLs.        OT Problem List: Decreased strength;Decreased activity tolerance;Cardiopulmonary status limiting activity;Impaired balance (sitting and/or standing);Decreased safety awareness      OT Treatment/Interventions: Self-care/ADL training;Energy conservation;Therapeutic exercise;Therapeutic activities;Patient/family education    OT Goals(Current goals can be found in the care plan section) Acute Rehab OT Goals Patient Stated Goal: improve breathing; pt reports feeling like there is concrete on her chest OT Goal Formulation: With patient Time For Goal  Achievement: 11/21/21 Potential to Achieve Goals: Fair (pt with poor therapy participation and does not respond well to encouragement)  OT Frequency: Min 2X/week   Barriers to D/C:    lives alone with intermittent support; SNF recommended d/t functional decline reported by pt over the last 6 mo                     AM-PAC OT "6 Clicks" Daily Activity     Outcome Measure Help from another person eating meals?: None Help from another person taking care of personal grooming?: A Little Help from another person toileting, which includes using toliet, bedpan, or urinal?: A Lot Help from another person bathing (including washing, rinsing, drying)?: A Lot Help from another person to put on and taking off regular upper body clothing?: A Little Help from another person to put on  and taking off regular lower body clothing?: A Lot 6 Click Score: 16   End of Session    Activity Tolerance: Treatment limited secondary to agitation;Other (comment) (pt with limited cooperation and self limiting behavior) Patient left: in bed;with call bell/phone within reach;with family/visitor present  OT Visit Diagnosis: Muscle weakness (generalized) (M62.81);Other abnormalities of gait and mobility (R26.89)                Time: 1130-1150 OT Time Calculation (min): 20 min Charges:  OT General Charges $OT Visit: 1 Visit OT Evaluation $OT Eval Moderate Complexity: 1 Mod  Leta Speller, MS, OTR/L   Darleene Cleaver 11/08/2021, 12:44 PM

## 2021-11-08 NOTE — Progress Notes (Signed)
Progress Note  Patient Name: Margaret Stevenson Date of Encounter: 11/08/2021  Primary Cardiologist: Kate Sable, MD  Subjective   Denies chest pain or palpitations.  PAF overnight w/ rates into the 140's but asymptomatic.  Dyspneic this AM - says nose is stuffy. Currently on [place] @ 5lpm w/ humidification added.  Typically uses 2-3lpm @ home.   Inpatient Medications    Scheduled Meds:  apixaban  5 mg Oral BID   diltiazem  30 mg Oral Q6H   fluticasone  2 spray Each Nare Daily   ipratropium  0.5 mg Nebulization Q6H   levalbuterol  1.25 mg Nebulization Q6H   mometasone-formoterol  2 puff Inhalation BID   multivitamin with minerals  1 tablet Oral Daily   potassium chloride  40 mEq Oral Q4H   pravastatin  40 mg Oral q1800   predniSONE  40 mg Oral Q breakfast   Continuous Infusions:  azithromycin 500 mg (11/07/21 2113)   PRN Meds: albuterol, diltiazem, nicotine, sodium chloride, sodium chloride flush   Vital Signs    Vitals:   11/07/21 2000 11/08/21 0104 11/08/21 0432 11/08/21 0724  BP:  135/68 123/62 124/68  Pulse:  79 79 84  Resp:  18 17 20   Temp:  98.2 F (36.8 C) 98.4 F (36.9 C) (!) 97.5 F (36.4 C)  TempSrc:  Oral    SpO2: 93% 98% 96% 99%    Intake/Output Summary (Last 24 hours) at 11/08/2021 1003 Last data filed at 11/07/2021 2200 Gross per 24 hour  Intake 390 ml  Output 200 ml  Net 190 ml    Physical Exam   GEN: Thin, frail, in no acute distress.  HEENT: Grossly normal.  Neck: Supple, no JVD, carotid bruits, or masses. Cardiac: RRR, occasional ectopy, no murmurs, rubs, or gallops. No clubbing, cyanosis, edema.  Radials 2+, DP/PT 2+ and equal bilaterally.  Respiratory:  Respirations regular and unlabored, diminished and coarse breath sounds bilat. GI: Soft, nontender, nondistended, BS + x 4. MS: no deformity or atrophy. Skin: warm and dry, no rash. Neuro:  Strength and sensation are intact. Psych: AAOx3.  Normal affect.  Labs     Chemistry Recent Labs  Lab 11/03/21 0251 11/05/21 2057 11/08/21 0441  NA 133* 138 139  K 3.9 3.6 3.2*  CL 92* 90* 91*  CO2 34* 44* 42*  GLUCOSE 140* 191* 143*  BUN 9 15 17   CREATININE 0.54 0.43* 0.41*  CALCIUM 8.5* 8.6* 8.6*  GFRNONAA >60 >60 >60  ANIONGAP 7 4* 6     Hematology Recent Labs  Lab 11/03/21 0251 11/05/21 2057 11/08/21 0441  WBC 9.2 10.5 11.4*  RBC 4.67 4.76 4.39  HGB 15.0 15.2* 13.7  HCT 47.0* 50.6* 44.7  MCV 100.6* 106.3* 101.8*  MCH 32.1 31.9 31.2  MCHC 31.9 30.0 30.6  RDW 13.0 13.0 12.9  PLT 205 205 191    Cardiac Enzymes  Recent Labs  Lab 11/03/21 0252 11/03/21 0536 11/05/21 2057 11/06/21 0204  TROPONINIHS 5 5 8 6       BNP Recent Labs  Lab 11/03/21 0252 11/05/21 2113  BNP 59.6 139.4*     HbA1c  Lab Results  Component Value Date   HGBA1C 4.5 (L) 04/27/2017    Radiology    DG Chest Port 1 View  Result Date: 11/05/2021 CLINICAL DATA:  Shortness of breath. EXAM: PORTABLE CHEST 1 VIEW COMPARISON:  Chest radiograph dated 11/03/2021. FINDINGS: Emphysema. Bibasilar interstitial prominence, likely atelectasis/scarring. Small left upper lobe calcified granuloma. No focal  consolidation, pleural effusion, or pneumothorax. The cardiac silhouette is within limits. Atherosclerotic calcification of the aorta. No acute osseous pathology. IMPRESSION: 1. No acute cardiopulmonary process. 2. Emphysema. Electronically Signed   By: Anner Crete M.D.   On: 11/05/2021 21:30   Telemetry    RSR w/ freq PACs  PAF w/ rates periodically into 140's overnight - Personally Reviewed  Cardiac Studies   TTE 11/07/2021 1. Left ventricular ejection fraction, by estimation, is 55 to 60%. Left  ventricular ejection fraction by PLAX is 56 %. The left ventricle has  normal function. The left ventricle has no regional wall motion  abnormalities. Left ventricular diastolic  parameters are indeterminate.   2. Right ventricular systolic function is normal. The  right ventricular  size is normal.   3. The mitral valve is normal in structure. No evidence of mitral valve  regurgitation.   4. The aortic valve was not well visualized. Aortic valve regurgitation  is not visualized.   5. The inferior vena cava is normal in size with greater than 50%  respiratory variability, suggesting right atrial pressure of 3 mmHg. _____________   Patient Profile     75 y.o. female w/ a h/o COPD and tob abuse, who was admitted 11/28 w/ resp failure and COPD w/ finding of PAF.  Assessment & Plan    1.  Acute on chronic hypoxic resp failure/AECOPD:  Long smoking hx - admitted 11/28 w/ resp failure - emphysema on cxr.  Usually on 2-3 lpm @ home - cont to require 5lpm here.  Nebs/steroids/abx per IM.  2.  PAF:  Noted since admission in the setting of above.  Pt denies tachypalpitations.  Dilt 30 q6h started yesterday but had runs of PAF overnight w/ rates into the 140's.  BP stable.  Increasing dilt to 60mg  q6h.  Nl LV fxn by echo.  Cont eliquis 5mg  BID  CHA2DS2VASc = 2.  If she cont to have frequent PAF w/ elevated rates despite med rx and after adequate treatment of underlying resp issues, will need to consider AAD.  Amio likely a poor choice 2/2 lung dzs.  Renal fxn is nl and no h/o CHF, thus tikosyn, sotalol, and multaq are viable options.  W/ coronary atherosclerosis noted on CT chest, would likely be best to avoid 1c agent.  3.  Hypokalemia:  Supplementation ordered.  4.  Coronary atherosclerosis:  noted on CTA chest.  No h/o chest pain and nl troponins despite rapid afib @ times.  Nl EF by echo.  Cont statin.  5.  HL:  On lovastatin @ home.  LDL 88 in Sept (care everywhere).  Signed, Murray Hodgkins, NP  11/08/2021, 10:03 AM    For questions or updates, please contact   Please consult [email or web address] for contact info under Cardiology/STEMI.

## 2021-11-08 NOTE — NC FL2 (Signed)
[place] LEVEL OF CARE SCREENING TOOL     IDENTIFICATION  Patient Name: Margaret Stevenson Birthdate: 1946-05-25 Sex: female Admission Date (Current Location): 11/05/2021  [facility] and [place] Number:  Engineering geologist and Address:  [facility], [address] [place], [place] [address]      Provider Number: [id number]  Attending Physician Name and Address:  Ezekiel Slocumb, DO  Relative Name and Phone Number:  son Barnabas Lister [phone]    Current Level of Care: Hospital Recommended Level of Care: [place] Prior Approval Number:    Date Approved/Denied:   PASRR Number: [id number]  Discharge Plan: SNF    Current Diagnoses: Patient Active Problem List   Diagnosis Date Noted   Malnutrition of moderate degree 11/07/2021   Paroxysmal atrial fibrillation (HCC)    SVT (supraventricular tachycardia) (HCC)    Tobacco abuse    COPD with acute exacerbation ([place]) 11/05/2021   Acute on chronic respiratory failure with hypoxia ([place]) 11/05/2021   Influenza A 01/24/2018   Fitting and adjustment of gastrointestinal appliance and device    Hx of colonic polyps    Benign neoplasm of cecum    Benign neoplasm of ascending colon    Benign neoplasm of transverse colon    Cancer ([place]) 05/08/2017   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   Elevated CA 19-9 level 05/08/2017   Arthritis 05/07/2017   Asthma without status asthmaticus 05/07/2017   Borderline diabetes 05/07/2017   Hx of adenomatous colonic polyps 05/07/2017   Tobacco dependence 05/07/2017   Sepsis ([place]) 04/27/2017   Disease of biliary tract    Obstruction of bile duct    Jaundice    Hypoxia 10/11/2016   Anxiety 10/11/2016   COPD (chronic obstructive pulmonary disease) ([place]) 10/06/2016   CAP (community acquired pneumonia) 10/06/2016   Hyperglycemia 10/06/2016   Vaccine counseling 06/21/2016   History of malignant melanoma of skin 08/03/2013    Orientation  RESPIRATION BLADDER Height & Weight     Self, Time, Situation, Place  O2 (5L nasal cannula) External catheter, Incontinent Weight:   Height:     BEHAVIORAL SYMPTOMS/MOOD NEUROLOGICAL BOWEL NUTRITION STATUS      Incontinent Diet (see discharge summary)  AMBULATORY STATUS COMMUNICATION OF NEEDS Skin   Extensive Assist Verbally Normal                       Personal Care Assistance Level of Assistance  Bathing, Feeding, Total care, Dressing Bathing Assistance: Limited assistance Feeding assistance: Limited assistance Dressing Assistance: Maximum assistance Total Care Assistance: Maximum assistance   Functional Limitations Info  Sight, Hearing, Speech Sight Info: Adequate Hearing Info: Adequate Speech Info: Adequate    SPECIAL CARE FACTORS FREQUENCY  PT (By licensed PT), OT (By licensed OT)     PT Frequency: min 3x weekly OT Frequency: min 3x weekly            Contractures Contractures Info: Not present    Additional Factors Info  Code Status, Allergies Code Status Info: DNR Allergies Info: Penicillins           Current Medications (11/08/2021):  This is the current hospital active medication list Current Facility-Administered Medications  Medication Dose Route Frequency Provider Last Rate Last Admin   albuterol (PROVENTIL) (2.5 MG/3ML) 0.083% nebulizer solution 2.5 mg  2.5 mg Nebulization Q2H PRN Athena Masse, MD   2.5 mg at 11/06/21 1858   apixaban (ELIQUIS) tablet 5 mg  5 mg Oral  BID Kate Sable, MD   5 mg at 11/08/21 0950   azithromycin (ZITHROMAX) 500 mg in sodium chloride 0.9 % 250 mL IVPB  500 mg Intravenous Q24H Oswald Hillock, RPH 250 mL/hr at 11/07/21 2113 500 mg at 11/07/21 2113   diltiazem (CARDIZEM) injection 10 mg  10 mg Intravenous Q1H PRN Loletha Grayer, MD   10 mg at 11/08/21 0339   diltiazem (CARDIZEM) tablet 60 mg  60 mg Oral Q6H Theora Gianotti, NP   60 mg at 11/08/21 1315   fluticasone (FLONASE) 50 MCG/ACT nasal spray 2  spray  2 spray Each Nare Daily Nicole Kindred A, DO   2 spray at 11/08/21 0950   ipratropium (ATROVENT) nebulizer solution 0.5 mg  0.5 mg Nebulization Q6H Wieting, Richard, MD   0.5 mg at 11/08/21 0758   levalbuterol (XOPENEX) nebulizer solution 1.25 mg  1.25 mg Nebulization Q6H Loletha Grayer, MD   1.25 mg at 11/08/21 0758   mometasone-formoterol (DULERA) 200-5 MCG/ACT inhaler 2 puff  2 puff Inhalation BID Loletha Grayer, MD   2 puff at 11/08/21 0950   multivitamin with minerals tablet 1 tablet  1 tablet Oral Daily Nicole Kindred A, DO   1 tablet at 11/08/21 0950   nicotine (NICODERM CQ - dosed in mg/24 hours) patch 14 mg  14 mg Transdermal Daily PRN Loletha Grayer, MD       pravastatin (PRAVACHOL) tablet 40 mg  40 mg Oral q1800 Loletha Grayer, MD   40 mg at 11/07/21 1658   predniSONE (DELTASONE) tablet 40 mg  40 mg Oral Q breakfast Judd Gaudier V, MD   40 mg at 11/08/21 0950   sodium chloride (OCEAN) 0.65 % nasal spray 1 spray  1 spray Each Nare PRN Nicole Kindred A, DO   1 spray at 11/07/21 1007   sodium chloride flush (NS) 0.9 % injection 10 mL  10 mL Intravenous PRN Ezekiel Slocumb, DO         Discharge Medications: Please see discharge summary for a list of discharge medications.  Relevant Imaging Results:  Relevant Lab Results:   Additional Information [facility]:[id number]  Alberteen Sam, LCSW

## 2021-11-08 NOTE — Evaluation (Signed)
Physical Therapy Evaluation Patient Details Name: Margaret Stevenson MRN: [id number] DOB: February 20, 1946 Today's Date: 11/08/2021  History of Present Illness  Pt is a 75 y.o. female with medical history significant for Nicotine dependence and COPD on home oxygen at 3 L who presents to the ED for the second time in 2 days with COPD exacerbation unrelieved with home bronchodilators.  EMS recorded O2 sats in the 80s on her home flow rate and she was placed on CPAP. PMH of COPD, previous melanoma and pulmonary nodules.   Clinical Impression  Patient alert, family at bedside. Pt reported a stuffy nose, and that her bottom hurt today depending on her position in bed. Pt reported at baseline for the last 6 months a decline in mobility, mostly been ambulating from bed to couch at home. Has a rotating schedule of assistance for IADLs (food, set up, cleaning, etc) but they do not provide physical assist. Utilizes rollator in home, 1 fall reported in the last 6 months.  The patient was able to participate in supine exercises, varying from [facility]. Pt on 7L for exertional activity to maintain saturation levels 88% or higher. Supine <> sit with max-total A, pt was able to initiate the transfer, and assisted as able, profound weakness noted. Once in sitting able to maintain balance with CGA, but fatigued and requested to lay back down in less than 1 minute. Returned to supine, rolling with minA and boosted up in bed.  Overall the patient demonstrated deficits (see "PT Problem List") that impede the patient's functional abilities, safety, and mobility and would benefit from skilled PT intervention. Recommendation is SNF at this time due to decline in functional mobility and current level of assistance needed.     Recommendations for follow up therapy are one component of a multi-disciplinary discharge planning process, led by the attending physician.  Recommendations may be updated based on patient status, additional  functional criteria and insurance authorization.  Follow Up Recommendations Skilled nursing-short term rehab (<3 hours/day)    Assistance Recommended at Discharge    Functional Status Assessment Patient has had a recent decline in their functional status and demonstrates the ability to make significant improvements in function in a reasonable and predictable amount of time.  Equipment Recommendations  Other (comment) (TBD at next venue of care)    Recommendations for Other Services       Precautions / Restrictions Precautions Precautions: Fall Restrictions Weight Bearing Restrictions: No      Mobility  Bed Mobility Overal bed mobility: Needs Assistance Bed Mobility: Supine to Sit;Sit to Supine;Rolling Rolling: Min assist   Supine to sit: Total assist Sit to supine: Max assist   General bed mobility comments: pt does attempt to initiate movement, ultimately needed max-totalA to complete safely    Transfers                        Ambulation/Gait                  Stairs            Wheelchair Mobility    Modified Rankin (Stroke Patients Only)       Balance Overall balance assessment: Needs assistance Sitting-balance support: Feet supported;Bilateral upper extremity supported Sitting balance-Leahy Scale: Fair Sitting balance - Comments: CGA less than 1 minute in sitting spO2 92% on 7L  Pertinent Vitals/Pain Pain Assessment: No/denies pain    Home Living Family/patient expects to be discharged to:: Private residence Living Arrangements: Alone Available Help at Discharge: Family;Friend(s);Available PRN/intermittently;Personal care attendant Type of Home: House Home Access: Stairs to enter Entrance Stairs-Rails: Right;Left;Can reach both Entrance Stairs-Number of Steps: 4   Home Layout: One level Home Equipment: Rollator (4 wheels);Rolling Walker (2 wheels);Shower seat - built  in Additional Comments: 1 fall    Prior Function               Mobility Comments: pt has had worsening mobility for the last 6 months, stated at home she is walking minimally, to couch and bed and back       Hand Dominance        Extremity/Trunk Assessment   Upper Extremity Assessment Upper Extremity Assessment: Generalized weakness;Defer to OT evaluation    Lower Extremity Assessment Lower Extremity Assessment: Generalized weakness    Cervical / Trunk Assessment Cervical / Trunk Assessment: Kyphotic  Communication      Cognition Arousal/Alertness: Awake/alert Behavior During Therapy: WFL for tasks assessed/performed Overall Cognitive Status: Within Functional Limits for tasks assessed                                          General Comments      Exercises General Exercises - Lower Extremity Ankle Circles/Pumps: AROM;Strengthening;Both;10 reps Quad Sets: AROM;Strengthening;Both;10 reps Short Arc Quad: AROM;Strengthening;Both;10 reps Heel Slides: AAROM;Both;10 reps;Strengthening Hip ABduction/ADduction: AAROM;Strengthening;Both;10 reps   Assessment/Plan    PT Assessment Patient needs continued PT services  PT Problem List Decreased strength;Decreased mobility;Decreased range of motion;Decreased activity tolerance;Decreased balance;Cardiopulmonary status limiting activity       PT Treatment Interventions DME instruction;Therapeutic exercise;Gait training;Balance training;Stair training;Neuromuscular re-education;Functional mobility training;Therapeutic activities;Patient/family education    PT Goals (Current goals can be found in the Care Plan section)  Acute Rehab PT Goals Patient Stated Goal: to get stronger as able, breathe better PT Goal Formulation: With patient Time For Goal Achievement: 11/22/21 Potential to Achieve Goals: Fair    Frequency Min 2X/week   Barriers to discharge Decreased caregiver support;Inaccessible home  environment      Co-evaluation               AM-PAC PT "6 Clicks" Mobility  Outcome Measure Help needed turning from your back to your side while in a flat bed without using bedrails?: A Lot Help needed moving from lying on your back to sitting on the side of a flat bed without using bedrails?: A Lot Help needed moving to and from a bed to a chair (including a wheelchair)?: A Lot Help needed standing up from a chair using your arms (e.g., wheelchair or bedside chair)?: Total Help needed to walk in hospital room?: Total Help needed climbing 3-5 steps with a railing? : Total 6 Click Score: 9    End of Session   Activity Tolerance: Patient limited by fatigue Patient left: in bed;with call bell/phone within reach;with family/visitor present;with nursing/sitter in room Nurse Communication: Mobility status PT Visit Diagnosis: Other abnormalities of gait and mobility (R26.89);Difficulty in walking, not elsewhere classified (R26.2);Muscle weakness (generalized) (M62.81)    Time: 1610-9604 PT Time Calculation (min) (ACUTE ONLY): 34 min   Charges:   PT Evaluation $PT Eval Moderate Complexity: 1 Mod PT Treatments $Therapeutic Exercise: 8-22 mins $Therapeutic Activity: 8-22 mins       Lieutenant Diego PT, DPT  11:19 AM,11/08/21

## 2021-11-09 DIAGNOSIS — J9601 Acute respiratory failure with hypoxia: Secondary | ICD-10-CM | POA: Diagnosis not present

## 2021-11-09 DIAGNOSIS — I480 Paroxysmal atrial fibrillation: Secondary | ICD-10-CM | POA: Diagnosis not present

## 2021-11-09 DIAGNOSIS — R0602 Shortness of breath: Secondary | ICD-10-CM

## 2021-11-09 DIAGNOSIS — J441 Chronic obstructive pulmonary disease with (acute) exacerbation: Secondary | ICD-10-CM | POA: Diagnosis not present

## 2021-11-09 DIAGNOSIS — E440 Moderate protein-calorie malnutrition: Secondary | ICD-10-CM | POA: Diagnosis not present

## 2021-11-09 LAB — MAGNESIUM: Magnesium: 2.3 mg/dL (ref 1.7–2.4)

## 2021-11-09 LAB — CBC
HCT: 47.3 % — ABNORMAL HIGH (ref 36.0–46.0)
Hemoglobin: 14.2 g/dL (ref 12.0–15.0)
MCH: 30.9 pg (ref 26.0–34.0)
MCHC: 30 g/dL (ref 30.0–36.0)
MCV: 103.1 fL — ABNORMAL HIGH (ref 80.0–100.0)
Platelets: 194 10*3/uL (ref 150–400)
RBC: 4.59 MIL/uL (ref 3.87–5.11)
RDW: 13.1 % (ref 11.5–15.5)
WBC: 10.2 10*3/uL (ref 4.0–10.5)
nRBC: 0 % (ref 0.0–0.2)

## 2021-11-09 LAB — BASIC METABOLIC PANEL
Anion gap: 3 — ABNORMAL LOW (ref 5–15)
BUN: 12 mg/dL (ref 8–23)
CO2: 43 mmol/L — ABNORMAL HIGH (ref 22–32)
Calcium: 8.9 mg/dL (ref 8.9–10.3)
Chloride: 93 mmol/L — ABNORMAL LOW (ref 98–111)
Creatinine, Ser: 0.3 mg/dL — ABNORMAL LOW (ref 0.44–1.00)
GFR, Estimated: >60 mL/min (ref >60)
Glucose, Bld: 103 mg/dL — ABNORMAL HIGH (ref 70–99)
Potassium: 4.7 mmol/L (ref 3.5–5.1)
Sodium: 139 mmol/L (ref 135–145)

## 2021-11-09 MED ORDER — METHYLPREDNISOLONE SODIUM SUCC 125 MG IJ SOLR
60.0000 mg | Freq: Two times a day (BID) | INTRAMUSCULAR | Status: DC
  Administered 2021-11-09 – 2021-11-12 (×6): 60 mg via INTRAVENOUS
  Filled 2021-11-09 (×6): qty 2

## 2021-11-09 MED ORDER — DILTIAZEM HCL ER COATED BEADS 120 MG PO CP24
240.0000 mg | ORAL_CAPSULE | Freq: Every evening | ORAL | Status: DC
  Administered 2021-11-09 – 2021-11-11 (×3): 240 mg via ORAL
  Filled 2021-11-09 (×3): qty 2

## 2021-11-09 NOTE — Progress Notes (Signed)
PROGRESS NOTE    Margaret Stevenson   VHQ:[id number]  DOB: [birth year]  PCP: Dion Body, MD    DOA: 11/05/2021 LOS: 4    Brief Narrative / Hospital Course to Date:   75 year old female with past medical history of COPD, previous melanoma and pulmonary nodules who was admitted on 11/05/2021 with difficulty breathing.  CT scan of the chest on 11/03/2021 after an ER visit that was negative for pulmonary embolism and showed stable pulmonary nodules.   Admitted with COPD exacerbation with acute hypoxic respiratory failure initially requiring BiPAP.  Started on steroids.  Patient also developed episodes of SVT.  Cardiology consulted.  Assessment & Plan   Principal Problem:   COPD with acute exacerbation ([name]) Active Problems:   Tobacco dependence   Acute on chronic respiratory failure with hypoxia (HCC)   Paroxysmal atrial fibrillation (HCC)   Malnutrition of moderate degree   Acute on chronic hypoxic respiratory failure - POA with shortness of breath, EMS recorded pulse ox in the 80s on her home oxygen.  Required BiPAP on arrival.   CTA chest 11/26 negative for PE. 11/30: Weaned to 5 L/min O2. 12/1-2: Increased oxygen requirement 8 to 9 L/min HFNC O2.  Attempted to wean to 6 L but desatted to upper 80s at rest.  Paroxysmal A-fib - HR in the ED after admission was as high as 180s.  Initially thought to be SVT, no improvement with Valsalva or carotid massage.  Adenosine was considered, but rhythm improved prior to this being pushed.   Later given IV Cardizem push with improvement. Echo on 11/07/2021: EF 55 to 60%, indeterminate diastolic parameters, no WMA's --Cardiology consulted, see their recs --Started on Eliquis (CHA2DS2-VASc score 2 (age, gender)) --Rate controlled on PO Cardizem 60 mg q6h --Changed to Cardizem CD 240 mg p.o. daily --Optimize respiratory status   COPD exacerbation - POA.  Severe advanced COPD.  Follows with Dr. Raul Del.   Has wheezing and poor air  movement, on more O2 than yesterday.  --Continue Zithromax for ?bronchitis and anti-inflammatory --Solu-Medrol 60 mg IV BID --Continue Xopenex given tachycardia --Continue Atrovent and Dulera  --Consult Pulmonology, Dr. Raul Del --Chest PT w vest --Pulmonary hygiene  Hypokalemia - replaced 12/1 for K 3.2.  Monitor and replace as needed.  Tobacco abuse - Nicotine patch PRN  Pulmonary nodules - PET scan April consistent with benign etiology.  But recommended following up with CT scans to monitor.  Constipation - bowel regimen ordered, including enema and/or suppository if needed.  Had BM overnight 12/1-2.    DVT prophylaxis:  apixaban (ELIQUIS) tablet 5 mg   Diet:  Diet Orders (From admission, onward)     Start     Ordered   11/05/21 2313  Diet regular Room service appropriate? Yes; Fluid consistency: Thin  Diet effective now       Question Answer Comment  Room service appropriate? Yes   Fluid consistency: Thin      11/05/21 2314              Code Status: DNR   Subjective 11/09/21    Pt seen with son at bedside this morning.  She reported the chest PT vest seem to help her this morning.  With that and flutter valve she is been able to clear some phlegm but still very congested in the chest and having difficulty producing sputum.  Did finally have a BM overnight after aggressive bowel regimen yesterday.  Still feels incredibly weak.  No fevers chills.  No  other acute complaints at this time.   Disposition Plan & Communication   Status is: Inpatient  Remains inpatient appropriate because: on IV therapies and still requiring o2 supplementation above baseline.  Family communication - son at bedside on rounds 11/30, 12/1, 12/2  Consults, Procedures, Significant Events   Consultants:  None  Procedures:  none  Antimicrobials:  Anti-infectives (From admission, onward)    Start     Dose/Rate Route Frequency Ordered Stop   11/07/21 2000  azithromycin (ZITHROMAX) 500  mg in sodium chloride 0.9 % 250 mL IVPB        500 mg 250 mL/hr over 60 Minutes Intravenous Every 24 hours 11/07/21 1926 11/11/21 2359         Micro    Objective   Vitals:   11/09/21 0006 11/09/21 0755 11/09/21 1100 11/09/21 1150  BP: (!) 120/55 130/65  (!) 142/64  Pulse: 71 90  98  Resp: 18 17  20   Temp: (!) 97.5 F (36.4 C) 98 F (36.7 C)  97.9 F (36.6 C)  TempSrc:      SpO2: 99% 96% (!) 89% 94%    Intake/Output Summary (Last 24 hours) at 11/09/2021 1628 Last data filed at 11/09/2021 1551 Gross per 24 hour  Intake 990 ml  Output 300 ml  Net 690 ml   There were no vitals filed for this visit.  Physical Exam:  General exam: awake, alert, no acute distress, frail, underweight, chronically ill-appearing Respiratory system: Aeration improved today, currently without 8 expiratory wheezes, normal respiratory effort at rest on 6 L/min O2 HFNC. Cardiovascular system: Regular rate and rhythm, no peripheral edema.   Central nervous system: A&O x3.  Normal speech.  Grossly nonfocal exam Psychiatry: normal mood but withdrawn, congruent affect, judgement and insight appear normal  Labs   Data Reviewed: I have personally reviewed following labs and imaging studies  CBC: Recent Labs  Lab 11/03/21 0251 11/05/21 2057 11/08/21 0441 11/09/21 0540  WBC 9.2 10.5 11.4* 10.2  HGB 15.0 15.2* 13.7 14.2  HCT 47.0* 50.6* 44.7 47.3*  MCV 100.6* 106.3* 101.8* 103.1*  PLT 205 205 191 858   Basic Metabolic Panel: Recent Labs  Lab 11/03/21 0251 11/05/21 2057 11/08/21 0441 11/09/21 0540  NA 133* 138 139 139  K 3.9 3.6 3.2* 4.7  CL 92* 90* 91* 93*  CO2 34* 44* 42* 43*  GLUCOSE 140* 191* 143* 103*  BUN 9 15 17 12   CREATININE 0.54 0.43* 0.41* 0.30*  CALCIUM 8.5* 8.6* 8.6* 8.9  MG  --   --  2.3 2.3   GFR: Estimated Creatinine Clearance: 57.5 mL/min (A) (by C-G formula based on SCr of 0.3 mg/dL (L)). Liver Function Tests: No results for input(s): AST, ALT, ALKPHOS, BILITOT,  PROT, ALBUMIN in the last 168 hours. No results for input(s): LIPASE, AMYLASE in the last 168 hours. No results for input(s): AMMONIA in the last 168 hours. Coagulation Profile: No results for input(s): INR, PROTIME in the last 168 hours. Cardiac Enzymes: No results for input(s): CKTOTAL, CKMB, CKMBINDEX, TROPONINI in the last 168 hours. BNP (last 3 results) No results for input(s): PROBNP in the last 8760 hours. HbA1C: No results for input(s): HGBA1C in the last 72 hours. CBG: No results for input(s): GLUCAP in the last 168 hours. Lipid Profile: No results for input(s): CHOL, HDL, LDLCALC, TRIG, CHOLHDL, LDLDIRECT in the last 72 hours. Thyroid Function Tests: No results for input(s): TSH, T4TOTAL, FREET4, T3FREE, THYROIDAB in the last 72 hours. Anemia Panel: No  results for input(s): VITAMINB12, FOLATE, FERRITIN, TIBC, IRON, RETICCTPCT in the last 72 hours. Sepsis Labs: No results for input(s): PROCALCITON, LATICACIDVEN in the last 168 hours.  Recent Results (from the past 240 hour(s))  Resp Panel by RT-PCR (Flu A&B, Covid) Nasopharyngeal Swab     Status: None   Collection Time: 11/03/21  5:36 AM   Specimen: Nasopharyngeal Swab; Nasopharyngeal(NP) swabs in vial transport medium  Result Value Ref Range Status   SARS Coronavirus 2 by RT PCR NEGATIVE NEGATIVE Final    Comment: (NOTE) SARS-CoV-2 target nucleic acids are NOT DETECTED.  The SARS-CoV-2 RNA is generally detectable in upper respiratory specimens during the acute phase of infection. The lowest concentration of SARS-CoV-2 viral copies this assay can detect is 138 copies/mL. A negative result does not preclude SARS-Cov-2 infection and should not be used as the sole basis for treatment or other patient management decisions. A negative result may occur with  improper specimen collection/handling, submission of specimen other than nasopharyngeal swab, presence of viral mutation(s) within the areas targeted by this assay, and  inadequate number of viral copies(<138 copies/mL). A negative result must be combined with clinical observations, patient history, and epidemiological information. The expected result is Negative.  Fact Sheet for Patients:  EntrepreneurPulse.com.au  Fact Sheet for Healthcare Providers:  IncredibleEmployment.be  This test is no t yet approved or cleared by the [place] FDA and  has been authorized for detection and/or diagnosis of SARS-CoV-2 by FDA under an Emergency Use Authorization (EUA). This EUA will remain  in effect (meaning this test can be used) for the duration of the COVID-19 declaration under Section 564(b)(1) of the Act, 21 U.S.C.section 360bbb-3(b)(1), unless the authorization is terminated  or revoked sooner.       Influenza A by PCR NEGATIVE NEGATIVE Final   Influenza B by PCR NEGATIVE NEGATIVE Final    Comment: (NOTE) The Xpert Xpress SARS-CoV-2/FLU/RSV plus assay is intended as an aid in the diagnosis of influenza from Nasopharyngeal swab specimens and should not be used as a sole basis for treatment. Nasal washings and aspirates are unacceptable for Xpert Xpress SARS-CoV-2/FLU/RSV testing.  Fact Sheet for Patients: EntrepreneurPulse.com.au  Fact Sheet for Healthcare Providers: IncredibleEmployment.be  This test is not yet approved or cleared by the [place] FDA and has been authorized for detection and/or diagnosis of SARS-CoV-2 by FDA under an Emergency Use Authorization (EUA). This EUA will remain in effect (meaning this test can be used) for the duration of the COVID-19 declaration under Section 564(b)(1) of the Act, 21 U.S.C. section 360bbb-3(b)(1), unless the authorization is terminated or revoked.  Performed at [facility], [address] [place], [place] [address]   Resp Panel by RT-PCR (Flu A&B, Covid) Nasopharyngeal Swab     Status: None   Collection  Time: 11/05/21 10:02 PM   Specimen: Nasopharyngeal Swab; Nasopharyngeal(NP) swabs in vial transport medium  Result Value Ref Range Status   SARS Coronavirus 2 by RT PCR NEGATIVE NEGATIVE Final    Comment: (NOTE) SARS-CoV-2 target nucleic acids are NOT DETECTED.  The SARS-CoV-2 RNA is generally detectable in upper respiratory specimens during the acute phase of infection. The lowest concentration of SARS-CoV-2 viral copies this assay can detect is 138 copies/mL. A negative result does not preclude SARS-Cov-2 infection and should not be used as the sole basis for treatment or other patient management decisions. A negative result may occur with  improper specimen collection/handling, submission of specimen other than nasopharyngeal swab, presence of viral mutation(s)  within the areas targeted by this assay, and inadequate number of viral copies(<138 copies/mL). A negative result must be combined with clinical observations, patient history, and epidemiological information. The expected result is Negative.  Fact Sheet for Patients:  EntrepreneurPulse.com.au  Fact Sheet for Healthcare Providers:  IncredibleEmployment.be  This test is no t yet approved or cleared by the [place] FDA and  has been authorized for detection and/or diagnosis of SARS-CoV-2 by FDA under an Emergency Use Authorization (EUA). This EUA will remain  in effect (meaning this test can be used) for the duration of the COVID-19 declaration under Section 564(b)(1) of the Act, 21 U.S.C.section 360bbb-3(b)(1), unless the authorization is terminated  or revoked sooner.       Influenza A by PCR NEGATIVE NEGATIVE Final   Influenza B by PCR NEGATIVE NEGATIVE Final    Comment: (NOTE) The Xpert Xpress SARS-CoV-2/FLU/RSV plus assay is intended as an aid in the diagnosis of influenza from Nasopharyngeal swab specimens and should not be used as a sole basis for treatment. Nasal washings  and aspirates are unacceptable for Xpert Xpress SARS-CoV-2/FLU/RSV testing.  Fact Sheet for Patients: EntrepreneurPulse.com.au  Fact Sheet for Healthcare Providers: IncredibleEmployment.be  This test is not yet approved or cleared by the [place] FDA and has been authorized for detection and/or diagnosis of SARS-CoV-2 by FDA under an Emergency Use Authorization (EUA). This EUA will remain in effect (meaning this test can be used) for the duration of the COVID-19 declaration under Section 564(b)(1) of the Act, 21 U.S.C. section 360bbb-3(b)(1), unless the authorization is terminated or revoked.  Performed at [facility], [address] [place], [place] [address]       Imaging Studies   No results found.   Medications   Scheduled Meds:  apixaban  5 mg Oral BID   diltiazem  240 mg Oral QPM   fluticasone  2 spray Each Nare Daily   ipratropium  0.5 mg Nebulization Q6H   levalbuterol  1.25 mg Nebulization Q6H   mometasone-formoterol  2 puff Inhalation BID   multivitamin with minerals  1 tablet Oral Daily   polyethylene glycol  17 g Oral Daily   pravastatin  40 mg Oral q1800   predniSONE  40 mg Oral Q breakfast   senna-docusate  1 tablet Oral BID   Continuous Infusions:  azithromycin 500 mg (11/08/21 2026)       LOS: 4 days    Time spent: 30 minutes with > 50% spent at bedside and in coordination of care     Ezekiel Slocumb, DO [facility]  11/09/2021, 4:28 PM      If 7PM-7AM, please contact night-coverage. How to contact the [facility] Attending or Consulting provider [place] or covering provider during after hours [place], for this patient?    Check the care team in [facility] and look for a) attending/consulting [facility] provider listed and b) the [facility] team listed Log into [email or web address] and use [place]'s universal password to access. If you do not have the password, please contact the hospital operator. Locate  the [facility] provider you are looking for under [facility] and page to a number that you can be directly reached. If you still have difficulty reaching the provider, please page the [facility] (Director on Call) for the Hospitalists listed on amion for assistance.

## 2021-11-09 NOTE — Consult Note (Signed)
Counseling Ms. Fenter and her son on apixaban and gave coupon for a free 30-day trial.   Pt stated nobody spoke to her about starting apixaban or any medications. Unsure if that is true. I explained it probably was said but I am here to explain it in more detail. I explained the reason she is on it and the benefits of it.   Thanks,   Eleonore Chiquito, PharmD.

## 2021-11-09 NOTE — Care Management Important Message (Signed)
Important Message  Patient Details  Name: Margaret Stevenson MRN: [id number] Date of Birth: [birth year]   Medicare Important Message Given:  Yes     Dannette Barbara 11/09/2021, 3:02 PM

## 2021-11-09 NOTE — Progress Notes (Signed)
Progress Note  Patient Name: Margaret Stevenson Date of Encounter: 11/09/2021  [facility] Cardiologist: Kate Sable, MD   Subjective   Reports that she wore the flutter vest twice yesterday, Sputum/secretions breaking up, still with significant coughing Has not spent much time out of bed, Seen by PT, SNF recommended based on evaluation yesterday Telemetry reviewed, heart rate 80-90 Blood pressures 694-854 systolic  Inpatient Medications    Scheduled Meds:  apixaban  5 mg Oral BID   diltiazem  60 mg Oral Q6H   fluticasone  2 spray Each Nare Daily   ipratropium  0.5 mg Nebulization Q6H   levalbuterol  1.25 mg Nebulization Q6H   mometasone-formoterol  2 puff Inhalation BID   multivitamin with minerals  1 tablet Oral Daily   polyethylene glycol  17 g Oral Daily   pravastatin  40 mg Oral q1800   predniSONE  40 mg Oral Q breakfast   senna-docusate  1 tablet Oral BID   Continuous Infusions:  azithromycin 500 mg (11/08/21 2026)   PRN Meds: albuterol, bisacodyl, bisacodyl, nicotine, sodium chloride, sodium chloride flush   Vital Signs    Vitals:   11/09/21 0006 11/09/21 0755 11/09/21 1100 11/09/21 1150  BP: (!) 120/55 130/65  (!) 142/64  Pulse: 71 90  98  Resp: 18 17  20   Temp: (!) 97.5 F (36.4 C) 98 F (36.7 C)  97.9 F (36.6 C)  TempSrc:      SpO2: 99% 96% (!) 89% 94%    Intake/Output Summary (Last 24 hours) at 11/09/2021 1240 Last data filed at 11/09/2021 1153 Gross per 24 hour  Intake 1360 ml  Output 300 ml  Net 1060 ml   Last 3 Weights 11/03/2021 04/06/2020 01/26/2018  Weight (lbs) 130 lb 130 lb 137 lb 12.6 oz  Weight (kg) 58.968 kg 58.968 kg 62.5 kg      Telemetry    Atrial fibrillation rate 80-90- Personally Reviewed  ECG     - Personally Reviewed  Physical Exam   GEN: No acute distress.  Thin, coughing Neck: No JVD Cardiac:  irregularly irregular no murmurs, rubs, or gallops.  Respiratory: Coarse breath sounds bilaterally GI: Soft,  nontender, non-distended  MS: No edema; No deformity. Neuro:  Nonfocal  Psych: Normal affect   Labs    High Sensitivity Troponin:   Recent Labs  Lab 11/03/21 0252 11/03/21 0536 11/05/21 2057 11/06/21 0204  TROPONINIHS 5 5 8 6      Chemistry Recent Labs  Lab 11/05/21 2057 11/08/21 0441 11/09/21 0540  NA 138 139 139  K 3.6 3.2* 4.7  CL 90* 91* 93*  CO2 44* 42* 43*  GLUCOSE 191* 143* 103*  BUN 15 17 12   CREATININE 0.43* 0.41* 0.30*  CALCIUM 8.6* 8.6* 8.9  MG  --  2.3 2.3  GFRNONAA >60 >60 >60  ANIONGAP 4* 6 3*    Lipids No results for input(s): CHOL, TRIG, HDL, LABVLDL, LDLCALC, CHOLHDL in the last 168 hours.  Hematology Recent Labs  Lab 11/05/21 2057 11/08/21 0441 11/09/21 0540  WBC 10.5 11.4* 10.2  RBC 4.76 4.39 4.59  HGB 15.2* 13.7 14.2  HCT 50.6* 44.7 47.3*  MCV 106.3* 101.8* 103.1*  MCH 31.9 31.2 30.9  MCHC 30.0 30.6 30.0  RDW 13.0 12.9 13.1  PLT 205 191 194   Thyroid No results for input(s): TSH, FREET4 in the last 168 hours.  BNP Recent Labs  Lab 11/03/21 0252 11/05/21 2113  BNP 59.6 139.4*    DDimer No  results for input(s): DDIMER in the last 168 hours.   Radiology    No results found.  Cardiac Studies     Patient Profile     75 y.o. female w/ a h/o COPD and tob abuse, who was admitted 11/28 w/ resp failure and COPD w/ finding of PAF.  Assessment & Plan    COPD exacerbation/bronchitis Admitted with acute on chronic respiratory failure, emphysema on imaging Typically at baseline 2 to 3 L,  This morning on 6 L, using her flutter vest, significant thick secretions Feels that she needs a flutter vest, unable to get secretions up On nebulizers, steroids, Z-Pak -High risk of recurrent symptoms given severity of her underlying COPD/lung pathology -Needs outpatient follow-up with pulmonary   Paroxysmal atrial fibrillation Continue discussion concerning her atrial fibrillation, paroxysmal Dramatic improvement in frequency of atrial  fibrillation episodes -Tolerating diltiazem 60 every 6, will change to extended release 240 daily -Episodes of atrial fibrillation will continue to improve as lung function improves and bronchitis resolves -Tolerating Eliquis 5 twice daily  Coronary artery disease with stable angina Coronary disease noted on chest CT Normal ejection fraction on echo On Eliquis, statin Denies anginal symptoms at this time, no plan for ischemic work-up   Smoker Smoking cessation recommended, has severe COPD  Long discussion with patient and son at the bedside, Discussed underlying COPD exacerbation/bronchitis, effect on her cardiac condition, paroxysmal atrial fibrillation -Discussed each medication with her  [facility] will sign off.   Medication Recommendations: Diltiazem extended release 240 daily, Eliquis Other recommendations (labs, testing, etc): No further testing Follow up as an outpatient: We will arrange outpatient follow-up  For questions or updates, please contact [place] Please consult [email or web address] for contact info under        Signed, Ida Rogue, MD  11/09/2021, 12:40 PM

## 2021-11-10 DIAGNOSIS — J441 Chronic obstructive pulmonary disease with (acute) exacerbation: Secondary | ICD-10-CM | POA: Diagnosis not present

## 2021-11-10 DIAGNOSIS — I4891 Unspecified atrial fibrillation: Secondary | ICD-10-CM

## 2021-11-10 DIAGNOSIS — J9621 Acute and chronic respiratory failure with hypoxia: Secondary | ICD-10-CM | POA: Diagnosis not present

## 2021-11-10 DIAGNOSIS — I480 Paroxysmal atrial fibrillation: Secondary | ICD-10-CM | POA: Diagnosis not present

## 2021-11-10 LAB — BASIC METABOLIC PANEL
Anion gap: 7 (ref 5–15)
BUN: 13 mg/dL (ref 8–23)
CO2: 39 mmol/L — ABNORMAL HIGH (ref 22–32)
Calcium: 8.6 mg/dL — ABNORMAL LOW (ref 8.9–10.3)
Chloride: 90 mmol/L — ABNORMAL LOW (ref 98–111)
Creatinine, Ser: 0.32 mg/dL — ABNORMAL LOW (ref 0.44–1.00)
GFR, Estimated: >60 mL/min (ref >60)
Glucose, Bld: 140 mg/dL — ABNORMAL HIGH (ref 70–99)
Potassium: 5.3 mmol/L — ABNORMAL HIGH (ref 3.5–5.1)
Sodium: 136 mmol/L (ref 135–145)

## 2021-11-10 LAB — PROCALCITONIN: Procalcitonin: <0.1 ng/mL

## 2021-11-10 MED ORDER — DILTIAZEM HCL 30 MG PO TABS
60.0000 mg | ORAL_TABLET | Freq: Once | ORAL | Status: AC
  Administered 2021-11-10: 60 mg via ORAL
  Filled 2021-11-10: qty 2

## 2021-11-10 MED ORDER — SODIUM CHLORIDE 3 % IN NEBU
4.0000 mL | INHALATION_SOLUTION | Freq: Every day | RESPIRATORY_TRACT | Status: DC
  Administered 2021-11-10 – 2021-11-11 (×2): 4 mL via RESPIRATORY_TRACT
  Filled 2021-11-10 (×2): qty 4

## 2021-11-10 MED ORDER — LEVALBUTEROL HCL 0.63 MG/3ML IN NEBU
0.6300 mg | INHALATION_SOLUTION | RESPIRATORY_TRACT | Status: DC | PRN
  Administered 2021-11-10 – 2021-11-16 (×2): 0.63 mg via RESPIRATORY_TRACT
  Filled 2021-11-10 (×2): qty 3

## 2021-11-10 NOTE — Progress Notes (Signed)
PROGRESS NOTE    Margaret Stevenson   VEL:[id number]  DOB: 02/20/1946  PCP: Dion Body, MD    DOA: 11/05/2021 LOS: 5    Brief Narrative / Hospital Course to Date:   75 year old female with past medical history of COPD, previous melanoma and pulmonary nodules who was admitted on 11/05/2021 with difficulty breathing.  CT scan of the chest on 11/03/2021 after an ER visit that was negative for pulmonary embolism and showed stable pulmonary nodules.   Admitted with COPD exacerbation with acute hypoxic respiratory failure initially requiring BiPAP.  Started on steroids.  Patient also developed episodes of SVT.  Cardiology consulted.  Assessment & Plan   Principal Problem:   COPD with acute exacerbation ([place]) Active Problems:   Tobacco dependence   Acute on chronic respiratory failure with hypoxia (HCC)   Paroxysmal atrial fibrillation (HCC)   Malnutrition of moderate degree   Acute on chronic hypoxic respiratory failure - POA with shortness of breath, EMS recorded pulse ox in the 80s on her home oxygen.  Required BiPAP on arrival.   CTA chest 11/26 negative for PE. 11/30: Weaned to 5 L/min O2. 12/1-2: Increased oxygen requirement 8 to 9 L/min HFNC O2.  Attempted to wean to 6 L but desatted to upper 80s at rest. 12/3: sats maintained low 90's on 8-9 L/min O2  Paroxysmal A-fib - HR in the ED after admission was as high as 180s.  Initially thought to be SVT, no improvement with Valsalva or carotid massage.  Adenosine was considered, but rhythm improved prior to this being pushed.   Later given IV Cardizem push with improvement. Echo on 11/07/2021: EF 55 to 60%, indeterminate diastolic parameters, no WMA's --Cardiology consulted, see their recs --Started on Eliquis (CHA2DS2-VASc score 2 (age, gender)) --Rate controlled on PO Cardizem 60 mg q6h --Changed to Cardizem CD 240 mg p.o. daily --Optimize respiratory status   COPD exacerbation - POA.  Severe advanced COPD.  Follows  with Dr. Raul Del.   Has wheezing and poor air movement, on more O2 than yesterday.  --Continue Zithromax for ?bronchitis and anti-inflammatory --Solu-Medrol 60 mg IV BID --Continue Xopenex given tachycardia --Continue Atrovent and Dulera  --Consult Pulmonology, Dr. Raul Del, recs pending --Chest PT w vest --Pulmonary hygiene --Add saline nebs  Hypokalemia - replaced 12/1 for K 3.2.  Monitor and replace as needed.  Tobacco abuse - Nicotine patch PRN  Pulmonary nodules - PET scan April consistent with benign etiology.  But recommended following up with CT scans to monitor.  Constipation - bowel regimen ordered, including enema and/or suppository if needed.  Had BM overnight 12/1-2.    DVT prophylaxis:  apixaban (ELIQUIS) tablet 5 mg   Diet:  Diet Orders (From admission, onward)     Start     Ordered   11/05/21 2313  Diet regular Room service appropriate? Yes; Fluid consistency: Thin  Diet effective now       Question Answer Comment  Room service appropriate? Yes   Fluid consistency: Thin      11/05/21 2314              Code Status: DNR   Subjective 11/10/21    Pt seen with son at bedside this morning.  She reports feeling no better.  Not able to produce sputum even with PT vest.  Has her pulse oximeter showing 91% on 8 L/min, informed this is within her target range 88-94% but she states "that's too low" and asks oxygen to be turned up further.  She is upset with her meals here.  No other complaints at this time.  Does not open eyes to engage in conversation.   Disposition Plan & Communication   Status is: Inpatient  Remains inpatient appropriate because: on IV therapies and still requiring o2 supplementation above baseline.  Family communication - son at bedside on rounds 11/30, 12/1, 12/2  Consults, Procedures, Significant Events   Consultants:  None  Procedures:  none  Antimicrobials:  Anti-infectives (From admission, onward)    Start     Dose/Rate  Route Frequency Ordered Stop   11/07/21 2000  azithromycin (ZITHROMAX) 500 mg in sodium chloride 0.9 % 250 mL IVPB        500 mg 250 mL/hr over 60 Minutes Intravenous Every 24 hours 11/07/21 1926 11/11/21 2359         Micro    Objective   Vitals:   11/10/21 1132 11/10/21 1301 11/10/21 1515 11/10/21 1609  BP: 139/67  134/73   Pulse: 83 80 (!) 130 85  Resp: 18 16 18 18   Temp: 98.2 F (36.8 C)  98.3 F (36.8 C)   TempSrc:      SpO2: 92% 90% 92% 93%    Intake/Output Summary (Last 24 hours) at 11/10/2021 1752 Last data filed at 11/10/2021 1740 Gross per 24 hour  Intake 720 ml  Output 500 ml  Net 220 ml   There were no vitals filed for this visit.  Physical Exam:  General exam: awake, alert, no acute distress, frail, underweight, chronically ill-appearing Respiratory system: No wheezing, poor aeration but somewhat improved, normal respiratory effort at rest on 8 L/min O2 HFNC. Cardiovascular system: Regular rate and rhythm, no peripheral edema.   Central nervous system: A&O x3.  Normal speech.  Grossly nonfocal exam Psychiatry: normal mood but withdrawn, congruent affect, judgement and insight appear normal  Labs   Data Reviewed: I have personally reviewed following labs and imaging studies  CBC: Recent Labs  Lab 11/05/21 2057 11/08/21 0441 11/09/21 0540  WBC 10.5 11.4* 10.2  HGB 15.2* 13.7 14.2  HCT 50.6* 44.7 47.3*  MCV 106.3* 101.8* 103.1*  PLT 205 191 169   Basic Metabolic Panel: Recent Labs  Lab 11/05/21 2057 11/08/21 0441 11/09/21 0540 11/10/21 0519  NA 138 139 139 136  K 3.6 3.2* 4.7 5.3*  CL 90* 91* 93* 90*  CO2 44* 42* 43* 39*  GLUCOSE 191* 143* 103* 140*  BUN 15 17 12 13   CREATININE 0.43* 0.41* 0.30* 0.32*  CALCIUM 8.6* 8.6* 8.9 8.6*  MG  --  2.3 2.3  --    GFR: Estimated Creatinine Clearance: 57.5 mL/min (A) (by C-G formula based on SCr of 0.32 mg/dL (L)). Liver Function Tests: No results for input(s): AST, ALT, ALKPHOS, BILITOT, PROT,  ALBUMIN in the last 168 hours. No results for input(s): LIPASE, AMYLASE in the last 168 hours. No results for input(s): AMMONIA in the last 168 hours. Coagulation Profile: No results for input(s): INR, PROTIME in the last 168 hours. Cardiac Enzymes: No results for input(s): CKTOTAL, CKMB, CKMBINDEX, TROPONINI in the last 168 hours. BNP (last 3 results) No results for input(s): PROBNP in the last 8760 hours. HbA1C: No results for input(s): HGBA1C in the last 72 hours. CBG: No results for input(s): GLUCAP in the last 168 hours. Lipid Profile: No results for input(s): CHOL, HDL, LDLCALC, TRIG, CHOLHDL, LDLDIRECT in the last 72 hours. Thyroid Function Tests: No results for input(s): TSH, T4TOTAL, FREET4, T3FREE, THYROIDAB in the last 72 hours.  Anemia Panel: No results for input(s): VITAMINB12, FOLATE, FERRITIN, TIBC, IRON, RETICCTPCT in the last 72 hours. Sepsis Labs: Recent Labs  Lab 11/10/21 0519  PROCALCITON <0.10    Recent Results (from the past 240 hour(s))  Resp Panel by RT-PCR (Flu A&B, Covid) Nasopharyngeal Swab     Status: None   Collection Time: 11/03/21  5:36 AM   Specimen: Nasopharyngeal Swab; Nasopharyngeal(NP) swabs in vial transport medium  Result Value Ref Range Status   SARS Coronavirus 2 by RT PCR NEGATIVE NEGATIVE Final    Comment: (NOTE) SARS-CoV-2 target nucleic acids are NOT DETECTED.  The SARS-CoV-2 RNA is generally detectable in upper respiratory specimens during the acute phase of infection. The lowest concentration of SARS-CoV-2 viral copies this assay can detect is 138 copies/mL. A negative result does not preclude SARS-Cov-2 infection and should not be used as the sole basis for treatment or other patient management decisions. A negative result may occur with  improper specimen collection/handling, submission of specimen other than nasopharyngeal swab, presence of viral mutation(s) within the areas targeted by this assay, and inadequate number of  viral copies(<138 copies/mL). A negative result must be combined with clinical observations, patient history, and epidemiological information. The expected result is Negative.  Fact Sheet for Patients:  EntrepreneurPulse.com.au  Fact Sheet for Healthcare Providers:  IncredibleEmployment.be  This test is no t yet approved or cleared by the [place] FDA and  has been authorized for detection and/or diagnosis of SARS-CoV-2 by FDA under an Emergency Use Authorization (EUA). This EUA will remain  in effect (meaning this test can be used) for the duration of the COVID-19 declaration under Section 564(b)(1) of the Act, 21 U.S.C.section 360bbb-3(b)(1), unless the authorization is terminated  or revoked sooner.       Influenza A by PCR NEGATIVE NEGATIVE Final   Influenza B by PCR NEGATIVE NEGATIVE Final    Comment: (NOTE) The Xpert Xpress SARS-CoV-2/FLU/RSV plus assay is intended as an aid in the diagnosis of influenza from Nasopharyngeal swab specimens and should not be used as a sole basis for treatment. Nasal washings and aspirates are unacceptable for Xpert Xpress SARS-CoV-2/FLU/RSV testing.  Fact Sheet for Patients: EntrepreneurPulse.com.au  Fact Sheet for Healthcare Providers: IncredibleEmployment.be  This test is not yet approved or cleared by the [place] FDA and has been authorized for detection and/or diagnosis of SARS-CoV-2 by FDA under an Emergency Use Authorization (EUA). This EUA will remain in effect (meaning this test can be used) for the duration of the COVID-19 declaration under Section 564(b)(1) of the Act, 21 U.S.C. section 360bbb-3(b)(1), unless the authorization is terminated or revoked.  Performed at [facility], [address] [place],  [address]   Resp Panel by RT-PCR (Flu A&B, Covid) Nasopharyngeal Swab     Status: None   Collection Time: 11/05/21 10:02  PM   Specimen: Nasopharyngeal Swab; Nasopharyngeal(NP) swabs in vial transport medium  Result Value Ref Range Status   SARS Coronavirus 2 by RT PCR NEGATIVE NEGATIVE Final    Comment: (NOTE) SARS-CoV-2 target nucleic acids are NOT DETECTED.  The SARS-CoV-2 RNA is generally detectable in upper respiratory specimens during the acute phase of infection. The lowest concentration of SARS-CoV-2 viral copies this assay can detect is 138 copies/mL. A negative result does not preclude SARS-Cov-2 infection and should not be used as the sole basis for treatment or other patient management decisions. A negative result may occur with  improper specimen collection/handling, submission of specimen other than nasopharyngeal swab, presence  of viral mutation(s) within the areas targeted by this assay, and inadequate number of viral copies(<138 copies/mL). A negative result must be combined with clinical observations, patient history, and epidemiological information. The expected result is Negative.  Fact Sheet for Patients:  EntrepreneurPulse.com.au  Fact Sheet for Healthcare Providers:  IncredibleEmployment.be  This test is no t yet approved or cleared by the [place] FDA and  has been authorized for detection and/or diagnosis of SARS-CoV-2 by FDA under an Emergency Use Authorization (EUA). This EUA will remain  in effect (meaning this test can be used) for the duration of the COVID-19 declaration under Section 564(b)(1) of the Act, 21 U.S.C.section 360bbb-3(b)(1), unless the authorization is terminated  or revoked sooner.       Influenza A by PCR NEGATIVE NEGATIVE Final   Influenza B by PCR NEGATIVE NEGATIVE Final    Comment: (NOTE) The Xpert Xpress SARS-CoV-2/FLU/RSV plus assay is intended as an aid in the diagnosis of influenza from Nasopharyngeal swab specimens and should not be used as a sole basis for treatment. Nasal washings and aspirates are  unacceptable for Xpert Xpress SARS-CoV-2/FLU/RSV testing.  Fact Sheet for Patients: EntrepreneurPulse.com.au  Fact Sheet for Healthcare Providers: IncredibleEmployment.be  This test is not yet approved or cleared by the [place] FDA and has been authorized for detection and/or diagnosis of SARS-CoV-2 by FDA under an Emergency Use Authorization (EUA). This EUA will remain in effect (meaning this test can be used) for the duration of the COVID-19 declaration under Section 564(b)(1) of the Act, 21 U.S.C. section 360bbb-3(b)(1), unless the authorization is terminated or revoked.  Performed at [facility] [address], [place], [place] [address]       Imaging Studies   No results found.   Medications   Scheduled Meds:  apixaban  5 mg Oral BID   diltiazem  240 mg Oral QPM   fluticasone  2 spray Each Nare Daily   ipratropium  0.5 mg Nebulization Q6H   levalbuterol  1.25 mg Nebulization Q6H   methylPREDNISolone (SOLU-MEDROL) injection  60 mg Intravenous BID   mometasone-formoterol  2 puff Inhalation BID   multivitamin with minerals  1 tablet Oral Daily   polyethylene glycol  17 g Oral Daily   pravastatin  40 mg Oral q1800   senna-docusate  1 tablet Oral BID   sodium chloride HYPERTONIC  4 mL Nebulization Daily   Continuous Infusions:  azithromycin 500 mg (11/09/21 2332)       LOS: 5 days    Time spent: 30 minutes with > 50% spent at bedside and in coordination of care     Ezekiel Slocumb, DO [facility]  11/10/2021, 5:52 PM      If 7PM-7AM, please contact night-coverage. How to contact the [facility] Attending or Consulting provider [place] or covering provider during after hours [place], for this patient?    Check the care team in [facility] and look for a) attending/consulting [facility] provider listed and b) the [facility] team listed Log into [email or web address] and use 's universal password to access. If you do  not have the password, please contact the hospital operator. Locate the [facility] provider you are looking for under [facility] and page to a number that you can be directly reached. If you still have difficulty reaching the provider, please page the [facility] (Director on Call) for the Hospitalists listed on amion for assistance.

## 2021-11-10 NOTE — Progress Notes (Signed)
   11/10/21 1515  Assess: MEWS Score  Temp 98.3 F (36.8 C)  BP 134/73  Pulse Rate (!) 130  Resp 18  SpO2 92 %  O2 Device Nasal Cannula  Assess: MEWS Score  MEWS Temp 0  MEWS Systolic 0  MEWS Pulse 3  MEWS RR 0  MEWS LOC 0  MEWS Score 3  MEWS Score Color Yellow  Assess: if the MEWS score is Yellow or Red  Were vital signs taken at a resting state? Yes  Focused Assessment No change from prior assessment  Does the patient meet 2 or more of the SIRS criteria? No  MEWS guidelines implemented *See Row Information* Yes  Treat  MEWS Interventions Administered scheduled meds/treatments  Pain Scale 0-10  Pain Score 0  Take Vital Signs  Increase Vital Sign Frequency  Yellow: Q 2hr X 2 then Q 4hr X 2, if remains yellow, continue Q 4hrs  Escalate  MEWS: Escalate Yellow: discuss with charge nurse/RN and consider discussing with provider and RRT  Notify: Charge Nurse/RN  Name of Charge Nurse/RN Notified Kathlee Nations  Date Charge Nurse/RN Notified 11/10/21  Time Charge Nurse/RN Notified 1517  Notify: Provider  Provider Name/Title [place]  Date Provider Notified 11/10/21  Time Provider Notified 1500  Notification Type Page  Notification Reason Other (Comment) (Afib 130s-150s)  Provider response See new orders (Extra dose of PO Cardizem)  Date of Provider Response 11/10/21  Time of Provider Response 1518  Document  Patient Outcome Stabilized after interventions  Progress note created (see row info) Yes  Assess: SIRS CRITERIA  SIRS Temperature  0  SIRS Pulse 1  SIRS Respirations  0  SIRS WBC 0  SIRS Score Sum  1

## 2021-11-10 NOTE — Progress Notes (Signed)
   Progress Note   Subjective   Remains SOB.  Denies CP.  Now in AF with RVR but unaware.  Does not report feeling any worse with AF.  Inpatient Medications    Scheduled Meds:  apixaban  5 mg Oral BID   diltiazem  240 mg Oral QPM   fluticasone  2 spray Each Nare Daily   ipratropium  0.5 mg Nebulization Q6H   levalbuterol  1.25 mg Nebulization Q6H   methylPREDNISolone (SOLU-MEDROL) injection  60 mg Intravenous BID   mometasone-formoterol  2 puff Inhalation BID   multivitamin with minerals  1 tablet Oral Daily   polyethylene glycol  17 g Oral Daily   pravastatin  40 mg Oral q1800   senna-docusate  1 tablet Oral BID   sodium chloride HYPERTONIC  4 mL Nebulization Daily   Continuous Infusions:  azithromycin 500 mg (11/09/21 2332)   PRN Meds: bisacodyl, bisacodyl, levalbuterol, nicotine, sodium chloride, sodium chloride flush   Vital Signs    Vitals:   11/10/21 0755 11/10/21 1042 11/10/21 1132 11/10/21 1301  BP:  135/61 139/67   Pulse: 92 85 83 80  Resp: 18  18 16   Temp:   98.2 F (36.8 C)   TempSrc:      SpO2: 92% 91% 92% 90%    Intake/Output Summary (Last 24 hours) at 11/10/2021 1456 Last data filed at 11/10/2021 1100 Gross per 24 hour  Intake 720 ml  Output 500 ml  Net 220 ml   There were no vitals filed for this visit.  Telemetry    Afib with RVR this afternoon - Personally Reviewed  Physical Exam   GEN- The patient is ill appearing, sleeping but rouses, NAD, thin and frail Head- normocephalic, atraumatic Eyes-  Sclera clear, conjunctiva pink Ears- hearing intact Oropharynx- clear Neck- supple, Lungs-  normal work of breathing with prolonged expiratory phase, decreased BS Heart- tachycardic irregular rhythm GI- soft  Extremities- no clubbing, cyanosis, or edema  MS- diffuse atrophy Skin- no rash or lesion   Labs    Chemistry Recent Labs  Lab 11/08/21 0441 11/09/21 0540 11/10/21 0519  NA 139 139 136  K 3.2* 4.7 5.3*  CL 91* 93* 90*  CO2  42* 43* 39*  GLUCOSE 143* 103* 140*  BUN 17 12 13   CREATININE 0.41* 0.30* 0.32*  CALCIUM 8.6* 8.9 8.6*  GFRNONAA >60 >60 >60  ANIONGAP 6 3* 7     Hematology Recent Labs  Lab 11/05/21 2057 11/08/21 0441 11/09/21 0540  WBC 10.5 11.4* 10.2  RBC 4.76 4.39 4.59  HGB 15.2* 13.7 14.2  HCT 50.6* 44.7 47.3*  MCV 106.3* 101.8* 103.1*  MCH 31.9 31.2 30.9  MCHC 30.0 30.6 30.0  RDW 13.0 12.9 13.1  PLT 205 191 194     Patient ID  75 y.o. female w/ a h/o COPD and tob abuse, who was admitted 11/28 w/ resp failure and COPD w/ finding of PAF.  Assessment & Plan    1.  Paroxysmal atrial fibrillation Now back in afib This is likely secondary to respiratory failure, steroids, and frequent inhaled beta agonists. She is currently asymptomatic She is anticoagulated Will given diltiazem 60mg  PO x 1 now Hopefully she will convert back to sinus soon.  2. Severe COPD/ respiratory failure Likely driver for AF Per primary team  Thompson Grayer MD, [facility] 11/10/2021 2:56 PM

## 2021-11-10 NOTE — Plan of Care (Signed)
  Problem: Education: Goal: Knowledge of General Education information will improve Description: Including pain rating scale, medication(s)/side effects and non-pharmacologic comfort measures Outcome: Progressing   Problem: Clinical Measurements: Goal: Ability to maintain clinical measurements within normal limits will improve Outcome: Progressing   

## 2021-11-11 ENCOUNTER — Inpatient Hospital Stay: Payer: Medicare Other

## 2021-11-11 DIAGNOSIS — E876 Hypokalemia: Secondary | ICD-10-CM

## 2021-11-11 DIAGNOSIS — I480 Paroxysmal atrial fibrillation: Secondary | ICD-10-CM | POA: Diagnosis not present

## 2021-11-11 DIAGNOSIS — J9621 Acute and chronic respiratory failure with hypoxia: Secondary | ICD-10-CM | POA: Diagnosis not present

## 2021-11-11 DIAGNOSIS — F17200 Nicotine dependence, unspecified, uncomplicated: Secondary | ICD-10-CM | POA: Diagnosis not present

## 2021-11-11 DIAGNOSIS — J441 Chronic obstructive pulmonary disease with (acute) exacerbation: Secondary | ICD-10-CM | POA: Diagnosis not present

## 2021-11-11 LAB — BASIC METABOLIC PANEL
Anion gap: 5 (ref 5–15)
BUN: 16 mg/dL (ref 8–23)
CO2: 43 mmol/L — ABNORMAL HIGH (ref 22–32)
Calcium: 9.1 mg/dL (ref 8.9–10.3)
Chloride: 90 mmol/L — ABNORMAL LOW (ref 98–111)
Creatinine, Ser: 0.34 mg/dL — ABNORMAL LOW (ref 0.44–1.00)
GFR, Estimated: >60 mL/min (ref >60)
Glucose, Bld: 149 mg/dL — ABNORMAL HIGH (ref 70–99)
Potassium: 4.8 mmol/L (ref 3.5–5.1)
Sodium: 138 mmol/L (ref 135–145)

## 2021-11-11 LAB — BRAIN NATRIURETIC PEPTIDE: B Natriuretic Peptide: 96.4 pg/mL (ref 0.0–100.0)

## 2021-11-11 LAB — PROCALCITONIN: Procalcitonin: <0.1 ng/mL

## 2021-11-11 MED ORDER — SENNOSIDES-DOCUSATE SODIUM 8.6-50 MG PO TABS
1.0000 | ORAL_TABLET | Freq: Two times a day (BID) | ORAL | Status: DC
  Administered 2021-11-11: 22:00:00 1 via ORAL
  Filled 2021-11-11: qty 1

## 2021-11-11 MED ORDER — GUAIFENESIN ER 600 MG PO TB12
1200.0000 mg | ORAL_TABLET | Freq: Two times a day (BID) | ORAL | Status: DC
  Administered 2021-11-11 – 2021-11-27 (×33): 1200 mg via ORAL
  Filled 2021-11-11 (×33): qty 2

## 2021-11-11 MED ORDER — ACETYLCYSTEINE 20 % IN SOLN
4.0000 mL | Freq: Three times a day (TID) | RESPIRATORY_TRACT | Status: AC
  Administered 2021-11-11 – 2021-11-13 (×4): 4 mL via RESPIRATORY_TRACT
  Filled 2021-11-11 (×6): qty 4

## 2021-11-11 MED ORDER — POLYETHYLENE GLYCOL 3350 17 G PO PACK
17.0000 g | PACK | Freq: Two times a day (BID) | ORAL | Status: DC
  Administered 2021-11-11: 22:00:00 17 g via ORAL
  Filled 2021-11-11: qty 1

## 2021-11-11 MED ORDER — IPRATROPIUM-ALBUTEROL 0.5-2.5 (3) MG/3ML IN SOLN: 3.0000 mL | RESPIRATORY_TRACT | Status: DC

## 2021-11-11 MED ORDER — BUDESONIDE 0.5 MG/2ML IN SUSP
0.5000 mg | Freq: Two times a day (BID) | RESPIRATORY_TRACT | Status: DC
  Administered 2021-11-11 – 2021-11-15 (×9): 0.5 mg via RESPIRATORY_TRACT
  Filled 2021-11-11 (×10): qty 2

## 2021-11-11 NOTE — Progress Notes (Signed)
Patient very resistant to activity and or mobility. Shows no interest in trying to be more mobile even with the assistance of nursing staff. Day RN had conversation with patient regarding having bowel movements in bed instead of calling for bed pan. Nursing will continue to encourage mobility and participation in activities of daily living as the patient can tolerate.

## 2021-11-11 NOTE — Progress Notes (Signed)
Pt placed on [place] per CCM due to increased WOB & pt stating that she doesn't feel like she is getting enough air

## 2021-11-11 NOTE — Plan of Care (Signed)
  Problem: Education: Goal: Knowledge of General Education information will improve Description: Including pain rating scale, medication(s)/side effects and non-pharmacologic comfort measures Outcome: Progressing   Problem: Health Behavior/Discharge Planning: Goal: Ability to manage health-related needs will improve Outcome: Progressing   Problem: Activity: Goal: Risk for activity intolerance will decrease Outcome: Not Progressing

## 2021-11-11 NOTE — Consult Note (Addendum)
NAME:  Margaret Stevenson, MRN:  [id number], DOB:  08-05-1946, LOS: 6 ADMISSION DATE:  11/05/2021, CONSULTATION DATE: 11/11/2021 REFERRING MD: Irine Seal, MD, CHIEF COMPLAINT: Persistent hypoxia  Brief Patient Report  75 y.o. female w/ a h/o COPD and tob abuse, who was admitted 11/28 w/ resp failure and COPD w/ finding of PAF. HPI  75 year old female with past medical history of COPD, previous melanoma and pulmonary nodules who was admitted on 11/05/2021 with difficulty breathing.  CT scan of the chest on 11/03/2021 after an ER visit that was negative for pulmonary embolism and showed stable pulmonary nodules. She presented back to the ED with worsening respiratory symptoms.  ED Course: On arrival to the ED, she was afebrile with blood pressure 133/52 mm Hg and pulse rate  100 beats/min, RR 21, Sats 98% on 100% BiPAP. Pertinent Labs in Red/Diagnostics Findings: WBC 10.5, hemoglobin 15.2, troponin 8, bicarb 44, glucose 191, otherwise unremarkable EKG: sinus tachycardia. CXR: no acute disease.  Emphysema Patient treated with duo nebs and Solu-Medrol but continued to have increased work of breathing. Patient admitted to progressive care unit.  Hospital Course: During the course of admission patient developed episode of SVT with rates as high as 180s.  Valsalva maneuver and carotid massage performed with improvement.  Adenosine was considered but rhythm improved prior to being administered.  Patient later given IV Cardizem push with improvement.  Cardiology consulted patient started on Eliquis and p.o. Cardizem 30 mg every 6 hours with plans to switch to CD before DC.  Patient continued to require increased oxygenation from 89 L HFNC O2.  Weaning attempts was made to 6 L but patient desatted to low 80s at rest.  This morning patient continues to complain of shortness of breath states she is satting in the 90s but feels like she is satting in the 80s.  Due to increased oxygen requirement with  persistent hypoxia PCCM consulted to assist with management.  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   Elevated CA 19-9 level 05/08/2017   Arthritis 05/07/2017   Asthma without status asthmaticus 05/07/2017   Borderline diabetes 05/07/2017   Hx of adenomatous colonic polyps 05/07/2017   Tobacco dependence 05/07/2017   Sepsis ([place]) 04/27/2017   Disease of biliary tract     Obstruction of bile duct     Jaundice     Hypoxia 10/11/2016   Anxiety 10/11/2016   COPD (chronic obstructive pulmonary disease) ([place]) 10/06/2016   CAP (community acquired pneumonia) 10/06/2016   Hyperglycemia 10/06/2016   Vaccine counseling 06/21/2016   History of malignant melanoma of skin 08/03/2013    Significant Hospital Events   11/28: Admitted to progressive care unit with acute on chronic respiratory failure secondary to COPD exacerbation/anxiety 11/30: Continue to wean oxygen to 5 L/min. Cardiology consulted for Afib with RVR 12/1: Patient with increased oxygen requirement 8 to 9 L HFNC failed weaning. 12/2: Remains on high flow nasal cannula at 8 to 9 L to keep sats greater than 90% 12/3: Still requiring high oxygen also in A.  fib with RVR 12/4:  PCCM consulted Consults:  Cardiology PCCM  Procedures:  None  Significant Diagnostic Tests:  11/28: Chest Xray> no acute cardiopulmonary process, emphysema 12/4: 7 mm right upper lobe pulmonary nodule, better demonstrated on recent chest CT. 2. Peribronchial opacities with lower lobe predominance may represent bronchitic changes or interstitial pulmonary edema. 3.  Emphysema  Micro Data:  11/28: SARS-CoV-2 PCR> negative 11/28: Influenza PCR> negative  Antimicrobials:  None  OBJECTIVE  Blood pressure (!) 146/61, pulse 78, temperature 98.3 F (36.8 C), resp. rate 18, SpO2 96 %.        Intake/Output Summary (Last 24 hours) at 11/11/2021 1219 Last data filed at 11/11/2021 0830 Gross per 24 hour  Intake 240 ml  Output 800 ml  Net -560 ml   There were no vitals filed for this visit.  Physical Examination  GENERAL: 75 year-old patient lying in the bed with no acute distress.  EYES: Pupils equal, round, reactive to light and accommodation. No scleral icterus. Extraocular muscles intact.  HEENT: Head atraumatic, normocephalic. Oropharynx and nasopharynx clear.  NECK:  Supple, no jugular venous distention. No thyroid enlargement, no tenderness.  LUNGS: Decreased breath sounds bilaterally, no wheezing, rales,rhonchi or crepitation. No use of accessory muscles of respiration.  CARDIOVASCULAR: S1, S2 normal. No murmurs, rubs, or gallops.  ABDOMEN: Soft, nontender, nondistended. Bowel sounds present. No organomegaly or mass.  EXTREMITIES: No pedal edema, cyanosis, or clubbing.  NEUROLOGIC: Cranial nerves II through XII are intact.  Muscle strength 5/5 in all extremities. Sensation intact. Gait not checked.  PSYCHIATRIC: The patient is alert and oriented x 3.  SKIN: No obvious rash, lesion, or ulcer.   Labs/imaging that I havepersonally reviewed  (right click and "Reselect all SmartList Selections" daily)     Labs   CBC: Recent Labs  Lab 11/05/21 2057 11/08/21 0441 11/09/21 0540  WBC 10.5 11.4* 10.2  HGB 15.2* 13.7 14.2  HCT 50.6* 44.7 47.3*  MCV 106.3* 101.8* 103.1*  PLT 205 191 606    Basic Metabolic Panel: Recent Labs  Lab 11/05/21 2057 11/08/21 0441 11/09/21 0540 11/10/21 0519 11/11/21 0551  NA 138 139 139 136 138  K 3.6 3.2* 4.7 5.3* 4.8  CL 90* 91* 93* 90* 90*  CO2 44* 42* 43* 39* 43*  GLUCOSE 191* 143* 103* 140* 149*   BUN 15 17 12 13 16   CREATININE 0.43* 0.41* 0.30* 0.32* 0.34*  CALCIUM 8.6* 8.6* 8.9 8.6* 9.1  MG  --  2.3 2.3  --   --    GFR: Estimated Creatinine Clearance: 57.5 mL/min (A) (by C-G formula based on SCr of 0.34 mg/dL (L)). Recent Labs  Lab 11/05/21 2057 11/08/21 0441 11/09/21 0540 11/10/21 0519 11/11/21 0551  PROCALCITON  --   --   --  <0.10 <0.10  WBC 10.5 11.4* 10.2  --   --     Liver Function Tests: No results for input(s): AST, ALT, ALKPHOS, BILITOT, PROT, ALBUMIN in the last 168 hours. No results for input(s): LIPASE, AMYLASE in the last 168 hours. No results for input(s): AMMONIA in the last 168 hours.  ABG No results found for: PHART, PCO2ART, PO2ART, HCO3, TCO2, ACIDBASEDEF, O2SAT   Coagulation Profile: No results for input(s): INR, PROTIME in the last 168 hours.  Cardiac Enzymes: No results for input(s): CKTOTAL, CKMB, CKMBINDEX, TROPONINI in the last 168 hours.  HbA1C: Hgb A1c MFr Bld  Date/Time Value Ref Range Status  04/27/2017 12:17 AM 4.5 (L) 4.8 - 5.6 % Final  Comment:    (NOTE)         Pre-diabetes: 5.7 - 6.4         Diabetes: >6.4         Glycemic control for adults with diabetes: <7.0     CBG: No results for input(s): GLUCAP in the last 168 hours.  Review of Systems:   Review of Systems  Constitutional: Negative.   HENT: Negative.    Eyes: Negative.   Respiratory:  Positive for cough, sputum production, shortness of breath and wheezing. Negative for hemoptysis.   Cardiovascular:  Positive for palpitations, orthopnea and leg swelling. Negative for chest pain, claudication and PND.  Gastrointestinal: Negative.   Genitourinary: Negative.   Musculoskeletal:  Positive for back pain and neck pain.  Skin: Negative.   Neurological: Negative.   Endo/Heme/Allergies: Negative.   Psychiatric/Behavioral:  The patient is nervous/anxious.     Past Medical History  She,  has a past medical history of Arthritis, Cancer ([place]), COPD (chronic  obstructive pulmonary disease) ([place]), Dyspnea, and Jaundice (04/2017).   Surgical History    Past Surgical History:  Procedure Laterality Date   BREAST BIOPSY Right 08/26/2018   Affirm Bx ("x" clip) path pending   BUNIONECTOMY     COLONOSCOPY     COLONOSCOPY WITH PROPOFOL N/A 05/26/2017   Procedure: COLONOSCOPY WITH PROPOFOL;  Surgeon: Lucilla Lame, MD;  Location: [facility];  Service: Endoscopy;  Laterality: N/A;  please do not move appt time   ERCP N/A 04/25/2017   Procedure: ENDOSCOPIC RETROGRADE CHOLANGIOPANCREATOGRAPHY (ERCP);  Surgeon: Lucilla Lame, MD;  Location: [facility] ENDOSCOPY;  Service: Endoscopy;  Laterality: N/A;   ERCP N/A 09/30/2017   Procedure: ENDOSCOPIC RETROGRADE CHOLANGIOPANCREATOGRAPHY (ERCP) Pancreatic stent removal;  Surgeon: Lucilla Lame, MD;  Location: [facility] ENDOSCOPY;  Service: Endoscopy;  Laterality: N/A;   POLYPECTOMY  05/26/2017   Procedure: POLYPECTOMY;  Surgeon: Lucilla Lame, MD;  Location: [place];  Service: Endoscopy;;   TONSILLECTOMY       Social History   reports that she quit smoking 8 days ago. Her smoking use included cigarettes. She has a 28.50 pack-year smoking history. She has never used smokeless tobacco. She reports current alcohol use of about 7.0 standard drinks per week. She reports that she does not use drugs.   Family History   Her family history includes Breast cancer (age of onset: 63) in her mother.   Allergies Allergies  Allergen Reactions   Penicillins Hives    Has patient had a PCN reaction causing immediate rash, facial/tongue/throat swelling, SOB or lightheadedness with hypotension: no Has patient had a PCN reaction causing severe rash involving mucus membranes or skin necrosis: no Has patient had a PCN reaction that required hospitalization no Has patient had a PCN reaction occurring within the last 10 years: no If all of the above answers are "NO", then may proceed with Cephalosporin use.      Home  Medications  Prior to Admission medications   Medication Sig Start Date End Date Taking? Authorizing Provider  albuterol (PROVENTIL) (2.5 MG/3ML) 0.083% nebulizer solution Take 3 mLs (2.5 mg total) by nebulization every 4 (four) hours as needed for wheezing or shortness of breath. 11/03/21 11/03/22  Naaman Plummer, MD  albuterol (VENTOLIN HFA) 108 (90 Base) MCG/ACT inhaler Inhale 2 puffs into the lungs every 4 (four) hours as needed for shortness of breath.  08/27/16 01/24/19  [provider]  aspirin EC 81 MG tablet Take 81 mg by mouth daily. 10/11/21  [provider]  Fluticasone-Salmeterol (ADVAIR) 250-50 MCG/DOSE AEPB Inhale 1 puff into the lungs 2 (two) times daily.    [provider]  lovastatin (MEVACOR) 40 MG tablet Take 40 mg by mouth daily with supper. 10/11/21   [provider]  OXYGEN Inhale 3 L into the lungs. 1 L when not active    [provider]  tiotropium (SPIRIVA) 18 MCG inhalation capsule Place 18 mcg into inhaler and inhale at bedtime.  08/27/16 01/24/19  [provider]  TURMERIC PO Take by mouth.    [provider]    Scheduled Meds:  acetylcysteine  4 mL Nebulization TID   apixaban  5 mg Oral BID   budesonide (PULMICORT) nebulizer solution  0.5 mg Nebulization BID   diltiazem  240 mg Oral QPM   fluticasone  2 spray Each Nare Daily   guaiFENesin  1,200 mg Oral BID   ipratropium  0.5 mg Nebulization Q6H   levalbuterol  1.25 mg Nebulization Q6H   methylPREDNISolone (SOLU-MEDROL) injection  60 mg Intravenous BID   multivitamin with minerals  1 tablet Oral Daily   polyethylene glycol  17 g Oral BID   pravastatin  40 mg Oral q1800   senna-docusate  1 tablet Oral BID   Continuous Infusions:  azithromycin 500 mg (11/10/21 2141)   PRN Meds:.bisacodyl, bisacodyl, levalbuterol, nicotine, sodium chloride, sodium chloride flush  Assessment & Plan:  Acute on chronic hypercapnic hypoxic respiratory failure secondary  to AECOPD  PMHx: Asthma, COPD on Chronic home oxygen at 3L, current everyday smoker  -Supplemental oxygen as needed, maintain SpO2 > 88% -Intermittent chest x-ray & ABG PRN -Ensure adequate pulmonary hygiene -Continue Bronchodilators as needed -ICS >> Pulmicort BID -LAMA >> Duoneb TID substitute albuterol in the setting of A. fib RVR -Continue chest physiotherapy, will add Mucomyst 3 times daily -Methylprednisolone IV 40 BID -Consider IV Azithromycin 500mg  daily x 3 days -Follow cultures  -Trend PCT, monitor WBC/ fever curve -consider NPPV BIPAP PRN -Smoking cessation education once stabilized  Paroxysmal atrial fibrillation Runs of SVT New since admission likely in the setting of AECOPD as above -Continue diltiazem 240 daily for rate control and as BP permits -Continue Eliquis 5mg  BID  CHA2DS2VASc  -If uncontrolled with above plan for AAD per cardio recommendations -Replace electrolytes,  Hyperglycemia likely steroid-induced -Check hemoglobin A1c -CBGs -Sliding scale insulin -Follow hyper/hypoglycemia protocol ` Best practice:  Diet:  Oral Pain/Anxiety/Delirium protocol (if indicated): No VAP protocol (if indicated): Not indicated DVT prophylaxis: Systemic AC GI prophylaxis: N/A Glucose control:  SSI Yes Central venous access:  N/A Arterial line:  N/A Foley:  N/A Mobility:  bed rest  PT consulted: Yes Last date of multidisciplinary goals of care discussion [12/4] Code Status:  DNR Disposition: Progressive Care Unit   = Goals of Care = Code Status Order: @CODE @   Primary Emergency ContactJanda, [place], Home Phone: [phone] Patient wishes to pursue ongoing treatment, but concurred that if deteriorated to pulselessness, patient would prefer a natural death as opposed to invasive measures such as CPR and intubation.     Rufina Falco, DNP, CCRN, FNP-C, AGACNP-BC Acute Care Nurse Practitioner  [facility] Pager:  [phone] [place] [facility]  ICU ATTENDING ATTESTATION:  I agree with the assessment and plan of care as outlined by Rufina Falco NP.  This patient was not seen as a shared visit. The following reflects my independent critical care time.  I  personally  reviewed database in its entirety  and discussed care plan in detail. In addition, this patient was discussed on multidisciplinary rounds.   I agree with assessment and plan.   Corrin Parker, M.D.  [facility]  Medical Director [place] Director [facility] Cardio-Pulmonary Department

## 2021-11-11 NOTE — Progress Notes (Signed)
PROGRESS NOTE    Margaret Stevenson  VHQ:[id number] DOB: 08/30/1946 DOA: 11/05/2021 PCP: Dion Body, MD    Chief Complaint  Patient presents with   Respiratory Distress    Brief Narrative:  75 year old female with past medical history of COPD, previous melanoma and pulmonary nodules who was admitted on 11/05/2021 with difficulty breathing.  CT scan of the chest on 11/03/2021 after an ER visit that was negative for pulmonary embolism and showed stable pulmonary nodules.    Admitted with COPD exacerbation with acute hypoxic respiratory failure initially requiring BiPAP.  Started on steroids.  Patient also developed episodes of SVT/atrial fibrillation.  Cardiology consulted. -Patient with increasing O2 requirements with worsening hypoxia and as such PCCM/pulmonary consulted.   Assessment & Plan:   Principal Problem:   COPD with acute exacerbation ([place]) Active Problems:   Tobacco dependence   Acute on chronic respiratory failure with hypoxia (HCC)   Paroxysmal atrial fibrillation (HCC)   Malnutrition of moderate degree  #1 acute on chronic hypoxic respiratory failure secondary to COPD exacerbation, POA -Patient presented with worsening shortness of breath, EMS noted patient to have a sats of 80% on her home O2 required BiPAP on arrival. -Patient noted to have a CT angiogram chest done 11/03/2021 which was negative for PE. -11/07/2021 patient weaned down to about 5 L O2. -12/1-2 week patient with increased O2 requirements of 8 to 9 L high flow nasal cannula and attempt to wean down to 6 L but patient desatted in the upper 80s. -12/3 with sats in the low 90s on 8 to 9 L O2. -Patient still on 8 L high flow nasal cannula with sats 95 to 100%.  Patient noted to RT to be refusing for O2 to be weaned down stating that she feels like her sats about 74%. -Patient with severe advanced COPD being followed by Dr. Raul Del noted to have some poor air movement. -Continue azithromycin, IV  Solu-Medrol, Xopenex and Atrovent nebs, Dulera.  Continue chest PT.  Pulmonary hygiene.  Saline nebs. -Add Pulmicort. -Increase Mucinex to 1200 mg twice daily. -Due to ongoing hypoxia will consult with PCCM/pulmonary for further evaluation and management.  2.  Paroxysmal A. fib -Patient noted in the ED after admission to have heart rates in the 180s initially thought to be SVT with no improvement with Valsalva or carotid massage. -Adenosine considered but rhythm improved prior to adenosine being given. -Cardiology consulted and patient did receive a dose of IV Cardizem push with some improvement. -2D echo with EF of 55 to 60%, indeterminate diastolic parameters,NWMA. -Patient converted to sinus rhythm today. -Continue Cardizem CD 240 mg daily. -Eliquis for anticoagulation. -Continue management of problem #1. -Cardiology following and appreciate input and recommendations.  3.  Hypokalemia -Repleted  4.  Tobacco abuse -Tobacco cessation. -Nicotine patch as needed.  5.  Pulmonary nodules -PET scan done in April consistent with benign etiology. -Outpatient follow-up.  6.  Constipation -Change MiraLAX to twice daily, placed on Senokot-S twice daily.    DVT prophylaxis: Eliquis Code Status: DNR Family Communication: Updated patient.  No family at bedside Disposition:   Status is: Inpatient  Remains inpatient appropriate because: Severity of illness       Consultants:  Cardiology: Dr.Agbor-Etang 11/07/2021 PCCM/pulmonary pending  Procedures:  Chest x-ray 11/05/2021, 11/11/2021 2D echo 11/07/2021  Antimicrobials:  IV azithromycin 11/07/2021>>>> 11/11/2021   Subjective: Patient laying in bed watching wild Game between friends in [place].  Patient states O2 sats read 94% on 8 L however feels as  sats at 74% currently.  Currently on 7 L and asking whether that needs to be increased.  Complain of some chest tightness.  No abdominal pain.  She feels O2 needs to be increased.   Put some pulse ox on 7 L and O2 going from 88 to 91%.  Patient noted to be refusing per respiratory therapy for O2 to be weaned down at this time.  Patient back in normal sinus rhythm.  Objective: Vitals:   11/11/21 0442 11/11/21 0456 11/11/21 0803 11/11/21 0812  BP: (!) 121/54   130/60  Pulse: 67  68 78  Resp: 16  18 18   Temp: 97.8 F (36.6 C)   98.5 F (36.9 C)  TempSrc:      SpO2: 97% 95% 98% 100%    Intake/Output Summary (Last 24 hours) at 11/11/2021 1058 Last data filed at 11/11/2021 0500 Gross per 24 hour  Intake 360 ml  Output 950 ml  Net -590 ml   There were no vitals filed for this visit.  Examination:  General exam: Appears calm and comfortable  Respiratory system: Clear to auscultation anterior lung fields.  No significant wheezes noted.  Fair air movement.  Speaking in full sentences.Marland Kitchen Respiratory effort normal. Cardiovascular system: S1 & S2 heard, RRR. No JVD, murmurs, rubs, gallops or clicks Gastrointestinal system: Abdomen is nondistended, soft and nontender. No organomegaly or masses felt. Normal bowel sounds heard. Central nervous system: Alert and oriented. No focal neurological deficits. Extremities: Symmetric 5 x 5 power. Skin: No rashes, lesions or ulcers Psychiatry: Judgement and insight appear normal. Mood & affect appropriate.     Data Reviewed: I have personally reviewed following labs and imaging studies  CBC: Recent Labs  Lab 11/05/21 2057 11/08/21 0441 11/09/21 0540  WBC 10.5 11.4* 10.2  HGB 15.2* 13.7 14.2  HCT 50.6* 44.7 47.3*  MCV 106.3* 101.8* 103.1*  PLT 205 191 606    Basic Metabolic Panel: Recent Labs  Lab 11/05/21 2057 11/08/21 0441 11/09/21 0540 11/10/21 0519 11/11/21 0551  NA 138 139 139 136 138  K 3.6 3.2* 4.7 5.3* 4.8  CL 90* 91* 93* 90* 90*  CO2 44* 42* 43* 39* 43*  GLUCOSE 191* 143* 103* 140* 149*  BUN 15 17 12 13 16   CREATININE 0.43* 0.41* 0.30* 0.32* 0.34*  CALCIUM 8.6* 8.6* 8.9 8.6* 9.1  MG  --  2.3 2.3   --   --     GFR: Estimated Creatinine Clearance: 57.5 mL/min (A) (by C-G formula based on SCr of 0.34 mg/dL (L)).  Liver Function Tests: No results for input(s): AST, ALT, ALKPHOS, BILITOT, PROT, ALBUMIN in the last 168 hours.  CBG: No results for input(s): GLUCAP in the last 168 hours.   Recent Results (from the past 240 hour(s))  Resp Panel by RT-PCR (Flu A&B, Covid) Nasopharyngeal Swab     Status: None   Collection Time: 11/03/21  5:36 AM   Specimen: Nasopharyngeal Swab; Nasopharyngeal(NP) swabs in vial transport medium  Result Value Ref Range Status   SARS Coronavirus 2 by RT PCR NEGATIVE NEGATIVE Final    Comment: (NOTE) SARS-CoV-2 target nucleic acids are NOT DETECTED.  The SARS-CoV-2 RNA is generally detectable in upper respiratory specimens during the acute phase of infection. The lowest concentration of SARS-CoV-2 viral copies this assay can detect is 138 copies/mL. A negative result does not preclude SARS-Cov-2 infection and should not be used as the sole basis for treatment or other patient management decisions. A negative result may occur with  improper specimen collection/handling, submission of specimen other than nasopharyngeal swab, presence of viral mutation(s) within the areas targeted by this assay, and inadequate number of viral copies(<138 copies/mL). A negative result must be combined with clinical observations, patient history, and epidemiological information. The expected result is Negative.  Fact Sheet for Patients:  EntrepreneurPulse.com.au  Fact Sheet for Healthcare Providers:  IncredibleEmployment.be  This test is no t yet approved or cleared by the [place] FDA and  has been authorized for detection and/or diagnosis of SARS-CoV-2 by FDA under an Emergency Use Authorization (EUA). This EUA will remain  in effect (meaning this test can be used) for the duration of the COVID-19 declaration under Section  564(b)(1) of the Act, 21 U.S.C.section 360bbb-3(b)(1), unless the authorization is terminated  or revoked sooner.       Influenza A by PCR NEGATIVE NEGATIVE Final   Influenza B by PCR NEGATIVE NEGATIVE Final    Comment: (NOTE) The Xpert Xpress SARS-CoV-2/FLU/RSV plus assay is intended as an aid in the diagnosis of influenza from Nasopharyngeal swab specimens and should not be used as a sole basis for treatment. Nasal washings and aspirates are unacceptable for Xpert Xpress SARS-CoV-2/FLU/RSV testing.  Fact Sheet for Patients: EntrepreneurPulse.com.au  Fact Sheet for Healthcare Providers: IncredibleEmployment.be  This test is not yet approved or cleared by the [place] FDA and has been authorized for detection and/or diagnosis of SARS-CoV-2 by FDA under an Emergency Use Authorization (EUA). This EUA will remain in effect (meaning this test can be used) for the duration of the COVID-19 declaration under Section 564(b)(1) of the Act, 21 U.S.C. section 360bbb-3(b)(1), unless the authorization is terminated or revoked.  Performed at [facility], [address] [place], [place] [address]   Resp Panel by RT-PCR (Flu A&B, Covid) Nasopharyngeal Swab     Status: None   Collection Time: 11/05/21 10:02 PM   Specimen: Nasopharyngeal Swab; Nasopharyngeal(NP) swabs in vial transport medium  Result Value Ref Range Status   SARS Coronavirus 2 by RT PCR NEGATIVE NEGATIVE Final    Comment: (NOTE) SARS-CoV-2 target nucleic acids are NOT DETECTED.  The SARS-CoV-2 RNA is generally detectable in upper respiratory specimens during the acute phase of infection. The lowest concentration of SARS-CoV-2 viral copies this assay can detect is 138 copies/mL. A negative result does not preclude SARS-Cov-2 infection and should not be used as the sole basis for treatment or other patient management decisions. A negative result may occur with  improper  specimen collection/handling, submission of specimen other than nasopharyngeal swab, presence of viral mutation(s) within the areas targeted by this assay, and inadequate number of viral copies(<138 copies/mL). A negative result must be combined with clinical observations, patient history, and epidemiological information. The expected result is Negative.  Fact Sheet for Patients:  EntrepreneurPulse.com.au  Fact Sheet for Healthcare Providers:  IncredibleEmployment.be  This test is no t yet approved or cleared by the [place] FDA and  has been authorized for detection and/or diagnosis of SARS-CoV-2 by FDA under an Emergency Use Authorization (EUA). This EUA will remain  in effect (meaning this test can be used) for the duration of the COVID-19 declaration under Section 564(b)(1) of the Act, 21 U.S.C.section 360bbb-3(b)(1), unless the authorization is terminated  or revoked sooner.       Influenza A by PCR NEGATIVE NEGATIVE Final   Influenza B by PCR NEGATIVE NEGATIVE Final    Comment: (NOTE) The Xpert Xpress SARS-CoV-2/FLU/RSV plus assay is intended as an aid in the diagnosis  of influenza from Nasopharyngeal swab specimens and should not be used as a sole basis for treatment. Nasal washings and aspirates are unacceptable for Xpert Xpress SARS-CoV-2/FLU/RSV testing.  Fact Sheet for Patients: EntrepreneurPulse.com.au  Fact Sheet for Healthcare Providers: IncredibleEmployment.be  This test is not yet approved or cleared by the [place] FDA and has been authorized for detection and/or diagnosis of SARS-CoV-2 by FDA under an Emergency Use Authorization (EUA). This EUA will remain in effect (meaning this test can be used) for the duration of the COVID-19 declaration under Section 564(b)(1) of the Act, 21 U.S.C. section 360bbb-3(b)(1), unless the authorization is terminated or revoked.  Performed at  [facility], [address], [place], [place] [address]          Radiology Studies: No results found.      Scheduled Meds:  apixaban  5 mg Oral BID   budesonide (PULMICORT) nebulizer solution  0.5 mg Nebulization BID   diltiazem  240 mg Oral QPM   fluticasone  2 spray Each Nare Daily   guaiFENesin  1,200 mg Oral BID   ipratropium  0.5 mg Nebulization Q6H   ipratropium-albuterol  3 mL Nebulization Q4H   levalbuterol  1.25 mg Nebulization Q6H   methylPREDNISolone (SOLU-MEDROL) injection  60 mg Intravenous BID   multivitamin with minerals  1 tablet Oral Daily   polyethylene glycol  17 g Oral BID   pravastatin  40 mg Oral q1800   senna-docusate  1 tablet Oral BID   sodium chloride HYPERTONIC  4 mL Nebulization Daily   Continuous Infusions:  azithromycin 500 mg (11/10/21 2141)     LOS: 6 days    Time spent: 40 minutes    Irine Seal, MD [facility]   To contact the attending provider between 7A-7P or the covering provider during after hours 7P-7A, please log into the web site [email or web address] and access using universal  password for that web site. If you do not have the password, please call the hospital operator.  11/11/2021, 10:58 AM

## 2021-11-11 NOTE — Plan of Care (Signed)
  Problem: Education: Goal: Knowledge of General Education information will improve Description: Including pain rating scale, medication(s)/side effects and non-pharmacologic comfort measures Outcome: Progressing   Problem: Health Behavior/Discharge Planning: Goal: Ability to manage health-related needs will improve Outcome: Progressing   Problem: Clinical Measurements: Goal: Ability to maintain clinical measurements within normal limits will improve Outcome: Progressing Goal: Will remain free from infection Outcome: Progressing Goal: Diagnostic test results will improve Outcome: Progressing Goal: Respiratory complications will improve Outcome: Progressing Goal: Cardiovascular complication will be avoided Outcome: Progressing   Problem: Activity: Goal: Risk for activity intolerance will decrease Outcome: Progressing   Problem: Nutrition: Goal: Adequate nutrition will be maintained Outcome: Progressing   Problem: Coping: Goal: Level of anxiety will decrease Outcome: Progressing   Problem: Elimination: Goal: Will not experience complications related to bowel motility Outcome: Progressing Goal: Will not experience complications related to urinary retention Outcome: Progressing   Problem: Pain Managment: Goal: General experience of comfort will improve Outcome: Progressing   Problem: Safety: Goal: Ability to remain free from injury will improve Outcome: Progressing   Problem: Skin Integrity: Goal: Risk for impaired skin integrity will decrease Outcome: Progressing   Problem: Education: Goal: Knowledge of disease or condition will improve Outcome: Progressing Goal: Knowledge of the prescribed therapeutic regimen will improve Outcome: Progressing Goal: Individualized Educational Video(s) Outcome: Progressing   Problem: Activity: Goal: Ability to tolerate increased activity will improve Outcome: Progressing Goal: Will verbalize the importance of balancing  activity with adequate rest periods Outcome: Progressing   Problem: Respiratory: Goal: Ability to maintain a clear airway will improve Outcome: Progressing Goal: Levels of oxygenation will improve Outcome: Progressing Goal: Ability to maintain adequate ventilation will improve Outcome: Progressing   Problem: Education: Goal: Knowledge of disease or condition will improve Outcome: Progressing Goal: Understanding of medication regimen will improve Outcome: Progressing Goal: Individualized Educational Video(s) Outcome: Progressing   Problem: Activity: Goal: Ability to tolerate increased activity will improve Outcome: Progressing   Problem: Cardiac: Goal: Ability to achieve and maintain adequate cardiopulmonary perfusion will improve Outcome: Progressing   Problem: Health Behavior/Discharge Planning: Goal: Ability to safely manage health-related needs after discharge will improve Outcome: Progressing

## 2021-11-11 NOTE — Progress Notes (Signed)
Several hours of afib yesterday.  Now back in sinus   No new recommendations  Thompson Grayer MD, [place] 11/11/2021 2:20 PM

## 2021-11-12 DIAGNOSIS — J441 Chronic obstructive pulmonary disease with (acute) exacerbation: Secondary | ICD-10-CM | POA: Diagnosis not present

## 2021-11-12 DIAGNOSIS — I480 Paroxysmal atrial fibrillation: Secondary | ICD-10-CM | POA: Diagnosis not present

## 2021-11-12 LAB — BLOOD GAS, ARTERIAL
Acid-Base Excess: 17.9 mmol/L — ABNORMAL HIGH (ref 0.0–2.0)
Bicarbonate: 46.2 mmol/L — ABNORMAL HIGH (ref 20.0–28.0)
FIO2: 0.37
O2 Saturation: 88.3 %
Patient temperature: 37
pCO2 arterial: 68 mmHg (ref 32.0–48.0)
pH, Arterial: 7.44 (ref 7.350–7.450)
pO2, Arterial: 53 mmHg — ABNORMAL LOW (ref 83.0–108.0)

## 2021-11-12 LAB — CBC WITH DIFFERENTIAL/PLATELET
Abs Immature Granulocytes: 0.14 10*3/uL — ABNORMAL HIGH (ref 0.00–0.07)
Basophils Absolute: 0 10*3/uL (ref 0.0–0.1)
Basophils Relative: 0 %
Eosinophils Absolute: 0 10*3/uL (ref 0.0–0.5)
Eosinophils Relative: 0 %
HCT: 43.5 % (ref 36.0–46.0)
Hemoglobin: 13.7 g/dL (ref 12.0–15.0)
Immature Granulocytes: 1 %
Lymphocytes Relative: 3 %
Lymphs Abs: 0.5 10*3/uL — ABNORMAL LOW (ref 0.7–4.0)
MCH: 31.8 pg (ref 26.0–34.0)
MCHC: 31.5 g/dL (ref 30.0–36.0)
MCV: 100.9 fL — ABNORMAL HIGH (ref 80.0–100.0)
Monocytes Absolute: 0.4 10*3/uL (ref 0.1–1.0)
Monocytes Relative: 3 %
Neutro Abs: 14.8 10*3/uL — ABNORMAL HIGH (ref 1.7–7.7)
Neutrophils Relative %: 93 %
Platelets: 215 10*3/uL (ref 150–400)
RBC: 4.31 MIL/uL (ref 3.87–5.11)
RDW: 12.8 % (ref 11.5–15.5)
WBC: 15.9 10*3/uL — ABNORMAL HIGH (ref 4.0–10.5)
nRBC: 0 % (ref 0.0–0.2)

## 2021-11-12 LAB — BASIC METABOLIC PANEL
Anion gap: 7 (ref 5–15)
BUN: 19 mg/dL (ref 8–23)
CO2: 40 mmol/L — ABNORMAL HIGH (ref 22–32)
Calcium: 8.6 mg/dL — ABNORMAL LOW (ref 8.9–10.3)
Chloride: 91 mmol/L — ABNORMAL LOW (ref 98–111)
Creatinine, Ser: 0.33 mg/dL — ABNORMAL LOW (ref 0.44–1.00)
GFR, Estimated: >60 mL/min (ref >60)
Glucose, Bld: 143 mg/dL — ABNORMAL HIGH (ref 70–99)
Potassium: 4.4 mmol/L (ref 3.5–5.1)
Sodium: 138 mmol/L (ref 135–145)

## 2021-11-12 LAB — MAGNESIUM: Magnesium: 2.5 mg/dL — ABNORMAL HIGH (ref 1.7–2.4)

## 2021-11-12 LAB — TSH: TSH: 1.188 u[IU]/mL (ref 0.350–4.500)

## 2021-11-12 LAB — PHOSPHORUS: Phosphorus: 4.7 mg/dL — ABNORMAL HIGH (ref 2.5–4.6)

## 2021-11-12 MED ORDER — SENNOSIDES-DOCUSATE SODIUM 8.6-50 MG PO TABS: 1.0000 | ORAL_TABLET | Freq: Every evening | ORAL | Status: DC | PRN

## 2021-11-12 MED ORDER — DILTIAZEM HCL ER COATED BEADS 180 MG PO CP24
360.0000 mg | ORAL_CAPSULE | Freq: Every evening | ORAL | Status: DC
  Administered 2021-11-12: 360 mg via ORAL
  Filled 2021-11-12 (×2): qty 2

## 2021-11-12 MED ORDER — POLYETHYLENE GLYCOL 3350 17 G PO PACK
17.0000 g | PACK | Freq: Every day | ORAL | Status: DC | PRN
  Filled 2021-11-12 (×3): qty 1

## 2021-11-12 MED ORDER — ALPRAZOLAM 0.25 MG PO TABS
0.2500 mg | ORAL_TABLET | Freq: Two times a day (BID) | ORAL | Status: DC | PRN
  Administered 2021-11-12 – 2021-11-16 (×4): 0.25 mg via ORAL
  Filled 2021-11-12 (×4): qty 1

## 2021-11-12 MED ORDER — METHYLPREDNISOLONE SODIUM SUCC 40 MG IJ SOLR
40.0000 mg | Freq: Two times a day (BID) | INTRAMUSCULAR | Status: DC
  Administered 2021-11-12 – 2021-11-13 (×2): 40 mg via INTRAVENOUS
  Filled 2021-11-12 (×2): qty 1

## 2021-11-12 MED ORDER — NAPROXEN 375 MG PO TABS
375.0000 mg | ORAL_TABLET | Freq: Two times a day (BID) | ORAL | Status: DC | PRN
  Administered 2021-11-12 – 2021-11-15 (×2): 375 mg via ORAL
  Filled 2021-11-12 (×4): qty 1

## 2021-11-12 NOTE — Progress Notes (Signed)
Progress Note  Patient Name: Margaret Stevenson Date of Encounter: 11/12/2021  Primary Cardiologist: New to [facility] - consult by Agbor-Etang  Subjective   She redeveloped Afib with RVR at 08:36 this morning with ventricular rates into the 140s bpm. She remains in Afib with ventricular rates in the low 100s bpm currently.  No chest pain or palpitations. Dyspnea unchanged. She remains on HFNC.   Inpatient Medications    Scheduled Meds:  acetylcysteine  4 mL Nebulization TID   apixaban  5 mg Oral BID   budesonide (PULMICORT) nebulizer solution  0.5 mg Nebulization BID   diltiazem  240 mg Oral QPM   fluticasone  2 spray Each Nare Margaret   guaiFENesin  1,200 mg Oral BID   ipratropium  0.5 mg Nebulization Q6H   levalbuterol  1.25 mg Nebulization Q6H   methylPREDNISolone (SOLU-MEDROL) injection  60 mg Intravenous BID   multivitamin with minerals  1 tablet Oral Margaret   polyethylene glycol  17 g Oral BID   pravastatin  40 mg Oral q1800   senna-docusate  1 tablet Oral BID   Continuous Infusions:  PRN Meds: bisacodyl, bisacodyl, levalbuterol, naproxen, nicotine, sodium chloride, sodium chloride flush   Vital Signs    Vitals:   11/12/21 0053 11/12/21 0409 11/12/21 0733 11/12/21 1119  BP: (!) 131/55 126/60 127/69 (!) 133/91  Pulse: 74 75 76 72  Resp: 15 15 20 20   Temp: 98.7 F (37.1 C) 98.4 F (36.9 C) (!) 97.5 F (36.4 C) 98.4 F (36.9 C)  TempSrc:  Oral    SpO2: 95% 96% 97% 90%    Intake/Output Summary (Last 24 hours) at 11/12/2021 1310 Last data filed at 11/12/2021 1042 Gross per 24 hour  Intake 240 ml  Output 1000 ml  Net -760 ml   There were no vitals filed for this visit.  Telemetry    Redeveloped Afib with RVR at 08:36 this morning with ventricular rates in the 140s bpm, she remains in Afib with ventricular rates in the low 100s bpm currently - Personally Reviewed  ECG    No new tracings - Personally Reviewed  Physical Exam   GEN: Frail and chronically ill  appearing; No acute distress.   Neck: No JVD. Cardiac: Mildly tachycardic, IRIR, no murmurs, rubs, or gallops.  Respiratory: Coarse and diminished breath sounds bilaterally. Remains on HFNC. GI: Soft, nontender, non-distended.   MS: No edema; No deformity. Neuro:  Alert and oriented x 3; Nonfocal.  Psych: Normal affect.  Labs    Chemistry Recent Labs  Lab 11/10/21 0519 11/11/21 0551 11/12/21 0409  NA 136 138 138  K 5.3* 4.8 4.4  CL 90* 90* 91*  CO2 39* 43* 40*  GLUCOSE 140* 149* 143*  BUN 13 16 19   CREATININE 0.32* 0.34* 0.33*  CALCIUM 8.6* 9.1 8.6*  GFRNONAA >60 >60 >60  ANIONGAP 7 5 7      Hematology Recent Labs  Lab 11/08/21 0441 11/09/21 0540 11/12/21 0409  WBC 11.4* 10.2 15.9*  RBC 4.39 4.59 4.31  HGB 13.7 14.2 13.7  HCT 44.7 47.3* 43.5  MCV 101.8* 103.1* 100.9*  MCH 31.2 30.9 31.8  MCHC 30.6 30.0 31.5  RDW 12.9 13.1 12.8  PLT 191 194 215    Cardiac EnzymesNo results for input(s): TROPONINI in the last 168 hours. No results for input(s): TROPIPOC in the last 168 hours.   BNP Recent Labs  Lab 11/05/21 2113 11/11/21 1032  BNP 139.4* 96.4     DDimer No  results for input(s): DDIMER in the last 168 hours.   Radiology    DG Chest Port 1 View  Result Date: 11/11/2021 IMPRESSION: 1. 7 mm right upper lobe pulmonary nodule, better demonstrated on recent chest CT. 2. Peribronchial opacities with lower lobe predominance may represent bronchitic changes or interstitial pulmonary edema. 3. Emphysema. Electronically Signed   By: Fidela Salisbury M.D.   On: 11/11/2021 12:19    Cardiac Studies   2D echo 11/07/2021: 1. Left ventricular ejection fraction, by estimation, is 55 to 60%. Left  ventricular ejection fraction by PLAX is 56 %. The left ventricle has  normal function. The left ventricle has no regional wall motion  abnormalities. Left ventricular diastolic  parameters are indeterminate.   2. Right ventricular systolic function is normal. The right  ventricular  size is normal.   3. The mitral valve is normal in structure. No evidence of mitral valve  regurgitation.   4. The aortic valve was not well visualized. Aortic valve regurgitation  is not visualized.   5. The inferior vena cava is normal in size with greater than 50%  respiratory variability, suggesting right atrial pressure of 3 mmHg.  Patient Profile     75 y.o. female with history of COPD with tobacco use who was admitted on 11/28 with acute on chronic hypoxic respiratory failure secondary to COPD exacerbation with noted PAF.  Assessment & Plan    1. PAF: -She redeveloped Afib with RVR this morning at 08:36 with ventricular rates in the 140s bpm and remains in Afib in the low 100s bpm currently  -Defer addition of beta blocker at this time with COPD exacerbation  -She has not yet received Cardizem CD 240 mg today, will titrate this to 360 mg Margaret (BP ok), and given dose now -CHADS2VASc at least 3 (age x 1, vascular disease, sex category) -Eliquis -Echo with preserved LVSF as above -Last TSH in 03/2020 normal, update   2. Acute on chronic hypoxic respiratory failure with COPD exacerbation: -CO2 remains elevated  -BNP normal -Management per IM  3. Coronary artery calcification: -Noted on CT imaging  -No chest pain -High sensitivity troponin negative x 4 this admission  -EF normal on echo  4. HLD: -LDL 88 in 08/2021  -On lovastatin at home, pravastatin during admission  -Consider escalation to high intensity statin in the outpatient setting     For questions or updates, please contact [facility] Please consult [email or web address] for contact info under Cardiology/STEMI.    Signed, Christell Faith, PA-C [facility] Pager: [phone] 11/12/2021, 1:10 PM

## 2021-11-12 NOTE — Progress Notes (Signed)
OT Cancellation Note  Patient Details Name: Margaret Stevenson MRN: [id number] DOB: 22-Mar-1946   Cancelled Treatment:    Reason Eval/Treat Not Completed: Patient not medically ready. Per chart review, pt noted to have Afib with RVR this morning with ventricular rates into the 140s bpm and remains in Afib with ventricular rates in the low 100s bpm. Cardiology and Hospitalist requesting therapy to hold this date. OT to re-attempt at later time/date as pt is medically appropriate.   Fredirick Maudlin, OTR/L [place]

## 2021-11-12 NOTE — Progress Notes (Signed)
Nutrition Follow-up  DOCUMENTATION CODES:   Non-severe (moderate) malnutrition in context of chronic illness  INTERVENTION:   -Continue Ensure Enlive po BID, each supplement provides 350 kcal and 20 grams of protein (pt desires to bring in home supply) -Continue MVI with minerals daily -Continue Magic cup TID with meals, each supplement provides 290 kcal and 9 grams of protein   NUTRITION DIAGNOSIS:   Moderate Malnutrition related to chronic illness (COPD) as evidenced by mild fat depletion, moderate fat depletion, mild muscle depletion, moderate muscle depletion.  Ongoing  GOAL:   Patient will meet greater than or equal to 90% of their needs  Progressing   MONITOR:   PO intake, Supplement acceptance, Labs, Weight trends, Skin, I & O's  REASON FOR ASSESSMENT:   Consult Assessment of nutrition requirement/status  ASSESSMENT:   Margaret Stevenson is a 75 y.o. female with medical history significant for Nicotine dependence and COPD on home oxygen at 3 L who presents to the ED for the second time in 2 days with COPD exacerbation unrelieved with home bronchodilators.  EMS recorded O2 sats in the 80s on her home flow rate and she was placed on CPAP.  History is limited due to her clinical condition.  Son is at the bedside.  She has had no chest pain, fever or chills no any nausea, vomiting, abdominal pain or diarrhea  Reviewed I/O's: -550 ml x 24 hours and +1.7 L since admission  UOP: 500 ml x 24 hours   Pt on phone at time of visit; noted pt ordering lunch meal.   Intake has improved since last visit. Noted meal completions 25-100%.   Pt refused to be given Ensure in the hospital and prefers son to bring in her home supply.   Medications reviewed and include cardizem, solu-medrol, miralax, and senokot.   Labs reviewed: Phos: 4.7. Mg: 2.5.   Diet Order:   Diet Order             Diet regular Room service appropriate? Yes; Fluid consistency: Thin  Diet effective now                    EDUCATION NEEDS:   Education needs have been addressed  Skin:  Skin Assessment: Reviewed RN Assessment  Last BM:  11/11/21  Height:   Ht Readings from Last 1 Encounters:  11/03/21 5' 6.5" (1.689 m)    Weight:   Wt Readings from Last 1 Encounters:  11/03/21 59 kg    Ideal Body Weight:  60.2 kg  BMI:  There is no height or weight on file to calculate BMI.  Estimated Nutritional Needs:   Kcal:  2050-2250  Protein:  105-120 grams  Fluid:  > 2 L    Loistine Chance, RD, LDN, [place] Registered Dietitian II Certified Diabetes Care and Education Specialist Please refer to [facility] for RD and/or RD on-call/weekend/after hours pager

## 2021-11-12 NOTE — Progress Notes (Signed)
PROGRESS NOTE    Margaret Stevenson   ZOX:[id number]  DOB: [birth year]  PCP: Dion Body, MD    DOA: 11/05/2021 LOS: 7    Brief Narrative / Hospital Course to Date:   75 year old female with past medical history of COPD, previous melanoma and pulmonary nodules who was admitted on 11/05/2021 with difficulty breathing.  CT scan of the chest on 11/03/2021 after an ER visit that was negative for pulmonary embolism and showed stable pulmonary nodules.   Admitted with COPD exacerbation with acute hypoxic respiratory failure initially requiring BiPAP.  Started on steroids.  Patient also developed episodes of SVT.  Cardiology consulted.  Assessment & Plan   Principal Problem:   COPD with acute exacerbation ([place]) Active Problems:   Tobacco dependence   Acute on chronic respiratory failure with hypoxia (HCC)   Paroxysmal atrial fibrillation (HCC)   Malnutrition of moderate degree   Hypokalemia   Acute on chronic hypoxic respiratory failure - POA with shortness of breath, EMS recorded pulse ox in the 80s on her home oxygen.  Required BiPAP on arrival.   CTA chest 11/26 negative for PE. 11/30: Weaned to 5 L/min O2. 12/1-2: Increased oxygen requirement 8 to 9 L/min HFNC O2.  Attempted to wean to 6 L but desatted to upper 80s at rest. 12/3: sats maintained low 90's on 8-9 L/min O2 12/5: now on heated HFNC at 45 L/min.  ABG: 7.44/68/53/46.2  Paroxysmal A-fib - HR in the ED after admission was as high as 180s.  Initially thought to be SVT, no improvement with Valsalva or carotid massage.  Adenosine was considered, but rhythm improved prior to this being pushed.   Later given IV Cardizem push with improvement. Echo on 11/07/2021: EF 55 to 60%, indeterminate diastolic parameters, no WMA's --Cardiology consulted, see their recs --Started on Eliquis (CHA2DS2-VASc score 2 (age, gender)) --Rate controlled on PO Cardizem 60 mg q6h --Changed to Cardizem CD 240 mg p.o. daily --Optimize  respiratory status   COPD exacerbation - POA.  Severe advanced COPD.  Follows with Dr. Raul Del.   Has wheezing and poor air movement, on more O2 than yesterday.  --Pulmonology following, see their recs --Continue Zithromax  --Solu-Medrol 60>>40 mg IV BID --Continue Xopenex given tachycardia --Continue Atrovent and Dulera  --Mucomyst added --Chest PT w vest --Pulmonary hygiene  Hypokalemia - replaced 12/1 for K 3.2.  Monitor and replace as needed.  Tobacco abuse - Nicotine patch PRN  Pulmonary nodules - PET scan April consistent with benign etiology.  But recommended following up with CT scans to monitor.  Constipation - bowel regimen ordered, including enema and/or suppository if needed.     DVT prophylaxis:  apixaban (ELIQUIS) tablet 5 mg   Diet:  Diet Orders (From admission, onward)     Start     Ordered   11/05/21 2313  Diet regular Room service appropriate? Yes; Fluid consistency: Thin  Diet effective now       Question Answer Comment  Room service appropriate? Yes   Fluid consistency: Thin      11/05/21 2314              Code Status: DNR   Subjective 11/12/21    Pt seen with son at bedside this morning.  She is now on heated HFNC oxygen and requests it be turned up bc her home pulse oximeter says 90-92%.  She is not reassured when told 88-94% is the goal.  Says the "10 lb block on concrete" in her chest  feels like its down to "3 lbs" after being put on heated HF and having chest PT.  Has a headache this AM and frustrated by not getting medicine for it yet.  She does have her eyes open today, first day I've seen her open her eyes during my encounters.   Disposition Plan & Communication   Status is: Inpatient  Remains inpatient appropriate because: on IV therapies and requiring increased o2 supplementation   Family communication - son at bedside on rounds today 12/5  Consults, Procedures, Significant Events   Consultants:  None  Procedures:   none  Antimicrobials:  Anti-infectives (From admission, onward)    Start     Dose/Rate Route Frequency Ordered Stop   11/07/21 2000  azithromycin (ZITHROMAX) 500 mg in sodium chloride 0.9 % 250 mL IVPB        500 mg 250 mL/hr over 60 Minutes Intravenous Every 24 hours 11/07/21 1926 11/11/21 2105         Micro    Objective   Vitals:   11/12/21 0733 11/12/21 1119 11/12/21 1344 11/12/21 1523  BP: 127/69 (!) 133/91  (!) 139/56  Pulse: 76 72  83  Resp: 20 20  20   Temp: (!) 97.5 F (36.4 C) 98.4 F (36.9 C)  98 F (36.7 C)  TempSrc:      SpO2: 97% 90% 90% 90%    Intake/Output Summary (Last 24 hours) at 11/12/2021 1905 Last data filed at 11/12/2021 1042 Gross per 24 hour  Intake 240 ml  Output 1000 ml  Net -760 ml   There were no vitals filed for this visit.  Physical Exam:  General exam: awake, alert, no acute distress, frail, underweight, chronically ill-appearing Respiratory system: On heated HFNC at 45 L/min, No wheezing, improved aeration, normal respiratory effort at rest, coarse sounding cough intermittently. Cardiovascular system: irregularly irregular, no peripheral edema.   Central nervous system: A&O x3.  Normal speech.  Grossly nonfocal exam Psychiatry: irritable mood, congruent affect, judgement and insight appear normal  Labs   Data Reviewed: I have personally reviewed following labs and imaging studies  CBC: Recent Labs  Lab 11/05/21 2057 11/08/21 0441 11/09/21 0540 11/12/21 0409  WBC 10.5 11.4* 10.2 15.9*  NEUTROABS  --   --   --  14.8*  HGB 15.2* 13.7 14.2 13.7  HCT 50.6* 44.7 47.3* 43.5  MCV 106.3* 101.8* 103.1* 100.9*  PLT 205 191 194 384   Basic Metabolic Panel: Recent Labs  Lab 11/08/21 0441 11/09/21 0540 11/10/21 0519 11/11/21 0551 11/12/21 0409  NA 139 139 136 138 138  K 3.2* 4.7 5.3* 4.8 4.4  CL 91* 93* 90* 90* 91*  CO2 42* 43* 39* 43* 40*  GLUCOSE 143* 103* 140* 149* 143*  BUN 17 12 13 16 19   CREATININE 0.41* 0.30*  0.32* 0.34* 0.33*  CALCIUM 8.6* 8.9 8.6* 9.1 8.6*  MG 2.3 2.3  --   --  2.5*  PHOS  --   --   --   --  4.7*   GFR: Estimated Creatinine Clearance: 57.5 mL/min (A) (by C-G formula based on SCr of 0.33 mg/dL (L)). Liver Function Tests: No results for input(s): AST, ALT, ALKPHOS, BILITOT, PROT, ALBUMIN in the last 168 hours. No results for input(s): LIPASE, AMYLASE in the last 168 hours. No results for input(s): AMMONIA in the last 168 hours. Coagulation Profile: No results for input(s): INR, PROTIME in the last 168 hours. Cardiac Enzymes: No results for input(s): CKTOTAL, CKMB, CKMBINDEX, TROPONINI in  the last 168 hours. BNP (last 3 results) No results for input(s): PROBNP in the last 8760 hours. HbA1C: No results for input(s): HGBA1C in the last 72 hours. CBG: No results for input(s): GLUCAP in the last 168 hours. Lipid Profile: No results for input(s): CHOL, HDL, LDLCALC, TRIG, CHOLHDL, LDLDIRECT in the last 72 hours. Thyroid Function Tests: Recent Labs    11/12/21 0409  TSH 1.188   Anemia Panel: No results for input(s): VITAMINB12, FOLATE, FERRITIN, TIBC, IRON, RETICCTPCT in the last 72 hours. Sepsis Labs: Recent Labs  Lab 11/10/21 0519 11/11/21 0551  PROCALCITON <0.10 <0.10    Recent Results (from the past 240 hour(s))  Resp Panel by RT-PCR (Flu A&B, Covid) Nasopharyngeal Swab     Status: None   Collection Time: 11/03/21  5:36 AM   Specimen: Nasopharyngeal Swab; Nasopharyngeal(NP) swabs in vial transport medium  Result Value Ref Range Status   SARS Coronavirus 2 by RT PCR NEGATIVE NEGATIVE Final    Comment: (NOTE) SARS-CoV-2 target nucleic acids are NOT DETECTED.  The SARS-CoV-2 RNA is generally detectable in upper respiratory specimens during the acute phase of infection. The lowest concentration of SARS-CoV-2 viral copies this assay can detect is 138 copies/mL. A negative result does not preclude SARS-Cov-2 infection and should not be used as the sole basis for  treatment or other patient management decisions. A negative result may occur with  improper specimen collection/handling, submission of specimen other than nasopharyngeal swab, presence of viral mutation(s) within the areas targeted by this assay, and inadequate number of viral copies(<138 copies/mL). A negative result must be combined with clinical observations, patient history, and epidemiological information. The expected result is Negative.  Fact Sheet for Patients:  EntrepreneurPulse.com.au  Fact Sheet for Healthcare Providers:  IncredibleEmployment.be  This test is no t yet approved or cleared by the [place] FDA and  has been authorized for detection and/or diagnosis of SARS-CoV-2 by FDA under an Emergency Use Authorization (EUA). This EUA will remain  in effect (meaning this test can be used) for the duration of the COVID-19 declaration under Section 564(b)(1) of the Act, 21 U.S.C.section 360bbb-3(b)(1), unless the authorization is terminated  or revoked sooner.       Influenza A by PCR NEGATIVE NEGATIVE Final   Influenza B by PCR NEGATIVE NEGATIVE Final    Comment: (NOTE) The Xpert Xpress SARS-CoV-2/FLU/RSV plus assay is intended as an aid in the diagnosis of influenza from Nasopharyngeal swab specimens and should not be used as a sole basis for treatment. Nasal washings and aspirates are unacceptable for Xpert Xpress SARS-CoV-2/FLU/RSV testing.  Fact Sheet for Patients: EntrepreneurPulse.com.au  Fact Sheet for Healthcare Providers: IncredibleEmployment.be  This test is not yet approved or cleared by the [place] FDA and has been authorized for detection and/or diagnosis of SARS-CoV-2 by FDA under an Emergency Use Authorization (EUA). This EUA will remain in effect (meaning this test can be used) for the duration of the COVID-19 declaration under Section 564(b)(1) of the Act, 21  U.S.C. section 360bbb-3(b)(1), unless the authorization is terminated or revoked.  Performed at [facility], [address] [place], [place] [address]   Resp Panel by RT-PCR (Flu A&B, Covid) Nasopharyngeal Swab     Status: None   Collection Time: 11/05/21 10:02 PM   Specimen: Nasopharyngeal Swab; Nasopharyngeal(NP) swabs in vial transport medium  Result Value Ref Range Status   SARS Coronavirus 2 by RT PCR NEGATIVE NEGATIVE Final    Comment: (NOTE) SARS-CoV-2 target nucleic acids are  NOT DETECTED.  The SARS-CoV-2 RNA is generally detectable in upper respiratory specimens during the acute phase of infection. The lowest concentration of SARS-CoV-2 viral copies this assay can detect is 138 copies/mL. A negative result does not preclude SARS-Cov-2 infection and should not be used as the sole basis for treatment or other patient management decisions. A negative result may occur with  improper specimen collection/handling, submission of specimen other than nasopharyngeal swab, presence of viral mutation(s) within the areas targeted by this assay, and inadequate number of viral copies(<138 copies/mL). A negative result must be combined with clinical observations, patient history, and epidemiological information. The expected result is Negative.  Fact Sheet for Patients:  EntrepreneurPulse.com.au  Fact Sheet for Healthcare Providers:  IncredibleEmployment.be  This test is no t yet approved or cleared by the [place] FDA and  has been authorized for detection and/or diagnosis of SARS-CoV-2 by FDA under an Emergency Use Authorization (EUA). This EUA will remain  in effect (meaning this test can be used) for the duration of the COVID-19 declaration under Section 564(b)(1) of the Act, 21 U.S.C.section 360bbb-3(b)(1), unless the authorization is terminated  or revoked sooner.       Influenza A by PCR NEGATIVE NEGATIVE Final    Influenza B by PCR NEGATIVE NEGATIVE Final    Comment: (NOTE) The Xpert Xpress SARS-CoV-2/FLU/RSV plus assay is intended as an aid in the diagnosis of influenza from Nasopharyngeal swab specimens and should not be used as a sole basis for treatment. Nasal washings and aspirates are unacceptable for Xpert Xpress SARS-CoV-2/FLU/RSV testing.  Fact Sheet for Patients: EntrepreneurPulse.com.au  Fact Sheet for Healthcare Providers: IncredibleEmployment.be  This test is not yet approved or cleared by the [place] FDA and has been authorized for detection and/or diagnosis of SARS-CoV-2 by FDA under an Emergency Use Authorization (EUA). This EUA will remain in effect (meaning this test can be used) for the duration of the COVID-19 declaration under Section 564(b)(1) of the Act, 21 U.S.C. section 360bbb-3(b)(1), unless the authorization is terminated or revoked.  Performed at [facility], [address] [place], [place] [address]       Imaging Studies   DG Chest Cope 1 View  Result Date: 11/11/2021 CLINICAL DATA:  COPD exacerbation. EXAM: PORTABLE CHEST 1 VIEW COMPARISON:  November 05, 2021 FINDINGS: Calcific atherosclerotic disease and tortuosity of the aorta. Cardiomediastinal silhouette is normal. Mediastinal contours appear intact. Hyperinflation of the lungs and upper lobe predominant emphysematous changes. 7 mm right upper lobe pulmonary nodule, better demonstrated on recent chest CT. Peribronchial opacities with lower lobe predominance. Osseous structures are without acute abnormality. Soft tissues are grossly normal. IMPRESSION: 1. 7 mm right upper lobe pulmonary nodule, better demonstrated on recent chest CT. 2. Peribronchial opacities with lower lobe predominance may represent bronchitic changes or interstitial pulmonary edema. 3. Emphysema. Electronically Signed   By: Fidela Salisbury M.D.   On: 11/11/2021 12:19      Medications   Scheduled Meds:  acetylcysteine  4 mL Nebulization TID   apixaban  5 mg Oral BID   budesonide (PULMICORT) nebulizer solution  0.5 mg Nebulization BID   diltiazem  360 mg Oral QPM   fluticasone  2 spray Each Nare Daily   guaiFENesin  1,200 mg Oral BID   ipratropium  0.5 mg Nebulization Q6H   levalbuterol  1.25 mg Nebulization Q6H   methylPREDNISolone (SOLU-MEDROL) injection  40 mg Intravenous BID   multivitamin with minerals  1 tablet Oral Daily   polyethylene glycol  17 g Oral BID   pravastatin  40 mg Oral q1800   senna-docusate  1 tablet Oral BID   Continuous Infusions:       LOS: 7 days    Time spent: 30 minutes with > 50% spent at bedside and in coordination of care     Ezekiel Slocumb, DO [facility]  11/12/2021, 7:05 PM      If 7PM-7AM, please contact night-coverage. How to contact the [facility] Attending or Consulting provider [place] or covering provider during after hours [place], for this patient?    Check the care team in [facility] and look for a) attending/consulting [facility] provider listed and b) the [facility] team listed Log into [email or web address] and use [place]'s universal password to access. If you do not have the password, please contact the hospital operator. Locate the [facility] provider you are looking for under [facility] and page to a number that you can be directly reached. If you still have difficulty reaching the provider, please page the [facility] (Director on Call) for the Hospitalists listed on amion for assistance.

## 2021-11-12 NOTE — Progress Notes (Signed)
PT Cancellation Note  Patient Details Name: Margaret Stevenson MRN: [id number] DOB: [birth year]   Cancelled Treatment:    Reason Eval/Treat Not Completed: Medical issues which prohibited therapy  Reason Eval/Treat Not Completed: Patient not medically ready. Per chart review, pt noted to have Afib with RVR this morning with ventricular rates into the 140s bpm and remains in Afib with ventricular rates in the low 100s bpm. Cardiology and Hospitalist requesting therapy to hold this date. PT o re-attempt at later time/date as pt is medically appropriate.     Chesley Noon 11/12/2021, 2:23 PM

## 2021-11-12 NOTE — Progress Notes (Signed)
Pt spontaneously became antagonized without provocation, displaying mood swings and making unrealistic demands, then calms down once again.

## 2021-11-12 NOTE — Consult Note (Signed)
NAME:  Margaret Stevenson, MRN:  [id number], DOB:  Apr 04, 1946, LOS: 7 ADMISSION DATE:  11/05/2021, CONSULTATION DATE: 11/11/2021 REFERRING MD: Irine Seal, MD, CHIEF COMPLAINT: Persistent hypoxia  Brief Patient Report  75 y.o. female w/ a h/o COPD and tob abuse, who was admitted 11/28 w/ resp failure and COPD w/ finding of PAF. HPI  75 year old female with past medical history of COPD, previous melanoma and pulmonary nodules who was admitted on 11/05/2021 with difficulty breathing.  CT scan of the chest on 11/03/2021 after an ER visit that was negative for pulmonary embolism and showed stable pulmonary nodules. She presented back to the ED with worsening respiratory symptoms.  ED Course: On arrival to the ED, she was afebrile with blood pressure 133/52 mm Hg and pulse rate  100 beats/min, RR 21, Sats 98% on 100% BiPAP. Pertinent Labs in Red/Diagnostics Findings: WBC 10.5, hemoglobin 15.2, troponin 8, bicarb 44, glucose 191, otherwise unremarkable EKG: sinus tachycardia. CXR: no acute disease.  Emphysema Patient treated with duo nebs and Solu-Medrol but continued to have increased work of breathing. Patient admitted to progressive care unit.  Hospital Course: During the course of admission patient developed episode of SVT with rates as high as 180s.  Valsalva maneuver and carotid massage performed with improvement.  Adenosine was considered but rhythm improved prior to being administered.  Patient later given IV Cardizem push with improvement.  Cardiology consulted patient started on Eliquis and p.o. Cardizem 30 mg every 6 hours with plans to switch to CD before DC.  Patient continued to require increased oxygenation from 89 L HFNC O2.  Weaning attempts was made to 6 L but patient desatted to low 80s at rest.  This morning patient continues to complain of shortness of breath states she is satting in the 90s but feels like she is satting in the 80s.  Due to increased oxygen requirement with  persistent hypoxia PCCM consulted to assist with management.  11/12/21- family including son is at bedside, patient on HFNC 54/50.  Patient is slow to answer but lucid.   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   Elevated CA 19-9 level 05/08/2017   Arthritis 05/07/2017   Asthma without status asthmaticus 05/07/2017   Borderline diabetes 05/07/2017   Hx of adenomatous colonic polyps 05/07/2017   Tobacco dependence 05/07/2017   Sepsis ([place]) 04/27/2017   Disease of biliary tract     Obstruction of bile duct     Jaundice     Hypoxia 10/11/2016   Anxiety 10/11/2016   COPD (chronic obstructive pulmonary disease) ([place]) 10/06/2016   CAP (community acquired pneumonia) 10/06/2016   Hyperglycemia 10/06/2016   Vaccine counseling 06/21/2016   History of malignant melanoma of skin 08/03/2013    Significant Hospital Events   11/28: Admitted to progressive care unit with acute on chronic respiratory failure secondary to COPD exacerbation/anxiety 11/30: Continue to wean oxygen to 5 L/min. Cardiology consulted for Afib with RVR 12/1: Patient with increased oxygen requirement 8 to 9 L HFNC failed weaning. 12/2: Remains on high flow  nasal cannula at 8 to 9 L to keep sats greater than 90% 12/3: Still requiring high oxygen also in A. fib with RVR 12/4:  PCCM consulted Consults:  Cardiology PCCM  Procedures:  None  Significant Diagnostic Tests:  11/28: Chest Xray> no acute cardiopulmonary process, emphysema 12/4: 7 mm right upper lobe pulmonary nodule, better demonstrated on recent chest CT. 2.  Peribronchial opacities with lower lobe predominance may represent bronchitic changes or interstitial pulmonary edema. 3. Emphysema  Micro Data:  11/28: SARS-CoV-2 PCR> negative 11/28: Influenza PCR> negative  Antimicrobials:  None  OBJECTIVE  Blood pressure (!) 139/56, pulse 83, temperature 98 F (36.7 C), resp. rate 20, SpO2 90 %.    FiO2 (%):  [37 %-56 %] 53 %   Intake/Output Summary (Last 24 hours) at 11/12/2021 1655 Last data filed at 11/12/2021 1042 Gross per 24 hour  Intake 240 ml  Output 1000 ml  Net -760 ml    There were no vitals filed for this visit.  Physical Examination  GENERAL: 75 year-old patient lying in the bed with no acute distress.  EYES: Pupils equal, round, reactive to light and accommodation. No scleral icterus. Extraocular muscles intact.  HEENT: Head atraumatic, normocephalic. Oropharynx and nasopharynx clear.  NECK:  Supple, no jugular venous distention. No thyroid enlargement, no tenderness.  LUNGS: Decreased breath sounds bilaterally, no wheezing, rales,rhonchi or crepitation. No use of accessory muscles of respiration.  CARDIOVASCULAR: S1, S2 normal. No murmurs, rubs, or gallops.  ABDOMEN: Soft, nontender, nondistended. Bowel sounds present. No organomegaly or mass.  EXTREMITIES: No pedal edema, cyanosis, or clubbing.  NEUROLOGIC: Cranial nerves II through XII are intact.  Muscle strength 5/5 in all extremities. Sensation intact. Gait not checked.  PSYCHIATRIC: The patient is alert and oriented x 3.  SKIN: No obvious rash, lesion, or ulcer.   Labs/imaging that I havepersonally reviewed  (right click and "Reselect all SmartList Selections" daily)     Labs   CBC: Recent Labs  Lab 11/05/21 2057 11/08/21 0441 11/09/21 0540 11/12/21 0409  WBC 10.5 11.4* 10.2 15.9*  NEUTROABS  --   --   --  14.8*  HGB 15.2* 13.7 14.2 13.7  HCT 50.6* 44.7 47.3* 43.5  MCV 106.3* 101.8* 103.1* 100.9*  PLT 205 191 194 215     Basic Metabolic Panel: Recent  Labs  Lab 11/08/21 0441 11/09/21 0540 11/10/21 0519 11/11/21 0551 11/12/21 0409  NA 139 139 136 138 138  K 3.2* 4.7 5.3* 4.8 4.4  CL 91* 93* 90* 90* 91*  CO2 42* 43* 39* 43* 40*  GLUCOSE 143* 103* 140* 149* 143*  BUN 17 12 13 16 19   CREATININE 0.41* 0.30* 0.32* 0.34* 0.33*  CALCIUM 8.6* 8.9 8.6* 9.1 8.6*  MG 2.3 2.3  --   --  2.5*  PHOS  --   --   --   --  4.7*    GFR: Estimated Creatinine Clearance: 57.5 mL/min (A) (by C-G formula based on SCr of 0.33 mg/dL (L)). Recent Labs  Lab 11/05/21 2057 11/08/21 0441 11/09/21 0540 11/10/21 0519 11/11/21 0551 11/12/21 0409  PROCALCITON  --   --   --  <0.10 <0.10  --   WBC 10.5 11.4* 10.2  --   --  15.9*     Liver Function Tests: No results for input(s): AST, ALT, ALKPHOS, BILITOT, PROT, ALBUMIN in the last 168 hours. No results for input(s): LIPASE, AMYLASE in the last 168 hours. No results for input(s): AMMONIA in the last 168  hours.  ABG    Component Value Date/Time   PHART 7.44 11/12/2021 0847   PCO2ART 68 (HH) 11/12/2021 0847   PO2ART 53 (L) 11/12/2021 0847   HCO3 46.2 (H) 11/12/2021 0847   O2SAT 88.3 11/12/2021 0847     Coagulation Profile: No results for input(s): INR, PROTIME in the last 168 hours.  Cardiac Enzymes: No results for input(s): CKTOTAL, CKMB, CKMBINDEX, TROPONINI in the last 168 hours.  HbA1C: Hgb A1c MFr Bld  Date/Time Value Ref Range Status  04/27/2017 12:17 AM 4.5 (L) 4.8 - 5.6 % Final    Comment:    (NOTE)         Pre-diabetes: 5.7 - 6.4         Diabetes: >6.4         Glycemic control for adults with diabetes: <7.0     CBG: No results for input(s): GLUCAP in the last 168 hours.  Review of Systems:   Review of Systems  Constitutional: Negative.   HENT: Negative.    Eyes: Negative.   Respiratory:  Positive for cough, sputum production, shortness of breath and wheezing. Negative for hemoptysis.   Cardiovascular:  Positive for palpitations, orthopnea and leg swelling. Negative for  chest pain, claudication and PND.  Gastrointestinal: Negative.   Genitourinary: Negative.   Musculoskeletal:  Positive for back pain and neck pain.  Skin: Negative.   Neurological: Negative.   Endo/Heme/Allergies: Negative.   Psychiatric/Behavioral:  The patient is nervous/anxious.     Past Medical History  She,  has a past medical history of Arthritis, Cancer ([place]), COPD (chronic obstructive pulmonary disease) ([place]), Dyspnea, and Jaundice (04/2017).   Surgical History    Past Surgical History:  Procedure Laterality Date   BREAST BIOPSY Right 08/26/2018   Affirm Bx ("x" clip) path pending   BUNIONECTOMY     COLONOSCOPY     COLONOSCOPY WITH PROPOFOL N/A 05/26/2017   Procedure: COLONOSCOPY WITH PROPOFOL;  Surgeon: Lucilla Lame, MD;  Location: [facility];  Service: Endoscopy;  Laterality: N/A;  please do not move appt time   ERCP N/A 04/25/2017   Procedure: ENDOSCOPIC RETROGRADE CHOLANGIOPANCREATOGRAPHY (ERCP);  Surgeon: Lucilla Lame, MD;  Location: [facility] ENDOSCOPY;  Service: Endoscopy;  Laterality: N/A;   ERCP N/A 09/30/2017   Procedure: ENDOSCOPIC RETROGRADE CHOLANGIOPANCREATOGRAPHY (ERCP) Pancreatic stent removal;  Surgeon: Lucilla Lame, MD;  Location: [facility] END[facility]COPY;  Service: Endoscopy;  Laterality: N/A;   POLYPECTOMY  05/26/2017   Procedure: POLYPECTOMY;  Surgeon: Lucilla Lame, MD;  Location: [place];  Service: Endoscopy;;   TONSILLECTOMY       Social History   reports that she quit smoking 9 days ago. Her smoking use included cigarettes. She has a 28.50 pack-year smoking history. She has never used smokeless tobacco. She reports current alcohol use of about 7.0 standard drinks per week. She reports that she does not use drugs.   Family History   Her family history includes Breast cancer (age of onset: 13) in her mother.   Allergies Allergies  Allergen Reactions   Penicillins Hives    Has patient had a PCN reaction causing immediate rash,  facial/tongue/throat swelling, SOB or lightheadedness with hypotension: no Has patient had a PCN reaction causing severe rash involving mucus membranes or skin necrosis: no Has patient had a PCN reaction that required hospitalization no Has patient had a PCN reaction occurring within the last 10 years: no If all of the above answers are "NO", then may proceed with Cephalosporin use.  Home Medications  Prior to Admission medications   Medication Sig Start Date End Date Taking? Authorizing Provider  albuterol (PROVENTIL) (2.5 MG/3ML) 0.083% nebulizer solution Take 3 mLs (2.5 mg total) by nebulization every 4 (four) hours as needed for wheezing or shortness of breath. 11/03/21 11/03/22  Naaman Plummer, MD  albuterol (VENTOLIN HFA) 108 (90 Base) MCG/ACT inhaler Inhale 2 puffs into the lungs every 4 (four) hours as needed for shortness of breath.  08/27/16 01/24/19  [provider]  aspirin EC 81 MG tablet Take 81 mg by mouth daily. 10/11/21   [provider]  Fluticasone-Salmeterol (ADVAIR) 250-50 MCG/DOSE AEPB Inhale 1 puff into the lungs 2 (two) times daily.    [provider]  lovastatin (MEVACOR) 40 MG tablet Take 40 mg by mouth daily with supper. 10/11/21   [provider]  OXYGEN Inhale 3 L into the lungs. 1 L when not active    [provider]  tiotropium (SPIRIVA) 18 MCG inhalation capsule Place 18 mcg into inhaler and inhale at bedtime.  08/27/16 01/24/19  [provider]  TURMERIC PO Take by mouth.    [provider]    Scheduled Meds:  acetylcysteine  4 mL Nebulization TID   apixaban  5 mg Oral BID   budesonide (PULMICORT) nebulizer solution  0.5 mg Nebulization BID   diltiazem  360 mg Oral QPM   fluticasone  2 spray Each Nare Daily   guaiFENesin  1,200 mg Oral BID   ipratropium  0.5 mg Nebulization Q6H   levalbuterol  1.25 mg Nebulization Q6H   methylPREDNISolone (SOLU-MEDROL) injection  60 mg Intravenous BID    multivitamin with minerals  1 tablet Oral Daily   polyethylene glycol  17 g Oral BID   pravastatin  40 mg Oral q1800   senna-docusate  1 tablet Oral BID   Continuous Infusions:   PRN Meds:.ALPRAZolam, bisacodyl, bisacodyl, levalbuterol, naproxen, nicotine, sodium chloride, sodium chloride flush    Assessment & Plan:  Acute on chronic hypercapnic hypoxic respiratory failure secondary to AECOPD  PMHx: Asthma, COPD on Chronic home oxygen at 3L, current everyday smoker  -Supplemental oxygen as needed, maintain SpO2 > 88% -Intermittent chest x-ray & ABG PRN -Ensure adequate pulmonary hygiene -Continue Bronchodilators as needed -ICS >> Pulmicort BID -LAMA >> Duoneb TID substitute albuterol in the setting of A. fib RVR -Continue chest physiotherapy, will add Mucomyst 3 times daily -Methylprednisolone IV 40 BID -Consider IV Azithromycin 500mg  daily x 3 days -Follow cultures  -Trend PCT, monitor WBC/ fever curve -consider NPPV BIPAP PRN -Smoking cessation education once stabilized  Paroxysmal atrial fibrillation Runs of SVT New since admission likely in the setting of AECOPD as above -Continue diltiazem 240 daily for rate control and as BP permits -Continue Eliquis 5mg  BID  [id number]  -If uncontrolled with above plan for AAD per cardio recommendations -Replace electrolytes,  Hyperglycemia likely steroid-induced -Check hemoglobin A1c -CBGs -Sliding scale insulin -Follow hyper/hypoglycemia protocol ` Best practice:  Diet:  Oral Pain/Anxiety/Delirium protocol (if indicated): No VAP protocol (if indicated): Not indicated DVT prophylaxis: Systemic AC GI prophylaxis: N/A Glucose control:  SSI Yes Central venous access:  N/A Arterial line:  N/A Foley:  N/A Mobility:  bed rest  PT consulted: Yes Last date of multidisciplinary goals of care discussion [12/4] Code Status:  DNR Disposition: Progressive Care Unit   = Goals of Care = Code Status Order: @CODE @   Primary  Emergency ContactDeniese, [place], Home Phone: [phone] Patient wishes to pursue ongoing treatment,  but concurred that if deteriorated to pulselessness, patient would prefer a natural death as opposed to invasive measures such as CPR and intubation.      Ottie Glazier, M.D.  Pulmonary & [place]

## 2021-11-13 ENCOUNTER — Encounter: Payer: Self-pay | Admitting: Internal Medicine

## 2021-11-13 DIAGNOSIS — J441 Chronic obstructive pulmonary disease with (acute) exacerbation: Secondary | ICD-10-CM | POA: Diagnosis not present

## 2021-11-13 DIAGNOSIS — Z515 Encounter for palliative care: Secondary | ICD-10-CM | POA: Diagnosis not present

## 2021-11-13 DIAGNOSIS — Z7189 Other specified counseling: Secondary | ICD-10-CM

## 2021-11-13 DIAGNOSIS — I480 Paroxysmal atrial fibrillation: Secondary | ICD-10-CM | POA: Diagnosis not present

## 2021-11-13 DIAGNOSIS — J9621 Acute and chronic respiratory failure with hypoxia: Secondary | ICD-10-CM | POA: Diagnosis not present

## 2021-11-13 LAB — CBC
HCT: 50 % — ABNORMAL HIGH (ref 36.0–46.0)
Hemoglobin: 15.2 g/dL — ABNORMAL HIGH (ref 12.0–15.0)
MCH: 30.7 pg (ref 26.0–34.0)
MCHC: 30.4 g/dL (ref 30.0–36.0)
MCV: 101 fL — ABNORMAL HIGH (ref 80.0–100.0)
Platelets: 226 10*3/uL (ref 150–400)
RBC: 4.95 MIL/uL (ref 3.87–5.11)
RDW: 13 % (ref 11.5–15.5)
WBC: 17.1 10*3/uL — ABNORMAL HIGH (ref 4.0–10.5)
nRBC: 0 % (ref 0.0–0.2)

## 2021-11-13 LAB — BASIC METABOLIC PANEL
Anion gap: 7 (ref 5–15)
BUN: 26 mg/dL — ABNORMAL HIGH (ref 8–23)
CO2: 41 mmol/L — ABNORMAL HIGH (ref 22–32)
Calcium: 9 mg/dL (ref 8.9–10.3)
Chloride: 92 mmol/L — ABNORMAL LOW (ref 98–111)
Creatinine, Ser: 0.34 mg/dL — ABNORMAL LOW (ref 0.44–1.00)
GFR, Estimated: >60 mL/min (ref >60)
Glucose, Bld: 149 mg/dL — ABNORMAL HIGH (ref 70–99)
Potassium: 4.4 mmol/L (ref 3.5–5.1)
Sodium: 140 mmol/L (ref 135–145)

## 2021-11-13 MED ORDER — DIGOXIN 0.25 MG/ML IJ SOLN
0.2500 mg | Freq: Once | INTRAMUSCULAR | Status: AC | PRN
  Administered 2021-11-13: 0.25 mg via INTRAVENOUS
  Filled 2021-11-13: qty 2

## 2021-11-13 MED ORDER — DILTIAZEM HCL 25 MG/5ML IV SOLN
INTRAVENOUS | Status: AC
  Filled 2021-11-13: qty 5

## 2021-11-13 MED ORDER — MORPHINE SULFATE (PF) 2 MG/ML IV SOLN
1.0000 mg | INTRAVENOUS | Status: DC | PRN
  Administered 2021-11-13 – 2021-11-19 (×15): 1 mg via INTRAVENOUS
  Filled 2021-11-13 (×15): qty 1

## 2021-11-13 MED ORDER — DILTIAZEM HCL-DEXTROSE 125-5 MG/125ML-% IV SOLN (PREMIX)
5.0000 mg/h | INTRAVENOUS | Status: DC
Start: 2021-11-13 — End: 2021-11-14
  Administered 2021-11-13 – 2021-11-14 (×2): 5 mg/h via INTRAVENOUS
  Filled 2021-11-13 (×2): qty 125

## 2021-11-13 MED ORDER — DILTIAZEM HCL 25 MG/5ML IV SOLN
10.0000 mg | Freq: Once | INTRAVENOUS | Status: AC
  Administered 2021-11-13: 10 mg via INTRAVENOUS

## 2021-11-13 MED ORDER — DILTIAZEM HCL 25 MG/5ML IV SOLN
5.0000 mg | Freq: Once | INTRAVENOUS | Status: AC
  Administered 2021-11-13: 5 mg via INTRAVENOUS
  Filled 2021-11-13: qty 5

## 2021-11-13 MED ORDER — METHYLPREDNISOLONE SODIUM SUCC 40 MG IJ SOLR
40.0000 mg | INTRAMUSCULAR | Status: DC
  Administered 2021-11-14 – 2021-11-17 (×4): 40 mg via INTRAVENOUS
  Filled 2021-11-13 (×4): qty 1

## 2021-11-13 NOTE — Progress Notes (Signed)
   Despite administration of IV diltiazem injection and a one-time dose of IV digoxin, the patient's ventricular rates remain elevated into the 130s to 150s bpm, and A. fib.  Further management of her pulmonary status will be quite difficult.  Treatment options are limited.  Ideally, we would like to avoid IV amiodarone in an effort to prevent pharmacological cardioversion.  Continued use of digoxin is not ideal given her frail state.  With the severe pulmonary status, beta-blockers would not be great.  We will transition her back from Cardizem CD to a diltiazem drip with recommendation to titrate dose to allow for ventricular rates in the 1 teens to 120s, as BP allows.

## 2021-11-13 NOTE — Consult Note (Signed)
NAME:  Margaret Stevenson, MRN:  [id number], DOB:  [birth year], LOS: 8 ADMISSION DATE:  11/05/2021, CONSULTATION DATE: 11/11/2021 REFERRING MD: Irine Seal, MD, CHIEF COMPLAINT: Persistent hypoxia  Brief Patient Report  75 y.o. female w/ a h/o COPD and tob abuse, who was admitted 11/28 w/ resp failure and COPD w/ finding of PAF. HPI  75 year old female with past medical history of COPD, previous melanoma and pulmonary nodules who was admitted on 11/05/2021 with difficulty breathing.  CT scan of the chest on 11/03/2021 after an ER visit that was negative for pulmonary embolism and showed stable pulmonary nodules. She presented back to the ED with worsening respiratory symptoms.  Hospital Course: During the course of admission patient developed episode of SVT with rates as high as 180s.  Valsalva maneuver and carotid massage performed with improvement.  Adenosine was considered but rhythm improved prior to being administered.  Patient later given IV Cardizem push with improvement.  Cardiology consulted patient started on Eliquis and p.o. Cardizem 30 mg every 6 hours with plans to switch to CD before DC.  Patient continued to require increased oxygenation from 89 L HFNC O2.  Weaning attempts was made to 6 L but patient desatted to low 80s at rest.  This morning patient continues to complain of shortness of breath states she is satting in the 90s but feels like she is satting in the 80s.  Due to increased oxygen requirement with persistent hypoxia PCCM consulted to assist with management.  11/12/21- family including son is at bedside, patient on HFNC 54/50.  Patient is slow to answer but lucid. 11/13/21 - Patient is continuing to require HFNC she has improved to 49/50.  She is overall with poor prognosis. We met together with palliative care.  We discussed hospice.    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   Elevated CA 19-9 level 05/08/2017   Arthritis 05/07/2017   Asthma without status asthmaticus 05/07/2017   Borderline diabetes 05/07/2017   Hx of adenomatous colonic polyps 05/07/2017   Tobacco dependence 05/07/2017   Sepsis ([place]) 04/27/2017   Disease of biliary tract     Obstruction of bile duct     Jaundice     Hypoxia 10/11/2016   Anxiety 10/11/2016   COPD (chronic obstructive pulmonary disease) ([place]) 10/06/2016   CAP (community acquired pneumonia) 10/06/2016   Hyperglycemia 10/06/2016   Vaccine counseling 06/21/2016   History of malignant melanoma of skin 08/03/2013    Significant Hospital Events   11/28: Admitted to progressive care unit with acute on chronic respiratory failure secondary to COPD exacerbation/anxiety 11/30: Continue to wean oxygen to 5 L/min. Cardiology consulted for Afib with RVR 12/1: Patient with increased oxygen requirement 8 to 9 L HFNC failed weaning. 12/2: Remains on high flow nasal cannula at 8 to 9 L to keep sats greater than 90% 12/3: Still requiring high oxygen also in A. fib with RVR 12/4:  PCCM consulted Consults:  Cardiology PCCM  Procedures:  None  Significant Diagnostic Tests:  11/28: Chest Xray> no acute  cardiopulmonary process, emphysema 12/4: 7 mm right upper lobe pulmonary nodule, better demonstrated on recent chest CT. 2. Peribronchial opacities with lower lobe predominance may represent bronchitic changes or interstitial pulmonary edema. 3. Emphysema  Micro Data:  11/28: SARS-CoV-2 PCR> negative 11/28: Influenza PCR> negative  Antimicrobials:  None  OBJECTIVE  Blood pressure 138/80, pulse (!) 114, temperature 97.7 F (36.5  C), resp. rate 18, SpO2 92 %.    FiO2 (%):  [50 %-53 %] 50 %   Intake/Output Summary (Last 24 hours) at 11/13/2021 1231 Last data filed at 11/13/2021 0950 Gross per 24 hour  Intake 480 ml  Output 900 ml  Net -420 ml    There were no vitals filed for this visit.  Physical Examination  GENERAL: 75 year-old patient lying in the bed with no acute distress.  EYES: Pupils equal, round, reactive to light and accommodation. No scleral icterus. Extraocular muscles intact.  HEENT: Head atraumatic, normocephalic. Oropharynx and nasopharynx clear.  NECK:  Supple, no jugular venous distention. No thyroid enlargement, no tenderness.  LUNGS: Decreased breath sounds bilaterally, no wheezing, rales,rhonchi or crepitation. No use of accessory muscles of respiration.  CARDIOVASCULAR: S1, S2 normal. No murmurs, rubs, or gallops.  ABDOMEN: Soft, nontender, nondistended. Bowel sounds present. No organomegaly or mass.  EXTREMITIES: No pedal edema, cyanosis, or clubbing.  NEUROLOGIC: Cranial nerves II through XII are intact.  Muscle strength 5/5 in all extremities. Sensation intact. Gait not checked.  PSYCHIATRIC: The patient is alert and oriented x 3.  SKIN: No obvious rash, lesion, or ulcer.   Labs/imaging that I havepersonally reviewed  (right click and "Reselect all SmartList Selections" daily)     Labs   CBC: Recent Labs  Lab 11/08/21 0441 11/09/21 0540 11/12/21 0409 11/13/21 0415  WBC 11.4* 10.2 15.9* 17.1*  NEUTROABS  --   --  14.8*  --   HGB 13.7 14.2 13.7 15.2*  HCT 44.7 47.3* 43.5 50.0*  MCV 101.8* 103.1* 100.9* 101.0*  PLT 191 194 215 226     Basic Metabolic Panel: Recent Labs  Lab 11/08/21 0441 11/09/21 0540 11/10/21 0519 11/11/21 0551 11/12/21 0409 11/13/21 0415  NA 139 139 136 138 138 140  K 3.2* 4.7 5.3* 4.8 4.4 4.4  CL 91* 93* 90* 90* 91* 92*  CO2 42* 43* 39* 43* 40* 41*  GLUCOSE 143* 103* 140* 149* 143* 149*  BUN _0 26*  CREATININE 0.41* 0.30* 0.32*  0.34* 0.33* 0.34*  CALCIUM 8.6* 8.9 8.6* 9.1 8.6* 9.0  MG 2.3 2.3  --   --  2.5*  --   PHOS  --   --   --   --  4.7*  --     GFR: Estimated Creatinine Clearance: 57.5 mL/min (A) (by C-G formula based on SCr of 0.34 mg/dL (L)). Recent Labs  Lab 11/08/21 0441 11/09/21 0540 11/10/21 0519 11/11/21 0551 11/12/21 0409 11/13/21 0415  PROCALCITON  --   --  <0.10 <0.10  --   --   WBC 11.4* 10.2  --   --  15.9* 17.1*     Liver Function Tests: No results for input(s): AST, ALT, ALKPHOS, BILITOT, PROT, ALBUMIN in the last 168 hours. No results for input(s): LIPASE, AMYLASE in the last 168 hours. No results for input(s): AMMONIA in the last 168 hours.  ABG    Component Value Date/Time   PHART 7.44 11/12/2021 0847   PCO2ART 68 ([place]) 11/12/2021 0847   PO2ART 53 (L) 11/12/2021 9629  HCO3 46.2 (H) 11/12/2021 0847   O2SAT 88.3 11/12/2021 0847     Coagulation Profile: No results for input(s): INR, PROTIME in the last 168 hours.  Cardiac Enzymes: No results for input(s): CKTOTAL, CKMB, CKMBINDEX, TROPONINI in the last 168 hours.  HbA1C: Hgb A1c MFr Bld  Date/Time Value Ref Range Status  04/27/2017 12:17 AM 4.5 (L) 4.8 - 5.6 % Final    Comment:    (NOTE)         Pre-diabetes: 5.7 - 6.4         Diabetes: >6.4         Glycemic control for adults with diabetes: <7.0     CBG: No results for input(s): GLUCAP in the last 168 hours.  Review of Systems:   Review of Systems  Constitutional: Negative.   HENT: Negative.    Eyes: Negative.   Respiratory:  Positive for cough, sputum production, shortness of breath and wheezing. Negative for hemoptysis.   Cardiovascular:  Positive for palpitations, orthopnea and leg swelling. Negative for chest pain, claudication and PND.  Gastrointestinal: Negative.   Genitourinary: Negative.   Musculoskeletal:  Positive for back pain and neck pain.  Skin: Negative.   Neurological: Negative.   Endo/Heme/Allergies: Negative.    Psychiatric/Behavioral:  The patient is nervous/anxious.     Past Medical History  She,  has a past medical history of Arthritis, Cancer ([place]), COPD (chronic obstructive pulmonary disease) ([place]), Dyspnea, and Jaundice (04/2017).   Surgical History    Past Surgical History:  Procedure Laterality Date   BREAST BIOPSY Right 08/26/2018   Affirm Bx ("x" clip) path pending   BUNIONECTOMY     COLONOSCOPY     COLONOSCOPY WITH PROPOFOL N/A 05/26/2017   Procedure: COLONOSCOPY WITH PROPOFOL;  Surgeon: Lucilla Lame, MD;  Location: [facility];  Service: Endoscopy;  Laterality: N/A;  please do not move appt time   ERCP N/A 04/25/2017   Procedure: ENDOSCOPIC RETROGRADE CHOLANGIOPANCREATOGRAPHY (ERCP);  Surgeon: Lucilla Lame, MD;  Location: [facility] ENDOSCOPY;  Service: Endoscopy;  Laterality: N/A;   ERCP N/A 09/30/2017   Procedure: ENDOSCOPIC RETROGRADE CHOLANGIOPANCREATOGRAPHY (ERCP) Pancreatic stent removal;  Surgeon: Lucilla Lame, MD;  Location: [facility] ENDOSCOPY;  Service: Endoscopy;  Laterality: N/A;   POLYPECTOMY  05/26/2017   Procedure: POLYPECTOMY;  Surgeon: Lucilla Lame, MD;  Location: [place];  Service: Endoscopy;;   TONSILLECTOMY       Social History   reports that she quit smoking 10 days ago. Her smoking use included cigarettes. She has a 28.50 pack-year smoking history. She has never used smokeless tobacco. She reports current alcohol use of about 7.0 standard drinks per week. She reports that she does not use drugs.   Family History   Her family history includes Breast cancer (age of onset: 96) in her mother.   Allergies Allergies  Allergen Reactions   Penicillins Hives    Has patient had a PCN reaction causing immediate rash, facial/tongue/throat swelling, SOB or lightheadedness with hypotension: no Has patient had a PCN reaction causing severe rash involving mucus membranes or skin necrosis: no Has patient had a PCN reaction that required hospitalization no Has  patient had a PCN reaction occurring within the last 10 years: no If all of the above answers are "NO", then may proceed with Cephalosporin use.      Home Medications  Prior to Admission medications   Medication Sig Start Date End Date Taking? Authorizing Provider  albuterol (PROVENTIL) (2.5 MG/3ML) 0.083% nebulizer solution Take 3 mLs (  2.5 mg total) by nebulization every 4 (four) hours as needed for wheezing or shortness of breath. 11/03/21 11/03/22  Naaman Plummer, MD  albuterol (VENTOLIN HFA) 108 (90 Base) MCG/ACT inhaler Inhale 2 puffs into the lungs every 4 (four) hours as needed for shortness of breath.  08/27/16 01/24/19  [provider]  aspirin EC 81 MG tablet Take 81 mg by mouth daily. 10/11/21   [provider]  Fluticasone-Salmeterol (ADVAIR) 250-50 MCG/DOSE AEPB Inhale 1 puff into the lungs 2 (two) times daily.    [provider]  lovastatin (MEVACOR) 40 MG tablet Take 40 mg by mouth daily with supper. 10/11/21   [provider]  OXYGEN Inhale 3 L into the lungs. 1 L when not active    [provider]  tiotropium (SPIRIVA) 18 MCG inhalation capsule Place 18 mcg into inhaler and inhale at bedtime.  08/27/16 01/24/19  [provider]  TURMERIC PO Take by mouth.    [provider]    Scheduled Meds:  acetylcysteine  4 mL Nebulization TID   apixaban  5 mg Oral BID   budesonide (PULMICORT) nebulizer solution  0.5 mg Nebulization BID   diltiazem  360 mg Oral QPM   diltiazem       fluticasone  2 spray Each Nare Daily   guaiFENesin  1,200 mg Oral BID   ipratropium  0.5 mg Nebulization Q6H   levalbuterol  1.25 mg Nebulization Q6H   [START ON 11/14/2021] methylPREDNISolone (SOLU-MEDROL) injection  40 mg Intravenous Q24H   multivitamin with minerals  1 tablet Oral Daily   pravastatin  40 mg Oral q1800   Continuous Infusions:   PRN Meds:.ALPRAZolam, bisacodyl, bisacodyl, levalbuterol, morphine injection, naproxen,  polyethylene glycol, senna-docusate, sodium chloride, sodium chloride flush    Assessment & Plan:  Acute on chronic hypercapnic hypoxic respiratory failure secondary to AECOPD  PMHx: Asthma, COPD on Chronic home oxygen at 3L, current everyday smoker  -Supplemental oxygen as needed, maintain SpO2 > 88% -Intermittent chest x-ray & ABG PRN -Ensure adequate pulmonary hygiene -I have reduced steroids ot solumedrol 49m daily - no need for antibiotics -Palliative care and hospice recommendation today and reviewed end of life care options.  Paroxysmal atrial fibrillation Runs of SVT New since admission likely in the setting of AECOPD as above -Continue diltiazem 240 daily for rate control and as BP permits -Continue Eliquis 536mBID  [id number]  -If uncontrolled with above plan for AAD per cardio recommendations -Replace electrolytes,  Hyperglycemia likely steroid-induced -Check hemoglobin A1c -CBGs -Sliding scale insulin -Follow hyper/hypoglycemia protocol ` Best practice:  Diet:  Oral Pain/Anxiety/Delirium protocol (if indicated): No VAP protocol (if indicated): Not indicated DVT prophylaxis: Systemic AC GI prophylaxis: N/A Glucose control:  SSI Yes Central venous access:  N/A Arterial line:  N/A Foley:  N/A Mobility:  bed rest  PT consulted: Yes Last date of multidisciplinary goals of care discussion [12/4] Code Status:  DNR Disposition: Progressive Care Unit   = Goals of Care = Code Status Order: _0 @   Primary Emergency Contact: Areesha, [place] Phone: [phone] wishes to pursue ongoing treatment, but concurred that if deteriorated to pulselessness, patient would prefer a natural death as opposed to invasive measures such as CPR and intubation.      FuOttie GlazierM.D.  Pulmonary & CrTrujillo Alto

## 2021-11-13 NOTE — Progress Notes (Signed)
PROGRESS NOTE    Margaret Stevenson   FXO:[id number]  DOB: 1946-05-08  PCP: Dion Body, MD    DOA: 11/05/2021 LOS: 8    Brief Narrative / Hospital Course to Date:   75 year old female with past medical history of COPD, previous melanoma and pulmonary nodules who was admitted on 11/05/2021 with difficulty breathing.  CT scan of the chest on 11/03/2021 after an ER visit that was negative for pulmonary embolism and showed stable pulmonary nodules.   Admitted with COPD exacerbation with acute hypoxic respiratory failure initially requiring BiPAP.  Started on steroids.  Patient also developed episodes of SVT.  Cardiology consulted.  Assessment & Plan   Principal Problem:   COPD with acute exacerbation ([place]) Active Problems:   Tobacco dependence   Acute on chronic respiratory failure with hypoxia (HCC)   Paroxysmal atrial fibrillation (HCC)   Malnutrition of moderate degree   Hypokalemia   Acute on chronic hypoxic respiratory failure - POA with shortness of breath, EMS recorded pulse ox in the 80s on her home oxygen.  Required BiPAP on arrival.   CTA chest 11/26 negative for PE. 11/30: Weaned to 5 L/min O2. 12/1-3: Increased oxygen requirement 8 to 9 L/min HFNC  12/5: now on heated HFNC at 45 L/min.   ABG: 7.44/68/53/46.2 12/6: on 50 L/min heated HFNC with sats low 90's.  COPD exacerbation - POA.  Severe advanced COPD.  Follows with Dr. Raul Del.   Has wheezing and poor air movement, continues to have increasing oxygen needs and feels no improvement, feels like struggling to breath with sats in low 90's. --Pulmonology following, see their recs --Palliative care consulted, appreciate assistance --Continue Zithromax  --Solu-Medrol 60>>40 mg IV BID --Continue Xopenex given tachycardia --Continue Atrovent and Dulera  --Low dose morphine PRN air hunger --Mucomyst added --Chest PT w vest --Pulmonary hygiene  Paroxysmal A-fib - HR in the ED after admission was as high as  180s.  Initially thought to be SVT, no improvement with Valsalva or carotid massage.  Adenosine was considered, but rhythm improved prior to this being pushed.   Later given IV Cardizem push with improvement. Echo on 11/07/2021: EF 55 to 60%, indeterminate diastolic parameters, no WMA's 12/6: recurrent RVR 150-160's. HR difficult to control due to underlying respiratory issues. --Cardiology consulted, see their recs --Started on Eliquis (CHA2DS2-VASc score 2 (age, gender)) --Rate controlled on PO Cardizem 60 mg q6h --Placed back on Cardizem drip (from oral CD) --Optimize respiratory status  Hypokalemia - replaced 12/1 for K 3.2.  Monitor and replace as needed.  Tobacco abuse - Nicotine patch PRN  Pulmonary nodules - PET scan April consistent with benign etiology.  But recommended following up with CT scans to monitor.  Constipation - bowel regimen ordered, including enema and/or suppository if needed.     DVT prophylaxis:  apixaban (ELIQUIS) tablet 5 mg   Diet:  Diet Orders (From admission, onward)     Start     Ordered   11/05/21 2313  Diet regular Room service appropriate? Yes; Fluid consistency: Thin  Diet effective now       Question Answer Comment  Room service appropriate? Yes   Fluid consistency: Thin      11/05/21 2314              Code Status: DNR   Subjective 11/13/21    Pt seen with son at bedside this morning.  Currently on 50 L/min heated high flow o2.  Feels like she is still gasping for air  and asks what we can do so she doesn't feel like she's gasping.  Low dose Xanax given for anxiety and son reports she was able to rest a bit.  We discussed use of morphine for this and she is agreeable to try it.  She expresses frustration about not getting any better and still struggling to breath.   Disposition Plan & Communication   Status is: Inpatient  Remains inpatient appropriate because: on IV therapies and requiring increased o2 supplementation   Family  communication - son at bedside on rounds today 12/6  Consults, Procedures, Significant Events   Consultants:  Palliative care Pulmonology Cardiology  Procedures:  none  Antimicrobials:  Anti-infectives (From admission, onward)    Start     Dose/Rate Route Frequency Ordered Stop   11/07/21 2000  azithromycin (ZITHROMAX) 500 mg in sodium chloride 0.9 % 250 mL IVPB        500 mg 250 mL/hr over 60 Minutes Intravenous Every 24 hours 11/07/21 1926 11/11/21 2105         Micro    Objective   Vitals:   11/13/21 1500 11/13/21 1600 11/13/21 1630 11/13/21 1934  BP: (!) 145/83 (!) 150/65 138/68 (!) 161/53  Pulse: (!) 108 92 91 93  Resp: 18 (!) 21 (!) 22 15  Temp:      TempSrc:      SpO2: 92% 96% 97% 99%    Intake/Output Summary (Last 24 hours) at 11/13/2021 1956 Last data filed at 11/13/2021 1820 Gross per 24 hour  Intake 720 ml  Output 900 ml  Net -180 ml   There were no vitals filed for this visit.  Physical Exam:  General exam: awake, no acute distress, frail, underweight, chronically ill-appearing Respiratory system: On heated HFNC at 50 L/min, No wheezing, improved aeration, normal respiratory effort at rest Cardiovascular system: irregularly irregular, no peripheral edema.   Central nervous system: A&O x3.  Normal speech.  Grossly nonfocal exam Psychiatry: normal mood but no eye contact (keeps eyes closed), congruent affect, judgement and insight appear normal  Labs   Data Reviewed: I have personally reviewed following labs and imaging studies  CBC: Recent Labs  Lab 11/08/21 0441 11/09/21 0540 11/12/21 0409 11/13/21 0415  WBC 11.4* 10.2 15.9* 17.1*  NEUTROABS  --   --  14.8*  --   HGB 13.7 14.2 13.7 15.2*  HCT 44.7 47.3* 43.5 50.0*  MCV 101.8* 103.1* 100.9* 101.0*  PLT 191 194 215 323   Basic Metabolic Panel: Recent Labs  Lab 11/08/21 0441 11/09/21 0540 11/10/21 0519 11/11/21 0551 11/12/21 0409 11/13/21 0415  NA 139 139 136 138 138 140  K 3.2*  4.7 5.3* 4.8 4.4 4.4  CL 91* 93* 90* 90* 91* 92*  CO2 42* 43* 39* 43* 40* 41*  GLUCOSE 143* 103* 140* 149* 143* 149*  BUN 17 12 13 16 19  26*  CREATININE 0.41* 0.30* 0.32* 0.34* 0.33* 0.34*  CALCIUM 8.6* 8.9 8.6* 9.1 8.6* 9.0  MG 2.3 2.3  --   --  2.5*  --   PHOS  --   --   --   --  4.7*  --    GFR: Estimated Creatinine Clearance: 57.5 mL/min (A) (by C-G formula based on SCr of 0.34 mg/dL (L)). Liver Function Tests: No results for input(s): AST, ALT, ALKPHOS, BILITOT, PROT, ALBUMIN in the last 168 hours. No results for input(s): LIPASE, AMYLASE in the last 168 hours. No results for input(s): AMMONIA in the last 168 hours. Coagulation  Profile: No results for input(s): INR, PROTIME in the last 168 hours. Cardiac Enzymes: No results for input(s): CKTOTAL, CKMB, CKMBINDEX, TROPONINI in the last 168 hours. BNP (last 3 results) No results for input(s): PROBNP in the last 8760 hours. HbA1C: No results for input(s): HGBA1C in the last 72 hours. CBG: No results for input(s): GLUCAP in the last 168 hours. Lipid Profile: No results for input(s): CHOL, HDL, LDLCALC, TRIG, CHOLHDL, LDLDIRECT in the last 72 hours. Thyroid Function Tests: Recent Labs    11/12/21 0409  TSH 1.188   Anemia Panel: No results for input(s): VITAMINB12, FOLATE, FERRITIN, TIBC, IRON, RETICCTPCT in the last 72 hours. Sepsis Labs: Recent Labs  Lab 11/10/21 0519 11/11/21 0551  PROCALCITON <0.10 <0.10    Recent Results (from the past 240 hour(s))  Resp Panel by RT-PCR (Flu A&B, Covid) Nasopharyngeal Swab     Status: None   Collection Time: 11/05/21 10:02 PM   Specimen: Nasopharyngeal Swab; Nasopharyngeal(NP) swabs in vial transport medium  Result Value Ref Range Status   SARS Coronavirus 2 by RT PCR NEGATIVE NEGATIVE Final    Comment: (NOTE) SARS-CoV-2 target nucleic acids are NOT DETECTED.  The SARS-CoV-2 RNA is generally detectable in upper respiratory specimens during the acute phase of infection. The  lowest concentration of SARS-CoV-2 viral copies this assay can detect is 138 copies/mL. A negative result does not preclude SARS-Cov-2 infection and should not be used as the sole basis for treatment or other patient management decisions. A negative result may occur with  improper specimen collection/handling, submission of specimen other than nasopharyngeal swab, presence of viral mutation(s) within the areas targeted by this assay, and inadequate number of viral copies(<138 copies/mL). A negative result must be combined with clinical observations, patient history, and epidemiological information. The expected result is Negative.  Fact Sheet for Patients:  EntrepreneurPulse.com.au  Fact Sheet for Healthcare Providers:  IncredibleEmployment.be  This test is no t yet approved or cleared by the [place] FDA and  has been authorized for detection and/or diagnosis of SARS-CoV-2 by FDA under an Emergency Use Authorization (EUA). This EUA will remain  in effect (meaning this test can be used) for the duration of the COVID-19 declaration under Section 564(b)(1) of the Act, 21 U.S.C.section 360bbb-3(b)(1), unless the authorization is terminated  or revoked sooner.       Influenza A by PCR NEGATIVE NEGATIVE Final   Influenza B by PCR NEGATIVE NEGATIVE Final    Comment: (NOTE) The Xpert Xpress SARS-CoV-2/FLU/RSV plus assay is intended as an aid in the diagnosis of influenza from Nasopharyngeal swab specimens and should not be used as a sole basis for treatment. Nasal washings and aspirates are unacceptable for Xpert Xpress SARS-CoV-2/FLU/RSV testing.  Fact Sheet for Patients: EntrepreneurPulse.com.au  Fact Sheet for Healthcare Providers: IncredibleEmployment.be  This test is not yet approved or cleared by the [place] FDA and has been authorized for detection and/or diagnosis of SARS-CoV-2 by FDA under  an Emergency Use Authorization (EUA). This EUA will remain in effect (meaning this test can be used) for the duration of the COVID-19 declaration under Section 564(b)(1) of the Act, 21 U.S.C. section 360bbb-3(b)(1), unless the authorization is terminated or revoked.  Performed at [facility], [address] [place], [place] [address]       Imaging Studies   No results found.   Medications   Scheduled Meds:  apixaban  5 mg Oral BID   budesonide (PULMICORT) nebulizer solution  0.5 mg Nebulization BID  fluticasone  2 spray Each Nare Daily   guaiFENesin  1,200 mg Oral BID   ipratropium  0.5 mg Nebulization Q6H   levalbuterol  1.25 mg Nebulization Q6H   [START ON 11/14/2021] methylPREDNISolone (SOLU-MEDROL) injection  40 mg Intravenous Q24H   multivitamin with minerals  1 tablet Oral Daily   pravastatin  40 mg Oral q1800   Continuous Infusions:  diltiazem (CARDIZEM) infusion 7.5 mg/hr (11/13/21 1526)        LOS: 8 days    Time spent: 30 minutes with > 50% spent at bedside and in coordination of care     Ezekiel Slocumb, DO [facility]  11/13/2021, 7:56 PM      If 7PM-7AM, please contact night-coverage. How to contact the [facility] Attending or Consulting provider [place] or covering provider during after hours [place], for this patient?    Check the care team in [facility] and look for a) attending/consulting [facility] provider listed and b) the [facility] team listed Log into [email or web address] and use [place]'s universal password to access. If you do not have the password, please contact the hospital operator. Locate the [facility] provider you are looking for under [facility] and page to a number that you can be directly reached. If you still have difficulty reaching the provider, please page the [facility] (Director on Call) for the Hospitalists listed on amion for assistance.

## 2021-11-13 NOTE — Progress Notes (Signed)
Progress Note  Patient Name: Margaret Stevenson Date of Encounter: 11/13/2021  Primary Cardiologist: New to [facility] - consult by [place]  Subjective   She feels like her dyspnea is improved this morning.  No chest pain or palpitations.  Over the past 24 hours she has converted to sinus rhythm with short run of SVT followed by redevelopment of A. fib, which she remains in at this time.  She remains on HFNC.   Inpatient Medications    Scheduled Meds:  acetylcysteine  4 mL Nebulization TID   apixaban  5 mg Oral BID   budesonide (PULMICORT) nebulizer solution  0.5 mg Nebulization BID   diltiazem  360 mg Oral QPM   diltiazem       fluticasone  2 spray Each Nare Daily   guaiFENesin  1,200 mg Oral BID   ipratropium  0.5 mg Nebulization Q6H   levalbuterol  1.25 mg Nebulization Q6H   methylPREDNISolone (SOLU-MEDROL) injection  40 mg Intravenous BID   multivitamin with minerals  1 tablet Oral Daily   pravastatin  40 mg Oral q1800   Continuous Infusions:  PRN Meds: ALPRAZolam, bisacodyl, bisacodyl, levalbuterol, naproxen, polyethylene glycol, senna-docusate, sodium chloride, sodium chloride flush   Vital Signs    Vitals:   11/13/21 0421 11/13/21 0630 11/13/21 0834 11/13/21 0859  BP: 113/62 126/75  (!) 144/76  Pulse: 71   85  Resp: (!) 21 19  18   Temp: 97.6 F (36.4 C)     TempSrc:      SpO2: 90%  91% 92%    Intake/Output Summary (Last 24 hours) at 11/13/2021 1111 Last data filed at 11/13/2021 0950 Gross per 24 hour  Intake 480 ml  Output 900 ml  Net -420 ml    There were no vitals filed for this visit.  Telemetry    Currently in A. fib with ventricular rates in the low 100s to 120s bpm, yesterday afternoon she did develop sinus rhythm with a short run of SVT - Personally Reviewed  ECG    No new tracings - Personally Reviewed  Physical Exam   GEN: Frail and chronically ill appearing; No acute distress.   Neck: No JVD. Cardiac: Mildly tachycardic, IRIR, no  murmurs, rubs, or gallops.  Respiratory: Coarse and diminished breath sounds bilaterally. Remains on HFNC. GI: Soft, nontender, non-distended.   MS: No edema; No deformity. Neuro:  Alert and oriented x 3; Nonfocal.  Psych: Normal affect.  Labs    Chemistry Recent Labs  Lab 11/11/21 0551 11/12/21 0409 11/13/21 0415  NA 138 138 140  K 4.8 4.4 4.4  CL 90* 91* 92*  CO2 43* 40* 41*  GLUCOSE 149* 143* 149*  BUN 16 19 26*  CREATININE 0.34* 0.33* 0.34*  CALCIUM 9.1 8.6* 9.0  GFRNONAA >60 >60 >60  ANIONGAP 5 7 7       Hematology Recent Labs  Lab 11/09/21 0540 11/12/21 0409 11/13/21 0415  WBC 10.2 15.9* 17.1*  RBC 4.59 4.31 4.95  HGB 14.2 13.7 15.2*  HCT 47.3* 43.5 50.0*  MCV 103.1* 100.9* 101.0*  MCH 30.9 31.8 30.7  MCHC 30.0 31.5 30.4  RDW 13.1 12.8 13.0  PLT 194 215 226     Cardiac EnzymesNo results for input(s): TROPONINI in the last 168 hours. No results for input(s): TROPIPOC in the last 168 hours.   BNP Recent Labs  Lab 11/11/21 1032  BNP 96.4      DDimer No results for input(s): DDIMER in the last 168  hours.   Radiology    DG Chest Port 1 View  Result Date: 11/11/2021 IMPRESSION: 1. 7 mm right upper lobe pulmonary nodule, better demonstrated on recent chest CT. 2. Peribronchial opacities with lower lobe predominance may represent bronchitic changes or interstitial pulmonary edema. 3. Emphysema. Electronically Signed   By: Fidela Salisbury M.D.   On: 11/11/2021 12:19    Cardiac Studies   2D echo 11/07/2021: 1. Left ventricular ejection fraction, by estimation, is 55 to 60%. Left  ventricular ejection fraction by PLAX is 56 %. The left ventricle has  normal function. The left ventricle has no regional wall motion  abnormalities. Left ventricular diastolic  parameters are indeterminate.   2. Right ventricular systolic function is normal. The right ventricular  size is normal.   3. The mitral valve is normal in structure. No evidence of mitral  valve  regurgitation.   4. The aortic valve was not well visualized. Aortic valve regurgitation  is not visualized.   5. The inferior vena cava is normal in size with greater than 50%  respiratory variability, suggesting right atrial pressure of 3 mmHg.  Patient Profile     75 y.o. female with history of COPD with tobacco use who was admitted on 11/28 with acute on chronic hypoxic respiratory failure secondary to COPD exacerbation with noted PAF.  Assessment & Plan    1. PAF: -She has had episodes of spontaneous conversion to sinus rhythm with redevelopment of A. fib several times at this admission -Continue Cardizem CD3 160 mg daily for rate control -She has as needed IV diltiazem for sustained tachycardic rates -If needed, could use as needed digoxin for added rate control, will place order (renal function normal) -CHADS2VASc at least 3 (age x 1, vascular disease, sex category) -Eliquis -Echo with preserved LVSF as above -TSH normal  2. Acute on chronic hypoxic respiratory failure with COPD exacerbation: -CO2 remains elevated  -BNP normal -Management per IM  3. Coronary artery calcification: -Noted on CT imaging  -No chest pain -High sensitivity troponin negative x 4 this admission  -EF normal on echo  4. HLD: -LDL 88 in 08/2021  -On lovastatin at home, pravastatin during admission  -Consider escalation to high intensity statin in the outpatient setting     For questions or updates, please contact [facility] Please consult [email or web address] for contact info under Cardiology/STEMI.    Signed, Christell Faith, PA-C [facility] Pager: [phone] 11/13/2021, 11:11 AM

## 2021-11-13 NOTE — Progress Notes (Signed)
PT Cancellation Note  Patient Details Name: Margaret Stevenson MRN: [id number] DOB: [birth year]   Cancelled Treatment:    Reason Eval/Treat Not Completed: Medical issues which prohibited therapy.  Chart reviewed (pt on 50 L/min on HFNC).  Pt noted to be resting in bed with HR fluctuating between 124-152 bpm on telemetry screen in hallway.  Discussed pt's status with pt's nurse; pt currently not medically appropriate for therapy.  Will hold PT at this time and re-attempt PT session at a later date/time as medically appropriate.  Leitha Bleak, PT 11/13/21, 9:56 AM

## 2021-11-13 NOTE — Progress Notes (Signed)
OT Cancellation Note  Patient Details Name: Margaret Stevenson MRN: [id number] DOB: 14-Apr-1946   Cancelled Treatment:    Reason Eval/Treat Not Completed: Patient not medically ready. Per chart review and discussion with tx team, pt's HR remain elevated (into 130s-150s bpm) and is not appropriate for therapy at this time. OT to hold this date and re-attempt as pt is medically appropriate.  Fredirick Maudlin, OTR/L [place]

## 2021-11-13 NOTE — Consult Note (Signed)
Consultation Note Date: 11/13/2021   Patient Name: Margaret Stevenson  DOB: 1946/12/04  MRN: [id number]  Age / Sex: 75 y.o., female  PCP: Margaret Body, MD Referring Physician: Ezekiel Slocumb, DO  Reason for Consultation: Establishing goals of care  HPI/Patient Profile: 75 y.o. 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 fib.   Clinical Assessment and Goals of Care: I have reviewed medical records including EPIC notes, labs and imaging, received report from RN, assessed the patient.  Only child, son Margaret Stevenson is present from [facility].  We met at the bedside along with pulmonologist, Dr. Lanney Stevenson to discuss diagnosis prognosis, Margaret Stevenson, EOL wishes, disposition and options.  [name] is lying quietly in bed.  She greets me, making and somewhat keeping eye contact.  She appears acutely/chronically ill and quite frail.  She is alert and oriented, able to make her needs known.  Her son is at bedside.  Pulmonologist, Dr. Loni Stevenson is present.  He talks about her acute health problems, some options for care.  We talked about the benefits of symptom management for COPD.  He also talks about some logistics related to disposition.  Dr. Loni Stevenson finishes his consult.  I introduced Palliative Medicine as specialized medical care for people living with serious illness. It focuses on providing relief from the symptoms and stress of a serious illness. The goal is to improve quality of life for both the patient and the family.  We discussed a brief life review of the patient.  [name] has been living independently, able to complete her ADLs.  She is having difficulty with  IADLs and has friends and family who come and assist.    We then focused on their current illness.  [name] tells me that she feels that she has improved over the last few days.  We talked about time for outcomes and further improvements.  She shares that if possible she would like to attempt short-term rehab with the goal of returning to her own home.  She asks about the possibility of returning home where she can complete her ADLs.  I share that since she does not have Medicaid she would not qualify for "homemaker" services.  She shares that she has friends and family who come usually every other day.  We talked about at home services provided through hospice care.  The natural disease trajectory and expectations at EOL were discussed.  Prognosis discussed with permission.  It is often very difficult for prognostication of COPD.  Advanced directives, concepts specific to code status, were considered and discussed.  We talked about "treat the treatable, but allowing natural death".  [name] elects DNR and her son/healthcare surrogate states that he can will about her wish.  Hospice and Palliative Care services outpatient were explained  and offered.  We talked about at home hospice services for "treat the treatable" care.  We talked about in-home services provided.  We talked about the benefits of symptom management with hospice care.  Discussed the importance of continued conversation with family and the medical providers regarding overall plan of care and treatment options, ensuring decisions are within the context of the patient's values and GOCs. Questions and concerns were addressed.   The family was encouraged to call with questions or concerns.  PMT will continue to support holistically.  Conference with attending, bedside nursing staff, pulmonary, transition of care team related to patient condition, needs, goals of care, disposition.   HCPOA   NEXT OF KIN -only child, son Margaret Stevenson    SUMMARY OF RECOMMENDATIONS   Treat the treatable but no CPR or intubation Time for outcomes Considering short-term rehab if able We also discussed home with treat the treatable hospice care  Code Status/Advance Care Planning: DNR -verified with patient and son/HC POA  Symptom Management:  Per hospitalist, no additional needs at this time.  Palliative Prophylaxis:  Frequent Pain Assessment and Oral Care  Additional Recommendations (Limitations, Scope, Preferences): Continue to treat the treatable but no CPR or intubation, time for outcomes  Psycho-social/Spiritual:  Desire for further Chaplaincy support:no Additional Recommendations: Caregiving  Support/Resources and Education on Hospice  Prognosis:  Unable to determine, based on outcomes.  Prognostication can be difficult for people with COPD  Discharge Planning:  To be determined, based on outcomes.  Would attempt short-term rehab if able.       Primary Diagnoses: Present on Admission:  COPD with acute exacerbation ([place])  Tobacco dependence   I have reviewed the medical record, interviewed the patient and family, and examined the patient. The following aspects are pertinent.  Past Medical History:  Diagnosis Date   Arthritis    hands   Cancer ([place])    melanoma 07/2013   COPD (chronic obstructive pulmonary disease) ([facility])    Dyspnea    Jaundice 04/2017   Social History   Socioeconomic History   Marital status: Single    Spouse name: Not on file   Number of children: Not on file   Years of education: Not on file   Highest education level: Not on file  Occupational History   Not on file  Tobacco Use   Smoking status: Former    Packs/day: 0.50    Years: 57.00    Pack years: 28.50    Types: Cigarettes    Quit date: 11/03/2021    Years since quitting: 0.0   Smokeless tobacco: Never  Vaping Use   Vaping Use: Former  Substance and Sexual Activity   Alcohol use: Yes    Alcohol/week: 7.0  standard drinks    Types: 7 Glasses of wine per week   Drug use: No   Sexual activity: Not Currently  Other Topics Concern   Not on file  Social History Narrative   Not on file   Social Determinants of Health   Financial Resource Strain: Not on file  Food Insecurity: Not on file  Transportation Needs: Not on file  Physical Activity: Not on file  Stress: Not on file  Social Connections: Not on file   Family History  Problem Relation Age of Onset   Breast cancer Mother 67   Scheduled Meds:  apixaban  5 mg Oral BID   budesonide (PULMICORT) nebulizer solution  0.5 mg Nebulization BID   diltiazem  fluticasone  2 spray Each Nare Daily   guaiFENesin  1,200 mg Oral BID   ipratropium  0.5 mg Nebulization Q6H   levalbuterol  1.25 mg Nebulization Q6H   [START ON 11/14/2021] methylPREDNISolone (SOLU-MEDROL) injection  40 mg Intravenous Q24H   multivitamin with minerals  1 tablet Oral Daily   pravastatin  40 mg Oral q1800   Continuous Infusions:  diltiazem (CARDIZEM) infusion 5 mg/hr (11/13/21 1334)   PRN Meds:.ALPRAZolam, bisacodyl, bisacodyl, levalbuterol, morphine injection, naproxen, polyethylene glycol, senna-docusate, sodium chloride, sodium chloride flush Medications Prior to Admission:  Prior to Admission medications   Medication Sig Start Date End Date Taking? Authorizing Provider  albuterol (PROVENTIL) (2.5 MG/3ML) 0.083% nebulizer solution Take 3 mLs (2.5 mg total) by nebulization every 4 (four) hours as needed for wheezing or shortness of breath. 11/03/21 11/03/22  Naaman Plummer, MD  albuterol (VENTOLIN HFA) 108 (90 Base) MCG/ACT inhaler Inhale 2 puffs into the lungs every 4 (four) hours as needed for shortness of breath.  08/27/16 01/24/19  [provider]  aspirin EC 81 MG tablet Take 81 mg by mouth daily. 10/11/21   [provider]  Fluticasone-Salmeterol (ADVAIR) 250-50 MCG/DOSE AEPB Inhale 1 puff into the lungs 2 (two) times daily.    [provider]  lovastatin (MEVACOR) 40 MG tablet Take 40 mg by mouth daily with supper. 10/11/21   [provider]  OXYGEN Inhale 3 L into the lungs. 1 L when not active    [provider]  tiotropium (SPIRIVA) 18 MCG inhalation capsule Place 18 mcg into inhaler and inhale at bedtime.  08/27/16 01/24/19  [provider]  TURMERIC PO Take by mouth.    [provider]   Allergies  Allergen Reactions   Penicillins Hives    Has patient had a PCN reaction causing immediate rash, facial/tongue/throat swelling, SOB or lightheadedness with hypotension: no Has patient had a PCN reaction causing severe rash involving mucus membranes or skin necrosis: no Has patient had a PCN reaction that required hospitalization no Has patient had a PCN reaction occurring within the last 10 years: no If all of the above answers are "NO", then may proceed with Cephalosporin use.    Review of Systems  Unable to perform ROS: Other   Physical Exam Vitals and nursing note reviewed.  Constitutional:      General: She is not in acute distress.    Appearance: She is ill-appearing.     Comments: Frail and thin  HENT:     Head: Normocephalic and atraumatic.     Mouth/Throat:     Mouth: Mucous membranes are moist.  Cardiovascular:     Rate and Rhythm: Normal rate.  Pulmonary:     Effort: Pulmonary effort is normal.     Comments: Able to make sentences Skin:    General: Skin is warm and dry.  Neurological:     Mental Status: She is alert and oriented to person, place, and time.  Psychiatric:        Mood and Affect: Mood normal.        Behavior: Behavior normal.    Vital Signs: BP 138/80 (BP Location: Right Arm)   Pulse (!) 114   Temp 97.7 F (36.5 C)   Resp 18   SpO2 92%  Pain Scale: 0-10   Pain Score: 0-No pain   SpO2: SpO2: 92 % O2 Device:SpO2: 92 % O2 Flow Rate: .O2 Flow Rate (L/min): 50 L/min  IO: Intake/output summary:  Intake/Output  Summary (Last 24 hours)  at 11/13/2021 1431 Last data filed at 11/13/2021 0950 Gross per 24 hour  Intake 480 ml  Output 900 ml  Net -420 ml    LBM: Last BM Date: 11/11/21 Baseline Weight:   Most recent weight:       Palliative Assessment/Data:   Flowsheet Rows    Flowsheet Row Most Recent Value  Intake Tab   Referral Department Hospitalist  Unit at Time of Referral Intermediate Care Unit  Palliative Care Primary Diagnosis Pulmonary  Date Notified 11/12/21  Palliative Care Type New Palliative care  Reason for referral Clarify Goals of Care  Date of Admission 11/05/21  Date first seen by Palliative Care 11/13/21  # of days Palliative referral response time 1 Day(s)  # of days IP prior to Palliative referral 7  Clinical Assessment   Palliative Performance Scale Score 30%  Pain Max last 24 hours Not able to report  Pain Min Last 24 hours Not able to report  Dyspnea Max Last 24 Hours Not able to report  Dyspnea Min Last 24 hours Not able to report  Psychosocial & Spiritual Assessment   Palliative Care Outcomes        Time In: 1320 Time Out: 1430  Time Total: 70 minutes  Greater than 50%  of this time was spent counseling and coordinating care related to the above assessment and plan.  Signed by: Drue Novel, NP   Please contact Palliative Medicine Team phone at [phone] for questions and concerns.  For individual provider: See Shea Evans

## 2021-11-14 ENCOUNTER — Encounter: Payer: Self-pay | Admitting: Internal Medicine

## 2021-11-14 DIAGNOSIS — J9621 Acute and chronic respiratory failure with hypoxia: Secondary | ICD-10-CM | POA: Diagnosis not present

## 2021-11-14 DIAGNOSIS — I480 Paroxysmal atrial fibrillation: Secondary | ICD-10-CM | POA: Diagnosis not present

## 2021-11-14 DIAGNOSIS — J441 Chronic obstructive pulmonary disease with (acute) exacerbation: Secondary | ICD-10-CM | POA: Diagnosis not present

## 2021-11-14 LAB — BASIC METABOLIC PANEL
Anion gap: 4 — ABNORMAL LOW (ref 5–15)
BUN: 23 mg/dL (ref 8–23)
CO2: 39 mmol/L — ABNORMAL HIGH (ref 22–32)
Calcium: 8.3 mg/dL — ABNORMAL LOW (ref 8.9–10.3)
Chloride: 94 mmol/L — ABNORMAL LOW (ref 98–111)
Creatinine, Ser: 0.34 mg/dL — ABNORMAL LOW (ref 0.44–1.00)
GFR, Estimated: >60 mL/min (ref >60)
Glucose, Bld: 133 mg/dL — ABNORMAL HIGH (ref 70–99)
Potassium: 4 mmol/L (ref 3.5–5.1)
Sodium: 137 mmol/L (ref 135–145)

## 2021-11-14 LAB — CBC
HCT: 44.7 % (ref 36.0–46.0)
Hemoglobin: 14 g/dL (ref 12.0–15.0)
MCH: 31.6 pg (ref 26.0–34.0)
MCHC: 31.3 g/dL (ref 30.0–36.0)
MCV: 100.9 fL — ABNORMAL HIGH (ref 80.0–100.0)
Platelets: 207 10*3/uL (ref 150–400)
RBC: 4.43 MIL/uL (ref 3.87–5.11)
RDW: 13 % (ref 11.5–15.5)
WBC: 19.4 10*3/uL — ABNORMAL HIGH (ref 4.0–10.5)
nRBC: 0 % (ref 0.0–0.2)

## 2021-11-14 MED ORDER — DILTIAZEM HCL 30 MG PO TABS
60.0000 mg | ORAL_TABLET | Freq: Three times a day (TID) | ORAL | Status: DC
  Administered 2021-11-14 – 2021-11-17 (×10): 60 mg via ORAL
  Filled 2021-11-14 (×10): qty 2

## 2021-11-14 NOTE — Progress Notes (Signed)
PT Cancellation Note  Patient Details Name: Margaret Stevenson MRN: [id number] DOB: 11-Aug-1946   Cancelled Treatment:    Reason Eval/Treat Not Completed: Medical issues which prohibited therapy. RN asks that I try back around 12:30 after patient can have morphine. Will return later to re-attempt PT.     Anjanette Gilkey 11/14/2021, 11:51 AM

## 2021-11-14 NOTE — Progress Notes (Signed)
Occupational Therapy Treatment Patient Details Name: Margaret Stevenson MRN: [id number] DOB: 01-20-1946 Today's Date: 11/14/2021   History of present illness Pt is a 75 y.o. female with medical history significant for Nicotine dependence and COPD on home oxygen at 3 L who presents to the ED for the second time in 2 days with COPD exacerbation unrelieved with home bronchodilators.  EMS recorded O2 sats in the 80s on her home flow rate and she was placed on CPAP. PMH of COPD, previous melanoma and pulmonary nodules.   OT comments  Pt seen for OT treatment on this date. Session coordinated with PT to maximize safety during ADLs/functional transfers. Upon arrival to room, pt awake and seated upright in bed with son present. Unable to formally assess cognition d/t pt agitated during orientation questions. Pt agreeable to OT/PT tx however frequently stating that she is unable to perform ADL/mobility prior to attempting on own first. Pt currently requires MOD A to don socks while in bed, MAX A for supine<>sit transfers, and MIN-MOD A for trunk support while sitting EOB. Pt sat EOB for 5 mins, with SpO2 88-93% and HR 80s-90s. Pt refused further mobility/ADLs. OT/PT attempted to engage pt in goal setting for future therapy sessions, however pt refusing after multiple attempts and instead stating "to fix your bad attitude." Will continue to follow POC. Discharge recommendation remains appropriate.     Recommendations for follow up therapy are one component of a multi-disciplinary discharge planning process, led by the attending physician.  Recommendations may be updated based on patient status, additional functional criteria and insurance authorization.    Follow Up Recommendations  Skilled nursing-short term rehab (<3 hours/day)    Assistance Recommended at Discharge Frequent or constant Supervision/Assistance  Equipment Recommendations  Other (comment) (defer to next venue of care)       Precautions /  Restrictions Precautions Precautions: Fall Restrictions Weight Bearing Restrictions: No       Mobility Bed Mobility Overal bed mobility: Needs Assistance Bed Mobility: Rolling;Sidelying to Sit;Supine to Sit;Sit to Supine Rolling: Mod assist Sidelying to sit: Max assist;HOB elevated Supine to sit: Max assist;HOB elevated Sit to supine: Mod assist;HOB elevated   General bed mobility comments: unwiling to try to assist herself. Reports she can't breathe despite O2 sats between 88-93 and patient talking throughout.    Transfers                   General transfer comment: pt adamantly declined all transfers.     Balance Overall balance assessment: Needs assistance Sitting-balance support: Feet supported;Bilateral upper extremity supported Sitting balance-Leahy Scale: Poor Sitting balance - Comments: Attempted to return to supine immediately after sitting up. Stating "I can't do this, I can't breathe." Requires encouragement to continue sitting. O2 saturations between 88-90% sitting.       Standing balance comment: Pt declined to attempt                           ADL either performed or assessed with clinical judgement   ADL Overall ADL's : Needs assistance/impaired                     Lower Body Dressing: Moderate assistance;Bed level Lower Body Dressing Details (indicate cue type and reason): to don/doff socks via figure-4 position in bed               General ADL Comments: Pt adamantly declining additional ADLs this date  Cognition Arousal/Alertness: Awake/alert Behavior During Therapy: Agitated Overall Cognitive Status: Difficult to assess                                 General Comments: Unable to formally assess orientation d/t pt agitated and argumentative during therapist questions/feedback          Exercises Other Exercises Other Exercises: OT/PT attempted to engage pt in goal setting for future therapy  sessions, however pt refusing after multiple attempts and instead stating "to fix your bad attitude"           Pertinent Vitals/ Pain       Pain Assessment: No/denies pain         Frequency  Min 2X/week        Progress Toward Goals  OT Goals(current goals can now be found in the care plan section)  Progress towards OT goals: Progressing toward goals  Acute Rehab OT Goals Patient Stated Goal: pt refused to state goal OT Goal Formulation: With patient Time For Goal Achievement: 11/21/21 Potential to Achieve Goals: [place] Discharge plan remains appropriate;Frequency remains appropriate    Co-evaluation    PT/OT/SLP Co-Evaluation/Treatment: Yes Reason for Co-Treatment: Necessary to address cognition/behavior during functional activity;For patient/therapist safety PT goals addressed during session: Mobility/safety with mobility OT goals addressed during session: ADL's and self-care      AM-PAC OT "6 Clicks" Daily Activity     Outcome Measure   Help from another person eating meals?: None Help from another person taking care of personal grooming?: A Little Help from another person toileting, which includes using toliet, bedpan, or urinal?: A Lot Help from another person bathing (including washing, rinsing, drying)?: A Lot Help from another person to put on and taking off regular upper body clothing?: A Little Help from another person to put on and taking off regular lower body clothing?: A Lot 6 Click Score: 16    End of Session    OT Visit Diagnosis: Muscle weakness (generalized) (M62.81);Other abnormalities of gait and mobility (R26.89)   Activity Tolerance Treatment limited secondary to agitation;Other (comment) (pt with limited cooperation and self limiting behavior)   Patient Left in bed;with call bell/phone within reach;with bed alarm set;with family/visitor present   Nurse Communication Mobility status        Time: 0272-5366 OT Time Calculation  (min): 23 min  Charges: OT General Charges $OT Visit: 1 Visit OT Treatments $Self Care/Home Management : 8-22 mins  Fredirick Maudlin, OTR/L [place]

## 2021-11-14 NOTE — Progress Notes (Signed)
Physical Therapy Treatment Patient Details Name: Margaret Stevenson MRN: [id number] DOB: 03-Apr-1946 Today's Date: 11/14/2021   History of Present Illness Pt is a 75 y.o. female with medical history significant for Nicotine dependence and COPD on home oxygen at 3 L who presents to the ED for the second time in 2 days with COPD exacerbation unrelieved with home bronchodilators.  EMS recorded O2 sats in the 80s on her home flow rate and she was placed on CPAP. PMH of COPD, previous melanoma and pulmonary nodules.    PT Comments    Patient received in supine. Son at bedside. Patient quick to state she can't when asked to perform mobility tasks. States she can't breathe. O2 sats between 88-93 during session on HFNC. She is easily agitated during session. Patient requires mod-max assist with bed mobility. Attempted to lie back down immediately after sitting up. She ultimately sat up for about 5 minutes with min- mod assist.  Patient will continue to benefit from skilled PT as able and as she allows to improve strength and functional independence.        Recommendations for follow up therapy are one component of a multi-disciplinary discharge planning process, led by the attending physician.  Recommendations may be updated based on patient status, additional functional criteria and insurance authorization.  Follow Up Recommendations  Skilled nursing-short term rehab (<3 hours/day)     Assistance Recommended at Discharge Frequent or constant Supervision/Assistance  Equipment Recommendations  None recommended by PT    Recommendations for Other Services       Precautions / Restrictions Precautions Precautions: Fall Restrictions Weight Bearing Restrictions: No     Mobility  Bed Mobility Overal bed mobility: Needs Assistance Bed Mobility: Rolling;Sidelying to Sit;Supine to Sit;Sit to Supine Rolling: Mod assist Sidelying to sit: Max assist;HOB elevated Supine to sit: Max assist;HOB  elevated Sit to supine: Mod assist;HOB elevated   General bed mobility comments: unwiling to try to assist herself. Reports she can't breathe despite O2 sats between 88-93 and patient talking throughout.    Transfers                   General transfer comment: pt adamantly declined all transfers.    Ambulation/Gait               General Gait Details: declines   Stairs             Wheelchair Mobility    Modified Rankin (Stroke Patients Only)       Balance Overall balance assessment: Needs assistance Sitting-balance support: Feet supported;Bilateral upper extremity supported Sitting balance-Leahy Scale: Poor Sitting balance - Comments: Attempted to return to supine immediately after sitting up. Stating "I can't do this, I can't breathe." Requires encouragement to continue sitting. O2 saturations between 88-90% sitting.                                    Cognition Arousal/Alertness: Awake/alert Behavior During Therapy: Agitated Overall Cognitive Status: Within Functional Limits for tasks assessed                                 General Comments: Pt did not respond well to encouragement, argumentative        Exercises      General Comments        Pertinent Vitals/Pain Pain Assessment: No/denies pain  Home Living                          Prior Function            PT Goals (current goals can now be found in the care plan section) Acute Rehab PT Goals Patient Stated Goal: patient wouldnt tell me goal states "what do you think it is?" Time For Goal Achievement: 11/22/21 Potential to Achieve Goals: Poor Progress towards PT goals: Not progressing toward goals - comment (patient argumentative. States she can't when asked to do basic tasks.)    Frequency    Min 2X/week      PT Plan Current plan remains appropriate    Co-evaluation PT/OT/SLP Co-Evaluation/Treatment: Yes Reason for  Co-Treatment: For patient/therapist safety;To address functional/ADL transfers;Necessary to address cognition/behavior during functional activity PT goals addressed during session: Mobility/safety with mobility;Balance        AM-PAC PT "6 Clicks" Mobility   Outcome Measure  Help needed turning from your back to your side while in a flat bed without using bedrails?: A Lot Help needed moving from lying on your back to sitting on the side of a flat bed without using bedrails?: A Lot Help needed moving to and from a bed to a chair (including a wheelchair)?: A Lot Help needed standing up from a chair using your arms (e.g., wheelchair or bedside chair)?: Total Help needed to walk in hospital room?: Total Help needed climbing 3-5 steps with a railing? : Total 6 Click Score: 9    End of Session Equipment Utilized During Treatment: Oxygen Activity Tolerance: Patient limited by lethargy;Treatment limited secondary to agitation Patient left: in bed;with call bell/phone within reach;with bed alarm set;with family/visitor present Nurse Communication: Mobility status PT Visit Diagnosis: Other abnormalities of gait and mobility (R26.89);Muscle weakness (generalized) (M62.81)     Time: 5852-7782 PT Time Calculation (min) (ACUTE ONLY): 23 min  Charges:  $Therapeutic Activity: 8-22 mins                     Othman Masur, PT, GCS 11/14/21,1:34 PM

## 2021-11-14 NOTE — Progress Notes (Signed)
Progress Note  Patient Name: Margaret Stevenson Date of Encounter: 11/14/2021  [facility] Cardiologist: Kate Sable, MD   Subjective   Currently on high flow nasal oxygen.  Started on IV Cardizem, converted to sinus rhythm.  Breathing is same as prior.  Inpatient Medications    Scheduled Meds:  apixaban  5 mg Oral BID   budesonide (PULMICORT) nebulizer solution  0.5 mg Nebulization BID   diltiazem  60 mg Oral TID   fluticasone  2 spray Each Nare Daily   guaiFENesin  1,200 mg Oral BID   ipratropium  0.5 mg Nebulization Q6H   levalbuterol  1.25 mg Nebulization Q6H   methylPREDNISolone (SOLU-MEDROL) injection  40 mg Intravenous Q24H   multivitamin with minerals  1 tablet Oral Daily   pravastatin  40 mg Oral q1800   Continuous Infusions:  PRN Meds: ALPRAZolam, bisacodyl, bisacodyl, levalbuterol, morphine injection, naproxen, polyethylene glycol, senna-docusate, sodium chloride, sodium chloride flush   Vital Signs    Vitals:   11/14/21 0758 11/14/21 0826 11/14/21 1146 11/14/21 1324  BP:  126/61 122/64   Pulse:  80 84   Resp:  16 16   Temp:  98.3 F (36.8 C) 97.6 F (36.4 C)   TempSrc:  Oral Oral   SpO2: 95% 94% 95% 90%    Intake/Output Summary (Last 24 hours) at 11/14/2021 1433 Last data filed at 11/14/2021 1359 Gross per 24 hour  Intake 1077.66 ml  Output 900 ml  Net 177.66 ml   Last 3 Weights 11/03/2021 04/06/2020 01/26/2018  Weight (lbs) 130 lb 130 lb 137 lb 12.6 oz  Weight (kg) 58.968 kg 58.968 kg 62.5 kg      Telemetry    Sinus rhythm, heart rate 89- Personally Reviewed  ECG     - Personally Reviewed  Physical Exam   GEN: Moderate respiratory distress, appears frail Neck: No JVD Cardiac: RRR, no murmurs, rubs, or gallops.  Respiratory: No wheezing, diminished breath sounds GI: Soft, nontender, non-distended  MS: No edema; No deformity. Neuro:  Nonfocal  Psych: Normal affect   Labs    High Sensitivity Troponin:   Recent Labs  Lab  11/03/21 0252 11/03/21 0536 11/05/21 2057 11/06/21 0204  TROPONINIHS 5 5 8 6      Chemistry Recent Labs  Lab 11/08/21 0441 11/09/21 0540 11/10/21 0519 11/12/21 0409 11/13/21 0415 11/14/21 0350  NA 139 139   < > 138 140 137  K 3.2* 4.7   < > 4.4 4.4 4.0  CL 91* 93*   < > 91* 92* 94*  CO2 42* 43*   < > 40* 41* 39*  GLUCOSE 143* 103*   < > 143* 149* 133*  BUN 17 12   < > 19 26* 23  CREATININE 0.41* 0.30*   < > 0.33* 0.34* 0.34*  CALCIUM 8.6* 8.9   < > 8.6* 9.0 8.3*  MG 2.3 2.3  --  2.5*  --   --   GFRNONAA >60 >60   < > >60 >60 >60  ANIONGAP 6 3*   < > 7 7 4*   < > = values in this interval not displayed.    Lipids No results for input(s): CHOL, TRIG, HDL, LABVLDL, LDLCALC, CHOLHDL in the last 168 hours.  Hematology Recent Labs  Lab 11/12/21 0409 11/13/21 0415 11/14/21 0350  WBC 15.9* 17.1* 19.4*  RBC 4.31 4.95 4.43  HGB 13.7 15.2* 14.0  HCT 43.5 50.0* 44.7  MCV 100.9* 101.0* 100.9*  MCH 31.8 30.7  31.6  MCHC 31.5 30.4 31.3  RDW 12.8 13.0 13.0  PLT 215 226 207   Thyroid  Recent Labs  Lab 11/12/21 0409  TSH 1.188    BNP Recent Labs  Lab 11/11/21 1032  BNP 96.4    DDimer No results for input(s): DDIMER in the last 168 hours.   Radiology    No results found.  Cardiac Studies   Echo 10/2021 1. Left ventricular ejection fraction, by estimation, is 55 to 60%. Left  ventricular ejection fraction by PLAX is 56 %. The left ventricle has  normal function. The left ventricle has no regional wall motion  abnormalities. Left ventricular diastolic  parameters are indeterminate.   2. Right ventricular systolic function is normal. The right ventricular  size is normal.   3. The mitral valve is normal in structure. No evidence of mitral valve  regurgitation.   4. The aortic valve was not well visualized. Aortic valve regurgitation  is not visualized.   5. The inferior vena cava is normal in size with greater than 50%  respiratory variability, suggesting right  atrial pressure of 3 mmHg.   Patient Profile     75 y.o. female history of hyperlipidemia, COPD presenting with shortness of breath, diagnosed with COPD exacerbation being seen for A. fib RVR  Assessment & Plan    A. fib RVR -Currently in sinus rhythm on Cardizem drip 5 mg/h -Start p.o. Cardizem 60 mg 3 times daily. -If maintaining sinus rhythm over the next 2 days, consider converting/consolidating to Cardizem CD 180 mg daily. -Continue Eliquis 5 mg twice daily  2.  COPD exacerbation -Inhalers, steroids as per primary team  3.  Hyperlipidemia -PTA Pravachol   Total encounter time 35 minutes  Greater than 50% was spent in counseling and coordination of care with the patient     Signed, Kate Sable, MD  11/14/2021, 2:33 PM

## 2021-11-14 NOTE — Progress Notes (Signed)
Progress Note    Margaret Stevenson  OQH:[id number] DOB: [birth year]  DOA: 11/05/2021 PCP: Dion Body, MD      Brief Narrative:    Medical records reviewed and are as summarized below:  Margaret Stevenson is a 75 y.o. female with medical history significant for COPD, chronic hypoxemic respiratory failure, pulmonary nodules, history of melanoma, who presented to the hospital because of difficulty breathing.      Assessment/Plan:   Principal Problem:   COPD with acute exacerbation (HCC) Active Problems:   Tobacco dependence   Acute on chronic respiratory failure with hypoxia (HCC)   Paroxysmal atrial fibrillation (HCC)   Malnutrition of moderate degree   Hypokalemia   Nutrition Problem: Moderate Malnutrition Etiology: chronic illness (COPD)  Signs/Symptoms: mild fat depletion, moderate fat depletion, mild muscle depletion, moderate muscle depletion    Acute on chronic hypoxemic respiratory failure: She is on oxygen via heated humidified high flow nasal cannula.  Taper down oxygen as able.  COPD exacerbation: Continue IV steroids and bronchodilators.  Continue chest PT with vest.  Follow-up with pulmonologist.  Completed 5 days of azithromycin.  Paroxysmal atrial fibrillation with RVR: She is in normal sinus rhythm.  IV Cardizem infusion has been switched to short-acting oral Cardizem.  Continue Eliquis.  Follow-up with cardiologist.  Hypokalemia: Improved  Pulmonary nodules: PET scan in April 2022 suggestive of benign etiology.  Outpatient follow-up with CT scan for continued surveillance recommended.  Tobacco use disorder: Counseled to quit smoking.  Diet Order             Diet regular Room service appropriate? Yes; Fluid consistency: Thin  Diet effective now                      Consultants: Pulmonologist Intensivist Palliative care  Procedures: None    Medications:    apixaban  5 mg Oral BID   budesonide (PULMICORT) nebulizer  solution  0.5 mg Nebulization BID   diltiazem  60 mg Oral TID   fluticasone  2 spray Each Nare Daily   guaiFENesin  1,200 mg Oral BID   ipratropium  0.5 mg Nebulization Q6H   levalbuterol  1.25 mg Nebulization Q6H   methylPREDNISolone (SOLU-MEDROL) injection  40 mg Intravenous Q24H   multivitamin with minerals  1 tablet Oral Daily   pravastatin  40 mg Oral q1800   Continuous Infusions:   Anti-infectives (From admission, onward)    Start     Dose/Rate Route Frequency Ordered Stop   11/07/21 2000  azithromycin (ZITHROMAX) 500 mg in sodium chloride 0.9 % 250 mL IVPB        500 mg 250 mL/hr over 60 Minutes Intravenous Every 24 hours 11/07/21 1926 11/11/21 2105              Family Communication/Anticipated D/C date and plan/Code Status   DVT prophylaxis:  apixaban (ELIQUIS) tablet 5 mg     Code Status: DNR  Family Communication: None Disposition Plan: Plan to discharge to SNF   Status is: Inpatient  Remains inpatient appropriate because: Hypoxemic on oxygen via heated humidified HFNC           Subjective:   C/o shortness of breath  Objective:    Vitals:   11/14/21 0826 11/14/21 1146 11/14/21 1324 11/14/21 1608  BP: 126/61 122/64  (!) 160/65  Pulse: 80 84  96  Resp: 16 16  16   Temp: 98.3 F (36.8 C) 97.6 F (36.4 C)  98.3  F (36.8 C)  TempSrc: Oral Oral  Oral  SpO2: 94% 95% 90% 92%   No data found.   Intake/Output Summary (Last 24 hours) at 11/14/2021 1632 Last data filed at 11/14/2021 1359 Gross per 24 hour  Intake 1077.66 ml  Output 900 ml  Net 177.66 ml   There were no vitals filed for this visit.  Exam:  GEN: NAD SKIN: Warm and dry EYES: No pallor or icterus ENT: MMM CV: RRR PULM: Decreased air entry bilaterally.  No wheezing or rales ABD: soft, ND, NT, +BS CNS: AAO x 3, non focal EXT: No edema or tenderness        Data Reviewed:   I have personally reviewed following labs and imaging studies:  Labs: Labs show the  following:   Basic Metabolic Panel: Recent Labs  Lab 11/08/21 0441 11/09/21 0540 11/10/21 0519 11/11/21 0551 11/12/21 0409 11/13/21 0415 11/14/21 0350  NA 139 139 136 138 138 140 137  K 3.2* 4.7 5.3* 4.8 4.4 4.4 4.0  CL 91* 93* 90* 90* 91* 92* 94*  CO2 42* 43* 39* 43* 40* 41* 39*  GLUCOSE 143* 103* 140* 149* 143* 149* 133*  BUN 17 12 13 16 19  26* 23  CREATININE 0.41* 0.30* 0.32* 0.34* 0.33* 0.34* 0.34*  CALCIUM 8.6* 8.9 8.6* 9.1 8.6* 9.0 8.3*  MG 2.3 2.3  --   --  2.5*  --   --   PHOS  --   --   --   --  4.7*  --   --    GFR Estimated Creatinine Clearance: 57.5 mL/min (A) (by C-G formula based on SCr of 0.34 mg/dL (L)). Liver Function Tests: No results for input(s): AST, ALT, ALKPHOS, BILITOT, PROT, ALBUMIN in the last 168 hours. No results for input(s): LIPASE, AMYLASE in the last 168 hours. No results for input(s): AMMONIA in the last 168 hours. Coagulation profile No results for input(s): INR, PROTIME in the last 168 hours.  CBC: Recent Labs  Lab 11/08/21 0441 11/09/21 0540 11/12/21 0409 11/13/21 0415 11/14/21 0350  WBC 11.4* 10.2 15.9* 17.1* 19.4*  NEUTROABS  --   --  14.8*  --   --   HGB 13.7 14.2 13.7 15.2* 14.0  HCT 44.7 47.3* 43.5 50.0* 44.7  MCV 101.8* 103.1* 100.9* 101.0* 100.9*  PLT 191 194 215 226 207   Cardiac Enzymes: No results for input(s): CKTOTAL, CKMB, CKMBINDEX, TROPONINI in the last 168 hours. BNP (last 3 results) No results for input(s): PROBNP in the last 8760 hours. CBG: No results for input(s): GLUCAP in the last 168 hours. D-Dimer: No results for input(s): DDIMER in the last 72 hours. Hgb A1c: No results for input(s): HGBA1C in the last 72 hours. Lipid Profile: No results for input(s): CHOL, HDL, LDLCALC, TRIG, CHOLHDL, LDLDIRECT in the last 72 hours. Thyroid function studies: Recent Labs    11/12/21 0409  TSH 1.188   Anemia work up: No results for input(s): VITAMINB12, FOLATE, FERRITIN, TIBC, IRON, RETICCTPCT in the last 72  hours. Sepsis Labs: Recent Labs  Lab 11/09/21 0540 11/10/21 0519 11/11/21 0551 11/12/21 0409 11/13/21 0415 11/14/21 0350  PROCALCITON  --  <0.10 <0.10  --   --   --   WBC 10.2  --   --  15.9* 17.1* 19.4*    Microbiology Recent Results (from the past 240 hour(s))  Resp Panel by RT-PCR (Flu A&B, Covid) Nasopharyngeal Swab     Status: None   Collection Time: 11/05/21 10:02 PM  Specimen: Nasopharyngeal Swab; Nasopharyngeal(NP) swabs in vial transport medium  Result Value Ref Range Status   SARS Coronavirus 2 by RT PCR NEGATIVE NEGATIVE Final    Comment: (NOTE) SARS-CoV-2 target nucleic acids are NOT DETECTED.  The SARS-CoV-2 RNA is generally detectable in upper respiratory specimens during the acute phase of infection. The lowest concentration of SARS-CoV-2 viral copies this assay can detect is 138 copies/mL. A negative result does not preclude SARS-Cov-2 infection and should not be used as the sole basis for treatment or other patient management decisions. A negative result may occur with  improper specimen collection/handling, submission of specimen other than nasopharyngeal swab, presence of viral mutation(s) within the areas targeted by this assay, and inadequate number of viral copies(<138 copies/mL). A negative result must be combined with clinical observations, patient history, and epidemiological information. The expected result is Negative.  Fact Sheet for Patients:  EntrepreneurPulse.com.au  Fact Sheet for Healthcare Providers:  IncredibleEmployment.be  This test is no t yet approved or cleared by the [place] FDA and  has been authorized for detection and/or diagnosis of SARS-CoV-2 by FDA under an Emergency Use Authorization (EUA). This EUA will remain  in effect (meaning this test can be used) for the duration of the COVID-19 declaration under Section 564(b)(1) of the Act, 21 U.S.C.section 360bbb-3(b)(1), unless the  authorization is terminated  or revoked sooner.       Influenza A by PCR NEGATIVE NEGATIVE Final   Influenza B by PCR NEGATIVE NEGATIVE Final    Comment: (NOTE) The Xpert Xpress SARS-CoV-2/FLU/RSV plus assay is intended as an aid in the diagnosis of influenza from Nasopharyngeal swab specimens and should not be used as a sole basis for treatment. Nasal washings and aspirates are unacceptable for Xpert Xpress SARS-CoV-2/FLU/RSV testing.  Fact Sheet for Patients: EntrepreneurPulse.com.au  Fact Sheet for Healthcare Providers: IncredibleEmployment.be  This test is not yet approved or cleared by the [place] FDA and has been authorized for detection and/or diagnosis of SARS-CoV-2 by FDA under an Emergency Use Authorization (EUA). This EUA will remain in effect (meaning this test can be used) for the duration of the COVID-19 declaration under Section 564(b)(1) of the Act, 21 U.S.C. section 360bbb-3(b)(1), unless the authorization is terminated or revoked.  Performed at [facility], [address] [place], [place] [address]     Procedures and diagnostic studies:  No results found.             LOS: 9 days   Tayvin Preslar  [employer], advertising on [email or web address]. If 7PM-7AM, please contact night-coverage at [email or web address]     11/14/2021, 4:32 PM

## 2021-11-15 DIAGNOSIS — Z7189 Other specified counseling: Secondary | ICD-10-CM | POA: Diagnosis not present

## 2021-11-15 DIAGNOSIS — Z515 Encounter for palliative care: Secondary | ICD-10-CM | POA: Diagnosis not present

## 2021-11-15 DIAGNOSIS — J9621 Acute and chronic respiratory failure with hypoxia: Secondary | ICD-10-CM | POA: Diagnosis not present

## 2021-11-15 DIAGNOSIS — J9601 Acute respiratory failure with hypoxia: Secondary | ICD-10-CM | POA: Diagnosis not present

## 2021-11-15 DIAGNOSIS — J441 Chronic obstructive pulmonary disease with (acute) exacerbation: Secondary | ICD-10-CM | POA: Diagnosis not present

## 2021-11-15 MED ORDER — MILK AND MOLASSES ENEMA
1.0000 | Freq: Once | RECTAL | Status: DC
  Filled 2021-11-15 (×2): qty 240

## 2021-11-15 MED ORDER — AMIODARONE HCL 200 MG PO TABS
400.0000 mg | ORAL_TABLET | Freq: Two times a day (BID) | ORAL | Status: DC
Start: 2021-11-15 — End: 2021-11-24
  Administered 2021-11-15 – 2021-11-24 (×19): 400 mg via ORAL
  Filled 2021-11-15 (×19): qty 2

## 2021-11-15 MED ORDER — DILTIAZEM HCL 25 MG/5ML IV SOLN
15.0000 mg | INTRAVENOUS | Status: AC
  Administered 2021-11-15: 15 mg via INTRAVENOUS
  Filled 2021-11-15: qty 5

## 2021-11-15 MED ORDER — MAGNESIUM HYDROXIDE 400 MG/5ML PO SUSP
200.0000 mL | Freq: Once | ORAL | Status: AC
  Administered 2021-11-15: 200 mL via RECTAL
  Filled 2021-11-15: qty 60

## 2021-11-15 MED ORDER — SENNOSIDES-DOCUSATE SODIUM 8.6-50 MG PO TABS
2.0000 | ORAL_TABLET | Freq: Two times a day (BID) | ORAL | Status: DC
  Administered 2021-11-15 – 2021-11-20 (×11): 2 via ORAL
  Filled 2021-11-15 (×13): qty 2

## 2021-11-15 NOTE — Progress Notes (Signed)
Nutrition Follow-up  DOCUMENTATION CODES:   Non-severe (moderate) malnutrition in context of chronic illness  INTERVENTION:   -Continue Ensure Enlive po BID, each supplement provides 350 kcal and 20 grams of protein (pt desires to bring in home supply) -Continue MVI with minerals daily -Continue Magic cup TID with meals, each supplement provides 290 kcal and 9 grams of protein   NUTRITION DIAGNOSIS:   Moderate Malnutrition related to chronic illness (COPD) as evidenced by mild fat depletion, moderate fat depletion, mild muscle depletion, moderate muscle depletion.  Ongoing  GOAL:   Patient will meet greater than or equal to 90% of their needs  Progressing   MONITOR:   PO intake, Supplement acceptance, Labs, Weight trends, Skin, I & O's  REASON FOR ASSESSMENT:   Consult Assessment of nutrition requirement/status  ASSESSMENT:   Margaret Stevenson is a 75 y.o. female with medical history significant for Nicotine dependence and COPD on home oxygen at 3 L who presents to the ED for the second time in 2 days with COPD exacerbation unrelieved with home bronchodilators.  EMS recorded O2 sats in the 80s on her home flow rate and she was placed on CPAP.  History is limited due to her clinical condition.  Son is at the bedside.  She has had no chest pain, fever or chills no any nausea, vomiting, abdominal pain or diarrhea  Reviewed I/O's: +310 ml x 24 hours and +1.1 L since admission  UOP: 800 ml x 24 hours   Pt in with MD at time of visit. Per cardiology notes, pt now back in sinus rhythm.   Pt with improving oral intake. Noted meal completions 30-100%.  Pt refused to be given Ensure in the hospital and prefers son to bring in her home supply.   Medications reviewed and include solu-medrol.   Labs reviewed.   Diet Order:   Diet Order             Diet regular Room service appropriate? Yes; Fluid consistency: Thin  Diet effective now                   EDUCATION  NEEDS:   Education needs have been addressed  Skin:  Skin Assessment: Reviewed RN Assessment  Last BM:  11/11/21  Height:   Ht Readings from Last 1 Encounters:  11/03/21 5' 6.5" (1.689 m)    Weight:   Wt Readings from Last 1 Encounters:  11/03/21 59 kg    Ideal Body Weight:  60.2 kg  BMI:  There is no height or weight on file to calculate BMI.  Estimated Nutritional Needs:   Kcal:  2050-2250  Protein:  105-120 grams  Fluid:  > 2 L    Loistine Chance, RD, LDN, [place] Registered Dietitian II Certified Diabetes Care and Education Specialist Please refer to [facility] for RD and/or RD on-call/weekend/after hours pager

## 2021-11-15 NOTE — Plan of Care (Signed)
  Problem: Education: Goal: Knowledge of General Education information will improve Description: Including pain rating scale, medication(s)/side effects and non-pharmacologic comfort measures Outcome: Progressing   Problem: Health Behavior/Discharge Planning: Goal: Ability to manage health-related needs will improve Outcome: Not Progressing   

## 2021-11-15 NOTE — Progress Notes (Signed)
Physical Therapy Treatment Patient Details Name: Margaret Stevenson MRN: [id number] DOB: 1946/12/07 Today's Date: 11/15/2021   History of Present Illness Pt is a 75 y.o. female with medical history significant for Nicotine dependence and COPD on home oxygen at 3 L who presents to the ED for the second time in 2 days with COPD exacerbation unrelieved with home bronchodilators.  EMS recorded O2 sats in the 80s on her home flow rate and she was placed on CPAP. PMH of COPD, previous melanoma and pulmonary nodules.    PT Comments    Pt was long sitting in bed with supportive son sitting at bedside. She was on 45 L HHFNC. Sao2 was > 88% throughout. Resting HR in low 80s that stayed less than 100bpm in static standing. Pt had some A fib with elevated HR into 150-160s earlier this date. Pt is self limiting throughout session but was able to stand 2 x while author assisted her with hygiene care. On 3rd STS pt took several steps form FOB to [facility]. She states she is too fatigued to continue session and was repositioned in bed. PT continues to recommend DC to SNF to address deficits while maximizing independence with ADLs.    Recommendations for follow up therapy are one component of a multi-disciplinary discharge planning process, led by the attending physician.  Recommendations may be updated based on patient status, additional functional criteria and insurance authorization.  Follow Up Recommendations  Skilled nursing-short term rehab (<3 hours/day)     Assistance Recommended at Discharge Frequent or constant Supervision/Assistance  Equipment Recommendations  Other (comment) (defer to next level of care)       Precautions / Restrictions Precautions Precautions: Fall Precaution Comments: cardiac/respiratory response to activity Restrictions Weight Bearing Restrictions: No     Mobility  Bed Mobility Overal bed mobility: Needs Assistance Bed Mobility: Rolling;Sidelying to Sit;Supine to Sit;Sit to  Supine Rolling: Min assist Sidelying to sit: Min assist Supine to sit: Min assist Sit to supine: Min assist   General bed mobility comments: Chief Strategy Officer questions pt's overall effort throughout session. she is able to perform all desired task with min assist + increased time due to poor activity tolerance. HR/RR/sao2 all stable throughout. pt tends to look at her personal pulse oximeter however tends to be lower reading than cardiac monitoring.    Transfers Overall transfer level: Needs assistance Equipment used: Rolling walker (2 wheels) Transfers: Sit to/from Stand Sit to Stand: Mod assist;Min assist           General transfer comment: Again, author questions pt's effort however she did stand 2 x at EOB. min assist from elevated surface then mod assist on 2nd attempt 2/2 to pt endorsing fatigue. she did take several steps from FOB to [facility] with CGA. Author feels pt can do much more than she is willing to try.    Ambulation/Gait Ambulation/Gait assistance: Min guard Gait Distance (Feet): 3 Feet Assistive device: Rolling walker (2 wheels) Gait Pattern/deviations: Step-to pattern Gait velocity: decreased     General Gait Details: pt took several steps form FOB to [facility] with CGA  only for safety. Vcs throughout for encouragement to attempt further ambulation.     Balance Overall balance assessment: Needs assistance Sitting-balance support: Feet supported;Bilateral upper extremity supported Sitting balance-Leahy Scale: Good Sitting balance - Comments: pt sat EOB x ~ 15 minutes throughout session. SBA throughout   Standing balance support: Bilateral upper extremity supported;During functional activity Standing balance-Leahy Scale: Fair Standing balance comment: able to static stand ~  30 sec 2 x while author assisted with hygene care/ changing pt's underwear         Cognition Arousal/Alertness: Awake/alert Behavior During Therapy: Agitated Overall Cognitive Status: Within Functional  Limits for tasks assessed      General Comments: Pt is alert and oriented however very frustrated with situation. easily agitated with minimal conversation. was willing to participate with encouragement and speaking direct/short to her.               Pertinent Vitals/Pain Pain Assessment: No/denies pain (" I just feel like a brick is on my lungs.")     PT Goals (current goals can now be found in the care plan section) Acute Rehab PT Goals Patient Stated Goal: " I cant do much." no personal goals stated PT Goal Formulation: With patient/family Progress towards PT goals: Progressing toward goals (pt is self limiting)    Frequency    Min 2X/week      PT Plan Current plan remains appropriate    Co-evaluation     PT goals addressed during session: Mobility/safety with mobility;Balance;Strengthening/ROM        AM-PAC PT "6 Clicks" Mobility   Outcome Measure  Help needed turning from your back to your side while in a flat bed without using bedrails?: A Little Help needed moving from lying on your back to sitting on the side of a flat bed without using bedrails?: A Little Help needed moving to and from a bed to a chair (including a wheelchair)?: A Little Help needed standing up from a chair using your arms (e.g., wheelchair or bedside chair)?: A Little Help needed to walk in hospital room?: A Lot Help needed climbing 3-5 steps with a railing? : A Lot 6 Click Score: 16    End of Session Equipment Utilized During Treatment: Oxygen (HHFNC) Activity Tolerance: Patient tolerated treatment well;Treatment limited secondary to agitation Patient left: in bed;with call bell/phone within reach;with bed alarm set;with family/visitor present Nurse Communication: Mobility status PT Visit Diagnosis: Other abnormalities of gait and mobility (R26.89);Muscle weakness (generalized) (M62.81)     Time: 1410-1435 PT Time Calculation (min) (ACUTE ONLY): 25 min  Charges:  $Therapeutic  Activity: 23-37 mins                     Julaine Fusi PTA 11/15/21, 4:15 PM

## 2021-11-15 NOTE — Progress Notes (Signed)
Pt converted to afib 90s-100s from NSR. Pt is currently on po Cardizem and Eliquis. Oncall NP made aware and no new orders.

## 2021-11-15 NOTE — Progress Notes (Addendum)
Progress Note  Patient Name: Margaret Stevenson Date of Encounter: 11/15/2021  [facility] Cardiologist: Kate Sable, MD   Subjective   HR elevated in Afib s/p PRN dilt, now in NSR. Patient on CPAP. No chest pain or SOB.   Inpatient Medications    Scheduled Meds:  apixaban  5 mg Oral BID   budesonide (PULMICORT) nebulizer solution  0.5 mg Nebulization BID   diltiazem  60 mg Oral TID   fluticasone  2 spray Each Nare Daily   guaiFENesin  1,200 mg Oral BID   ipratropium  0.5 mg Nebulization Q6H   levalbuterol  1.25 mg Nebulization Q6H   methylPREDNISolone (SOLU-MEDROL) injection  40 mg Intravenous Q24H   multivitamin with minerals  1 tablet Oral Daily   pravastatin  40 mg Oral q1800   Continuous Infusions:  PRN Meds: ALPRAZolam, bisacodyl, bisacodyl, levalbuterol, morphine injection, naproxen, polyethylene glycol, senna-docusate, sodium chloride, sodium chloride flush   Vital Signs    Vitals:   11/15/21 0900 11/15/21 0923 11/15/21 0945 11/15/21 1000  BP: (!) 130/95   (!) 141/61  Pulse: 76 85  86  Resp: (!) 27 20  17   Temp:      TempSrc:      SpO2: 92% 90% 92% 94%    Intake/Output Summary (Last 24 hours) at 11/15/2021 1026 Last data filed at 11/15/2021 0900 Gross per 24 hour  Intake 789.74 ml  Output 400 ml  Net 389.74 ml   Last 3 Weights 11/03/2021 04/06/2020 01/26/2018  Weight (lbs) 130 lb 130 lb 137 lb 12.6 oz  Weight (kg) 58.968 kg 58.968 kg 62.5 kg      Telemetry    Afib>NSR HR 90s - Personally Reviewed  ECG    No new - Personally Reviewed  Physical Exam   GEN: No acute distress.   Neck: No JVD Cardiac: RRR, no murmurs, rubs, or gallops.  Respiratory: Clear to auscultation bilaterally. GI: Soft, nontender, non-distended  MS: No edema; No deformity. Neuro:  Nonfocal  Psych: Normal affect   Labs    High Sensitivity Troponin:   Recent Labs  Lab 11/03/21 0252 11/03/21 0536 11/05/21 2057 11/06/21 0204  TROPONINIHS 5 5 8 6       Chemistry Recent Labs  Lab 11/09/21 0540 11/10/21 0519 11/12/21 0409 11/13/21 0415 11/14/21 0350  NA 139   < > 138 140 137  K 4.7   < > 4.4 4.4 4.0  CL 93*   < > 91* 92* 94*  CO2 43*   < > 40* 41* 39*  GLUCOSE 103*   < > 143* 149* 133*  BUN 12   < > 19 26* 23  CREATININE 0.30*   < > 0.33* 0.34* 0.34*  CALCIUM 8.9   < > 8.6* 9.0 8.3*  MG 2.3  --  2.5*  --   --   GFRNONAA >60   < > >60 >60 >60  ANIONGAP 3*   < > 7 7 4*   < > = values in this interval not displayed.    Lipids No results for input(s): CHOL, TRIG, HDL, LABVLDL, LDLCALC, CHOLHDL in the last 168 hours.  Hematology Recent Labs  Lab 11/12/21 0409 11/13/21 0415 11/14/21 0350  WBC 15.9* 17.1* 19.4*  RBC 4.31 4.95 4.43  HGB 13.7 15.2* 14.0  HCT 43.5 50.0* 44.7  MCV 100.9* 101.0* 100.9*  MCH 31.8 30.7 31.6  MCHC 31.5 30.4 31.3  RDW 12.8 13.0 13.0  PLT 215 226 207   Thyroid  Recent Labs  Lab 11/12/21 0409  TSH 1.188    BNP Recent Labs  Lab 11/11/21 1032  BNP 96.4    DDimer No results for input(s): DDIMER in the last 168 hours.   Radiology    No results found.  Cardiac Studies   Echo 10/2021 1. Left ventricular ejection fraction, by estimation, is 55 to 60%. Left  ventricular ejection fraction by PLAX is 56 %. The left ventricle has  normal function. The left ventricle has no regional wall motion  abnormalities. Left ventricular diastolic  parameters are indeterminate.   2. Right ventricular systolic function is normal. The right ventricular  size is normal.   3. The mitral valve is normal in structure. No evidence of mitral valve  regurgitation.   4. The aortic valve was not well visualized. Aortic valve regurgitation  is not visualized.   5. The inferior vena cava is normal in size with greater than 50%  respiratory variability, suggesting right atrial pressure of 3 mmHg.   Patient Profile     75 y.o. female with  h/o history of hyperlipidemia, COPD presenting with shortness of breath,  diagnosed with COPD exacerbation being seen for A. fib RVR.  Assessment & Plan    Afib RVR - Now back in SR after IV dilt 10mg  - IV Cardizem >>oral cardizem - CHADSVASC at least 3 - Echo with preserved LVEF - continue Eliquis 5mg  for anticoagulation - may need AAA stay in SR, will discuss with MD  COPD exacerbation - Inhalers perIM  HLD - PTA pravachol  Coronary artery calcification - Noted on CT imaging - No chest pain - HS troponin negative x 4 - EF normal by echo  For questions or updates, please contact [facility] Please consult [email or web address] for contact info under        Signed, Tailey Top Ninfa Meeker, PA-C  11/15/2021, 10:26 AM

## 2021-11-15 NOTE — Progress Notes (Signed)
Palliative: Chart review completed.  Margaret Stevenson continues to need high flow oxygen.   Margaret Stevenson is lying quietly in bed.  She appears acutely/chronically ill and quite frail.  She greets me, making and somewhat keeping eye contact.  She is alert and oriented, able to make her basic needs known.  Her son, Margaret Stevenson, is at bedside.  She tells me that she feels about the same.    We talked about pulmonary consult and recommendations.  We talked about cardiology consult and recommendations.  Time for outcomes.  We talked about bowel regimen, orders adjusted.  Conference with pulmonologist, attending, bedside nursing staff, transition of care team related to patient condition, needs, goals of care, disposition.  Plan:   At this point continue to treat the treatable but no CPR or intubation.  Time for outcomes.  Still agreeable to short-term rehab if able.  33 minutes  Quinn Axe, NP Palliative medicine team Team phone [phone] Greater than 50% of this time was spent counseling and coordinating care related to the above assessment and plan.

## 2021-11-15 NOTE — Progress Notes (Signed)
Mobility Specialist - Progress Note   11/15/21 1048  Mobility  Activity Contraindicated/medical hold  Mobility performed by Mobility specialist    Per discussion with RN, hold mobility this date d/t pt converting to Afib this AM with elevated HR. Will attempt session another date/time as appropriate.    Kathee Delton Mobility Specialist 11/15/21, 10:49 AM

## 2021-11-15 NOTE — Progress Notes (Addendum)
Progress Note    Margaret Stevenson  GEX:[id number] DOB: 11/17/1946  DOA: 11/05/2021 PCP: Dion Body, MD      Brief Narrative:    Medical records reviewed and are as summarized below:  Margaret Stevenson is a 75 y.o. female with medical history significant for COPD, chronic hypoxemic respiratory failure, pulmonary nodules, history of melanoma, who presented to the hospital because of difficulty breathing.      Assessment/Plan:   Principal Problem:   COPD with acute exacerbation (HCC) Active Problems:   Tobacco dependence   Acute on chronic respiratory failure with hypoxia (HCC)   Paroxysmal atrial fibrillation (HCC)   Malnutrition of moderate degree   Hypokalemia   Nutrition Problem: Moderate Malnutrition Etiology: chronic illness (COPD)  Signs/Symptoms: mild fat depletion, moderate fat depletion, mild muscle depletion, moderate muscle depletion    Acute on chronic hypoxemic respiratory failure: She is on oxygen via heated humidified HFNC at 65% at 45 L/min.  Difficult to taper down oxygen.    COPD exacerbation: Continue IV steroids and bronchodilators.  Continue chest PT with vest.  Follow-up with pulmonologist.  Completed 5 days of azithromycin.  Leukocytosis: This is likely from steroids.  Paroxysmal atrial fibrillation with RVR: It went up into the 160s this morning.  IV Cardizem 15 mg bolus x1 dose was ordered and by the time I saw her she had converted to normal sinus rhythm.  Continue oral Cardizem and Eliquis.  Follow-up with cardiology for further recommendations.  Hypokalemia: Improved  Pulmonary nodules: PET scan in April 2022 suggestive of benign etiology.  Outpatient follow-up with CT scan for continued surveillance recommended.  Tobacco use disorder: Counseled to quit smoking.  Discussed goals of care with the patient and her son at the bedside.  Patient is hoping that she will get better and be able to do basic things such as walking to  the bathroom.  Unfortunately, activity is limited by shortness of breath with minimal exertion .  Follow-up with palliative care team  Diet Order             Diet regular Room service appropriate? Yes; Fluid consistency: Thin  Diet effective now                      Consultants: Pulmonologist Intensivist Palliative care  Procedures: None    Medications:    apixaban  5 mg Oral BID   budesonide (PULMICORT) nebulizer solution  0.5 mg Nebulization BID   diltiazem  60 mg Oral TID   fluticasone  2 spray Each Nare Daily   guaiFENesin  1,200 mg Oral BID   ipratropium  0.5 mg Nebulization Q6H   levalbuterol  1.25 mg Nebulization Q6H   methylPREDNISolone (SOLU-MEDROL) injection  40 mg Intravenous Q24H   multivitamin with minerals  1 tablet Oral Daily   pravastatin  40 mg Oral q1800   Continuous Infusions:   Anti-infectives (From admission, onward)    Start     Dose/Rate Route Frequency Ordered Stop   11/07/21 2000  azithromycin (ZITHROMAX) 500 mg in sodium chloride 0.9 % 250 mL IVPB        500 mg 250 mL/hr over 60 Minutes Intravenous Every 24 hours 11/07/21 1926 11/11/21 2105              Family Communication/Anticipated D/C date and plan/Code Status   DVT prophylaxis:  apixaban (ELIQUIS) tablet 5 mg     Code Status: DNR  Family Communication: Barnabas Lister, son,  at the bedside Disposition Plan: Plan to discharge to SNF   Status is: Inpatient  Remains inpatient appropriate because: Hypoxemic on oxygen via heated humidified HFNC           Subjective:   Interval events noted.  Heart rate went up into the 160s this morning.  C/o shortness of breath.  Respiratory therapist and patient's son were at the bedside.     Objective:    Vitals:   11/15/21 1000 11/15/21 1100 11/15/21 1200 11/15/21 1300  BP: (!) 141/61 (!) 113/51 (!) 131/55 (!) 128/57  Pulse: 86 84 77 82  Resp: 17 19 18 18   Temp:      TempSrc:      SpO2: 94% 94% 91% 91%   No data  found.   Intake/Output Summary (Last 24 hours) at 11/15/2021 1333 Last data filed at 11/15/2021 0900 Gross per 24 hour  Intake 760 ml  Output 400 ml  Net 360 ml   There were no vitals filed for this visit.  Exam:  GEN: NAD SKIN: Warm and dry EYES: EOMI ENT: MMM CV: RRR PULM: She is on oxygen via heated humidified HFNC.  Decreased air entry bilaterally.  No wheezing or rales abdomen ABD: soft, ND, NT, +BS CNS: AAO x 3, non focal EXT: No edema or tenderness         Data Reviewed:   I have personally reviewed following labs and imaging studies:  Labs: Labs show the following:   Basic Metabolic Panel: Recent Labs  Lab 11/09/21 0540 11/10/21 0519 11/11/21 0551 11/12/21 0409 11/13/21 0415 11/14/21 0350  NA 139 136 138 138 140 137  K 4.7 5.3* 4.8 4.4 4.4 4.0  CL 93* 90* 90* 91* 92* 94*  CO2 43* 39* 43* 40* 41* 39*  GLUCOSE 103* 140* 149* 143* 149* 133*  BUN 12 13 16 19  26* 23  CREATININE 0.30* 0.32* 0.34* 0.33* 0.34* 0.34*  CALCIUM 8.9 8.6* 9.1 8.6* 9.0 8.3*  MG 2.3  --   --  2.5*  --   --   PHOS  --   --   --  4.7*  --   --    GFR Estimated Creatinine Clearance: 57.5 mL/min (A) (by C-G formula based on SCr of 0.34 mg/dL (L)). Liver Function Tests: No results for input(s): AST, ALT, ALKPHOS, BILITOT, PROT, ALBUMIN in the last 168 hours. No results for input(s): LIPASE, AMYLASE in the last 168 hours. No results for input(s): AMMONIA in the last 168 hours. Coagulation profile No results for input(s): INR, PROTIME in the last 168 hours.  CBC: Recent Labs  Lab 11/09/21 0540 11/12/21 0409 11/13/21 0415 11/14/21 0350  WBC 10.2 15.9* 17.1* 19.4*  NEUTROABS  --  14.8*  --   --   HGB 14.2 13.7 15.2* 14.0  HCT 47.3* 43.5 50.0* 44.7  MCV 103.1* 100.9* 101.0* 100.9*  PLT 194 215 226 207   Cardiac Enzymes: No results for input(s): CKTOTAL, CKMB, CKMBINDEX, TROPONINI in the last 168 hours. BNP (last 3 results) No results for input(s): PROBNP in the last  8760 hours. CBG: No results for input(s): GLUCAP in the last 168 hours. D-Dimer: No results for input(s): DDIMER in the last 72 hours. Hgb A1c: No results for input(s): HGBA1C in the last 72 hours. Lipid Profile: No results for input(s): CHOL, HDL, LDLCALC, TRIG, CHOLHDL, LDLDIRECT in the last 72 hours. Thyroid function studies: No results for input(s): TSH, T4TOTAL, T3FREE, THYROIDAB in the last 72 hours.  Invalid  input(s): FREET3  Anemia work up: No results for input(s): VITAMINB12, FOLATE, FERRITIN, TIBC, IRON, RETICCTPCT in the last 72 hours. Sepsis Labs: Recent Labs  Lab 11/09/21 0540 11/10/21 0519 11/11/21 0551 11/12/21 0409 11/13/21 0415 11/14/21 0350  PROCALCITON  --  <0.10 <0.10  --   --   --   WBC 10.2  --   --  15.9* 17.1* 19.4*    Microbiology Recent Results (from the past 240 hour(s))  Resp Panel by RT-PCR (Flu A&B, Covid) Nasopharyngeal Swab     Status: None   Collection Time: 11/05/21 10:02 PM   Specimen: Nasopharyngeal Swab; Nasopharyngeal(NP) swabs in vial transport medium  Result Value Ref Range Status   SARS Coronavirus 2 by RT PCR NEGATIVE NEGATIVE Final    Comment: (NOTE) SARS-CoV-2 target nucleic acids are NOT DETECTED.  The SARS-CoV-2 RNA is generally detectable in upper respiratory specimens during the acute phase of infection. The lowest concentration of SARS-CoV-2 viral copies this assay can detect is 138 copies/mL. A negative result does not preclude SARS-Cov-2 infection and should not be used as the sole basis for treatment or other patient management decisions. A negative result may occur with  improper specimen collection/handling, submission of specimen other than nasopharyngeal swab, presence of viral mutation(s) within the areas targeted by this assay, and inadequate number of viral copies(<138 copies/mL). A negative result must be combined with clinical observations, patient history, and epidemiological information. The expected  result is Negative.  Fact Sheet for Patients:  EntrepreneurPulse.com.au  Fact Sheet for Healthcare Providers:  IncredibleEmployment.be  This test is no t yet approved or cleared by the [place] FDA and  has been authorized for detection and/or diagnosis of SARS-CoV-2 by FDA under an Emergency Use Authorization (EUA). This EUA will remain  in effect (meaning this test can be used) for the duration of the COVID-19 declaration under Section 564(b)(1) of the Act, 21 U.S.C.section 360bbb-3(b)(1), unless the authorization is terminated  or revoked sooner.       Influenza A by PCR NEGATIVE NEGATIVE Final   Influenza B by PCR NEGATIVE NEGATIVE Final    Comment: (NOTE) The Xpert Xpress SARS-CoV-2/FLU/RSV plus assay is intended as an aid in the diagnosis of influenza from Nasopharyngeal swab specimens and should not be used as a sole basis for treatment. Nasal washings and aspirates are unacceptable for Xpert Xpress SARS-CoV-2/FLU/RSV testing.  Fact Sheet for Patients: EntrepreneurPulse.com.au  Fact Sheet for Healthcare Providers: IncredibleEmployment.be  This test is not yet approved or cleared by the [place] FDA and has been authorized for detection and/or diagnosis of SARS-CoV-2 by FDA under an Emergency Use Authorization (EUA). This EUA will remain in effect (meaning this test can be used) for the duration of the COVID-19 declaration under Section 564(b)(1) of the Act, 21 U.S.C. section 360bbb-3(b)(1), unless the authorization is terminated or revoked.  Performed at [facility], [address] [place], [place] [address]     Procedures and diagnostic studies:  No results found.             LOS: 10 days   [place]  [facility]   Pager on [email or web address]. If 7PM-7AM, please contact night-coverage at [email or web address]     11/15/2021, 1:33 PM

## 2021-11-15 NOTE — Progress Notes (Addendum)
Attempted disimpaction, No stool in the rectum. SMOG enema given with difficulties. Patient unable to position with head flat. Unable to retain enema. No BM with Enema.

## 2021-11-15 NOTE — TOC Progression Note (Signed)
Transition of Care [facility]) - Progression Note    Patient Details  Name: Margaret Stevenson MRN: [id number] Date of Birth: 05-19-1946  Transition of Care [facility] CM/SW [place], [place] Phone Number: 11/15/2021, 3:05 PM  Clinical Narrative:     CSW notes patient has both bed offers from [facility] and [facility], insurance auth will need to be started once patient nears medical readiness and bed choice made.   CSW notes patient currently with worsening respiratory distress in the setting of atrial fibrillation with RVR, on HFNC.   TOC will continue to follow.   Expected Discharge Plan: [place] Barriers to Discharge: Continued Medical Work up  Expected Discharge Plan and Services Expected Discharge Plan: [place] arrangements for the past 2 months: Single Family Home                                       Social Determinants of Health (SDOH) Interventions    Readmission Risk Interventions No flowsheet data found.

## 2021-11-15 NOTE — Progress Notes (Signed)
Progress Note  Patient Name: Margaret Stevenson Date of Encounter: 11/15/2021  [facility] Cardiologist: Kate Sable, MD   Subjective   Last seen by myself 1 week ago, appears the same or worse On high flow oxygen, thick bronchitic cough Continues to have long episodes of paroxysmal atrial fibrillation with RVR not controlled on diltiazem requiring conversion back to diltiazem infusion and boluses of diltiazem IV Worsening respiratory distress in the setting of atrial fibrillation with RVR Son remains at the bedside  Review of telemetry showing rapid atrial fibrillation midnight to 9:30 AM  Inpatient Medications    Scheduled Meds:  apixaban  5 mg Oral BID   budesonide (PULMICORT) nebulizer solution  0.5 mg Nebulization BID   diltiazem  60 mg Oral TID   fluticasone  2 spray Each Nare Daily   guaiFENesin  1,200 mg Oral BID   ipratropium  0.5 mg Nebulization Q6H   levalbuterol  1.25 mg Nebulization Q6H   methylPREDNISolone (SOLU-MEDROL) injection  40 mg Intravenous Q24H   multivitamin with minerals  1 tablet Oral Daily   pravastatin  40 mg Oral q1800   Continuous Infusions:   PRN Meds: ALPRAZolam, bisacodyl, bisacodyl, levalbuterol, morphine injection, naproxen, polyethylene glycol, senna-docusate, sodium chloride, sodium chloride flush   Vital Signs    Vitals:   11/15/21 1000 11/15/21 1100 11/15/21 1200 11/15/21 1300  BP: (!) 141/61 (!) 113/51 (!) 131/55 (!) 128/57  Pulse: 86 84 77 82  Resp: 17 19 18 18   Temp:      TempSrc:      SpO2: 94% 94% 91% 91%    Intake/Output Summary (Last 24 hours) at 11/15/2021 1426 Last data filed at 11/15/2021 0900 Gross per 24 hour  Intake 400 ml  Output 400 ml  Net 0 ml   Last 3 Weights 11/03/2021 04/06/2020 01/26/2018  Weight (lbs) 130 lb 130 lb 137 lb 12.6 oz  Weight (kg) 58.968 kg 58.968 kg 62.5 kg      Telemetry    Normal sinus rhythm rate 60 to 70 bpm converting back to normal sinus rhythm 9:30 AM  personally  Reviewed  ECG     - Personally Reviewed  Physical Exam  Constitutional: Very thin, ill-appearing respiratory distress HENT:  Head: Grossly normal Eyes:  no discharge. No scleral icterus.  Neck: Unable to estimate JVD, no carotid bruits  Cardiovascular: Regular rate and rhythm, no murmurs appreciated Pulmonary/Chest: Coarse breath sounds bilaterally Abdominal: Soft.  no distension.  no tenderness.  Musculoskeletal: Normal range of motion, atrophy Neurological:  normal muscle tone. Coordination normal. No atrophy Skin: Skin warm and dry Psychiatric: normal affect, pleasant   Labs    High Sensitivity Troponin:   Recent Labs  Lab 11/03/21 0252 11/03/21 0536 11/05/21 2057 11/06/21 0204  TROPONINIHS 5 5 8 6      Chemistry Recent Labs  Lab 11/09/21 0540 11/10/21 0519 11/12/21 0409 11/13/21 0415 11/14/21 0350  NA 139   < > 138 140 137  K 4.7   < > 4.4 4.4 4.0  CL 93*   < > 91* 92* 94*  CO2 43*   < > 40* 41* 39*  GLUCOSE 103*   < > 143* 149* 133*  BUN 12   < > 19 26* 23  CREATININE 0.30*   < > 0.33* 0.34* 0.34*  CALCIUM 8.9   < > 8.6* 9.0 8.3*  MG 2.3  --  2.5*  --   --   GFRNONAA >60   < > >60 >  60 >60  ANIONGAP 3*   < > 7 7 4*   < > = values in this interval not displayed.    Lipids No results for input(s): CHOL, TRIG, HDL, LABVLDL, LDLCALC, CHOLHDL in the last 168 hours.  Hematology Recent Labs  Lab 11/12/21 0409 11/13/21 0415 11/14/21 0350  WBC 15.9* 17.1* 19.4*  RBC 4.31 4.95 4.43  HGB 13.7 15.2* 14.0  HCT 43.5 50.0* 44.7  MCV 100.9* 101.0* 100.9*  MCH 31.8 30.7 31.6  MCHC 31.5 30.4 31.3  RDW 12.8 13.0 13.0  PLT 215 226 207   Thyroid  Recent Labs  Lab 11/12/21 0409  TSH 1.188    BNP Recent Labs  Lab 11/11/21 1032  BNP 96.4    DDimer No results for input(s): DDIMER in the last 168 hours.   Radiology    No results found.  Cardiac Studies     Patient Profile     75 y.o. female w/ a h/o COPD and tob abuse, who was admitted 11/28 w/  resp failure and COPD w/ finding of PAF.  Assessment & Plan    COPD exacerbation/bronchitis  acute on chronic respiratory failure, emphysema on imaging On 2 to 3 L at home, has been requiring 6 L on admission, up to high flow If patient wants to stay full code would continue steroids, nebs, broad-spectrum antibiotics for bronchitis with COPD exacerbation    Paroxysmal atrial fibrillation  continued episodes of A. fibrillation with RVR lasting 9 to 10 hours in the past 24 hours -- Diltiazem both infusion and oral dosing not holding normal sinus rhythm, Given respiratory distress when she is in atrial fibrillation with RVR lasting for hours at a time on top of her severe COPD and given discussions concerning hospice/palliative care, will start amiodarone --- This is not an ideal choice of medications given underlying lung disease but little choice given her situation is critical, unable to control normal sinus rhythm, arrhythmia causing severe respiratory distress and high risk of intubation --We will start amiodarone oral dosing 400 twice daily  Coronary artery disease with stable angina Coronary disease noted on chest CT Normal ejection fraction on echo On Eliquis, statin No plan for ischemic work-up at this time   Smoker Smoking cessation recommended, has severe acute on chronic COPD Followed by pulmonary  Case discussed with PT, son at the bedside, and with patient in detail Discussed with hospitalist service  Total encounter time more than 35 minutes  Greater than 50% was spent in counseling and coordination of care with the patient   For questions or updates, please contact [facility] Please consult [email or web address] for contact info under        Signed, Ida Rogue, MD  11/15/2021, 2:26 PM

## 2021-11-15 NOTE — Progress Notes (Signed)
HR 130-160s Patient oriented x4 BP 130/90. MD Paged. Dr Mal Misty responded with order for diltiazem. Dicussed with cardiology as well   11/15/21 0900  Assess: MEWS Score  BP (!) 130/95  Pulse Rate 76  ECG Heart Rate (!) 149  Resp (!) 27  Level of Consciousness Alert  SpO2 92 %  O2 Device HFNC  Assess: MEWS Score  MEWS Temp 0  MEWS Systolic 0  MEWS Pulse 3  MEWS RR 2  MEWS LOC 0  MEWS Score 5  MEWS Score Color Red  Assess: if the MEWS score is Yellow or Red  Were vital signs taken at a resting state? Yes  Focused Assessment Change from prior assessment (see assessment flowsheet)  Does the patient meet 2 or more of the SIRS criteria? No  MEWS guidelines implemented *See Row Information* Yes  Treat  MEWS Interventions Administered scheduled meds/treatments;Administered prn meds/treatments (pain med given at [phone])  Pain Scale 0-10  Pain Score 8  Pain Type Acute pain  Pain Location Generalized  Pain Radiating Towards head  Pain Intervention(s) Medication (See eMAR)  Take Vital Signs  Increase Vital Sign Frequency  Red: Q 1hr X 4 then Q 4hr X 4, if remains red, continue Q 4hrs  Escalate  MEWS: Escalate Red: discuss with charge nurse/RN and provider, consider discussing with RRT  Notify: Charge Nurse/RN  Name of Charge Nurse/RN Notified Casey Burkitt  Date Charge Nurse/RN Notified 11/15/21  Time Charge Nurse/RN Notified 0957  Notify: Provider  Provider Name/Title Dr Mal Misty (discussed with cardiology as well)  Date Provider Notified 11/15/21  Time Provider Notified 0911  Notification Type Page  Notification Reason Change in status;Other (Comment) (Afibn with RVR)  Provider response In department  Date of Provider Response 11/15/21  Time of Provider Response 0925 (PRN Diltiazem ordered and given)  Document  Patient Outcome Stabilized after interventions  Assess: SIRS CRITERIA  SIRS Temperature  0  SIRS Pulse 1  SIRS Respirations  1  SIRS WBC 0  SIRS Score Sum  2

## 2021-11-15 NOTE — Consult Note (Signed)
NAME:  Margaret Stevenson, MRN:  [id number], DOB:  1946/02/01, LOS: 83 ADMISSION DATE:  11/05/2021, CONSULTATION DATE: 11/11/2021 REFERRING MD: Irine Seal, MD, CHIEF COMPLAINT: Persistent hypoxia  Brief Patient Report  75 y.o. female w/ a h/o COPD and tob abuse, who was admitted 11/28 w/ resp failure and COPD w/ finding of PAF. HPI  75 year old female with past medical history of COPD, previous melanoma and pulmonary nodules who was admitted on 11/05/2021 with difficulty breathing.  CT scan of the chest on 11/03/2021 after an ER visit that was negative for pulmonary embolism and showed stable pulmonary nodules. She presented back to the ED with worsening respiratory symptoms.  Hospital Course: During the course of admission patient developed episode of SVT with rates as high as 180s.  Valsalva maneuver and carotid massage performed with improvement.  Adenosine was considered but rhythm improved prior to being administered.  Patient later given IV Cardizem push with improvement.  Cardiology consulted patient started on Eliquis and p.o. Cardizem 30 mg every 6 hours with plans to switch to CD before DC.  Patient continued to require increased oxygenation from 89 L HFNC O2.  Weaning attempts was made to 6 L but patient desatted to low 80s at rest.  This morning patient continues to complain of shortness of breath states she is satting in the 90s but feels like she is satting in the 80s.  Due to increased oxygen requirement with persistent hypoxia PCCM consulted to assist with management.  11/12/21- family including son is at bedside, patient on HFNC 54/50.  Patient is slow to answer but lucid. 11/13/21 - Patient is continuing to require HFNC she has improved to 49/50.  She is overall with poor prognosis. We met together with palliative care.  We discussed hospice.  12.8/22- patient remains on HFNC. She is hopeful to improve. She is followed by palliative which sees reasonable.   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   Elevated CA 19-9 level 05/08/2017   Arthritis 05/07/2017   Asthma without status asthmaticus 05/07/2017   Borderline diabetes 05/07/2017   Hx of adenomatous colonic polyps 05/07/2017   Tobacco dependence 05/07/2017   Sepsis ([place]) 04/27/2017   Disease of biliary tract     Obstruction of bile duct     Jaundice     Hypoxia 10/11/2016   Anxiety 10/11/2016   COPD (chronic obstructive pulmonary disease) ([place]) 10/06/2016   CAP (community acquired pneumonia) 10/06/2016   Hyperglycemia 10/06/2016   Vaccine counseling 06/21/2016   History of malignant melanoma of skin 08/03/2013    Significant Hospital Events   11/28: Admitted to progressive care unit with acute on chronic respiratory failure secondary to COPD exacerbation/anxiety 11/30: Continue to wean oxygen to 5 L/min. Cardiology consulted for Afib with RVR 12/1: Patient with increased oxygen requirement 8 to 9 L HFNC failed weaning. 12/2: Remains on high flow nasal cannula at 8 to 9 L to keep sats greater than 90% 12/3: Still requiring high oxygen also in A. fib with RVR 12/4:  PCCM consulted  Consults:  Cardiology PCCM  Procedures:  None  Significant Diagnostic Tests:  11/28: Chest Xray> no acute cardiopulmonary process, emphysema 12/4: 7 mm right upper lobe pulmonary nodule, better demonstrated on recent chest CT. 2. Peribronchial opacities with lower lobe predominance may represent bronchitic changes or interstitial pulmonary edema. 3. Emphysema  Micro Data:  11/28: SARS-CoV-2 PCR> negative 11/28: Influenza PCR>  negative  Antimicrobials:  None  OBJECTIVE  Blood pressure 120/71, pulse 84, temperature 98.1 F (36.7 C), temperature source Oral, resp. rate 16, SpO2 94 %.    FiO2 (%):  [64 %-67 %] 65 %   Intake/Output Summary (Last 24 hours) at 11/15/2021 0748 Last data filed at 11/15/2021 0700 Gross per 24 hour  Intake 1109.74 ml  Output 800 ml  Net 309.74 ml    There were no vitals filed for this visit.  Physical Examination  GENERAL: 75 year-old patient lying in the bed with no acute distress.  EYES: Pupils equal, round, reactive to light and accommodation. No scleral icterus. Extraocular muscles intact.  HEENT: Head atraumatic, normocephalic. Oropharynx and nasopharynx clear.  NECK:  Supple, no jugular venous distention. No thyroid enlargement, no tenderness.  LUNGS: Decreased breath sounds bilaterally, no wheezing, rales,rhonchi or crepitation. No use of accessory muscles of respiration.  CARDIOVASCULAR: S1, S2 normal. No murmurs, rubs, or gallops.  ABDOMEN: Soft, nontender, nondistended. Bowel sounds present. No organomegaly or mass.  EXTREMITIES: No pedal edema, cyanosis, or clubbing.  NEUROLOGIC: Cranial nerves II through XII are intact.  Muscle strength 5/5 in all extremities. Sensation intact. Gait not checked.  PSYCHIATRIC: The patient is alert and oriented x 3.  SKIN: No obvious rash, lesion, or ulcer.   Labs/imaging that I havepersonally reviewed  (right click and "Reselect all SmartList Selections" daily)     Labs   CBC: Recent Labs  Lab 11/09/21 0540 11/12/21 0409 11/13/21 0415 11/14/21 0350  WBC 10.2 15.9* 17.1* 19.4*  NEUTROABS  --  14.8*  --   --   HGB 14.2 13.7 15.2* 14.0  HCT 47.3* 43.5 50.0* 44.7  MCV 103.1* 100.9* 101.0* 100.9*  PLT 194 215 226 207     Basic Metabolic Panel: Recent Labs  Lab 11/09/21 0540 11/10/21 0519 11/11/21 0551 11/12/21 0409 11/13/21 0415 11/14/21 0350  NA 139 136 138 138 140 137  K 4.7 5.3* 4.8 4.4 4.4 4.0  CL 93* 90* 90*  91* 92* 94*  CO2 43* 39* 43* 40* 41* 39*  GLUCOSE 103* 140* 149* 143* 149* 133*  BUN _0 26* 23  CREATININE 0.30* 0.32* 0.34* 0.33* 0.34* 0.34*  CALCIUM 8.9 8.6* 9.1 8.6* 9.0 8.3*  MG 2.3  --   --  2.5*  --   --   PHOS  --   --   --  4.7*  --   --     GFR: Estimated Creatinine Clearance: 57.5 mL/min (A) (by C-G formula based on SCr of 0.34 mg/dL (L)). Recent Labs  Lab 11/09/21 0540 11/10/21 0519 11/11/21 0551 11/12/21 0409 11/13/21 0415 11/14/21 0350  PROCALCITON  --  <0.10 <0.10  --   --   --   WBC 10.2  --   --  15.9* 17.1* 19.4*     Liver Function Tests: No results for input(s): AST, ALT, ALKPHOS, BILITOT, PROT, ALBUMIN in the last 168 hours. No results for input(s): LIPASE, AMYLASE in the last 168 hours. No results for input(s): AMMONIA in the last 168 hours.  ABG    Component Value  Date/Time   PHART 7.44 11/12/2021 0847   PCO2ART 68 (HH) 11/12/2021 0847   PO2ART 53 (L) 11/12/2021 0847   HCO3 46.2 (H) 11/12/2021 0847   O2SAT 88.3 11/12/2021 0847     Coagulation Profile: No results for input(s): INR, PROTIME in the last 168 hours.  Cardiac Enzymes: No results for input(s): CKTOTAL, CKMB, CKMBINDEX, TROPONINI in the last 168 hours.  HbA1C: Hgb A1c MFr Bld  Date/Time Value Ref Range Status  04/27/2017 12:17 AM 4.5 (L) 4.8 - 5.6 % Final    Comment:    (NOTE)         Pre-diabetes: 5.7 - 6.4         Diabetes: >6.4         Glycemic control for adults with diabetes: <7.0     CBG: No results for input(s): GLUCAP in the last 168 hours.  Review of Systems:   Review of Systems  Constitutional: Negative.   HENT: Negative.    Eyes: Negative.   Respiratory:  Positive for cough, sputum production, shortness of breath and wheezing. Negative for hemoptysis.   Cardiovascular:  Positive for palpitations, orthopnea and leg swelling. Negative for chest pain, claudication and PND.  Gastrointestinal: Negative.   Genitourinary: Negative.   Musculoskeletal:   Positive for back pain and neck pain.  Skin: Negative.   Neurological: Negative.   Endo/Heme/Allergies: Negative.   Psychiatric/Behavioral:  The patient is nervous/anxious.     Past Medical History  She,  has a past medical history of Arthritis, Cancer ([place]), COPD (chronic obstructive pulmonary disease) ([place]), Dyspnea, and Jaundice (04/2017).   Surgical History    Past Surgical History:  Procedure Laterality Date   BREAST BIOPSY Right 08/26/2018   Affirm Bx ("x" clip) path pending   BUNIONECTOMY     COLONOSCOPY     COLONOSCOPY WITH PROPOFOL N/A 05/26/2017   Procedure: COLONOSCOPY WITH PROPOFOL;  Surgeon: Lucilla Lame, MD;  Location: [facility];  Service: Endoscopy;  Laterality: N/A;  please do not move appt time   ERCP N/A 04/25/2017   Procedure: ENDOSCOPIC RETROGRADE CHOLANGIOPANCREATOGRAPHY (ERCP);  Surgeon: Lucilla Lame, MD;  Location: [facility] ENDOSCOPY;  Service: Endoscopy;  Laterality: N/A;   ERCP N/A 09/30/2017   Procedure: ENDOSCOPIC RETROGRADE CHOLANGIOPANCREATOGRAPHY (ERCP) Pancreatic stent removal;  Surgeon: Lucilla Lame, MD;  Location: [facility] ENDOSCOPY;  Service: Endoscopy;  Laterality: N/A;   POLYPECTOMY  05/26/2017   Procedure: POLYPECTOMY;  Surgeon: Lucilla Lame, MD;  Location: [facility];  Service: Endoscopy;;   TONSILLECTOMY       Social History   reports that she quit smoking 12 days ago. Her smoking use included cigarettes. She has a 28.50 pack-year smoking history. She has never used smokeless tobacco. She reports current alcohol use of about 7.0 standard drinks per week. She reports that she does not use drugs.   Family History   Her family history includes Breast cancer (age of onset: 62) in her mother.   Allergies Allergies  Allergen Reactions   Penicillins Hives    Has patient had a PCN reaction causing immediate rash, facial/tongue/throat swelling, SOB or lightheadedness with hypotension: no Has patient had a PCN reaction causing severe rash  involving mucus membranes or skin necrosis: no Has patient had a PCN reaction that required hospitalization no Has patient had a PCN reaction occurring within the last 10 years: no If all of the above answers are "NO", then may proceed with Cephalosporin use.      Home Medications  Prior to Admission  medications   Medication Sig Start Date End Date Taking? Authorizing Provider  albuterol (PROVENTIL) (2.5 MG/3ML) 0.083% nebulizer solution Take 3 mLs (2.5 mg total) by nebulization every 4 (four) hours as needed for wheezing or shortness of breath. 11/03/21 11/03/22  Naaman Plummer, MD  albuterol (VENTOLIN HFA) 108 (90 Base) MCG/ACT inhaler Inhale 2 puffs into the lungs every 4 (four) hours as needed for shortness of breath.  08/27/16 01/24/19  [provider]  aspirin EC 81 MG tablet Take 81 mg by mouth daily. 10/11/21   [provider]  Fluticasone-Salmeterol (ADVAIR) 250-50 MCG/DOSE AEPB Inhale 1 puff into the lungs 2 (two) times daily.    [provider]  lovastatin (MEVACOR) 40 MG tablet Take 40 mg by mouth daily with supper. 10/11/21   [provider]  OXYGEN Inhale 3 L into the lungs. 1 L when not active    [provider]  tiotropium (SPIRIVA) 18 MCG inhalation capsule Place 18 mcg into inhaler and inhale at bedtime.  08/27/16 01/24/19  [provider]  TURMERIC PO Take by mouth.    [provider]    Scheduled Meds:  apixaban  5 mg Oral BID   budesonide (PULMICORT) nebulizer solution  0.5 mg Nebulization BID   diltiazem  60 mg Oral TID   fluticasone  2 spray Each Nare Daily   guaiFENesin  1,200 mg Oral BID   ipratropium  0.5 mg Nebulization Q6H   levalbuterol  1.25 mg Nebulization Q6H   methylPREDNISolone (SOLU-MEDROL) injection  40 mg Intravenous Q24H   multivitamin with minerals  1 tablet Oral Daily   pravastatin  40 mg Oral q1800   Continuous Infusions:   PRN Meds:.ALPRAZolam, bisacodyl, bisacodyl, levalbuterol,  morphine injection, naproxen, polyethylene glycol, senna-docusate, sodium chloride, sodium chloride flush    Assessment & Plan:  Acute on chronic hypercapnic hypoxic respiratory failure secondary to AECOPD  PMHx: Asthma, COPD on Chronic home oxygen at 3L, current everyday smoker  -Supplemental oxygen as needed, maintain SpO2 > 88% -Intermittent chest x-ray & ABG PRN -Ensure adequate pulmonary hygiene -I have reduced steroids ot solumedrol 69m daily - no need for antibiotics -Palliative care and hospice recommendation today and reviewed end of life care options.  Paroxysmal atrial fibrillation Runs of SVT New since admission likely in the setting of AECOPD as above -Continue diltiazem 240 daily for rate control and as BP permits -Continue Eliquis 532mBID  CHA2DS2VASc  -If uncontrolled with above plan for AAD per cardio recommendations -Replace electrolytes,  Hyperglycemia likely steroid-induced -Check hemoglobin A1c -CBGs -Sliding scale insulin -Follow hyper/hypoglycemia protocol ` Best practice:  Diet:  Oral Pain/Anxiety/Delirium protocol (if indicated): No VAP protocol (if indicated): Not indicated DVT prophylaxis: Systemic AC GI prophylaxis: N/A Glucose control:  SSI Yes Central venous access:  N/A Arterial line:  N/A Foley:  N/A Mobility:  bed rest  PT consulted: Yes Last date of multidisciplinary goals of care discussion [12/4] Code Status:  DNR Disposition: Progressive Care Unit   = Goals of Care = Code Status Order: _0 @   Primary Emergency Contact: Chrys, [place] Phone: [phone] wishes to pursue ongoing treatment, but concurred that if deteriorated to pulselessness, patient would prefer a natural death as opposed to invasive measures such as CPR and intubation.      FuOttie GlazierM.D.  Pulmonary & CrNassau [place]

## 2021-11-16 ENCOUNTER — Inpatient Hospital Stay: Payer: Medicare Other

## 2021-11-16 DIAGNOSIS — J9621 Acute and chronic respiratory failure with hypoxia: Secondary | ICD-10-CM | POA: Diagnosis not present

## 2021-11-16 DIAGNOSIS — J441 Chronic obstructive pulmonary disease with (acute) exacerbation: Secondary | ICD-10-CM | POA: Diagnosis not present

## 2021-11-16 DIAGNOSIS — Z515 Encounter for palliative care: Secondary | ICD-10-CM | POA: Diagnosis not present

## 2021-11-16 DIAGNOSIS — J9811 Atelectasis: Secondary | ICD-10-CM

## 2021-11-16 DIAGNOSIS — J9601 Acute respiratory failure with hypoxia: Secondary | ICD-10-CM | POA: Diagnosis not present

## 2021-11-16 DIAGNOSIS — Z7189 Other specified counseling: Secondary | ICD-10-CM | POA: Diagnosis not present

## 2021-11-16 DIAGNOSIS — R0902 Hypoxemia: Secondary | ICD-10-CM

## 2021-11-16 MED ORDER — ALBUTEROL SULFATE (2.5 MG/3ML) 0.083% IN NEBU
2.5000 mg | INHALATION_SOLUTION | RESPIRATORY_TRACT | Status: DC | PRN
  Administered 2021-11-18: 2.5 mg via RESPIRATORY_TRACT
  Filled 2021-11-16: qty 3

## 2021-11-16 MED ORDER — IPRATROPIUM-ALBUTEROL 0.5-2.5 (3) MG/3ML IN SOLN
3.0000 mL | RESPIRATORY_TRACT | Status: DC
  Administered 2021-11-16 – 2021-11-17 (×3): 3 mL via RESPIRATORY_TRACT
  Filled 2021-11-16 (×3): qty 3

## 2021-11-16 MED ORDER — ALPRAZOLAM 0.5 MG PO TABS
0.5000 mg | ORAL_TABLET | Freq: Three times a day (TID) | ORAL | Status: DC | PRN
  Administered 2021-11-17 – 2021-11-27 (×14): 0.5 mg via ORAL
  Filled 2021-11-16 (×14): qty 1

## 2021-11-16 NOTE — Progress Notes (Signed)
Progress Note    Margaret Stevenson  NTZ:[id number] DOB: 1946-08-31  DOA: 11/05/2021 PCP: Dion Body, MD      Brief Narrative:    Medical records reviewed and are as summarized below:  Margaret Stevenson is a 75 y.o. female with medical history significant for COPD, chronic hypoxemic respiratory failure, pulmonary nodules, history of melanoma, who presented to the hospital because of difficulty breathing.      Assessment/Plan:   Principal Problem:   COPD with acute exacerbation (HCC) Active Problems:   Tobacco dependence   Acute on chronic respiratory failure with hypoxia (HCC)   Paroxysmal atrial fibrillation (HCC)   Malnutrition of moderate degree   Hypokalemia   Nutrition Problem: Moderate Malnutrition Etiology: chronic illness (COPD)  Signs/Symptoms: mild fat depletion, moderate fat depletion, mild muscle depletion, moderate muscle depletion    Acute on chronic hypoxemic respiratory failure: She is on oxygen via heated humidified HFNC at 65% at 45 L/min.  Difficult to taper down oxygen.    COPD exacerbation: Chest x-ray on 11/16/2021 showed increased interstitial markings.  Continue IV steroids and bronchodilators.  Continue chest PT with vest.  Follow-up with pulmonologist for further recommendations.  Completed 5 days of azithromycin.  Leukocytosis: This is likely from steroids.  Paroxysmal atrial fibrillation with RVR: She is in normal sinus rhythm.  Continue amiodarone, diltiazem and Eliquis per cardiologist.    Hypokalemia: Improved  Constipation: Continue laxatives.  Abdominal x-ray on 11/16/2021 showed large amount of stool in the right colon.  Pulmonary nodules: PET scan in April 2022 suggestive of benign etiology.  Outpatient follow-up with CT scan for continued surveillance recommended.  Tobacco use disorder: Counseled to quit smoking.  Follow-up with palliative care  Diet Order             Diet regular Room service appropriate? Yes;  Fluid consistency: Thin  Diet effective now                      Consultants: Pulmonologist Intensivist Palliative care  Procedures: None    Medications:    amiodarone  400 mg Oral BID   apixaban  5 mg Oral BID   diltiazem  60 mg Oral TID   fluticasone  2 spray Each Nare Daily   guaiFENesin  1,200 mg Oral BID   ipratropium  0.5 mg Nebulization Q6H   levalbuterol  1.25 mg Nebulization Q6H   methylPREDNISolone (SOLU-MEDROL) injection  40 mg Intravenous Q24H   milk and molasses  1 enema Rectal Once   multivitamin with minerals  1 tablet Oral Daily   pravastatin  40 mg Oral q1800   senna-docusate  2 tablet Oral BID   Continuous Infusions:   Anti-infectives (From admission, onward)    Start     Dose/Rate Route Frequency Ordered Stop   11/07/21 2000  azithromycin (ZITHROMAX) 500 mg in sodium chloride 0.9 % 250 mL IVPB        500 mg 250 mL/hr over 60 Minutes Intravenous Every 24 hours 11/07/21 1926 11/11/21 2105              Family Communication/Anticipated D/C date and plan/Code Status   DVT prophylaxis:  apixaban (ELIQUIS) tablet 5 mg     Code Status: DNR  Family Communication: Margaret Stevenson, son, at the bedside Disposition Plan: Plan to discharge to SNF   Status is: Inpatient  Remains inpatient appropriate because: Hypoxemic on oxygen via heated humidified HFNC  Subjective:   C/o difficulty breathing even at rest and constipation.  No vomiting or abdominal pain.  Her son was at the bedside.  Objective:    Vitals:   11/15/21 1400 11/15/21 1448 11/15/21 2233 11/16/21 0443  BP: (!) 129/57   (!) 133/58  Pulse: 71   77  Resp: 19   20  Temp:    98.5 F (36.9 C)  TempSrc:    Oral  SpO2: 91% 92% 94% 91%   No data found.   Intake/Output Summary (Last 24 hours) at 11/16/2021 1500 Last data filed at 11/16/2021 1010 Gross per 24 hour  Intake 360 ml  Output --  Net 360 ml   There were no vitals filed for this  visit.  Exam:  GEN: NAD SKIN: No rash EYES: EOMI ENT: MMM CV: RRR PULM: Decreased air entry bilaterally.  No wheezing or rales heard ABD: soft, ND, NT, +BS CNS: AAO x 3, non focal EXT: No edema or tenderness            Data Reviewed:   I have personally reviewed following labs and imaging studies:  Labs: Labs show the following:   Basic Metabolic Panel: Recent Labs  Lab 11/10/21 0519 11/11/21 0551 11/12/21 0409 11/13/21 0415 11/14/21 0350  NA 136 138 138 140 137  K 5.3* 4.8 4.4 4.4 4.0  CL 90* 90* 91* 92* 94*  CO2 39* 43* 40* 41* 39*  GLUCOSE 140* 149* 143* 149* 133*  BUN 13 16 19  26* 23  CREATININE 0.32* 0.34* 0.33* 0.34* 0.34*  CALCIUM 8.6* 9.1 8.6* 9.0 8.3*  MG  --   --  2.5*  --   --   PHOS  --   --  4.7*  --   --    GFR Estimated Creatinine Clearance: 57.5 mL/min (A) (by C-G formula based on SCr of 0.34 mg/dL (L)). Liver Function Tests: No results for input(s): AST, ALT, ALKPHOS, BILITOT, PROT, ALBUMIN in the last 168 hours. No results for input(s): LIPASE, AMYLASE in the last 168 hours. No results for input(s): AMMONIA in the last 168 hours. Coagulation profile No results for input(s): INR, PROTIME in the last 168 hours.  CBC: Recent Labs  Lab 11/12/21 0409 11/13/21 0415 11/14/21 0350  WBC 15.9* 17.1* 19.4*  NEUTROABS 14.8*  --   --   HGB 13.7 15.2* 14.0  HCT 43.5 50.0* 44.7  MCV 100.9* 101.0* 100.9*  PLT 215 226 207   Cardiac Enzymes: No results for input(s): CKTOTAL, CKMB, CKMBINDEX, TROPONINI in the last 168 hours. BNP (last 3 results) No results for input(s): PROBNP in the last 8760 hours. CBG: No results for input(s): GLUCAP in the last 168 hours. D-Dimer: No results for input(s): DDIMER in the last 72 hours. Hgb A1c: No results for input(s): HGBA1C in the last 72 hours. Lipid Profile: No results for input(s): CHOL, HDL, LDLCALC, TRIG, CHOLHDL, LDLDIRECT in the last 72 hours. Thyroid function studies: No results for  input(s): TSH, T4TOTAL, T3FREE, THYROIDAB in the last 72 hours.  Invalid input(s): FREET3  Anemia work up: No results for input(s): VITAMINB12, FOLATE, FERRITIN, TIBC, IRON, RETICCTPCT in the last 72 hours. Sepsis Labs: Recent Labs  Lab 11/10/21 0519 11/11/21 0551 11/12/21 0409 11/13/21 0415 11/14/21 0350  PROCALCITON <0.10 <0.10  --   --   --   WBC  --   --  15.9* 17.1* 19.4*    Microbiology No results found for this or any previous visit (from the past 240  hour(s)).   Procedures and diagnostic studies:  DG Abd 1 View  Result Date: 11/16/2021 CLINICAL DATA:  Constipation, abdominal pain EXAM: ABDOMEN - 1 VIEW COMPARISON:  None. FINDINGS: Stomach is slightly distended. Small bowel loops are not dilated. Gas and stool are present in colon. Large amount of stool is seen in the right colon. Stool seen in the pelvic cavity may be due to stool in the cecum or rectosigmoid. Arterial calcifications are seen in the soft tissues. Kidneys are partly obscured by bowel contents. There are linear densities in the lower lung fields suggesting scarring or subsegmental atelectasis. Degenerative changes are noted in the lumbar spine. IMPRESSION: Nonspecific bowel gas pattern. Large amount of stool is seen in the right colon. Electronically Signed   By: Elmer Picker M.D.   On: 11/16/2021 10:14   DG Chest Port 1 View  Result Date: 11/16/2021 CLINICAL DATA:  Difficulty breathing EXAM: PORTABLE CHEST 1 VIEW COMPARISON:  Previous studies including the examination of 11/11/2021 FINDINGS: Cardiac size is within normal limits. Increased interstitial markings are seen in the parahilar regions and lower lung fields. There are transverse linear densities in the right lower lung fields. There is no focal consolidation. There is possible blunting of right lateral CP angle. There is no pneumothorax. IMPRESSION: Increased interstitial markings in the parahilar regions and lower lung fields suggest scarring or  interstitial edema or interstitial pneumonitis. There are linear densities in the lower lung fields, more so on the right side suggesting subsegmental atelectasis with possible interval worsening Electronically Signed   By: Elmer Picker M.D.   On: 11/16/2021 10:11               LOS: 11 days   Margaret Stevenson  [facility]   Pager on [email or web address]. If 7PM-7AM, please contact night-coverage at [email or web address]     11/16/2021, 3:00 PM

## 2021-11-16 NOTE — Consult Note (Signed)
NAME:  Margaret Stevenson, MRN:  [id number], DOB:  [birth year], LOS: 65 ADMISSION DATE:  11/05/2021, CONSULTATION DATE: 11/11/2021 REFERRING MD: Irine Seal, MD, CHIEF COMPLAINT: Persistent hypoxia  Brief Patient Report  75 y.o. female w/ a h/o COPD and tob abuse, who was admitted 11/28 w/ resp failure and COPD w/ finding of PAF. HPI  75 year old female with past medical history of COPD, previous melanoma and pulmonary nodules who was admitted on 11/05/2021 with difficulty breathing.  CT scan of the chest on 11/03/2021 after an ER visit that was negative for pulmonary embolism and showed stable pulmonary nodules. She presented back to the ED with worsening respiratory symptoms.  Hospital Course: During the course of admission patient developed episode of SVT with rates as high as 180s.  Valsalva maneuver and carotid massage performed with improvement.  Adenosine was considered but rhythm improved prior to being administered.  Patient later given IV Cardizem push with improvement.  Cardiology consulted patient started on Eliquis and p.o. Cardizem 30 mg every 6 hours with plans to switch to CD before DC.  Patient continued to require increased oxygenation from 89 L HFNC O2.  Weaning attempts was made to 6 L but patient desatted to low 80s at rest.  This morning patient continues to complain of shortness of breath states she is satting in the 90s but feels like she is satting in the 80s.  Due to increased oxygen requirement with persistent hypoxia PCCM consulted to assist with management.  11/12/21- family including son is at bedside, patient on HFNC 54/50.  Patient is slow to answer but lucid. 11/13/21 - Patient is continuing to require HFNC she has improved to 49/50.  She is overall with poor prognosis. We met together with palliative care.  We discussed hospice.  12.8/22- patient remains on HFNC. She is hopeful to improve. She is followed by palliative which sees reasonable.  11/16/21- patient seems to  have worsened and increased O2 requireent now >65% / 50L/min and patient is using Metaneb.  Repeat CXR this am.   Yesterday she tried OOB and got up for 1 min only prior to back to bed.  "Cant even roll over too weak".  KUB this am too.   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   Elevated CA 19-9 level 05/08/2017   Arthritis 05/07/2017   Asthma without status asthmaticus 05/07/2017   Borderline diabetes 05/07/2017   Hx of adenomatous colonic polyps 05/07/2017   Tobacco dependence 05/07/2017   Sepsis ([place]) 04/27/2017   Disease of biliary tract     Obstruction of bile duct     Jaundice     Hypoxia 10/11/2016   Anxiety 10/11/2016   COPD (chronic obstructive pulmonary disease) ([place]) 10/06/2016   CAP (community acquired pneumonia) 10/06/2016   Hyperglycemia 10/06/2016   Vaccine counseling 06/21/2016   History of malignant melanoma of skin 08/03/2013    Significant Hospital Events   11/28: Admitted to progressive care unit with acute on chronic respiratory failure secondary to COPD exacerbation/anxiety 11/30: Continue to wean oxygen  to 5 L/min. Cardiology consulted for Afib with RVR 12/1: Patient with increased oxygen requirement 8 to 9 L HFNC failed weaning. 12/2: Remains on high flow nasal cannula at 8 to 9 L to keep sats greater than 90% 12/3: Still requiring high oxygen also in A. fib with RVR 12/4:  PCCM consulted Consults:  Cardiology PCCM  Procedures:  None  Significant Diagnostic Tests:  11/28: Chest Xray> no acute cardiopulmonary process, emphysema 12/4: 7 mm right upper lobe  pulmonary nodule, better demonstrated on recent chest CT. 2. Peribronchial opacities with lower lobe predominance may represent bronchitic changes or interstitial pulmonary edema. 3. Emphysema  Micro Data:  11/28: SARS-CoV-2 PCR> negative 11/28: Influenza PCR> negative  Antimicrobials:  None  OBJECTIVE  Blood pressure (!) 133/58, pulse 77, temperature 98.5 F (36.9 C), temperature source Oral, resp. rate 20, SpO2 91 %.    FiO2 (%):  [65 %] 65 %   Intake/Output Summary (Last 24 hours) at 11/16/2021 0805 Last data filed at 11/15/2021 1700 Gross per 24 hour  Intake 200 ml  Output --  Net 200 ml    There were no vitals filed for this visit.  Physical Examination  GENERAL: 75 year-old patient lying in the bed with no acute distress.  EYES: Pupils equal, round, reactive to light and accommodation. No scleral icterus. Extraocular muscles intact.  HEENT: Head atraumatic, normocephalic. Oropharynx and nasopharynx clear.  NECK:  Supple, no jugular venous distention. No thyroid enlargement, no tenderness.  LUNGS: Decreased breath sounds bilaterally, no wheezing, rales,rhonchi or crepitation. No use of accessory muscles of respiration.  CARDIOVASCULAR: S1, S2 normal. No murmurs, rubs, or gallops.  ABDOMEN: Soft, nontender, nondistended. Bowel sounds present. No organomegaly or mass.  EXTREMITIES: No pedal edema, cyanosis, or clubbing.  NEUROLOGIC: Cranial nerves II through XII are intact.  Muscle strength 5/5 in all extremities. Sensation intact. Gait not checked.  PSYCHIATRIC: The patient is alert and oriented x 3.  SKIN: No obvious rash, lesion, or ulcer.   Labs/imaging that I havepersonally reviewed  (right click and "Reselect all SmartList Selections" daily)     Labs   CBC: Recent Labs  Lab 11/12/21 0409 11/13/21 0415 11/14/21 0350  WBC 15.9* 17.1* 19.4*  NEUTROABS 14.8*  --   --   HGB 13.7 15.2* 14.0  HCT 43.5 50.0* 44.7  MCV 100.9* 101.0* 100.9*  PLT 215 226 207      Basic Metabolic Panel: Recent Labs  Lab 11/10/21 0519 11/11/21 0551 11/12/21 0409 11/13/21 0415 11/14/21 0350  NA 136 138 138 140 137  K 5.3* 4.8 4.4 4.4 4.0  CL 90* 90* 91* 92* 94*  CO2 39* 43* 40* 41* 39*  GLUCOSE 140* 149* 143* 149* 133*  BUN 13 16 19  26* 23  CREATININE 0.32* 0.34* 0.33* 0.34* 0.34*  CALCIUM 8.6* 9.1 8.6* 9.0 8.3*  MG  --   --  2.5*  --   --   PHOS  --   --  4.7*  --   --     GFR: Estimated Creatinine Clearance: 57.5 mL/min (A) (by C-G formula based on SCr of 0.34 mg/dL (L)). Recent Labs  Lab 11/10/21 0519 11/11/21 0551 11/12/21 0409 11/13/21 0415 11/14/21 0350  PROCALCITON <0.10 <0.10  --   --   --   WBC  --   --  15.9* 17.1* 19.4*     Liver Function Tests: No results for input(s): AST, ALT, ALKPHOS, BILITOT, PROT, ALBUMIN in the last 168 hours. No results for input(s):  LIPASE, AMYLASE in the last 168 hours. No results for input(s): AMMONIA in the last 168 hours.  ABG    Component Value Date/Time   PHART 7.44 11/12/2021 0847   PCO2ART 68 (HH) 11/12/2021 0847   PO2ART 53 (L) 11/12/2021 0847   HCO3 46.2 (H) 11/12/2021 0847   O2SAT 88.3 11/12/2021 0847     Coagulation Profile: No results for input(s): INR, PROTIME in the last 168 hours.  Cardiac Enzymes: No results for input(s): CKTOTAL, CKMB, CKMBINDEX, TROPONINI in the last 168 hours.  HbA1C: Hgb A1c MFr Bld  Date/Time Value Ref Range Status  04/27/2017 12:17 AM 4.5 (L) 4.8 - 5.6 % Final    Comment:    (NOTE)         Pre-diabetes: 5.7 - 6.4         Diabetes: >6.4         Glycemic control for adults with diabetes: <7.0     CBG: No results for input(s): GLUCAP in the last 168 hours.  Review of Systems:   Review of Systems  Constitutional: Negative.   HENT: Negative.    Eyes: Negative.   Respiratory:  Positive for cough, sputum production, shortness of breath and wheezing. Negative for hemoptysis.   Cardiovascular:  Positive for palpitations, orthopnea and leg  swelling. Negative for chest pain, claudication and PND.  Gastrointestinal: Negative.   Genitourinary: Negative.   Musculoskeletal:  Positive for back pain and neck pain.  Skin: Negative.   Neurological: Negative.   Endo/Heme/Allergies: Negative.   Psychiatric/Behavioral:  The patient is nervous/anxious.     Past Medical History  She,  has a past medical history of Arthritis, Cancer ([place]), COPD (chronic obstructive pulmonary disease) ([place]), Dyspnea, and Jaundice (04/2017).   Surgical History    Past Surgical History:  Procedure Laterality Date   BREAST BIOPSY Right 08/26/2018   Affirm Bx ("x" clip) path pending   BUNIONECTOMY     COLONOSCOPY     COLONOSCOPY WITH PROPOFOL N/A 05/26/2017   Procedure: COLONOSCOPY WITH PROPOFOL;  Surgeon: Lucilla Lame, MD;  Location: [facility];  Service: Endoscopy;  Laterality: N/A;  please do not move appt time   ERCP N/A 04/25/2017   Procedure: ENDOSCOPIC RETROGRADE CHOLANGIOPANCREATOGRAPHY (ERCP);  Surgeon: Lucilla Lame, MD;  Location[facility] ENDOSCOPY;  Service: Endoscopy;  Laterality: N/A;   ERCP N/A 09/30/2017   Procedure: ENDOSCOPIC RETROGRADE CHOLANGIOPANCREATOGRAPHY (ERCP) Pancreatic stent removal;  Surgeon: Lucilla Lame, MD;  Location: [facility] ENDOSCOPY;  Service: Endoscopy;  Laterality: N/A;   POLYPECTOMY  05/26/2017   Procedure: POLYPECTOMY;  Surgeon: Lucilla Lame, MD;  Location: [facility];  Service: Endoscopy;;   TONSILLECTOMY       Social History   reports that she quit smoking 13 days ago. Her smoking use included cigarettes. She has a 28.50 pack-year smoking history. She has never used smokeless tobacco. She reports current alcohol use of about 7.0 standard drinks per week. She reports that she does not use drugs.   Family History   Her family history includes Breast cancer (age of onset: 65) in her mother.   Allergies Allergies  Allergen Reactions   Penicillins Hives    Has patient had a PCN reaction causing  immediate rash, facial/tongue/throat swelling, SOB or lightheadedness with hypotension: no Has patient had a PCN reaction causing severe rash involving mucus membranes or skin necrosis: no Has patient had a PCN reaction that required hospitalization no Has patient had a PCN reaction occurring within the last 10 years: no If  all of the above answers are "NO", then may proceed with Cephalosporin use.      Home Medications  Prior to Admission medications   Medication Sig Start Date End Date Taking? Authorizing Provider  albuterol (PROVENTIL) (2.5 MG/3ML) 0.083% nebulizer solution Take 3 mLs (2.5 mg total) by nebulization every 4 (four) hours as needed for wheezing or shortness of breath. 11/03/21 11/03/22  Naaman Plummer, MD  albuterol (VENTOLIN HFA) 108 (90 Base) MCG/ACT inhaler Inhale 2 puffs into the lungs every 4 (four) hours as needed for shortness of breath.  08/27/16 01/24/19  [provider]  aspirin EC 81 MG tablet Take 81 mg by mouth daily. 10/11/21   [provider]  Fluticasone-Salmeterol (ADVAIR) 250-50 MCG/DOSE AEPB Inhale 1 puff into the lungs 2 (two) times daily.    [provider]  lovastatin (MEVACOR) 40 MG tablet Take 40 mg by mouth daily with supper. 10/11/21   [provider]  OXYGEN Inhale 3 L into the lungs. 1 L when not active    [provider]  tiotropium (SPIRIVA) 18 MCG inhalation capsule Place 18 mcg into inhaler and inhale at bedtime.  08/27/16 01/24/19  [provider]  TURMERIC PO Take by mouth.    [provider]    Scheduled Meds:  amiodarone  400 mg Oral BID   apixaban  5 mg Oral BID   budesonide (PULMICORT) nebulizer solution  0.5 mg Nebulization BID   diltiazem  60 mg Oral TID   fluticasone  2 spray Each Nare Daily   guaiFENesin  1,200 mg Oral BID   ipratropium  0.5 mg Nebulization Q6H   levalbuterol  1.25 mg Nebulization Q6H   methylPREDNISolone (SOLU-MEDROL) injection  40 mg Intravenous Q24H    milk and molasses  1 enema Rectal Once   multivitamin with minerals  1 tablet Oral Daily   pravastatin  40 mg Oral q1800   senna-docusate  2 tablet Oral BID   Continuous Infusions:   PRN Meds:.ALPRAZolam, bisacodyl, levalbuterol, morphine injection, naproxen, polyethylene glycol, senna-docusate, sodium chloride, sodium chloride flush    Assessment & Plan:  Acute on chronic hypercapnic hypoxic respiratory failure secondary to AECOPD  PMHx: Asthma, COPD on Chronic home oxygen at 3L, current everyday smoker  -Supplemental oxygen as needed, maintain SpO2 > 88% -Intermittent chest x-ray & ABG PRN -Ensure adequate pulmonary hygiene -I have reduced steroids ot solumedrol $RemoveBefor'40mg'IylgAMMmSOtZ$  daily - no need for antibiotics -Palliative care and hospice recommendation today and reviewed end of life care options.   Severe constipation     - s/p enema - no stool     - KUB today   Paroxysmal atrial fibrillation Runs of SVT New since admission likely in the setting of AECOPD as above -Continue diltiazem 240 daily for rate control and as BP permits -Continue Eliquis $RemoveBeforeDEI'5mg'UiMrfUhFhOKIWfox$  BID  CHA2DS2VASc  -If uncontrolled with above plan for AAD per cardio recommendations -Replace electrolytes,  Hyperglycemia likely steroid-induced -Check hemoglobin A1c -CBGs -Sliding scale insulin -Follow hyper/hypoglycemia protocol ` Best practice:  Diet:  Oral Pain/Anxiety/Delirium protocol (if indicated): No VAP protocol (if indicated): Not indicated DVT prophylaxis: Systemic AC GI prophylaxis: N/A Glucose control:  SSI Yes Central venous access:  N/A Arterial line:  N/A Foley:  N/A Mobility:  bed rest  PT consulted: Yes Last date of multidisciplinary goals of care discussion [12/4] Code Status:  DNR Disposition: Progressive Care Unit   = Goals of Care = Code Status Order: $RemoveBefor'@CODE'WKophLQOCIKg$ @   Primary Emergency Contact: Curley,Jack,  Home Phone: [phone] Patient wishes to pursue ongoing treatment, but concurred that if  deteriorated to pulselessness, patient would prefer a natural death as opposed to invasive measures such as CPR and intubation.      Ottie Glazier, M.D.  Pulmonary & [place]

## 2021-11-16 NOTE — Progress Notes (Signed)
Retrieved order from NP for bipap. Patient questions how long will she have to wear.  I explained therapy duration would be based oh her symptoms and if she feels the bipap is helping. Patient has requested to hold off on bipap as she is feeling better since getting meds from RN. Bipap on sb

## 2021-11-16 NOTE — Progress Notes (Signed)
Patient seen for svn. Questioning about turning up oxygen to help relieve her sob. RN in to give her anxiety meds as well as others. Questions are arising about a trilogy as patient has one at home. Addressed those are questions that should have been brought up with pulmonologist or hospitalist. Prior notes indicate early in admission patient kept taking BiPAP off when in use. Told patient will send request of md for bipap as needed. Patient has diminished breath sounds. O2 sats remain in 90's oh HFNC.  Not able to wean.

## 2021-11-16 NOTE — Progress Notes (Signed)
Pt instructed on use of Metaneb and explained on how long it would take. Patient refused to follow instructions with Metaneb on how to use correctly. I also stated that I would like to try to wean O2 today and she stated that she did not want it messed with and she has been through this before. Dr Lanney Gins was contacted and made aware.

## 2021-11-16 NOTE — Progress Notes (Signed)
Progress Note  Patient Name: Margaret Stevenson Date of Encounter: 11/16/2021  [facility] Cardiologist: Kate Sable, MD   Subjective   Patient remains on high flow O2. Pulmonology saw this AM. No chest pain. She remains in SR on tele.   Inpatient Medications    Scheduled Meds:  amiodarone  400 mg Oral BID   apixaban  5 mg Oral BID   diltiazem  60 mg Oral TID   fluticasone  2 spray Each Nare Daily   guaiFENesin  1,200 mg Oral BID   ipratropium  0.5 mg Nebulization Q6H   levalbuterol  1.25 mg Nebulization Q6H   methylPREDNISolone (SOLU-MEDROL) injection  40 mg Intravenous Q24H   milk and molasses  1 enema Rectal Once   multivitamin with minerals  1 tablet Oral Daily   pravastatin  40 mg Oral q1800   senna-docusate  2 tablet Oral BID   Continuous Infusions:  PRN Meds: ALPRAZolam, bisacodyl, levalbuterol, morphine injection, naproxen, polyethylene glycol, senna-docusate, sodium chloride, sodium chloride flush   Vital Signs    Vitals:   11/15/21 1400 11/15/21 1448 11/15/21 2233 11/16/21 0443  BP: (!) 129/57   (!) 133/58  Pulse: 71   77  Resp: 19   20  Temp:    98.5 F (36.9 C)  TempSrc:    Oral  SpO2: 91% 92% 94% 91%    Intake/Output Summary (Last 24 hours) at 11/16/2021 0958 Last data filed at 11/15/2021 1700 Gross per 24 hour  Intake 160 ml  Output --  Net 160 ml   Last 3 Weights 11/03/2021 04/06/2020 01/26/2018  Weight (lbs) 130 lb 130 lb 137 lb 12.6 oz  Weight (kg) 58.968 kg 58.968 kg 62.5 kg      Telemetry    NST, HR 70, SB overnight - Personally Reviewed  ECG    No new - Personally Reviewed  Physical Exam   GEN: No acute distress.   Neck: No JVD Cardiac: RRR, no murmurs, rubs, or gallops.  Respiratory: diminished breath sounds GI: Soft, nontender, non-distended  MS: No edema; No deformity. Neuro:  Nonfocal  Psych: Normal affect   Labs    High Sensitivity Troponin:   Recent Labs  Lab 11/03/21 0252 11/03/21 0536 11/05/21 2057  11/06/21 0204  TROPONINIHS 5 5 8 6      Chemistry Recent Labs  Lab 11/12/21 0409 11/13/21 0415 11/14/21 0350  NA 138 140 137  K 4.4 4.4 4.0  CL 91* 92* 94*  CO2 40* 41* 39*  GLUCOSE 143* 149* 133*  BUN 19 26* 23  CREATININE 0.33* 0.34* 0.34*  CALCIUM 8.6* 9.0 8.3*  MG 2.5*  --   --   GFRNONAA >60 >60 >60  ANIONGAP 7 7 4*    Lipids No results for input(s): CHOL, TRIG, HDL, LABVLDL, LDLCALC, CHOLHDL in the last 168 hours.  Hematology Recent Labs  Lab 11/12/21 0409 11/13/21 0415 11/14/21 0350  WBC 15.9* 17.1* 19.4*  RBC 4.31 4.95 4.43  HGB 13.7 15.2* 14.0  HCT 43.5 50.0* 44.7  MCV 100.9* 101.0* 100.9*  MCH 31.8 30.7 31.6  MCHC 31.5 30.4 31.3  RDW 12.8 13.0 13.0  PLT 215 226 207   Thyroid  Recent Labs  Lab 11/12/21 0409  TSH 1.188    BNP Recent Labs  Lab 11/11/21 1032  BNP 96.4    DDimer No results for input(s): DDIMER in the last 168 hours.   Radiology    No results found.  Cardiac Studies   Echo  10/2021 1. Left ventricular ejection fraction, by estimation, is 55 to 60%. Left  ventricular ejection fraction by PLAX is 56 %. The left ventricle has  normal function. The left ventricle has no regional wall motion  abnormalities. Left ventricular diastolic  parameters are indeterminate.   2. Right ventricular systolic function is normal. The right ventricular  size is normal.   3. The mitral valve is normal in structure. No evidence of mitral valve  regurgitation.   4. The aortic valve was not well visualized. Aortic valve regurgitation  is not visualized.   5. The inferior vena cava is normal in size with greater than 50%  respiratory variability, suggesting right atrial pressure of 3 mmHg.   Patient Profile     75 y.o. female  with  h/o history of hyperlipidemia, COPD presenting with shortness of breath, diagnosed with COPD exacerbation being seen for A. fib RVR.  Assessment & Plan   Afib RVR - IV Cardizem >>oral cardizem - she is maintaining  SR/SB - CHADSVASC at least 3 - Echo with preserved LVEF - continue Eliquis 5mg  for anticoagulation - started on amiodarone 400mg  BID>>continue x7 das>200mg  BID for 7 days>200mg  daily thereafter   COPD exacerbation - on 2-3L at home - on high flow>>pt not wanting to wean - Inhalers/steroids/abx  perIM - pulmonology consulted>> steroids decreased, no abx, recommended palliative consult   HLD - PTA pravachol   Coronary artery calcification - Noted on CT imaging - No chest pain - HS troponin negative x 4 - EF normal by echo  For questions or updates, please contact [facility] Please consult [email or web address] for contact info under        Signed, Latricia Cerrito Ninfa Meeker, PA-C  11/16/2021, 9:58 AM

## 2021-11-16 NOTE — Progress Notes (Signed)
Palliative: Margaret Stevenson is lying quietly in bed.  She appears acutely/chronically ill and quite frail.  She is alert and oriented, able to make her needs known.  There is no family at bedside at this time.  Nursing staff is at bedside, she has just administered a suppository.  We talked about KUB, stool burden and right colon.  Margaret Stevenson shares that she had no results from yesterday's suppository or enema.  We talked about senna as 2 tabs twice daily that started last night.  I shared that at this point, we need to go from the top down, not the bottom up.  I encourage Margaret Stevenson to raise her knees and right her heel along the bed to help with bowel motility.  She tells me, "I cannot move".  I encouraged her to do gentle abdomen massage, but she does not seem open to this either.  Conference with attending and transition of care team related to patient condition, needs, goals of care.  Plan: At this point continue to treat the treatable but no CPR or intubation.  Time for outcomes.  If no meaningful improvement in the next few days hospice care would be appropriate.  66 minutes Quinn Axe, NP Palliative medicine team Team phone [phone] Greater than 50% of this time was spent counseling and coordinating care related to the above assessment and plan.

## 2021-11-17 DIAGNOSIS — J441 Chronic obstructive pulmonary disease with (acute) exacerbation: Secondary | ICD-10-CM | POA: Diagnosis not present

## 2021-11-17 DIAGNOSIS — J9621 Acute and chronic respiratory failure with hypoxia: Secondary | ICD-10-CM | POA: Diagnosis not present

## 2021-11-17 LAB — CBC
HCT: 42.9 % (ref 36.0–46.0)
Hemoglobin: 13.6 g/dL (ref 12.0–15.0)
MCH: 31.1 pg (ref 26.0–34.0)
MCHC: 31.7 g/dL (ref 30.0–36.0)
MCV: 98.2 fL (ref 80.0–100.0)
Platelets: 254 10*3/uL (ref 150–400)
RBC: 4.37 MIL/uL (ref 3.87–5.11)
RDW: 13.1 % (ref 11.5–15.5)
WBC: 23.7 10*3/uL — ABNORMAL HIGH (ref 4.0–10.5)
nRBC: 0 % (ref 0.0–0.2)

## 2021-11-17 MED ORDER — IPRATROPIUM-ALBUTEROL 0.5-2.5 (3) MG/3ML IN SOLN
3.0000 mL | Freq: Three times a day (TID) | RESPIRATORY_TRACT | Status: DC
  Administered 2021-11-17: 3 mL via RESPIRATORY_TRACT
  Filled 2021-11-17: qty 3

## 2021-11-17 MED ORDER — DILTIAZEM HCL ER COATED BEADS 180 MG PO CP24
180.0000 mg | ORAL_CAPSULE | Freq: Every day | ORAL | Status: DC
  Administered 2021-11-17 – 2021-11-27 (×10): 180 mg via ORAL
  Filled 2021-11-17 (×10): qty 1

## 2021-11-17 MED ORDER — CEFTRIAXONE SODIUM 1 G IJ SOLR
1.0000 g | INTRAMUSCULAR | Status: DC
  Administered 2021-11-17: 1 g via INTRAVENOUS
  Filled 2021-11-17: qty 1
  Filled 2021-11-17: qty 10

## 2021-11-17 MED ORDER — IPRATROPIUM-ALBUTEROL 0.5-2.5 (3) MG/3ML IN SOLN
3.0000 mL | Freq: Four times a day (QID) | RESPIRATORY_TRACT | Status: DC
  Administered 2021-11-17 – 2021-11-27 (×40): 3 mL via RESPIRATORY_TRACT
  Filled 2021-11-17 (×40): qty 3

## 2021-11-17 NOTE — Progress Notes (Addendum)
Progress Note    Margaret Stevenson  BDZ:[id number] DOB: 1946-05-19  DOA: 11/05/2021 PCP: Dion Body, MD      Brief Narrative:    Medical records reviewed and are as summarized below:  Margaret Stevenson is a 75 y.o. female with medical history significant for COPD, chronic hypoxemic respiratory failure, pulmonary nodules, history of melanoma, who presented to the hospital because of difficulty breathing.      Assessment/Plan:   Principal Problem:   COPD with acute exacerbation (HCC) Active Problems:   Tobacco dependence   Acute on chronic respiratory failure with hypoxia (HCC)   Paroxysmal atrial fibrillation (HCC)   Malnutrition of moderate degree   Hypokalemia   Nutrition Problem: Moderate Malnutrition Etiology: chronic illness (COPD)  Signs/Symptoms: mild fat depletion, moderate fat depletion, mild muscle depletion, moderate muscle depletion    Acute on chronic hypoxemic respiratory failure: She is on oxygen via heated humidified HFNC at 65% at 45 L/min.  Difficulty weaning down oxygen.  COPD exacerbation: Chest x-ray on 11/16/2021 showed increased interstitial markings.  And IV ceftriaxone.  Continue IV steroids and bronchodilators.  Continue chest PT with vest.  Follow-up with pulmonologist for further recommendations.  Completed 5 days of azithromycin.  Leukocytosis: This is likely from steroids.  Paroxysmal atrial fibrillation with RVR: She has been in normal sinus since amiodarone was started.  Continue amiodarone, diltiazem and Eliquis.    Hypokalemia: Improved  Constipation: Continue laxatives.  Abdominal x-ray on 11/16/2021 showed large amount of stool in the right colon.  Pulmonary nodules: PET scan in April 2022 suggestive of benign etiology.  Outpatient follow-up with CT scan for continued surveillance recommended.  Tobacco use disorder: Counseled to quit smoking.  Follow-up with palliative care  Diet Order             Diet regular  Room service appropriate? Yes; Fluid consistency: Thin  Diet effective now                      Consultants: Pulmonologist Intensivist Palliative care  Procedures: None    Medications:    amiodarone  400 mg Oral BID   apixaban  5 mg Oral BID   diltiazem  180 mg Oral Daily   fluticasone  2 spray Each Nare Daily   guaiFENesin  1,200 mg Oral BID   ipratropium-albuterol  3 mL Nebulization TID   methylPREDNISolone (SOLU-MEDROL) injection  40 mg Intravenous Q24H   milk and molasses  1 enema Rectal Once   multivitamin with minerals  1 tablet Oral Daily   pravastatin  40 mg Oral q1800   senna-docusate  2 tablet Oral BID   Continuous Infusions:  cefTRIAXone (ROCEPHIN)  IV 1 g (11/17/21 1227)     Anti-infectives (From admission, onward)    Start     Dose/Rate Route Frequency Ordered Stop   11/17/21 1100  cefTRIAXone (ROCEPHIN) 1 g in sodium chloride 0.9 % 100 mL IVPB        1 g 200 mL/hr over 30 Minutes Intravenous Every 24 hours 11/17/21 0928     11/07/21 2000  azithromycin (ZITHROMAX) 500 mg in sodium chloride 0.9 % 250 mL IVPB        500 mg 250 mL/hr over 60 Minutes Intravenous Every 24 hours 11/07/21 1926 11/11/21 2105              Family Communication/Anticipated D/C date and plan/Code Status   DVT prophylaxis:  apixaban (ELIQUIS) tablet 5 mg  Code Status: DNR  Family Communication: Barnabas Lister, son, at the bedside Disposition Plan: Plan to discharge to SNF   Status is: Inpatient  Remains inpatient appropriate because: Hypoxemic on oxygen via heated humidified HFNC           Subjective:   C/o cough and she is starting to bring up some phlegm.  She still has trouble breathing but she thinks her breathing was better overnight.  She does not feel she is making a lot of progress.  Her son was at the bedside.  Her nurse was also at the bedside.   Objective:    Vitals:   11/17/21 0730 11/17/21 0831 11/17/21 1200 11/17/21 1339  BP:   126/62 (!) 141/64   Pulse:  77 74   Resp:  19 17   Temp:  98 F (36.7 C) 98.5 F (36.9 C)   TempSrc:  Oral Oral   SpO2: 95% 97% 93% 90%  Weight:   54 kg    No data found.   Intake/Output Summary (Last 24 hours) at 11/17/2021 1439 Last data filed at 11/17/2021 1027 Gross per 24 hour  Intake 0 ml  Output 300 ml  Net -300 ml   Filed Weights   11/17/21 1200  Weight: 54 kg    Exam:  GEN: No acute distress SKIN: No rash EYES: EOMI ENT: MMM CV: RRR PULM: Decreased air entry bilaterally.  No rales or wheezing ABD: soft, ND, NT, +BS CNS: AAO x 3, non focal EXT: No edema or tenderness             Data Reviewed:   I have personally reviewed following labs and imaging studies:  Labs: Labs show the following:   Basic Metabolic Panel: Recent Labs  Lab 11/11/21 0551 11/12/21 0409 11/13/21 0415 11/14/21 0350  NA 138 138 140 137  K 4.8 4.4 4.4 4.0  CL 90* 91* 92* 94*  CO2 43* 40* 41* 39*  GLUCOSE 149* 143* 149* 133*  BUN 16 19 26* 23  CREATININE 0.34* 0.33* 0.34* 0.34*  CALCIUM 9.1 8.6* 9.0 8.3*  MG  --  2.5*  --   --   PHOS  --  4.7*  --   --    GFR Estimated Creatinine Clearance: 52.6 mL/min (A) (by C-G formula based on SCr of 0.34 mg/dL (L)). Liver Function Tests: No results for input(s): AST, ALT, ALKPHOS, BILITOT, PROT, ALBUMIN in the last 168 hours. No results for input(s): LIPASE, AMYLASE in the last 168 hours. No results for input(s): AMMONIA in the last 168 hours. Coagulation profile No results for input(s): INR, PROTIME in the last 168 hours.  CBC: Recent Labs  Lab 11/12/21 0409 11/13/21 0415 11/14/21 0350 11/17/21 1247  WBC 15.9* 17.1* 19.4* 23.7*  NEUTROABS 14.8*  --   --   --   HGB 13.7 15.2* 14.0 13.6  HCT 43.5 50.0* 44.7 42.9  MCV 100.9* 101.0* 100.9* 98.2  PLT 215 226 207 254   Cardiac Enzymes: No results for input(s): CKTOTAL, CKMB, CKMBINDEX, TROPONINI in the last 168 hours. BNP (last 3 results) No results for  input(s): PROBNP in the last 8760 hours. CBG: No results for input(s): GLUCAP in the last 168 hours. D-Dimer: No results for input(s): DDIMER in the last 72 hours. Hgb A1c: No results for input(s): HGBA1C in the last 72 hours. Lipid Profile: No results for input(s): CHOL, HDL, LDLCALC, TRIG, CHOLHDL, LDLDIRECT in the last 72 hours. Thyroid function studies: No results for input(s): TSH, T4TOTAL,  T3FREE, THYROIDAB in the last 72 hours.  Invalid input(s): FREET3  Anemia work up: No results for input(s): VITAMINB12, FOLATE, FERRITIN, TIBC, IRON, RETICCTPCT in the last 72 hours. Sepsis Labs: Recent Labs  Lab 11/11/21 0551 11/12/21 0409 11/13/21 0415 11/14/21 0350 11/17/21 1247  PROCALCITON <0.10  --   --   --   --   WBC  --  15.9* 17.1* 19.4* 23.7*    Microbiology No results found for this or any previous visit (from the past 240 hour(s)).   Procedures and diagnostic studies:  DG Abd 1 View  Result Date: 11/16/2021 CLINICAL DATA:  Constipation, abdominal pain EXAM: ABDOMEN - 1 VIEW COMPARISON:  None. FINDINGS: Stomach is slightly distended. Small bowel loops are not dilated. Gas and stool are present in colon. Large amount of stool is seen in the right colon. Stool seen in the pelvic cavity may be due to stool in the cecum or rectosigmoid. Arterial calcifications are seen in the soft tissues. Kidneys are partly obscured by bowel contents. There are linear densities in the lower lung fields suggesting scarring or subsegmental atelectasis. Degenerative changes are noted in the lumbar spine. IMPRESSION: Nonspecific bowel gas pattern. Large amount of stool is seen in the right colon. Electronically Signed   By: Elmer Picker M.D.   On: 11/16/2021 10:14   DG Chest Port 1 View  Result Date: 11/16/2021 CLINICAL DATA:  Difficulty breathing EXAM: PORTABLE CHEST 1 VIEW COMPARISON:  Previous studies including the examination of 11/11/2021 FINDINGS: Cardiac size is within normal limits.  Increased interstitial markings are seen in the parahilar regions and lower lung fields. There are transverse linear densities in the right lower lung fields. There is no focal consolidation. There is possible blunting of right lateral CP angle. There is no pneumothorax. IMPRESSION: Increased interstitial markings in the parahilar regions and lower lung fields suggest scarring or interstitial edema or interstitial pneumonitis. There are linear densities in the lower lung fields, more so on the right side suggesting subsegmental atelectasis with possible interval worsening Electronically Signed   By: Elmer Picker M.D.   On: 11/16/2021 10:11               LOS: 12 days   Samyra Limb  [facility]   Pager on [email or web address]. If 7PM-7AM, please contact night-coverage at [email or web address]     11/17/2021, 2:39 PM

## 2021-11-17 NOTE — Progress Notes (Addendum)
   No further afib noted on telemetry. Continue amiodarone 400mg BID x7 days> 200mg  BID>200mg  daily. Consolidate diltazem to 180mg  daily. Will arrange outpatient follow-up  Tamia Dial Kathlen Mody, PA-C  [facility] will sign off.   Medication Recommendations:  amiodarone 400mg  BID>200mg  BID>200mg  daily, Cardizem 180mg  daily, Eliquis 5mg  BID Other recommendations (labs, testing, etc):  N/A Follow up as an outpatient:  Follow-up with cardiology in 3-4 weeks

## 2021-11-17 NOTE — Consult Note (Signed)
NAME:  Margaret Stevenson, MRN:  [id number], DOB:  08/05/1946, LOS: 12 ADMISSION DATE:  11/05/2021, CONSULTATION DATE: 11/11/2021 REFERRING MD: Ramiro Harvest, MD, CHIEF COMPLAINT: Persistent hypoxia  Brief Patient Report  75 y.o. female w/ a h/o COPD and tob abuse, who was admitted 11/28 w/ resp failure and COPD w/ finding of PAF. HPI  75 year old female with past medical history of COPD, previous melanoma and pulmonary nodules who was admitted on 11/05/2021 with difficulty breathing.  CT scan of the chest on 11/03/2021 after an ER visit that was negative for pulmonary embolism and showed stable pulmonary nodules. She presented back to the ED with worsening respiratory symptoms.  Hospital Course: During the course of admission patient developed episode of SVT with rates as high as 180s.  Valsalva maneuver and carotid massage performed with improvement.  Adenosine was considered but rhythm improved prior to being administered.  Patient later given IV Cardizem push with improvement.  Cardiology consulted patient started on Eliquis and p.o. Cardizem 30 mg every 6 hours with plans to switch to CD before DC.  Patient continued to require increased oxygenation from 89 L HFNC O2.  Weaning attempts was made to 6 L but patient desatted to low 80s at rest.  This morning patient continues to complain of shortness of breath states she is satting in the 90s but feels like she is satting in the 80s.  Due to increased oxygen requirement with persistent hypoxia PCCM consulted to assist with management.  11/12/21- family including son is at bedside, patient on HFNC 54/50.  Patient is slow to answer but lucid. 11/13/21 - Patient is continuing to require HFNC she has improved to 49/50.  She is overall with poor prognosis. We met together with palliative care.  We discussed hospice.  12.8/22- patient remains on HFNC. She is hopeful to improve. She is followed by palliative which sees reasonable.  11/16/21- patient seems to  have worsened and increased O2 requireent now >65% / 50L/min and patient is using Metaneb.  Repeat CXR this am.   Yesterday she tried OOB and got up for 1 min only prior to back to bed.  "Cant even roll over too weak".  KUB this am too.  11/17/21- patient wants to continue to work to improve and get better. She wants to know what is her prognosis. Overall patient has poor prognosis.   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   Elevated CA 19-9 level 05/08/2017   Arthritis 05/07/2017   Asthma without status asthmaticus 05/07/2017   Borderline diabetes 05/07/2017   Hx of adenomatous colonic polyps 05/07/2017   Tobacco dependence 05/07/2017   Sepsis (HCC) 04/27/2017   Disease of biliary tract     Obstruction of bile duct     Jaundice     Hypoxia 10/11/2016   Anxiety 10/11/2016   COPD (chronic obstructive pulmonary disease) (HCC) 10/06/2016   CAP (community acquired pneumonia) 10/06/2016   Hyperglycemia 10/06/2016   Vaccine counseling 06/21/2016   History of malignant melanoma of skin 08/03/2013  Significant Hospital Events   11/28: Admitted to progressive care unit with acute on chronic respiratory failure secondary to COPD exacerbation/anxiety 11/30: Continue to wean oxygen to 5 L/min. Cardiology consulted for Afib with RVR 12/1: Patient with increased oxygen requirement 8 to 9 L HFNC failed weaning. 12/2: Remains on high flow nasal cannula at 8 to 9 L to keep sats greater than 90% 12/3: Still requiring high oxygen also in A. fib with RVR 12/4:  PCCM consulted Consults:   Cardiology PCCM  Procedures:  None  Significant Diagnostic Tests:  11/28: Chest Xray> no acute cardiopulmonary process, emphysema 12/4: 7 mm right upper lobe pulmonary nodule, better demonstrated on recent chest CT. 2. Peribronchial opacities with lower lobe predominance may represent bronchitic changes or interstitial pulmonary edema. 3. Emphysema  Micro Data:  11/28: SARS-CoV-2 PCR> negative 11/28: Influenza PCR> negative  Antimicrobials:  None  OBJECTIVE  Blood pressure (!) 134/58, pulse 80, temperature (!) 97.5 F (36.4 C), temperature source Oral, resp. rate 19, weight 54 kg, SpO2 92 %.    FiO2 (%):  [65 %-66 %] 66 %   Intake/Output Summary (Last 24 hours) at 11/17/2021 1814 Last data filed at 11/17/2021 1637 Gross per 24 hour  Intake 99.24 ml  Output 300 ml  Net -200.76 ml    Filed Weights   11/17/21 1200  Weight: 54 kg    Physical Examination  GENERAL: 75 year-old patient lying in the bed with no acute distress.  EYES: Pupils equal, round, reactive to light and accommodation. No scleral icterus. Extraocular muscles intact.  HEENT: Head atraumatic, normocephalic. Oropharynx and nasopharynx clear.  NECK:  Supple, no jugular venous distention. No thyroid enlargement, no tenderness.  LUNGS: Decreased breath sounds bilaterally, no wheezing, rales,rhonchi or crepitation. No use of accessory muscles of respiration.  CARDIOVASCULAR: S1, S2 normal. No murmurs, rubs, or gallops.  ABDOMEN: Soft, nontender, nondistended. Bowel sounds present. No organomegaly or mass.  EXTREMITIES: No pedal edema, cyanosis, or clubbing.  NEUROLOGIC: Cranial nerves II through XII are intact.  Muscle strength 5/5 in all extremities. Sensation intact. Gait not checked.  PSYCHIATRIC: The patient is alert and oriented x 3.  SKIN: No obvious rash, lesion, or ulcer.   Labs/imaging that I havepersonally reviewed  (right click and "Reselect all SmartList Selections" daily)     Labs    CBC: Recent Labs  Lab 11/12/21 0409 11/13/21 0415 11/14/21 0350 11/17/21 1247  WBC 15.9* 17.1* 19.4* 23.7*  NEUTROABS 14.8*  --   --   --   HGB 13.7 15.2* 14.0 13.6  HCT 43.5 50.0* 44.7 42.9  MCV 100.9* 101.0* 100.9* 98.2  PLT 215 226 207 254     Basic Metabolic Panel: Recent Labs  Lab 11/11/21 0551 11/12/21 0409 11/13/21 0415 11/14/21 0350  NA 138 138 140 137  K 4.8 4.4 4.4 4.0  CL 90* 91* 92* 94*  CO2 43* 40* 41* 39*  GLUCOSE 149* 143* 149* 133*  BUN 16 19 26* 23  CREATININE 0.34* 0.33* 0.34* 0.34*  CALCIUM 9.1 8.6* 9.0 8.3*  MG  --  2.5*  --   --   PHOS  --  4.7*  --   --     GFR: Estimated Creatinine Clearance: 52.6 mL/min (A) (by C-G formula based on SCr of 0.34 mg/dL (L)). Recent Labs  Lab 11/11/21 0551 11/12/21 0409 11/13/21 0415 11/14/21 0350 11/17/21 1247  PROCALCITON <0.10  --   --   --   --   WBC  --  15.9* 17.1* 19.4* 23.7*     Liver Function Tests: No results for input(s): AST, ALT, ALKPHOS, BILITOT, PROT, ALBUMIN in the last 168 hours. No results for input(s): LIPASE, AMYLASE in the last 168 hours. No results for input(s): AMMONIA in the last 168 hours.  ABG    Component Value Date/Time   PHART 7.44 11/12/2021 0847   PCO2ART 68 (HH) 11/12/2021 0847   PO2ART 53 (L) 11/12/2021 0847   HCO3 46.2 (H) 11/12/2021 0847   O2SAT 88.3 11/12/2021 0847     Coagulation Profile: No results for input(s): INR, PROTIME in the last 168 hours.  Cardiac Enzymes: No results for input(s): CKTOTAL, CKMB, CKMBINDEX, TROPONINI in the last 168 hours.  HbA1C: Hgb A1c MFr Bld  Date/Time Value Ref Range Status  04/27/2017 12:17 AM 4.5 (L) 4.8 - 5.6 % Final    Comment:    (NOTE)         Pre-diabetes: 5.7 - 6.4         Diabetes: >6.4         Glycemic control for adults with diabetes: <7.0     CBG: No results for input(s): GLUCAP in the last 168 hours.  Review of Systems:   Review of Systems  Constitutional: Negative.   HENT: Negative.    Eyes:  Negative.   Respiratory:  Positive for cough, sputum production, shortness of breath and wheezing. Negative for hemoptysis.   Cardiovascular:  Positive for palpitations, orthopnea and leg swelling. Negative for chest pain, claudication and PND.  Gastrointestinal: Negative.   Genitourinary: Negative.   Musculoskeletal:  Positive for back pain and neck pain.  Skin: Negative.   Neurological: Negative.   Endo/Heme/Allergies: Negative.   Psychiatric/Behavioral:  The patient is nervous/anxious.     Past Medical History  She,  has a past medical history of Arthritis, Cancer ([place]), COPD (chronic obstructive pulmonary disease) ([place]), Dyspnea, and Jaundice (04/2017).   Surgical History    Past Surgical History:  Procedure Laterality Date   BREAST BIOPSY Right 08/26/2018   Affirm Bx ("x" clip) path pending   BUNIONECTOMY     COLONOSCOPY     COLONOSCOPY WITH PROPOFOL N/A 05/26/2017   Procedure: COLONOSCOPY WITH PROPOFOL;  Surgeon: Lucilla Lame, MD;  Location: [facility];  Service: Endoscopy;  Laterality: N/A;  please do not move appt time   ERCP N/A 04/25/2017   Procedure: ENDOSCOPIC RETROGRADE CHOLANGIOPANCREATOGRAPHY (ERCP);  Surgeon: Lucilla Lame, MD;  Location[facility] ENDOSCOPY;  Service: Endoscopy;  Laterality: N/A;   ERCP N/A 09/30/2017   Procedure: ENDOSCOPIC RETROGRADE CHOLANGIOPANCREATOGRAPHY (ERCP) Pancreatic stent removal;  Surgeon: Lucilla Lame, MD;  Location: [facility] ENDOSCOPY;  Service: Endoscopy;  Laterality: N/A;   POLYPECTOMY  05/26/2017   Procedure: POLYPECTOMY;  Surgeon: Lucilla Lame, MD;  Location: [facility];  Service: Endoscopy;;   TONSILLECTOMY       Social History   reports that she quit smoking about 2 weeks ago. Her smoking use included cigarettes. She has a 28.50 pack-year smoking history. She has never used smokeless tobacco. She reports current alcohol use of about 7.0 standard drinks per week. She reports that she does not use drugs.   Family History    Her family history includes Breast cancer (age of onset: 73) in her mother.   Allergies Allergies  Allergen Reactions   Penicillins Hives    Has patient had a PCN reaction causing immediate rash, facial/tongue/throat swelling, SOB or lightheadedness with hypotension: no Has patient had a PCN reaction causing severe rash  involving mucus membranes or skin necrosis: no Has patient had a PCN reaction that required hospitalization no Has patient had a PCN reaction occurring within the last 10 years: no If all of the above answers are "NO", then may proceed with Cephalosporin use.      Home Medications  Prior to Admission medications   Medication Sig Start Date End Date Taking? Authorizing Provider  albuterol (PROVENTIL) (2.5 MG/3ML) 0.083% nebulizer solution Take 3 mLs (2.5 mg total) by nebulization every 4 (four) hours as needed for wheezing or shortness of breath. 11/03/21 11/03/22  Naaman Plummer, MD  albuterol (VENTOLIN HFA) 108 (90 Base) MCG/ACT inhaler Inhale 2 puffs into the lungs every 4 (four) hours as needed for shortness of breath.  08/27/16 01/24/19  [provider]  aspirin EC 81 MG tablet Take 81 mg by mouth daily. 10/11/21   [provider]  Fluticasone-Salmeterol (ADVAIR) 250-50 MCG/DOSE AEPB Inhale 1 puff into the lungs 2 (two) times daily.    [provider]  lovastatin (MEVACOR) 40 MG tablet Take 40 mg by mouth daily with supper. 10/11/21   [provider]  OXYGEN Inhale 3 L into the lungs. 1 L when not active    [provider]  tiotropium (SPIRIVA) 18 MCG inhalation capsule Place 18 mcg into inhaler and inhale at bedtime.  08/27/16 01/24/19  [provider]  TURMERIC PO Take by mouth.    [provider]    Scheduled Meds:  amiodarone  400 mg Oral BID   apixaban  5 mg Oral BID   diltiazem  180 mg Oral Daily   fluticasone  2 spray Each Nare Daily   guaiFENesin  1,200 mg Oral BID   ipratropium-albuterol  3 mL  Nebulization Q6H   methylPREDNISolone (SOLU-MEDROL) injection  40 mg Intravenous Q24H   milk and molasses  1 enema Rectal Once   multivitamin with minerals  1 tablet Oral Daily   pravastatin  40 mg Oral q1800   senna-docusate  2 tablet Oral BID   Continuous Infusions:  cefTRIAXone (ROCEPHIN)  IV Stopped (11/17/21 1257)    PRN Meds:.albuterol, ALPRAZolam, bisacodyl, morphine injection, naproxen, polyethylene glycol, senna-docusate, sodium chloride, sodium chloride flush    Assessment & Plan:  Acute on chronic hypercapnic hypoxic respiratory failure secondary to AECOPD  PMHx: Asthma, COPD on Chronic home oxygen at 3L, current everyday smoker  -Supplemental oxygen as needed, maintain SpO2 > 88% -Intermittent chest x-ray & ABG PRN -Ensure adequate pulmonary hygiene -reduced steroids to prednisone - no need for antibiotics   Severe constipation     - s/p enema - no stool     - KUB today   Paroxysmal atrial fibrillation Runs of SVT New since admission likely in the setting of AECOPD as above -Continue diltiazem 240 daily for rate control and as BP permits -Continue Eliquis $RemoveBeforeDEI'5mg'NUlFksdNNOnNqpoW$  BID  CHA2DS2VASc  -If uncontrolled with above plan for AAD per cardio recommendations -Replace electrolytes,  Hyperglycemia likely steroid-induced -Check hemoglobin A1c -CBGs -Sliding scale insulin -Follow hyper/hypoglycemia protocol ` Best practice:  Diet:  Oral Pain/Anxiety/Delirium protocol (if indicated): No VAP protocol (if indicated): Not indicated DVT prophylaxis: Systemic AC GI prophylaxis: N/A Glucose control:  SSI Yes Central venous access:  N/A Arterial line:  N/A Foley:  N/A Mobility:  bed rest  PT consulted: Yes Last date of multidisciplinary goals of care discussion [12/4] Code Status:  DNR Disposition: Progressive Care Unit   = Goals of Care = Code Status Order: $RemoveBefor'@CODE'[id number]$ @  Primary Emergency Contact: Rapozo,Jack, Home Phone: [phone] Patient wishes to pursue ongoing  treatment, but concurred that if deteriorated to pulselessness, patient would prefer a natural death as opposed to invasive measures such as CPR and intubation.      Ottie Glazier, M.D.  Pulmonary [facility] [place]

## 2021-11-18 DIAGNOSIS — J441 Chronic obstructive pulmonary disease with (acute) exacerbation: Secondary | ICD-10-CM | POA: Diagnosis not present

## 2021-11-18 DIAGNOSIS — J9621 Acute and chronic respiratory failure with hypoxia: Secondary | ICD-10-CM | POA: Diagnosis not present

## 2021-11-18 MED ORDER — LACTULOSE 10 GM/15ML PO SOLN
20.0000 g | Freq: Three times a day (TID) | ORAL | Status: AC
  Administered 2021-11-18 – 2021-11-19 (×3): 20 g via ORAL
  Filled 2021-11-18 (×3): qty 30

## 2021-11-18 NOTE — Consult Note (Signed)
NAME:  Margaret Stevenson, MRN:  [id number], DOB:  1946/06/16, LOS: 33 ADMISSION DATE:  11/05/2021, CONSULTATION DATE: 11/11/2021 REFERRING MD: Irine Seal, MD, CHIEF COMPLAINT: Persistent hypoxia  Brief Patient Report  75 y.o. female w/ a h/o COPD and tob abuse, who was admitted 11/28 w/ resp failure and COPD w/ finding of PAF. HPI  75 year old female with past medical history of COPD, previous melanoma and pulmonary nodules who was admitted on 11/05/2021 with difficulty breathing.  CT scan of the chest on 11/03/2021 after an ER visit that was negative for pulmonary embolism and showed stable pulmonary nodules. She presented back to the ED with worsening respiratory symptoms.  Hospital Course: During the course of admission patient developed episode of SVT with rates as high as 180s.  Valsalva maneuver and carotid massage performed with improvement.  Adenosine was considered but rhythm improved prior to being administered.  Patient later given IV Cardizem push with improvement.  Cardiology consulted patient started on Eliquis and p.o. Cardizem 30 mg every 6 hours with plans to switch to CD before DC.  Patient continued to require increased oxygenation from 89 L HFNC O2.  Weaning attempts was made to 6 L but patient desatted to low 80s at rest.  This morning patient continues to complain of shortness of breath states she is satting in the 90s but feels like she is satting in the 80s.  Due to increased oxygen requirement with persistent hypoxia PCCM consulted to assist with management.  11/12/21- family including son is at bedside, patient on HFNC 54/50.  Patient is slow to answer but lucid. 11/13/21 - Patient is continuing to require HFNC she has improved to 49/50.  She is overall with poor prognosis. We met together with palliative care.  We discussed hospice.  12.8/22- patient remains on HFNC. She is hopeful to improve. She is followed by palliative which sees reasonable.  11/16/21- patient seems to  have worsened and increased O2 requireent now >65% / 50L/min and patient is using Metaneb.  Repeat CXR this am.   Yesterday she tried OOB and got up for 1 min only prior to back to bed.  "Cant even roll over too weak".  KUB this am too.  11/17/21- patient wants to continue to work to improve and get better. She wants to know what is her prognosis. Overall patient has poor prognosis.  12//11/22- patient had better night last night and trying with PT to get OOB.   Son was at bedside today and we reviewed medical plan. Patient has HFNC 78/50L.    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   Elevated CA 19-9 level 05/08/2017   Arthritis 05/07/2017   Asthma without status asthmaticus 05/07/2017   Borderline diabetes 05/07/2017   Hx of adenomatous colonic polyps 05/07/2017   Tobacco dependence 05/07/2017   Sepsis ([place]) 04/27/2017   Disease of biliary tract     Obstruction of bile duct     Jaundice     Hypoxia 10/11/2016   Anxiety 10/11/2016   COPD (chronic obstructive  pulmonary disease) ([place]) 10/06/2016   CAP (community acquired pneumonia) 10/06/2016   Hyperglycemia 10/06/2016   Vaccine counseling 06/21/2016   History of malignant melanoma of skin 08/03/2013    Significant Hospital Events   11/28: Admitted to progressive care unit with acute on chronic respiratory failure secondary to COPD exacerbation/anxiety 11/30: Continue to wean oxygen to 5 L/min. Cardiology consulted for Afib with RVR 12/1: Patient with increased oxygen requirement 8 to 9 L HFNC failed weaning. 12/2: Remains on high flow nasal  cannula at 8 to 9 L to keep sats greater than 90% 12/3: Still requiring high oxygen also in A. fib with RVR 12/4:  PCCM consulted Consults:  Cardiology PCCM  Procedures:  None  Significant Diagnostic Tests:  11/28: Chest Xray> no acute cardiopulmonary process, emphysema 12/4: 7 mm right upper lobe pulmonary nodule, better demonstrated on recent chest CT. 2. Peribronchial opacities with lower lobe predominance may represent bronchitic changes or interstitial pulmonary edema. 3. Emphysema  Micro Data:  11/28: SARS-CoV-2 PCR> negative 11/28: Influenza PCR> negative  Antimicrobials:  None  OBJECTIVE  Blood pressure (!) 143/64, pulse (!) 113, temperature 98 F (36.7 C), temperature source Oral, resp. rate 18, height 5' 6.5" (1.689 m), weight 54 kg, SpO2 91 %.    FiO2 (%):  [62 %-70 %] 62 %   Intake/Output Summary (Last 24 hours) at 11/18/2021 1438 Last data filed at 11/18/2021 1412 Gross per 24 hour  Intake 579.24 ml  Output 800 ml  Net -220.76 ml    Filed Weights   11/17/21 1200  Weight: 54 kg    Physical Examination  GENERAL: 75 year-old patient lying in the bed with no acute distress.  EYES: Pupils equal, round, reactive to light and accommodation. No scleral icterus. Extraocular muscles intact.  HEENT: Head atraumatic, normocephalic. Oropharynx and nasopharynx clear.  NECK:  Supple, no jugular venous distention. No thyroid enlargement, no tenderness.  LUNGS: Decreased breath sounds bilaterally, no wheezing, rales,rhonchi or crepitation. No use of accessory muscles of respiration.  CARDIOVASCULAR: S1, S2 normal. No murmurs, rubs, or gallops.  ABDOMEN: Soft, nontender, nondistended. Bowel sounds present. No organomegaly or mass.  EXTREMITIES: No pedal edema, cyanosis, or clubbing.  NEUROLOGIC: Cranial nerves II through XII are intact.  Muscle strength 5/5 in all extremities. Sensation intact. Gait not checked.  PSYCHIATRIC: The patient is alert and oriented x 3.   SKIN: No obvious rash, lesion, or ulcer.   Labs/imaging that I havepersonally reviewed  (right click and "Reselect all SmartList Selections" daily)     Labs   CBC: Recent Labs  Lab 11/12/21 0409 11/13/21 0415 11/14/21 0350 11/17/21 1247  WBC 15.9* 17.1* 19.4* 23.7*  NEUTROABS 14.8*  --   --   --   HGB 13.7 15.2* 14.0 13.6  HCT 43.5 50.0* 44.7 42.9  MCV 100.9* 101.0* 100.9* 98.2  PLT 215 226 207 254     Basic Metabolic Panel: Recent Labs  Lab 11/12/21 0409 11/13/21 0415 11/14/21 0350  NA 138 140 137  K 4.4 4.4 4.0  CL 91* 92* 94*  CO2 40* 41* 39*  GLUCOSE 143* 149* 133*  BUN 19 26* 23  CREATININE 0.33* 0.34* 0.34*  CALCIUM 8.6* 9.0 8.3*  MG 2.5*  --   --   PHOS 4.7*  --   --     GFR: Estimated Creatinine Clearance: 52.6 mL/min (A) (by C-G formula based on SCr of 0.34 mg/dL (L)). Recent Labs  Lab 11/12/21 0409 11/13/21 0415 11/14/21 0350 11/17/21 1247  WBC 15.9* 17.1* 19.4* 23.7*     Liver Function Tests: No results for input(s): AST, ALT, ALKPHOS, BILITOT, PROT, ALBUMIN in the last 168 hours. No results for input(s): LIPASE, AMYLASE in the last 168 hours. No results for input(s): AMMONIA in the last 168 hours.  ABG    Component Value Date/Time   PHART 7.44 11/12/2021 0847   PCO2ART 68 (HH) 11/12/2021 0847   PO2ART 53 (L) 11/12/2021 0847   HCO3 46.2 (H) 11/12/2021 0847   O2SAT 88.3 11/12/2021 0847     Coagulation Profile: No results for input(s): INR, PROTIME in the last 168 hours.  Cardiac Enzymes: No results for input(s): CKTOTAL, CKMB, CKMBINDEX, TROPONINI in the last 168 hours.  HbA1C: Hgb A1c MFr Bld  Date/Time Value Ref Range Status  04/27/2017 12:17 AM 4.5 (L) 4.8 - 5.6 % Final    Comment:    (NOTE)         Pre-diabetes: 5.7 - 6.4         Diabetes: >6.4         Glycemic control for adults with diabetes: <7.0     CBG: No results for input(s): GLUCAP in the last 168 hours.  Review of Systems:   Review of Systems   Constitutional: Negative.   HENT: Negative.    Eyes: Negative.   Respiratory:  Positive for cough, sputum production, shortness of breath and wheezing. Negative for hemoptysis.   Cardiovascular:  Positive for palpitations, orthopnea and leg swelling. Negative for chest pain, claudication and PND.  Gastrointestinal: Negative.   Genitourinary: Negative.   Musculoskeletal:  Positive for back pain and neck pain.  Skin: Negative.   Neurological: Negative.   Endo/Heme/Allergies: Negative.   Psychiatric/Behavioral:  The patient is nervous/anxious.     Past Medical History  She,  has a past medical history of Arthritis, Cancer ([place]), COPD (chronic obstructive pulmonary disease) ([place]), Dyspnea, and Jaundice (04/2017).   Surgical History    Past Surgical History:  Procedure Laterality Date   BREAST BIOPSY Right 08/26/2018   Affirm Bx ("x" clip) path pending   BUNIONECTOMY     COLONOSCOPY     COLONOSCOPY WITH PROPOFOL N/A 05/26/2017   Procedure: COLONOSCOPY WITH PROPOFOL;  Surgeon: Lucilla Lame, MD;  Location: [facility];  Service: Endoscopy;  Laterality: N/A;  please do not move appt time   ERCP N/A 04/25/2017   Procedure: ENDOSCOPIC RETROGRADE CHOLANGIOPANCREATOGRAPHY (ERCP);  Surgeon: Lucilla Lame, MD;  Location: [facility] ENDOSCOPY;  Service: Endoscopy;  Laterality: N/A;   ERCP N/A 09/30/2017   Procedure: ENDOSCOPIC RETROGRADE CHOLANGIOPANCREATOGRAPHY (ERCP) Pancreatic stent removal;  Surgeon: Lucilla Lame, MD;  [facility];  Service: Endoscopy;  Laterality: N/A;   POLYPECTOMY  05/26/2017   Procedure: POLYPECTOMY;  Surgeon: Lucilla Lame, MD;  Location: [facility];  Service: Endoscopy;;   TONSILLECTOMY       Social History   reports that she quit smoking about 2 weeks ago. Her smoking use included cigarettes. She has a 28.50 pack-year smoking history. She has never used smokeless tobacco. She reports current alcohol use of about 7.0 standard drinks per week. She  reports that she does not use drugs.   Family History   Her family history includes Breast cancer (age of onset: 72) in her mother.   Allergies Allergies  Allergen Reactions   Penicillins Hives    Has patient had a PCN reaction causing immediate rash, facial/tongue/throat swelling, SOB or lightheadedness with hypotension: no Has patient had a PCN reaction causing severe  rash involving mucus membranes or skin necrosis: no Has patient had a PCN reaction that required hospitalization no Has patient had a PCN reaction occurring within the last 10 years: no If all of the above answers are "NO", then may proceed with Cephalosporin use.      Home Medications  Prior to Admission medications   Medication Sig Start Date End Date Taking? Authorizing Provider  albuterol (PROVENTIL) (2.5 MG/3ML) 0.083% nebulizer solution Take 3 mLs (2.5 mg total) by nebulization every 4 (four) hours as needed for wheezing or shortness of breath. 11/03/21 11/03/22  Naaman Plummer, MD  albuterol (VENTOLIN HFA) 108 (90 Base) MCG/ACT inhaler Inhale 2 puffs into the lungs every 4 (four) hours as needed for shortness of breath.  08/27/16 01/24/19  [provider]  aspirin EC 81 MG tablet Take 81 mg by mouth daily. 10/11/21   [provider]  Fluticasone-Salmeterol (ADVAIR) 250-50 MCG/DOSE AEPB Inhale 1 puff into the lungs 2 (two) times daily.    [provider]  lovastatin (MEVACOR) 40 MG tablet Take 40 mg by mouth daily with supper. 10/11/21   [provider]  OXYGEN Inhale 3 L into the lungs. 1 L when not active    [provider]  tiotropium (SPIRIVA) 18 MCG inhalation capsule Place 18 mcg into inhaler and inhale at bedtime.  08/27/16 01/24/19  [provider]  TURMERIC PO Take by mouth.    [provider]    Scheduled Meds:  amiodarone  400 mg Oral BID   apixaban  5 mg Oral BID   diltiazem  180 mg Oral Daily   fluticasone  2 spray Each Nare Daily    guaiFENesin  1,200 mg Oral BID   ipratropium-albuterol  3 mL Nebulization Q6H   milk and molasses  1 enema Rectal Once   multivitamin with minerals  1 tablet Oral Daily   pravastatin  40 mg Oral q1800   senna-docusate  2 tablet Oral BID   Continuous Infusions:    PRN Meds:.albuterol, ALPRAZolam, bisacodyl, morphine injection, naproxen, polyethylene glycol, senna-docusate, sodium chloride, sodium chloride flush    Assessment & Plan:  Acute on chronic hypercapnic hypoxic respiratory failure secondary to AECOPD  PMHx: Asthma, COPD on Chronic home oxygen at 3L, current everyday smoker  -Supplemental oxygen as needed, maintain SpO2 > 88% -Intermittent chest x-ray & ABG PRN -Ensure adequate pulmonary hygiene -reduced steroids to prednisone - no need for antibiotics   Severe constipation     - s/p enema - no stool     - KUB today   Paroxysmal atrial fibrillation Runs of SVT New since admission likely in the setting of AECOPD as above -Continue diltiazem 240 daily for rate control and as BP permits -Continue Eliquis 94m BID  CHA2DS2VASc  -If uncontrolled with above plan for AAD per cardio recommendations -Replace electrolytes,  Hyperglycemia likely steroid-induced -Check hemoglobin A1c -CBGs -Sliding scale insulin -Follow hyper/hypoglycemia protocol ` Best practice:  Diet:  Oral Pain/Anxiety/Delirium protocol (if indicated): No VAP protocol (if indicated): Not indicated DVT prophylaxis: Systemic AC GI prophylaxis: N/A Glucose control:  SSI Yes Central venous access:  N/A Arterial line:  N/A Foley:  N/A Mobility:  bed rest  PT consulted: Yes Last date of multidisciplinary goals of care discussion [12/4] Code Status:  DNR Disposition: Progressive Care Unit   = Goals of Care = Code Status Order: _0 @   Primary Emergency Contact:Jamea, Robicheaux Home Phone: [phone] wishes to pursue ongoing treatment, but concurred that if  deteriorated to pulselessness,  patient would prefer a natural death as opposed to invasive measures such as CPR and intubation.      Ottie Glazier, M.D.  [facility] [place]

## 2021-11-18 NOTE — Progress Notes (Addendum)
Progress Note    Margaret Stevenson  NFA:[id number] DOB: 1946-11-20  DOA: 11/05/2021 PCP: Dion Body, MD      Brief Narrative:    Medical records reviewed and are as summarized below:  Margaret Stevenson is a 75 y.o. female with medical history significant for COPD, chronic hypoxemic respiratory failure, pulmonary nodules, history of melanoma, who presented to the hospital because of difficulty breathing.      Assessment/Plan:   Principal Problem:   COPD with acute exacerbation (HCC) Active Problems:   Tobacco dependence   Acute on chronic respiratory failure with hypoxia (HCC)   Paroxysmal atrial fibrillation (HCC)   Malnutrition of moderate degree   Hypokalemia   Nutrition Problem: Moderate Malnutrition Etiology: chronic illness (COPD)  Signs/Symptoms: mild fat depletion, moderate fat depletion, mild muscle depletion, moderate muscle depletion    Acute on chronic hypoxemic respiratory failure: She is on oxygen via heated humidified HFNC at 65% at 45 L/min.  Difficulty to taper down  oxygen.  COPD exacerbation: Chest x-ray on 11/16/2021 showed increased interstitial markings.  Continue steroids and bronchodilators.  IV ceftriaxone was discontinued by the pulmonologist.  Continue chest physiotherapy.  Follow-up with pulmonologist for further recommendations.  Completed 5 days of azithromycin.  Leukocytosis: This is likely from steroids.  Paroxysmal atrial fibrillation with RVR: Telemetry showed she has gone into A. fib again with some elevation in heart rate.  Continue diltiazem, amiodarone and Eliquis.  Hypokalemia: Improved  Constipation: Continue laxatives.  Added lactulose.  Abdominal x-ray on 11/16/2021 showed large amount of stool in the right colon.  Pulmonary nodules: PET scan in April 2022 suggestive of benign etiology.  Outpatient follow-up with CT scan for continued surveillance recommended.  Tobacco use disorder: Counseled to quit  smoking.  Follow-up with palliative care  Diet Order             Diet regular Room service appropriate? Yes; Fluid consistency: Thin  Diet effective now                      Consultants: Pulmonologist Intensivist Palliative care  Procedures: None    Medications:    amiodarone  400 mg Oral BID   apixaban  5 mg Oral BID   diltiazem  180 mg Oral Daily   fluticasone  2 spray Each Nare Daily   guaiFENesin  1,200 mg Oral BID   ipratropium-albuterol  3 mL Nebulization Q6H   lactulose  20 g Oral TID   milk and molasses  1 enema Rectal Once   multivitamin with minerals  1 tablet Oral Daily   pravastatin  40 mg Oral q1800   senna-docusate  2 tablet Oral BID   Continuous Infusions:     Anti-infectives (From admission, onward)    Start     Dose/Rate Route Frequency Ordered Stop   11/17/21 1100  cefTRIAXone (ROCEPHIN) 1 g in sodium chloride 0.9 % 100 mL IVPB  Status:  Discontinued        1 g 200 mL/hr over 30 Minutes Intravenous Every 24 hours 11/17/21 0928 11/17/21 1818   11/07/21 2000  azithromycin (ZITHROMAX) 500 mg in sodium chloride 0.9 % 250 mL IVPB        500 mg 250 mL/hr over 60 Minutes Intravenous Every 24 hours 11/07/21 1926 11/11/21 2105              Family Communication/Anticipated D/C date and plan/Code Status   DVT prophylaxis:  apixaban (ELIQUIS) tablet 5  mg     Code Status: DNR  Family Communication: None Disposition Plan: Plan to discharge to SNF   Status is: Inpatient  Remains inpatient appropriate because: Hypoxemic on oxygen via heated humidified HFNC           Subjective:   C/o cough and difficulty breathing.  She still has constipation.   Objective:    Vitals:   11/18/21 0400 11/18/21 0823 11/18/21 1111 11/18/21 1119  BP: (!) 153/67 127/62 (!) 143/64   Pulse: 77 85 (!) 113   Resp: 20 19 18    Temp:  97.9 F (36.6 C) 98 F (36.7 C)   TempSrc:  Oral Oral   SpO2: 95% 98% 90% 91%  Weight:      Height:        No data found.   Intake/Output Summary (Last 24 hours) at 11/18/2021 1452 Last data filed at 11/18/2021 1412 Gross per 24 hour  Intake 579.24 ml  Output 800 ml  Net -220.76 ml   Filed Weights   11/17/21 1200  Weight: 54 kg    Exam:  GEN: NAD SKIN: No rash EYES: EOMI ENT: MMM CV: Irregular rate and rhythm, tachycardic PULM: Decreased air entry bilaterally.  No wheezing or rales heard ABD: soft, ND, NT, +BS CNS: AAO x 3, non focal EXT: No edema or tenderness             Data Reviewed:   I have personally reviewed following labs and imaging studies:  Labs: Labs show the following:   Basic Metabolic Panel: Recent Labs  Lab 11/12/21 0409 11/13/21 0415 11/14/21 0350  NA 138 140 137  K 4.4 4.4 4.0  CL 91* 92* 94*  CO2 40* 41* 39*  GLUCOSE 143* 149* 133*  BUN 19 26* 23  CREATININE 0.33* 0.34* 0.34*  CALCIUM 8.6* 9.0 8.3*  MG 2.5*  --   --   PHOS 4.7*  --   --    GFR Estimated Creatinine Clearance: 52.6 mL/min (A) (by C-G formula based on SCr of 0.34 mg/dL (L)). Liver Function Tests: No results for input(s): AST, ALT, ALKPHOS, BILITOT, PROT, ALBUMIN in the last 168 hours. No results for input(s): LIPASE, AMYLASE in the last 168 hours. No results for input(s): AMMONIA in the last 168 hours. Coagulation profile No results for input(s): INR, PROTIME in the last 168 hours.  CBC: Recent Labs  Lab 11/12/21 0409 11/13/21 0415 11/14/21 0350 11/17/21 1247  WBC 15.9* 17.1* 19.4* 23.7*  NEUTROABS 14.8*  --   --   --   HGB 13.7 15.2* 14.0 13.6  HCT 43.5 50.0* 44.7 42.9  MCV 100.9* 101.0* 100.9* 98.2  PLT 215 226 207 254   Cardiac Enzymes: No results for input(s): CKTOTAL, CKMB, CKMBINDEX, TROPONINI in the last 168 hours. BNP (last 3 results) No results for input(s): PROBNP in the last 8760 hours. CBG: No results for input(s): GLUCAP in the last 168 hours. D-Dimer: No results for input(s): DDIMER in the last 72 hours. Hgb A1c: No results  for input(s): HGBA1C in the last 72 hours. Lipid Profile: No results for input(s): CHOL, HDL, LDLCALC, TRIG, CHOLHDL, LDLDIRECT in the last 72 hours. Thyroid function studies: No results for input(s): TSH, T4TOTAL, T3FREE, THYROIDAB in the last 72 hours.  Invalid input(s): FREET3  Anemia work up: No results for input(s): VITAMINB12, FOLATE, FERRITIN, TIBC, IRON, RETICCTPCT in the last 72 hours. Sepsis Labs: Recent Labs  Lab 11/12/21 0409 11/13/21 0415 11/14/21 0350 11/17/21 1247  WBC 15.9*  17.1* 19.4* 23.7*    Microbiology No results found for this or any previous visit (from the past 240 hour(s)).   Procedures and diagnostic studies:  No results found.             LOS: 13 days   [place] Copywriter, advertising on [email or web address]. If 7PM-7AM, please contact night-coverage at [email or web address]     11/18/2021, 2:52 PM

## 2021-11-19 ENCOUNTER — Telehealth: Payer: Self-pay | Admitting: *Deleted

## 2021-11-19 DIAGNOSIS — J441 Chronic obstructive pulmonary disease with (acute) exacerbation: Secondary | ICD-10-CM | POA: Diagnosis not present

## 2021-11-19 DIAGNOSIS — Z7189 Other specified counseling: Secondary | ICD-10-CM | POA: Diagnosis not present

## 2021-11-19 DIAGNOSIS — K5900 Constipation, unspecified: Secondary | ICD-10-CM | POA: Diagnosis not present

## 2021-11-19 DIAGNOSIS — Z515 Encounter for palliative care: Secondary | ICD-10-CM | POA: Diagnosis not present

## 2021-11-19 LAB — BASIC METABOLIC PANEL
Anion gap: 5 (ref 5–15)
BUN: 19 mg/dL (ref 8–23)
CO2: 40 mmol/L — ABNORMAL HIGH (ref 22–32)
Calcium: 8.1 mg/dL — ABNORMAL LOW (ref 8.9–10.3)
Chloride: 93 mmol/L — ABNORMAL LOW (ref 98–111)
Creatinine, Ser: <0.3 mg/dL — ABNORMAL LOW (ref 0.44–1.00)
Glucose, Bld: 102 mg/dL — ABNORMAL HIGH (ref 70–99)
Potassium: 4.2 mmol/L (ref 3.5–5.1)
Sodium: 138 mmol/L (ref 135–145)

## 2021-11-19 LAB — CBC WITH DIFFERENTIAL/PLATELET
Abs Immature Granulocytes: 0.14 10*3/uL — ABNORMAL HIGH (ref 0.00–0.07)
Basophils Absolute: 0 10*3/uL (ref 0.0–0.1)
Basophils Relative: 0 %
Eosinophils Absolute: 0.1 10*3/uL (ref 0.0–0.5)
Eosinophils Relative: 0 %
HCT: 45.1 % (ref 36.0–46.0)
Hemoglobin: 13.9 g/dL (ref 12.0–15.0)
Immature Granulocytes: 1 %
Lymphocytes Relative: 9 %
Lymphs Abs: 1.5 10*3/uL (ref 0.7–4.0)
MCH: 31.1 pg (ref 26.0–34.0)
MCHC: 30.8 g/dL (ref 30.0–36.0)
MCV: 100.9 fL — ABNORMAL HIGH (ref 80.0–100.0)
Monocytes Absolute: 1.7 10*3/uL — ABNORMAL HIGH (ref 0.1–1.0)
Monocytes Relative: 10 %
Neutro Abs: 13.6 10*3/uL — ABNORMAL HIGH (ref 1.7–7.7)
Neutrophils Relative %: 80 %
Platelets: 262 10*3/uL (ref 150–400)
RBC: 4.47 MIL/uL (ref 3.87–5.11)
RDW: 13.2 % (ref 11.5–15.5)
WBC: 17.1 10*3/uL — ABNORMAL HIGH (ref 4.0–10.5)
nRBC: 0 % (ref 0.0–0.2)

## 2021-11-19 LAB — MAGNESIUM: Magnesium: 2.3 mg/dL (ref 1.7–2.4)

## 2021-11-19 MED ORDER — PREDNISONE 20 MG PO TABS
20.0000 mg | ORAL_TABLET | Freq: Every day | ORAL | Status: DC
  Administered 2021-11-19 – 2021-11-27 (×9): 20 mg via ORAL
  Filled 2021-11-19 (×9): qty 1

## 2021-11-19 MED ORDER — ORAL CARE MOUTH RINSE
15.0000 mL | Freq: Two times a day (BID) | OROMUCOSAL | Status: DC
  Administered 2021-11-19 – 2021-11-24 (×6): 15 mL via OROMUCOSAL

## 2021-11-19 NOTE — Progress Notes (Signed)
Patient remains in a yellow MEWS d/t high HR and RR.  This is not a change in her baseline.  Routine q4hr vitals will continue.

## 2021-11-19 NOTE — Progress Notes (Signed)
Occupational Therapy Treatment Patient Details Name: Laryah Neuser MRN: [id number] DOB: 1946-06-04 Today's Date: 11/19/2021   History of present illness Pt is a 75 y.o. female with medical history significant for Nicotine dependence and COPD on home oxygen at 3 L who presents to the ED for the second time in 2 days with COPD exacerbation unrelieved with home bronchodilators.  EMS recorded O2 sats in the 80s on her home flow rate and she was placed on CPAP. PMH of COPD, previous melanoma and pulmonary nodules.   OT comments  Pt seen for OT treatment on this date. Upon arrival to room, pt asleep in bed however easily awoken and agreeable to OT tx (with resting HR 94bpm & SpO2 92%). Pt currently requires MIN A for donning socks at bed-level and MIN GUARD for bed mobility d/t decreased strength and activity tolerance. Pt performed x1 sit<>stand transfer, requiring MOD A, and stood for 2 mins, requiring b/l UE support from RW and MIN GUARD; HR 90-100 bpm and SpO2 91-93% during functional mobility. Once seated at EOB, pt reported significant fatigue and declined further mobility attempts/self-care tasks, however verbalized goal to work with PT this afternoon and OT tomorrow. Pt is making good progress toward goals and continues to benefit from skilled OT services to maximize return to PLOF and minimize risk of future falls, injury, caregiver burden, and readmission. Will continue to follow POC. Discharge recommendation remains appropriate.     Recommendations for follow up therapy are one component of a multi-disciplinary discharge planning process, led by the attending physician.  Recommendations may be updated based on patient status, additional functional criteria and insurance authorization.    Follow Up Recommendations  Skilled nursing-short term rehab (<3 hours/day)    Assistance Recommended at Discharge Frequent or constant Supervision/Assistance  Equipment Recommendations  Other (comment)  (defer to next venue of care)       Precautions / Restrictions Precautions Precautions: Fall Precaution Comments: cardiac/respiratory response to activity Restrictions Weight Bearing Restrictions: No       Mobility Bed Mobility Overal bed mobility: Needs Assistance Bed Mobility: Supine to Sit;Sit to Supine     Supine to sit: Min guard;HOB elevated Sit to supine: Min guard;HOB elevated        Transfers Overall transfer level: Needs assistance Equipment used: Rolling walker (2 wheels) Transfers: Sit to/from Stand Sit to Stand: Mod assist           General transfer comment: Requires MOD A for upward momentum, with pt able to maintain balance with CGA     Balance Overall balance assessment: Needs assistance Sitting-balance support: Feet supported;Bilateral upper extremity supported Sitting balance-Leahy Scale: Good Sitting balance - Comments: Good static sitting balance at EOB   Standing balance support: Bilateral upper extremity supported;During functional activity Standing balance-Leahy Scale: Fair Standing balance comment: Able to stand for 2 mins, requiring only min guard                           ADL either performed or assessed with clinical judgement   ADL Overall ADL's : Needs assistance/impaired                     Lower Body Dressing: Minimal assistance;Bed level Lower Body Dressing Details (indicate cue type and reason): to don/doff socks via figure-4 position in bed. Requires MIN A to thread over toes               General  ADL Comments: Pt declining additional ADLs this date      Cognition Arousal/Alertness: Awake/alert Behavior During Therapy: Anxious Overall Cognitive Status: Within Functional Limits for tasks assessed                                 General Comments: Pt is alert and oriented however very frustrated with situation. easily agitated with minimal conversation. was willing to participate with  encouragement and speaking direct/short to her.                General Comments HR 90-100 bpm and SpO2 91-93% during functional mobility    Pertinent Vitals/ Pain       Pain Assessment: No/denies pain         Frequency  Min 2X/week        Progress Toward Goals  OT Goals(current goals can now be found in the care plan section)  Progress towards OT goals: Progressing toward goals  Acute Rehab OT Goals Patient Stated Goal: to be able to walk to bathroom OT Goal Formulation: With patient Time For Goal Achievement: 11/21/21 Potential to Achieve Goals: [place] Discharge plan remains appropriate;Frequency remains appropriate       AM-PAC OT "6 Clicks" Daily Activity     Outcome Measure   Help from another person eating meals?: None Help from another person taking care of personal grooming?: A Little Help from another person toileting, which includes using toliet, bedpan, or urinal?: A Lot Help from another person bathing (including washing, rinsing, drying)?: A Lot Help from another person to put on and taking off regular upper body clothing?: A Little Help from another person to put on and taking off regular lower body clothing?: A Lot 6 Click Score: 16    End of Session Equipment Utilized During Treatment: Rolling walker (2 wheels)  OT Visit Diagnosis: Muscle weakness (generalized) (M62.81);Other abnormalities of gait and mobility (R26.89)   Activity Tolerance Patient tolerated treatment well   Patient Left in bed;with call bell/phone within reach;with bed alarm set   Nurse Communication Mobility status        Time: 8657-8469 OT Time Calculation (min): 23 min  Charges: OT General Charges $OT Visit: 1 Visit OT Treatments $Self Care/Home Management : 8-22 mins $Therapeutic Activity: 8-22 mins  Fredirick Maudlin, OTR/L [place]

## 2021-11-19 NOTE — Progress Notes (Signed)
Palliative: Margaret Stevenson is lying quietly in bed.  She appears acutely/chronically ill and quite frail.  She greets me, making but not keeping eye contact.  She is alert and oriented, able to make her needs known.  There is no family at bedside at this time.    We talked about her acute health concerns, her respiratory status.  She tells me that she is to work with physical therapy later today, but is not sure about her ability.  I encouraged her to do the best she can, and she may just have physical therapy while in the bed.  We also talked about her bowel regimen.  I encouraged her to move is much as possible.  Conference with attending, bedside nursing staff, transition of care team related to patient condition, needs, goals of care, disposition.  Plan: At this point continue to treat the treatable but no CPR or intubation. Prognosis: Guarded at this point.  No meaningful improvement in 14 days.  25 minutes Margaret Axe, NP Palliative medicine team Team phone [phone] Greater than 50% of this time was spent counseling and coordinating care related to the above assessment and plan.

## 2021-11-19 NOTE — Progress Notes (Addendum)
Patient has converted from AFIB to NSR on monitor.  HR in the 80's

## 2021-11-19 NOTE — Progress Notes (Signed)
Physical Therapy Treatment Patient Details Name: Margaret Stevenson MRN: [id number] DOB: Apr 10, 1946 Today's Date: 11/19/2021   History of Present Illness Pt is a 75 y.o. female with medical history significant for Nicotine dependence and COPD on home oxygen at 3 L who presents to the ED for the second time in 2 days with COPD exacerbation unrelieved with home bronchodilators.  EMS recorded O2 sats in the 80s on her home flow rate and she was placed on CPAP. PMH of COPD, previous melanoma and pulmonary nodules.    PT Comments    Pt sleeping in bed upon PT arrival but woken with vc's.  Pt reporting being tired but wanting to participate in therapy.  Mod assist semi-supine to sitting edge of bed.  O2 sats on HFNC briefly decreased to 84% but otherwise 89% or greater during sessions activities (pt prefering O2 sats to stay at 91% or greater).  Pt able to sit edge of bed for about 10 minutes before fatiguing and needing to lay back down again (pt did not feel up to trying to stand but did stand earlier today with OT).  Pt repositioned in bed end of session with all needs in reach.  Will continue to focus on strengthening, endurance, and progressive functional mobility per pt tolerance.   Recommendations for follow up therapy are one component of a multi-disciplinary discharge planning process, led by the attending physician.  Recommendations may be updated based on patient status, additional functional criteria and insurance authorization.  Follow Up Recommendations  Skilled nursing-short term rehab (<3 hours/day)     Assistance Recommended at Discharge Frequent or constant Supervision/Assistance  Equipment Recommendations  Rolling walker (2 wheels);BSC/3in1;Wheelchair (measurements PT);Wheelchair cushion (measurements PT);Hospital bed    Recommendations for Other Services       Precautions / Restrictions Precautions Precautions: Fall Precaution Comments: cardiac/respiratory response to  activity Restrictions Weight Bearing Restrictions: No     Mobility  Bed Mobility Overal bed mobility: Needs Assistance Bed Mobility: Supine to Sit;Sit to Supine     Supine to sit: Min assist;Mod assist;HOB elevated (assist for trunk) Sit to supine: Min assist;Mod assist;HOB elevated (assist for trunk and B LE's)   General bed mobility comments: vc's for technique    Transfers              Ambulation/Gait                   Stairs             Wheelchair Mobility    Modified Rankin (Stroke Patients Only)       Balance Overall balance assessment: Needs assistance Sitting-balance support: Bilateral upper extremity supported;Feet supported Sitting balance-Leahy Scale: Fair Sitting balance - Comments: steady static sitting balance                              Cognition Arousal/Alertness:  (pt sleeping upon PT arrival; woken with vc's) Behavior During Therapy: Anxious Overall Cognitive Status: Within Functional Limits for tasks assessed                                        Exercises General Exercises - Lower Extremity Long Arc Quad: AROM;Strengthening;Both;5 reps;Seated    General Comments       Pertinent Vitals/Pain Pain Assessment: No/denies pain    Home Living  Prior Function            PT Goals (current goals can now be found in the care plan section) Acute Rehab PT Goals Patient Stated Goal: to improve mobility PT Goal Formulation: With patient Time For Goal Achievement: 11/22/21 Potential to Achieve Goals: Poor Progress towards PT goals: Progressing toward goals (strengthening and endurance activities)    Frequency    Min 2X/week      PT Plan Current plan remains appropriate    Co-evaluation              AM-PAC PT "6 Clicks" Mobility   Outcome Measure  Help needed turning from your back to your side while in a flat bed without using bedrails?: A  Little Help needed moving from lying on your back to sitting on the side of a flat bed without using bedrails?: A Lot Help needed moving to and from a bed to a chair (including a wheelchair)?: A Lot Help needed standing up from a chair using your arms (e.g., wheelchair or bedside chair)?: A Lot Help needed to walk in hospital room?: A Lot Help needed climbing 3-5 steps with a railing? : Total 6 Click Score: 12    End of Session Equipment Utilized During Treatment: Oxygen (HFNC) Activity Tolerance: Patient limited by fatigue Patient left: in bed;with call bell/phone within reach;with bed alarm set Nurse Communication: Mobility status;Precautions PT Visit Diagnosis: Other abnormalities of gait and mobility (R26.89);Muscle weakness (generalized) (M62.81)     Time: 9528-4132 PT Time Calculation (min) (ACUTE ONLY): 28 min  Charges:  $Therapeutic Exercise: 8-22 mins $Therapeutic Activity: 8-22 mins                    Leitha Bleak, PT 11/19/21, 4:28 PM

## 2021-11-19 NOTE — Consult Note (Signed)
NAME:  Margaret Stevenson, MRN:  [id number], DOB:  10-Jul-1946, LOS: 66 ADMISSION DATE:  11/05/2021, CONSULTATION DATE: 11/11/2021 REFERRING MD: Irine Seal, MD, CHIEF COMPLAINT: Persistent hypoxia  Brief Patient Report  75 y.o. female w/ a h/o COPD and tob abuse, who was admitted 11/28 w/ resp failure and COPD w/ finding of PAF. HPI  75 year old female with past medical history of COPD, previous melanoma and pulmonary nodules who was admitted on 11/05/2021 with difficulty breathing.  CT scan of the chest on 11/03/2021 after an ER visit that was negative for pulmonary embolism and showed stable pulmonary nodules. She presented back to the ED with worsening respiratory symptoms.  Hospital Course: During the course of admission patient developed episode of SVT with rates as high as 180s.  Valsalva maneuver and carotid massage performed with improvement.  Adenosine was considered but rhythm improved prior to being administered.  Patient later given IV Cardizem push with improvement.  Cardiology consulted patient started on Eliquis and p.o. Cardizem 30 mg every 6 hours with plans to switch to CD before DC.  Patient continued to require increased oxygenation from 89 L HFNC O2.  Weaning attempts was made to 6 L but patient desatted to low 80s at rest.  This morning patient continues to complain of shortness of breath states she is satting in the 90s but feels like she is satting in the 80s.  Due to increased oxygen requirement with persistent hypoxia PCCM consulted to assist with management.  11/12/21- family including son is at bedside, patient on HFNC 54/50.  Patient is slow to answer but lucid. 11/13/21 - Patient is continuing to require HFNC she has improved to 49/50.  She is overall with poor prognosis. We met together with palliative care.  We discussed hospice.  12.8/22- patient remains on HFNC. She is hopeful to improve. She is followed by palliative which sees reasonable.  11/16/21- patient seems to  have worsened and increased O2 requireent now >65% / 50L/min and patient is using Metaneb.  Repeat CXR this am.   Yesterday she tried OOB and got up for 1 min only prior to back to bed.  "Cant even roll over too weak".  KUB this am too.  11/17/21- patient wants to continue to work to improve and get better. She wants to know what is her prognosis. Overall patient has poor prognosis.  12//11/22- patient had better night last night and trying with PT to get OOB.   Son was at bedside today and we reviewed medical plan. Patient has HFNC 78/50L. 11/19/21- patient continues to require elevated settings on HFNC. She felt better today after having bath with staff but still is long way from going home which is what she wants.  Continuing prednisone at $RemoveBefor'20mg'ztiHbdKLCdgp$  daily.     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   Elevated CA 19-9 level 05/08/2017   Arthritis 05/07/2017   Asthma without status asthmaticus 05/07/2017   Borderline diabetes 05/07/2017   Hx of adenomatous colonic polyps 05/07/2017   Tobacco dependence 05/07/2017  Sepsis ([place]) 04/27/2017   Disease of biliary tract     Obstruction of bile duct     Jaundice     Hypoxia 10/11/2016   Anxiety 10/11/2016   COPD (chronic obstructive pulmonary disease) ([place]) 10/06/2016   CAP (community acquired pneumonia) 10/06/2016   Hyperglycemia 10/06/2016   Vaccine counseling 06/21/2016   History of malignant melanoma of skin 08/03/2013    Significant Hospital Events   11/28: Admitted to progressive care unit with acute on chronic respiratory failure secondary to COPD  exacerbation/anxiety 11/30: Continue to wean oxygen to 5 L/min. Cardiology consulted for Afib with RVR 12/1: Patient with increased oxygen requirement 8 to 9 L HFNC failed weaning. 12/2: Remains on high flow nasal cannula at 8 to 9 L to keep sats greater than 90% 12/3: Still requiring high oxygen also in A. fib with RVR 12/4:  PCCM consulted Consults:  Cardiology PCCM  Procedures:  None  Significant Diagnostic Tests:  11/28: Chest Xray> no acute cardiopulmonary process, emphysema 12/4: 7 mm right upper lobe pulmonary nodule, better demonstrated on recent chest CT. 2. Peribronchial opacities with lower lobe predominance may represent bronchitic changes or interstitial pulmonary edema. 3. Emphysema  Micro Data:  11/28: SARS-CoV-2 PCR> negative 11/28: Influenza PCR> negative  Antimicrobials:  None  OBJECTIVE  Blood pressure (!) 141/65, pulse 93, temperature 98.3 F (36.8 C), resp. rate 18, height 5' 6.5" (1.689 m), weight 54 kg, SpO2 95 %.    FiO2 (%):  [75 %-90 %] 88 %   Intake/Output Summary (Last 24 hours) at 11/19/2021 1217 Last data filed at 11/19/2021 0900 Gross per 24 hour  Intake 240 ml  Output --  Net 240 ml    Filed Weights   11/17/21 1200  Weight: 54 kg    Physical Examination  GENERAL: 75 year-old patient lying in the bed with no acute distress.  EYES: Pupils equal, round, reactive to light and accommodation. No scleral icterus. Extraocular muscles intact.  HEENT: Head atraumatic, normocephalic. Oropharynx and nasopharynx clear.  NECK:  Supple, no jugular venous distention. No thyroid enlargement, no tenderness.  LUNGS: Decreased breath sounds bilaterally, no wheezing, rales,rhonchi or crepitation. No use of accessory muscles of respiration.  CARDIOVASCULAR: S1, S2 normal. No murmurs, rubs, or gallops.  ABDOMEN: Soft, nontender, nondistended. Bowel sounds present. No organomegaly or mass.  EXTREMITIES: No pedal edema, cyanosis, or clubbing.  NEUROLOGIC:  Cranial nerves II through XII are intact.  Muscle strength 5/5 in all extremities. Sensation intact. Gait not checked.  PSYCHIATRIC: The patient is alert and oriented x 3.  SKIN: No obvious rash, lesion, or ulcer.   Labs/imaging that I havepersonally reviewed  (right click and "Reselect all SmartList Selections" daily)     Labs   CBC: Recent Labs  Lab 11/13/21 0415 11/14/21 0350 11/17/21 1247 11/19/21 0805  WBC 17.1* 19.4* 23.7* 17.1*  NEUTROABS  --   --   --  13.6*  HGB 15.2* 14.0 13.6 13.9  HCT 50.0* 44.7 42.9 45.1  MCV 101.0* 100.9* 98.2 100.9*  PLT 226 207 254 262     Basic Metabolic Panel: Recent Labs  Lab 11/13/21 0415 11/14/21 0350 11/19/21 0805  NA 140 137 138  K 4.4 4.0 4.2  CL 92* 94* 93*  CO2 41* 39* 40*  GLUCOSE 149* 133* 102*  BUN 26* 23 19  CREATININE 0.34* 0.34* <0.30*  CALCIUM 9.0 8.3* 8.1*  MG  --   --  2.3    GFR: CrCl cannot be calculated (This lab  value cannot be used to calculate CrCl because it is not a number: <0.30). Recent Labs  Lab 11/13/21 0415 11/14/21 0350 11/17/21 1247 11/19/21 0805  WBC 17.1* 19.4* 23.7* 17.1*     Liver Function Tests: No results for input(s): AST, ALT, ALKPHOS, BILITOT, PROT, ALBUMIN in the last 168 hours. No results for input(s): LIPASE, AMYLASE in the last 168 hours. No results for input(s): AMMONIA in the last 168 hours.  ABG    Component Value Date/Time   PHART 7.44 11/12/2021 0847   PCO2ART 68 (HH) 11/12/2021 0847   PO2ART 53 (L) 11/12/2021 0847   HCO3 46.2 (H) 11/12/2021 0847   O2SAT 88.3 11/12/2021 0847     Coagulation Profile: No results for input(s): INR, PROTIME in the last 168 hours.  Cardiac Enzymes: No results for input(s): CKTOTAL, CKMB, CKMBINDEX, TROPONINI in the last 168 hours.  HbA1C: Hgb A1c MFr Bld  Date/Time Value Ref Range Status  04/27/2017 12:17 AM 4.5 (L) 4.8 - 5.6 % Final    Comment:    (NOTE)         Pre-diabetes: 5.7 - 6.4         Diabetes: >6.4          Glycemic control for adults with diabetes: <7.0     CBG: No results for input(s): GLUCAP in the last 168 hours.  Review of Systems:   Review of Systems  Constitutional: Negative.   HENT: Negative.    Eyes: Negative.   Respiratory:  Positive for cough, sputum production, shortness of breath and wheezing. Negative for hemoptysis.   Cardiovascular:  Positive for palpitations, orthopnea and leg swelling. Negative for chest pain, claudication and PND.  Gastrointestinal: Negative.   Genitourinary: Negative.   Musculoskeletal:  Positive for back pain and neck pain.  Skin: Negative.   Neurological: Negative.   Endo/Heme/Allergies: Negative.   Psychiatric/Behavioral:  The patient is nervous/anxious.     Past Medical History  She,  has a past medical history of Arthritis, Cancer ([place]), COPD (chronic obstructive pulmonary disease) ([place]), Dyspnea, and Jaundice (04/2017).   Surgical History    Past Surgical History:  Procedure Laterality Date   BREAST BIOPSY Right 08/26/2018   Affirm Bx ("x" clip) path pending   BUNIONECTOMY     COLONOSCOPY     COLONOSCOPY WITH PROPOFOL N/A 05/26/2017   Procedure: COLONOSCOPY WITH PROPOFOL;  Surgeon: Lucilla Lame, MD;  Location: [facility];  Service: Endoscopy;  Laterality: N/A;  please do not move appt time   ERCP N/A 04/25/2017   Procedure: ENDOSCOPIC RETROGRADE CHOLANGIOPANCREATOGRAPHY (ERCP);  Surgeon: Lucilla Lame, MD;  Location: [facility] ENDOSCOPY;  Service: Endoscopy;  Laterality: N/A;   ERCP N/A 09/30/2017   Procedure: ENDOSCOPIC RETROGRADE CHOLANGIOPANCREATOGRAPHY (ERCP) Pancreatic stent removal;  Surgeon: Lucilla Lame, MD;  Location: [facility] ENDOSCOPY;  Service: Endoscopy;  Laterality: N/A;   POLYPECTOMY  05/26/2017   Procedure: POLYPECTOMY;  Surgeon: Lucilla Lame, MD;  Location: [place] [facility];  Service: Endoscopy;;   TONSILLECTOMY       Social History   reports that she quit smoking about 2 weeks ago. Her smoking use included  cigarettes. She has a 28.50 pack-year smoking history. She has never used smokeless tobacco. She reports current alcohol use of about 7.0 standard drinks per week. She reports that she does not use drugs.   Family History   Her family history includes Breast cancer (age of onset: 33) in her mother.   Allergies Allergies  Allergen Reactions   Penicillins Hives  Has patient had a PCN reaction causing immediate rash, facial/tongue/throat swelling, SOB or lightheadedness with hypotension: no Has patient had a PCN reaction causing severe rash involving mucus membranes or skin necrosis: no Has patient had a PCN reaction that required hospitalization no Has patient had a PCN reaction occurring within the last 10 years: no If all of the above answers are "NO", then may proceed with Cephalosporin use.      Home Medications  Prior to Admission medications   Medication Sig Start Date End Date Taking? Authorizing Provider  albuterol (PROVENTIL) (2.5 MG/3ML) 0.083% nebulizer solution Take 3 mLs (2.5 mg total) by nebulization every 4 (four) hours as needed for wheezing or shortness of breath. 11/03/21 11/03/22  Naaman Plummer, MD  albuterol (VENTOLIN HFA) 108 (90 Base) MCG/ACT inhaler Inhale 2 puffs into the lungs every 4 (four) hours as needed for shortness of breath.  08/27/16 01/24/19  [provider]  aspirin EC 81 MG tablet Take 81 mg by mouth daily. 10/11/21   [provider]  Fluticasone-Salmeterol (ADVAIR) 250-50 MCG/DOSE AEPB Inhale 1 puff into the lungs 2 (two) times daily.    [provider]  lovastatin (MEVACOR) 40 MG tablet Take 40 mg by mouth daily with supper. 10/11/21   [provider]  OXYGEN Inhale 3 L into the lungs. 1 L when not active    [provider]  tiotropium (SPIRIVA) 18 MCG inhalation capsule Place 18 mcg into inhaler and inhale at bedtime.  08/27/16 01/24/19  [provider]  TURMERIC PO Take by mouth.    [provider]    Scheduled Meds:  amiodarone  400 mg Oral BID   apixaban  5 mg Oral BID   diltiazem  180 mg Oral Daily   fluticasone  2 spray Each Nare Daily   guaiFENesin  1,200 mg Oral BID   ipratropium-albuterol  3 mL Nebulization Q6H   lactulose  20 g Oral TID   mouth rinse  15 mL Mouth Rinse BID   milk and molasses  1 enema Rectal Once   multivitamin with minerals  1 tablet Oral Daily   pravastatin  40 mg Oral q1800   senna-docusate  2 tablet Oral BID   Continuous Infusions:    PRN Meds:.albuterol, ALPRAZolam, bisacodyl, morphine injection, naproxen, polyethylene glycol, senna-docusate, sodium chloride, sodium chloride flush    Assessment & Plan:  Acute on chronic hypercapnic hypoxic respiratory failure secondary to AECOPD  PMHx: Asthma, COPD on Chronic home oxygen at 3L, current everyday smoker  -Supplemental oxygen as needed, maintain SpO2 > 88% -Intermittent chest x-ray & ABG PRN -Ensure adequate pulmonary hygiene -reduced steroids to prednisone - no need for antibiotics   Severe constipation     - s/p enema - no stool     - KUB today   Paroxysmal atrial fibrillation Runs of SVT New since admission likely in the setting of AECOPD as above -Continue diltiazem 240 daily for rate control and as BP permits -Continue Eliquis $RemoveBeforeDEI'5mg'gwWxZHeFZYvSOSLf$  BID  CHA2DS2VASc  -If uncontrolled with above plan for AAD per cardio recommendations -Replace electrolytes,  Hyperglycemia likely steroid-induced -Check hemoglobin A1c -CBGs -Sliding scale insulin -Follow hyper/hypoglycemia protocol ` Best practice:  Diet:  Oral Pain/Anxiety/Delirium protocol (if indicated): No VAP protocol (if indicated): Not indicated DVT prophylaxis: Systemic AC GI prophylaxis: N/A Glucose control:  SSI Yes Central venous access:  N/A Arterial line:  N/A Foley:  N/A Mobility:  bed rest  PT consulted: Yes Last date of multidisciplinary  goals of care discussion [12/4] Code Status:  DNR Disposition:  Progressive Care Unit   = Goals of Care = Code Status Order: $RemoveBefor'@CODE'MZJRgufWhqiB$ @   Primary Emergency Contact: Circle, Home Phone: [phone] Patient wishes to pursue ongoing treatment, but concurred that if deteriorated to pulselessness, patient would prefer a natural death as opposed to invasive measures such as CPR and intubation.      Ottie Glazier, M.D.  Pulmonary & [place]

## 2021-11-19 NOTE — Progress Notes (Signed)
OT Cancellation Note  Patient Details Name: Margaret Stevenson MRN: [id number] DOB: 05-11-1946   Cancelled Treatment:    Reason Eval/Treat Not Completed: Medical issues which prohibited therapy. Upon arrival to room, pt noted to have elevated resting HR this AM (120-130 bpm). OT intervention not appropriate at this time (exertional activity contraindicated 2/2 elevated resting HR). Per RN, pt just received amiodarone and cardizem. OT to re-attempt at later time/date as able.  Fredirick Maudlin, OTR/L [place]

## 2021-11-19 NOTE — Progress Notes (Addendum)
Progress Note    Margaret Stevenson  RJJ:[id number] DOB: 05/04/1946  DOA: 11/05/2021 PCP: Dion Body, MD      Brief Narrative:    Medical records reviewed and are as summarized below:  Margaret Stevenson is a 75 y.o. female with medical history significant for COPD, chronic hypoxemic respiratory failure, pulmonary nodules, history of melanoma, who presented to the hospital because of difficulty breathing.      Assessment/Plan:   Principal Problem:   COPD with acute exacerbation (HCC) Active Problems:   Tobacco dependence   Acute on chronic respiratory failure with hypoxia (HCC)   Paroxysmal atrial fibrillation (HCC)   Malnutrition of moderate degree   Hypokalemia   Nutrition Problem: Moderate Malnutrition Etiology: chronic illness (COPD)  Signs/Symptoms: mild fat depletion, moderate fat depletion, mild muscle depletion, moderate muscle depletion    Acute on chronic hypoxemic respiratory failure: She is not improving.  She is on oxygen via HFNC at 75% at 45 L/min.  Difficult to wean oxygen.  COPD exacerbation: Chest x-ray on 11/16/2021 showed increased interstitial markings.  Continue steroids and bronchodilators.  No additional antibiotics needed per pulmonologist..  Continue chest physiotherapy.  Completed 5 days of azithromycin.  Leukocytosis: This is likely from steroids.  Paroxysmal atrial fibrillation with RVR: She still in A. fib with fluctuating heart rate.  Continue diltiazem, amiodarone and Eliquis.    Hypokalemia: Improved  Constipation: Continue laxatives.  Abdominal x-ray on 11/16/2021 showed large amount of stool in the right colon.  Pulmonary nodules: PET scan in April 2022 suggestive of benign etiology.  Outpatient follow-up with CT scan for continued surveillance recommended.  Tobacco use disorder: Counseled to quit smoking.  Follow-up with palliative care  Diet Order             Diet regular Room service appropriate? Yes; Fluid  consistency: Thin  Diet effective now                      Consultants: Pulmonologist Intensivist Palliative care  Procedures: None    Medications:    amiodarone  400 mg Oral BID   apixaban  5 mg Oral BID   diltiazem  180 mg Oral Daily   fluticasone  2 spray Each Nare Daily   guaiFENesin  1,200 mg Oral BID   ipratropium-albuterol  3 mL Nebulization Q6H   lactulose  20 g Oral TID   mouth rinse  15 mL Mouth Rinse BID   milk and molasses  1 enema Rectal Once   multivitamin with minerals  1 tablet Oral Daily   pravastatin  40 mg Oral q1800   senna-docusate  2 tablet Oral BID   Continuous Infusions:     Anti-infectives (From admission, onward)    Start     Dose/Rate Route Frequency Ordered Stop   11/17/21 1100  cefTRIAXone (ROCEPHIN) 1 g in sodium chloride 0.9 % 100 mL IVPB  Status:  Discontinued        1 g 200 mL/hr over 30 Minutes Intravenous Every 24 hours 11/17/21 0928 11/17/21 1818   11/07/21 2000  azithromycin (ZITHROMAX) 500 mg in sodium chloride 0.9 % 250 mL IVPB        500 mg 250 mL/hr over 60 Minutes Intravenous Every 24 hours 11/07/21 1926 11/11/21 2105              Family Communication/Anticipated D/C date and plan/Code Status   DVT prophylaxis:  apixaban (ELIQUIS) tablet 5 mg  Code Status: DNR  Family Communication: None Disposition Plan: Plan to discharge to SNF   Status is: Inpatient  Remains inpatient appropriate because: Hypoxemic on oxygen via heated humidified HFNC           Subjective:   C/o cough and shortness of breath.  She has not had any significant bowel movement.  She said she had  "a smear" yesterday morning.  No abdominal pain, vomiting.   Objective:    Vitals:   11/19/21 0025 11/19/21 0400 11/19/21 0756 11/19/21 1121  BP: 125/63 112/62 125/64   Pulse: 81 79 81 80  Resp: (!) 21 (!) 23 20 (!) 26  Temp: 97.9 F (36.6 C)  98.3 F (36.8 C)   TempSrc: Axillary     SpO2: 94% 90% 91% 91%   Weight:      Height:       No data found.   Intake/Output Summary (Last 24 hours) at 11/19/2021 1156 Last data filed at 11/19/2021 0900 Gross per 24 hour  Intake 240 ml  Output --  Net 240 ml   Filed Weights   11/17/21 1200  Weight: 54 kg    Exam:  GEN: NAD SKIN: No rash EYES: EOMI ENT: MMM CV: Irregular rate and rhythm, tachycardic PULM: Decreased air entry bilaterally.  No wheezing or rales ABD: soft, ND, NT, +BS CNS: AAO x 3, non focal EXT: No edema or tenderness             Data Reviewed:   I have personally reviewed following labs and imaging studies:  Labs: Labs show the following:   Basic Metabolic Panel: Recent Labs  Lab 11/13/21 0415 11/14/21 0350 11/19/21 0805  NA 140 137 138  K 4.4 4.0 4.2  CL 92* 94* 93*  CO2 41* 39* 40*  GLUCOSE 149* 133* 102*  BUN 26* 23 19  CREATININE 0.34* 0.34* <0.30*  CALCIUM 9.0 8.3* 8.1*  MG  --   --  2.3   GFR CrCl cannot be calculated (This lab value cannot be used to calculate CrCl because it is not a number: <0.30). Liver Function Tests: No results for input(s): AST, ALT, ALKPHOS, BILITOT, PROT, ALBUMIN in the last 168 hours. No results for input(s): LIPASE, AMYLASE in the last 168 hours. No results for input(s): AMMONIA in the last 168 hours. Coagulation profile No results for input(s): INR, PROTIME in the last 168 hours.  CBC: Recent Labs  Lab 11/13/21 0415 11/14/21 0350 11/17/21 1247 11/19/21 0805  WBC 17.1* 19.4* 23.7* 17.1*  NEUTROABS  --   --   --  13.6*  HGB 15.2* 14.0 13.6 13.9  HCT 50.0* 44.7 42.9 45.1  MCV 101.0* 100.9* 98.2 100.9*  PLT 226 207 254 262   Cardiac Enzymes: No results for input(s): CKTOTAL, CKMB, CKMBINDEX, TROPONINI in the last 168 hours. BNP (last 3 results) No results for input(s): PROBNP in the last 8760 hours. CBG: No results for input(s): GLUCAP in the last 168 hours. D-Dimer: No results for input(s): DDIMER in the last 72 hours. Hgb A1c: No results  for input(s): HGBA1C in the last 72 hours. Lipid Profile: No results for input(s): CHOL, HDL, LDLCALC, TRIG, CHOLHDL, LDLDIRECT in the last 72 hours. Thyroid function studies: No results for input(s): TSH, T4TOTAL, T3FREE, THYROIDAB in the last 72 hours.  Invalid input(s): FREET3  Anemia work up: No results for input(s): VITAMINB12, FOLATE, FERRITIN, TIBC, IRON, RETICCTPCT in the last 72 hours. Sepsis Labs: Recent Labs  Lab 11/13/21 0415 11/14/21  0350 11/17/21 1247 11/19/21 0805  WBC 17.1* 19.4* 23.7* 17.1*    Microbiology No results found for this or any previous visit (from the past 240 hour(s)).   Procedures and diagnostic studies:  No results found.             LOS: 14 days   [place] Copywriter, advertising on [email or web address]. If 7PM-7AM, please contact night-coverage at [email or web address]     11/19/2021, 11:56 AM

## 2021-11-19 NOTE — Telephone Encounter (Signed)
-----   Message from [place], PA-C sent at 11/17/2021 11:34 AM EST ----- Regarding: hospital follow-up PT needs hospital follow-up in 3-4 weeks. Please call and schedule. Thanks. [place] patient

## 2021-11-19 NOTE — TOC Progression Note (Signed)
Transition of Care [facility]) - Progression Note    Patient Details  Name: Margaret Stevenson MRN: [id number] Date of Birth: [birth year]  Transition of Care [facility] CM/SW [place], [place] Phone Number: 11/19/2021, 11:26 AM  Clinical Narrative:     TOC following for medical stability for plan of SNF at discharge.   Patient has bed offers from [facility], insurance auth will need to be started once patient nears medical readiness and bed choice made.    CSW notes patient currently with respiratory distress in the setting of atrial fibrillation with RVR, on HFNC  45L/m.    Expected Discharge Plan: [place] Barriers to Discharge: Continued Medical Work up  Expected Discharge Plan and Services Expected Discharge Plan: [place] arrangements for the past 2 months: Single Family Home                                       Social Determinants of Health (SDOH) Interventions    Readmission Risk Interventions No flowsheet data found.

## 2021-11-20 MED ORDER — FUROSEMIDE 10 MG/ML IJ SOLN
20.0000 mg | Freq: Every day | INTRAMUSCULAR | Status: DC
  Administered 2021-11-20 – 2021-11-23 (×4): 20 mg via INTRAVENOUS
  Filled 2021-11-20 (×4): qty 2

## 2021-11-20 NOTE — Plan of Care (Signed)
  Problem: Health Behavior/Discharge Planning: Goal: Ability to manage health-related needs will improve Outcome: Progressing   Problem: Clinical Measurements: Goal: Ability to maintain clinical measurements within normal limits will improve Outcome: Progressing   

## 2021-11-20 NOTE — Consult Note (Signed)
NAME:  Margaret Stevenson, MRN:  [id number], DOB:  1946/11/21, LOS: 37 ADMISSION DATE:  11/05/2021, CONSULTATION DATE: 11/11/2021 REFERRING MD: Irine Seal, MD, CHIEF COMPLAINT: Persistent hypoxia  Brief Patient Report  75 y.o. female w/ a h/o COPD and tob abuse, who was admitted 11/28 w/ resp failure and COPD w/ finding of PAF. HPI  75 year old female with past medical history of COPD, previous melanoma and pulmonary nodules who was admitted on 11/05/2021 with difficulty breathing.  CT scan of the chest on 11/03/2021 after an ER visit that was negative for pulmonary embolism and showed stable pulmonary nodules. She presented back to the ED with worsening respiratory symptoms.  Hospital Course: During the course of admission patient developed episode of SVT with rates as high as 180s.  Valsalva maneuver and carotid massage performed with improvement.  Adenosine was considered but rhythm improved prior to being administered.  Patient later given IV Cardizem push with improvement.  Cardiology consulted patient started on Eliquis and p.o. Cardizem 30 mg every 6 hours with plans to switch to CD before DC.  Patient continued to require increased oxygenation from 89 L HFNC O2.  Weaning attempts was made to 6 L but patient desatted to low 80s at rest.  This morning patient continues to complain of shortness of breath states she is satting in the 90s but feels like she is satting in the 80s.  Due to increased oxygen requirement with persistent hypoxia PCCM consulted to assist with management.  11/20/21- patient continues to struggle with improvement.   She feels that she may not be able to improve and she feels like "there are days I just want to give up".  She is aggitated today.    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   Elevated CA 19-9 level 05/08/2017   Arthritis 05/07/2017   Asthma without status asthmaticus 05/07/2017   Borderline diabetes 05/07/2017   Hx of adenomatous colonic polyps 05/07/2017   Tobacco dependence 05/07/2017   Sepsis ([place]) 04/27/2017   Disease of biliary tract     Obstruction of bile duct     Jaundice     Hypoxia 10/11/2016   Anxiety 10/11/2016   COPD (chronic obstructive pulmonary disease) ([place]) 10/06/2016   CAP (community acquired pneumonia) 10/06/2016   Hyperglycemia 10/06/2016   Vaccine counseling 06/21/2016   History of malignant melanoma of skin 08/03/2013    Significant Hospital Events   11/28: Admitted to progressive care unit with acute on chronic respiratory failure secondary to COPD exacerbation/anxiety 11/30: Continue to wean oxygen to 5 L/min. Cardiology consulted for Afib with RVR 12/1: Patient with increased oxygen requirement 8 to 9 L HFNC failed weaning. 12/2: Remains on high flow nasal cannula at 8 to 9 L to keep sats greater than 90% 12/3: Still requiring high oxygen also in A. fib with RVR 12/4:  PCCM consulted Consults:  Cardiology PCCM  Procedures:  None  Significant Diagnostic Tests:  11/28: Chest Xray> no acute cardiopulmonary process, emphysema 12/4: 7 mm right upper lobe pulmonary nodule, better  demonstrated on recent chest CT. 2. Peribronchial opacities with lower lobe predominance may represent bronchitic changes or interstitial pulmonary edema. 3. Emphysema  Micro Data:  11/28: SARS-CoV-2 PCR> negative 11/28: Influenza PCR> negative  Antimicrobials:  None  OBJECTIVE  Blood pressure 128/64, pulse 86, temperature 98.1 F (36.7 C), temperature source Axillary, resp. rate 19, height 5' 6.5" (1.689 m), weight 54 kg, SpO2 93  %.    FiO2 (%):  [82 %-85 %] 85 %   Intake/Output Summary (Last 24 hours) at 11/20/2021 1255 Last data filed at 11/20/2021 0700 Gross per 24 hour  Intake 240 ml  Output 250 ml  Net -10 ml    Filed Weights   11/17/21 1200  Weight: 54 kg    Physical Examination  GENERAL: 75 year-old patient lying in the bed with no acute distress.  EYES: Pupils equal, round, reactive to light and accommodation. No scleral icterus. Extraocular muscles intact.  HEENT: Head atraumatic, normocephalic. Oropharynx and nasopharynx clear.  NECK:  Supple, no jugular venous distention. No thyroid enlargement, no tenderness.  LUNGS: Decreased breath sounds bilaterally, no wheezing, rales,rhonchi or crepitation. No use of accessory muscles of respiration.  CARDIOVASCULAR: S1, S2 normal. No murmurs, rubs, or gallops.  ABDOMEN: Soft, nontender, nondistended. Bowel sounds present. No organomegaly or mass.  EXTREMITIES: No pedal edema, cyanosis, or clubbing.  NEUROLOGIC: Cranial nerves II through XII are intact.  Muscle strength 5/5 in all extremities. Sensation intact. Gait not checked.  PSYCHIATRIC: The patient is alert and oriented x 3.  SKIN: No obvious rash, lesion, or ulcer.   Labs/imaging that I havepersonally reviewed  (right click and "Reselect all SmartList Selections" daily)     Labs   CBC: Recent Labs  Lab 11/14/21 0350 11/17/21 1247 11/19/21 0805  WBC 19.4* 23.7* 17.1*  NEUTROABS  --   --  13.6*  HGB 14.0 13.6 13.9  HCT 44.7 42.9 45.1  MCV 100.9* 98.2 100.9*  PLT 207 254 262     Basic Metabolic Panel: Recent Labs  Lab 11/14/21 0350 11/19/21 0805  NA 137 138  K 4.0 4.2  CL 94* 93*  CO2 39* 40*  GLUCOSE 133* 102*  BUN 23 19  CREATININE 0.34* <0.30*  CALCIUM 8.3* 8.1*  MG  --  2.3    GFR: CrCl cannot be calculated (This lab value cannot be used to calculate CrCl because it is not a number: <0.30). Recent Labs  Lab 11/14/21 0350 11/17/21 1247 11/19/21 0805  WBC 19.4*  23.7* 17.1*     Liver Function Tests: No results for input(s): AST, ALT, ALKPHOS, BILITOT, PROT, ALBUMIN in the last 168 hours. No results for input(s): LIPASE, AMYLASE in the last 168 hours. No results for input(s): AMMONIA in the last 168 hours.  ABG    Component Value Date/Time   PHART 7.44 11/12/2021 0847   PCO2ART 68 (HH) 11/12/2021 0847   PO2ART 53 (L) 11/12/2021 0847   HCO3 46.2 (H) 11/12/2021 0847   O2SAT 88.3 11/12/2021 0847     Coagulation Profile: No results for input(s): INR, PROTIME in the last 168 hours.  Cardiac Enzymes: No results for input(s): CKTOTAL, CKMB, CKMBINDEX, TROPONINI in the last 168 hours.  HbA1C: Hgb A1c MFr Bld  Date/Time Value Ref Range Status  04/27/2017 12:17 AM 4.5 (L) 4.8 - 5.6 % Final    Comment:    (NOTE)         Pre-diabetes: 5.7 - 6.4         Diabetes: >6.4  Glycemic control for adults with diabetes: <7.0     CBG: No results for input(s): GLUCAP in the last 168 hours.  Review of Systems:   Review of Systems  Constitutional: Negative.   HENT: Negative.    Eyes: Negative.   Respiratory:  Positive for cough, sputum production, shortness of breath and wheezing. Negative for hemoptysis.   Cardiovascular:  Positive for palpitations, orthopnea and leg swelling. Negative for chest pain, claudication and PND.  Gastrointestinal: Negative.   Genitourinary: Negative.   Musculoskeletal:  Positive for back pain and neck pain.  Skin: Negative.   Neurological: Negative.   Endo/Heme/Allergies: Negative.   Psychiatric/Behavioral:  The patient is nervous/anxious.     Past Medical History  She,  has a past medical history of Arthritis, Cancer ([place]), COPD (chronic obstructive pulmonary disease) ([place]), Dyspnea, and Jaundice (04/2017).   Surgical History    Past Surgical History:  Procedure Laterality Date   BREAST BIOPSY Right 08/26/2018   Affirm Bx ("x" clip) path pending   BUNIONECTOMY     COLONOSCOPY     COLONOSCOPY WITH  PROPOFOL N/A 05/26/2017   Procedure: COLONOSCOPY WITH PROPOFOL;  Surgeon: Lucilla Lame, MD;  Location: [facility];  Service: Endoscopy;  Laterality: N/A;  please do not move appt time   ERCP N/A 04/25/2017   Procedure: ENDOSCOPIC RETROGRADE CHOLANGIOPANCREATOGRAPHY (ERCP);  Surgeon: Lucilla Lame, MD;  Location[facility] END[facility]COPY;  Service: Endoscopy;  Laterality: N/A;   ERCP N/A 09/30/2017   Procedure: ENDOSCOPIC RETROGRADE CHOLANGIOPANCREATOGRAPHY (ERCP) Pancreatic stent removal;  Surgeon: Lucilla Lame, MD;  Location: [facility] ENDOSCOPY;  Service: Endoscopy;  Laterality: N/A;   POLYPECTOMY  05/26/2017   Procedure: POLYPECTOMY;  Surgeon: Lucilla Lame, MD;  Location: [facility];  Service: Endoscopy;;   TONSILLECTOMY       Social History   reports that she quit smoking about 2 weeks ago. Her smoking use included cigarettes. She has a 28.50 pack-year smoking history. She has never used smokeless tobacco. She reports current alcohol use of about 7.0 standard drinks per week. She reports that she does not use drugs.   Family History   Her family history includes Breast cancer (age of onset: 61) in her mother.   Allergies Allergies  Allergen Reactions   Penicillins Hives    Has patient had a PCN reaction causing immediate rash, facial/tongue/throat swelling, SOB or lightheadedness with hypotension: no Has patient had a PCN reaction causing severe rash involving mucus membranes or skin necrosis: no Has patient had a PCN reaction that required hospitalization no Has patient had a PCN reaction occurring within the last 10 years: no If all of the above answers are "NO", then may proceed with Cephalosporin use.      Home Medications  Prior to Admission medications   Medication Sig Start Date End Date Taking? Authorizing Provider  albuterol (PROVENTIL) (2.5 MG/3ML) 0.083% nebulizer solution Take 3 mLs (2.5 mg total) by nebulization every 4 (four) hours as needed for wheezing or shortness  of breath. 11/03/21 11/03/22  Naaman Plummer, MD  albuterol (VENTOLIN HFA) 108 (90 Base) MCG/ACT inhaler Inhale 2 puffs into the lungs every 4 (four) hours as needed for shortness of breath.  08/27/16 01/24/19  [provider]  aspirin EC 81 MG tablet Take 81 mg by mouth daily. 10/11/21   [provider]  Fluticasone-Salmeterol (ADVAIR) 250-50 MCG/DOSE AEPB Inhale 1 puff into the lungs 2 (two) times daily.    [provider]  lovastatin (MEVACOR) 40 MG tablet Take 40  mg by mouth daily with supper. 10/11/21   [provider]  OXYGEN Inhale 3 L into the lungs. 1 L when not active    [provider]  tiotropium (SPIRIVA) 18 MCG inhalation capsule Place 18 mcg into inhaler and inhale at bedtime.  08/27/16 01/24/19  [provider]  TURMERIC PO Take by mouth.    [provider]    Scheduled Meds:  amiodarone  400 mg Oral BID   apixaban  5 mg Oral BID   diltiazem  180 mg Oral Daily   fluticasone  2 spray Each Nare Daily   guaiFENesin  1,200 mg Oral BID   ipratropium-albuterol  3 mL Nebulization Q6H   mouth rinse  15 mL Mouth Rinse BID   multivitamin with minerals  1 tablet Oral Daily   pravastatin  40 mg Oral q1800   predniSONE  20 mg Oral Q breakfast   senna-docusate  2 tablet Oral BID   Continuous Infusions:    PRN Meds:.albuterol, ALPRAZolam, bisacodyl, morphine injection, naproxen, polyethylene glycol, senna-docusate, sodium chloride, sodium chloride flush    Assessment & Plan:  Acute on chronic hypercapnic hypoxic respiratory failure secondary to AECOPD  PMHx: Asthma, COPD on Chronic home oxygen at 3L, current everyday smoker  -Supplemental oxygen as needed, maintain SpO2 > 88% -Intermittent chest x-ray & ABG PRN -Ensure adequate pulmonary hygiene -reduced steroids to prednisone - no need for antibiotics   Severe constipation     - s/p enema - no stool     - KUB today   Paroxysmal atrial fibrillation Runs of  SVT New since admission likely in the setting of AECOPD as above -Continue diltiazem 240 daily for rate control and as BP permits -Continue Eliquis 5mg  BID  CHA2DS2VASc  -If uncontrolled with above plan for AAD per cardio recommendations -Replace electrolytes,  Hyperglycemia likely steroid-induced -Check hemoglobin A1c -CBGs -Sliding scale insulin -Follow hyper/hypoglycemia protocol ` Best practice:  Diet:  Oral Pain/Anxiety/Delirium protocol (if indicated): No VAP protocol (if indicated): Not indicated DVT prophylaxis: Systemic AC GI prophylaxis: N/A Glucose control:  SSI Yes Central venous access:  N/A Arterial line:  N/A Foley:  N/A Mobility:  bed rest  PT consulted: Yes Last date of multidisciplinary goals of care discussion [12/4] Code Status:  DNR Disposition: Progressive Care Unit   = Goals of Care = Code Status Order: @CODE @   Primary Emergency ContactKarisma, [place], Home Phone: [phone] Patient wishes to pursue ongoing treatment, but concurred that if deteriorated to pulselessness, patient would prefer a natural death as opposed to invasive measures such as CPR and intubation.      Ottie Glazier, M.D.  Pulmonary [facility] [place]

## 2021-11-20 NOTE — Evaluation (Signed)
Occupational Therapy Re-Evaluation Patient Details Name: Margaret Stevenson MRN: [id number] DOB: [birth year] Today's Date: 11/20/2021   History of Present Illness Margaret Stevenson is a 75 y.o. female with medical history significant for Nicotine dependence and COPD on home oxygen at 3 L who presents to the ED for the second time in 2 days with COPD exacerbation unrelieved with home bronchodilators.  EMS recorded O2 sats in the 80s on her home flow rate and she was placed on CPAP. PMH of COPD, previous melanoma and pulmonary nodules.   Clinical Impression   Margaret Stevenson was seen for OT re-evaluation on this date. Upon arrival to room Margaret Stevenson reclined in bed, agreeable to return to bed. Margaret Stevenson requires MIN A + HHA for bed>chair SPT. SpO2 86% on 45L HFNC during mobility, maintined 90s at rest. Margaret Stevenson completes bridging at bedlevel w/o assist for simulated LBD, anticipate MIN A for threading over BLE. Margaret Stevenson goals met, upgraded this date. Margaret Stevenson continues to benefit from skilled OT services to maximize return to PLOF and minimize risk of future falls, injury, caregiver burden, and readmission. Will continue to follow POC. Discharge recommendation remains appropriate.        Recommendations for follow up therapy are one component of a multi-disciplinary discharge planning process, led by the attending physician.  Recommendations may be updated based on patient status, additional functional criteria and insurance authorization.   Follow Up Recommendations  Skilled nursing-short term rehab (<3 hours/day)    Assistance Recommended at Discharge Frequent or constant Supervision/Assistance  Functional Status Assessment     Equipment Recommendations  Other (comment) (defer to next venue of care)    Recommendations for Other Services       Precautions / Restrictions Precautions Precautions: Fall Restrictions Weight Bearing Restrictions: No      Mobility Bed Mobility Overal bed mobility: Needs Assistance Bed Mobility: Sit to  Supine       Sit to supine: Min guard;HOB elevated        Transfers Overall transfer level: Needs assistance Equipment used: Rolling walker (2 wheels) Transfers: Bed to chair/wheelchair/BSC   Stand pivot transfers: Min assist                Balance Overall balance assessment: Needs assistance Sitting-balance support: No upper extremity supported;Feet supported Sitting balance-Leahy Scale: Fair     Standing balance support: Single extremity supported Standing balance-Leahy Scale: Fair                             ADL either performed or assessed with clinical judgement   ADL Overall ADL's : Needs assistance/impaired                                       General ADL Comments: MIN A + HHA for ADL t/f. Margaret Stevenson completes bridging at bedlevel w/o assist for simulated LBD, anticipate MIN A for threading over BLE      Pertinent Vitals/Pain Pain Assessment: No/denies pain     Hand Dominance     Extremity/Trunk Assessment Upper Extremity Assessment Upper Extremity Assessment: Generalized weakness   Lower Extremity Assessment Lower Extremity Assessment: Generalized weakness       Communication     Cognition Arousal/Alertness: Awake/alert Behavior During Therapy: Anxious Overall Cognitive Status: Within Functional Limits for tasks assessed  General Comments: Margaret Stevenson is alert and oriented however frustrated with situation. easily agitated with minimal conversation. was willing to participate with encouragement and speaking direct/short to her.     General Comments  SpO2 86% on 45L HFNC during mobility, maintined 90s at rest    Exercises Exercises: Other exercises Other Exercises Other Exercises: Margaret Stevenson educated re: OT role, DME recs, d/c recs, falls prevention, ECS Other Exercises: SPT, sitting/standing balance/tolerance, bridging bedlevel                  OT Goals(Current goals can be found  in the care plan section) Acute Rehab OT Goals Patient Stated Goal: to get back to bed OT Goal Formulation: With patient Time For Goal Achievement: 12/04/21 Potential to Achieve Goals: Fair ADL Goals Margaret Stevenson Will Perform Grooming: sitting;with set-up;with supervision Margaret Stevenson Will Transfer to Toilet: with supervision;stand pivot transfer;bedside commode Margaret Stevenson Will Perform Toileting - Clothing Manipulation and hygiene: with supervision;sit to/from stand (c LRAD PRN)  OT Frequency: Min 2X/week    AM-PAC OT "6 Clicks" Daily Activity     Outcome Measure Help from another person eating meals?: None Help from another person taking care of personal grooming?: A Little Help from another person toileting, which includes using toliet, bedpan, or urinal?: A Lot Help from another person bathing (including washing, rinsing, drying)?: A Lot Help from another person to put on and taking off regular upper body clothing?: A Little Help from another person to put on and taking off regular lower body clothing?: A Lot 6 Click Score: 16   End of Session    Activity Tolerance: Patient tolerated treatment well Patient left: in bed;with call bell/phone within reach;with bed alarm set  OT Visit Diagnosis: Muscle weakness (generalized) (M62.81);Other abnormalities of gait and mobility (R26.89)                Time: 8366-2947 OT Time Calculation (min): 11 min Charges:  OT General Charges $OT Visit: 1 Visit OT Evaluation $OT Re-eval: 1 Re-eval  Dessie Coma, M.S. OTR/L  11/20/21, 2:59 PM  ascom [phone]

## 2021-11-20 NOTE — Progress Notes (Signed)
OT Cancellation Note  Patient Details Name: Miel Wisener MRN: [id number] DOB: Oct 07, 1946   Cancelled Treatment:    Reason Eval/Treat Not Completed: Patient at procedure or test/ unavailable. Upon arrival Mobility in tech at bedisde encouraging pt to participate in mobility. Will plan to return at later time as available to complete re-evaluation.   Dessie Coma, M.S. OTR/L  11/20/21, 11:32 AM  ascom [phone]

## 2021-11-20 NOTE — Progress Notes (Signed)
Progress Note    Margaret Stevenson  RDE:[id number] DOB: 1946/10/30  DOA: 11/05/2021 PCP: Dion Body, MD      Brief Narrative:    Medical records reviewed and are as summarized below:  Margaret Stevenson is a 75 y.o. female with medical history significant for COPD, chronic hypoxemic respiratory failure, pulmonary nodules, history of melanoma, who presented to the hospital because of difficulty breathing.      Assessment/Plan:   Principal Problem:   COPD with acute exacerbation (HCC) Active Problems:   Tobacco dependence   Acute on chronic respiratory failure with hypoxia (HCC)   Paroxysmal atrial fibrillation (HCC)   Malnutrition of moderate degree   Hypokalemia   Nutrition Problem: Moderate Malnutrition Etiology: chronic illness (COPD)  Signs/Symptoms: mild fat depletion, moderate fat depletion, mild muscle depletion, moderate muscle depletion    Acute on chronic hypoxemic respiratory failure: She is not getting any better.  Oxygen requirement has gone up to oxygen via HFNC, 85% 45 L/min.  She had been on 65% for some time.  Difficult to wean oxygen.  COPD exacerbation: Chest x-ray on 11/16/2021 showed increased interstitial markings. Continue steroids and bronchodilators. No additional antibiotics needed per pulmonologist..  Continue chest physiotherapy.  Completed 5 days of azithromycin.  Leukocytosis: This is likely from steroids.  Paroxysmal atrial fibrillation with RVR: She still in A. fib with fluctuating heart rate.  Continue diltiazem, amiodarone and Eliquis.    Hypokalemia: Improved  Constipation: Continue laxatives.  Abdominal x-ray on 11/16/2021 showed large amount of stool in the right colon.  Pulmonary nodules: PET scan in April 2022 suggestive of benign etiology.  Outpatient follow-up with CT scan for continued surveillance recommended.  Tobacco use disorder: Counseled to quit smoking.  Follow-up with palliative care  Diet Order              Diet regular Room service appropriate? Yes; Fluid consistency: Thin  Diet effective now                      Consultants: Pulmonologist Intensivist Palliative care  Procedures: None    Medications:    amiodarone  400 mg Oral BID   apixaban  5 mg Oral BID   diltiazem  180 mg Oral Daily   fluticasone  2 spray Each Nare Daily   furosemide  20 mg Intravenous Daily   guaiFENesin  1,200 mg Oral BID   ipratropium-albuterol  3 mL Nebulization Q6H   mouth rinse  15 mL Mouth Rinse BID   multivitamin with minerals  1 tablet Oral Daily   pravastatin  40 mg Oral q1800   predniSONE  20 mg Oral Q breakfast   senna-docusate  2 tablet Oral BID   Continuous Infusions:     Anti-infectives (From admission, onward)    Start     Dose/Rate Route Frequency Ordered Stop   11/17/21 1100  cefTRIAXone (ROCEPHIN) 1 g in sodium chloride 0.9 % 100 mL IVPB  Status:  Discontinued        1 g 200 mL/hr over 30 Minutes Intravenous Every 24 hours 11/17/21 0928 11/17/21 1818   11/07/21 2000  azithromycin (ZITHROMAX) 500 mg in sodium chloride 0.9 % 250 mL IVPB        500 mg 250 mL/hr over 60 Minutes Intravenous Every 24 hours 11/07/21 1926 11/11/21 2105              Family Communication/Anticipated D/C date and plan/Code Status   DVT prophylaxis:  apixaban (ELIQUIS) tablet 5 mg     Code Status: DNR  Family Communication: None Disposition Plan: Plan to discharge to SNF   Status is: Inpatient  Remains inpatient appropriate because: Hypoxemic on oxygen via heated humidified HFNC           Subjective:   Interval events noted.  She complains of cough and difficulty breathing even at rest.  She said she has not had any bowel movement.  However, fluid chart showed bowel movement on 11/19/2021 at 8:07 PM.  Objective:    Vitals:   11/20/21 0736 11/20/21 0818 11/20/21 1212 11/20/21 1317  BP:  (!) 125/59 128/64   Pulse:  83 86   Resp:  17 19   Temp:   98.1 F  (36.7 C)   TempSrc:   Axillary   SpO2: 96% 92% 93% 95%  Weight:      Height:       No data found.   Intake/Output Summary (Last 24 hours) at 11/20/2021 1400 Last data filed at 11/20/2021 0700 Gross per 24 hour  Intake 240 ml  Output 250 ml  Net -10 ml   Filed Weights   11/17/21 1200  Weight: 54 kg    Exam:  GEN: NAD SKIN: No rash EYES: EOMI ENT: MMM CV: RRR PULM: Decreased air entry bilaterally.  No wheezing or rales heard ABD: soft, ND, NT, +BS CNS: AAO x 3, non focal EXT: No edema or tenderness             Data Reviewed:   I have personally reviewed following labs and imaging studies:  Labs: Labs show the following:   Basic Metabolic Panel: Recent Labs  Lab 11/14/21 0350 11/19/21 0805  NA 137 138  K 4.0 4.2  CL 94* 93*  CO2 39* 40*  GLUCOSE 133* 102*  BUN 23 19  CREATININE 0.34* <0.30*  CALCIUM 8.3* 8.1*  MG  --  2.3   GFR CrCl cannot be calculated (This lab value cannot be used to calculate CrCl because it is not a number: <0.30). Liver Function Tests: No results for input(s): AST, ALT, ALKPHOS, BILITOT, PROT, ALBUMIN in the last 168 hours. No results for input(s): LIPASE, AMYLASE in the last 168 hours. No results for input(s): AMMONIA in the last 168 hours. Coagulation profile No results for input(s): INR, PROTIME in the last 168 hours.  CBC: Recent Labs  Lab 11/14/21 0350 11/17/21 1247 11/19/21 0805  WBC 19.4* 23.7* 17.1*  NEUTROABS  --   --  13.6*  HGB 14.0 13.6 13.9  HCT 44.7 42.9 45.1  MCV 100.9* 98.2 100.9*  PLT 207 254 262   Cardiac Enzymes: No results for input(s): CKTOTAL, CKMB, CKMBINDEX, TROPONINI in the last 168 hours. BNP (last 3 results) No results for input(s): PROBNP in the last 8760 hours. CBG: No results for input(s): GLUCAP in the last 168 hours. D-Dimer: No results for input(s): DDIMER in the last 72 hours. Hgb A1c: No results for input(s): HGBA1C in the last 72 hours. Lipid Profile: No results  for input(s): CHOL, HDL, LDLCALC, TRIG, CHOLHDL, LDLDIRECT in the last 72 hours. Thyroid function studies: No results for input(s): TSH, T4TOTAL, T3FREE, THYROIDAB in the last 72 hours.  Invalid input(s): FREET3  Anemia work up: No results for input(s): VITAMINB12, FOLATE, FERRITIN, TIBC, IRON, RETICCTPCT in the last 72 hours. Sepsis Labs: Recent Labs  Lab 11/14/21 0350 11/17/21 1247 11/19/21 0805  WBC 19.4* 23.7* 17.1*    Microbiology No results found for  this or any previous visit (from the past 240 hour(s)).   Procedures and diagnostic studies:  No results found.             LOS: 15 days   [place] Copywriter, advertising on [email or web address]. If 7PM-7AM, please contact night-coverage at [email or web address]     11/20/2021, 2:00 PM

## 2021-11-20 NOTE — Progress Notes (Signed)
Mobility Specialist - Progress Note   11/20/21 1100  Mobility  Activity Transferred:  Bed to chair  Level of Assistance Minimal assist, patient does 75% or more  Assistive Device Front wheel walker  Distance Ambulated (ft) 2 ft  Mobility Out of bed to chair with meals  Mobility Response Tolerated fair  Mobility performed by Mobility specialist  $Mobility charge 1 Mobility    Pre-mobility: 117 HR, 94% SpO2 During mobility: 120-135 HR, 92% SpO2 Post-mobility: 110 HR, 93% SpO2   Pt lying in bed upon arrival, utilizing 45L HHFNC. Encouragement to participate in OOB activity. MinA to transfer EOB and stand from elevated bed height. Pt a little self-limiting, stating "I can't" with transfers, but needing only light assist. O2 maintained > 92% throughout session. Max HR 135 bpm. Pt left in recliner with alarm set. RN notified.     Kathee Delton Mobility Specialist 11/20/21, 12:04 PM

## 2021-11-20 NOTE — Progress Notes (Signed)
PT Cancellation Note  Patient Details Name: Margaret Stevenson MRN: [id number] DOB: 1946-04-05   Cancelled Treatment:    Reason Eval/Treat Not Completed: Other (comment).  Pt reporting that she was worn out from today's activities (worked with mobility specialist this morning and OT this afternoon) and was not able to participate in therapy at this time (needed to rest).  Will re-attempt PT session at a later date/time.  Leitha Bleak, PT 11/20/21, 3:40 PM

## 2021-11-20 NOTE — Telephone Encounter (Signed)
LVM to schedule appt

## 2021-11-21 ENCOUNTER — Inpatient Hospital Stay: Payer: Medicare Other

## 2021-11-21 NOTE — Progress Notes (Signed)
[facility] NAME: Margaret Stevenson    MR#:  [id number]  DATE OF BIRTH:  23-Nov-1946  SUBJECTIVE:  patient was quite irritable this morning. She was irritated with therapist not coming in time to get her back to bed. She understands she has poor respiratory status and not much left to do. Trying to eat breakfast. She is constipated earlier continues to be on 45 L high flow nasal cannula oxygen was sats 96 -100%. She gets very anxious when her oxygen flow is adjusted.  REVIEW OF SYSTEMS:   Review of Systems  Constitutional:  Negative for chills, fever and weight loss.  HENT:  Negative for ear discharge, ear pain and nosebleeds.   Eyes:  Negative for blurred vision, pain and discharge.  Respiratory:  Positive for shortness of breath. Negative for sputum production, wheezing and stridor.   Cardiovascular:  Negative for chest pain, palpitations, orthopnea and PND.  Gastrointestinal:  Positive for constipation. Negative for abdominal pain, diarrhea, nausea and vomiting.  Genitourinary:  Negative for frequency and urgency.  Musculoskeletal:  Negative for back pain and joint pain.  Neurological:  Positive for weakness. Negative for sensory change, speech change and focal weakness.  Psychiatric/Behavioral:  Negative for depression and hallucinations. The patient is not nervous/anxious.   Tolerating Diet: Tolerating PT:   DRUG ALLERGIES:   Allergies  Allergen Reactions   Penicillins Hives    Has patient had a PCN reaction causing immediate rash, facial/tongue/throat swelling, SOB or lightheadedness with hypotension: no Has patient had a PCN reaction causing severe rash involving mucus membranes or skin necrosis: no Has patient had a PCN reaction that required hospitalization no Has patient had a PCN reaction occurring within the last 10 years: no If all of the above answers are "NO", then may proceed with Cephalosporin use.     VITALS:   Blood pressure 118/62, pulse 79, temperature 98.1 F (36.7 C), resp. rate 17, height 5' 6.5" (1.689 m), weight 54 kg, SpO2 97 %.  PHYSICAL EXAMINATION:   Physical Exam  GENERAL:  75 y.o.-year-old patient lying in the bed with acute distress. Thin cachectic HEENT: Head atraumatic, normocephalic. Oropharynx and nasopharynx clear.  LUNGS: distant breath sounds bilaterally, no wheezing, rales, rhonchi. No use of accessory muscles of respiration.  CARDIOVASCULAR: S1, S2 normal. No murmurs, rubs, or gallops.  ABDOMEN: Soft, nontender, nondistended. Bowel sounds present. No organomegaly or mass.  EXTREMITIES: No cyanosis, clubbing or edema b/l.    NEUROLOGIC: nonfocal PSYCHIATRIC:  patient is alert and oriented x 3.  SKIN: No obvious rash, lesion, or ulcer.   LABORATORY PANEL:  CBC Recent Labs  Lab 11/19/21 0805  WBC 17.1*  HGB 13.9  HCT 45.1  PLT 262    Chemistries  Recent Labs  Lab 11/19/21 0805  NA 138  K 4.2  CL 93*  CO2 40*  GLUCOSE 102*  BUN 19  CREATININE <0.30*  CALCIUM 8.1*  MG 2.3   Cardiac Enzymes No results for input(s): TROPONINI in the last 168 hours. RADIOLOGY:  DG Chest Port 1 View  Result Date: 11/21/2021 CLINICAL DATA:  Pneumonia EXAM: PORTABLE CHEST 1 VIEW COMPARISON:  Previous studies including the examination of 11/16/2021 FINDINGS: Transverse diameter of heart is slightly increased.Central pulmonary vessels are prominent without signs of alveolar pulmonary edema. There is possible small calcified granuloma in the left mid lung fields. There is interval worsening of infiltrate in the right lower lung fields. There is possible increase  in amount of right pleural effusion. There is improvement in aeration of medial left lower lung fields. No new focal infiltrates are seen in the left lung. Left lateral CP angle is clear. There is no pneumothorax. Low position of diaphragms suggests COPD. IMPRESSION: There is interval worsening of infiltrate in right lower  lung fields suggesting worsening atelectasis/pneumonia. There is interval improvement in aeration in the medial left lower lung fields suggesting resolving subsegmental atelectasis/pneumonia. Small right pleural effusion with interval increase. Electronically Signed   By: Elmer Picker M.D.   On: 11/21/2021 12:25   ASSESSMENT AND PLAN:   Margaret Stevenson is a 75 y.o. female with medical history significant for COPD, chronic hypoxemic respiratory failure, pulmonary nodules, history of melanoma, who presented to the hospital because of difficulty breathing    Acute on chronic hypoxemic respiratory failure: She is not getting any better.  Oxygen requirement has gone up to oxygen via HFNC, 85% 45 L/min.  She had been on 65% for some time.  Difficult to wean oxygen. 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 She has been mentioning to the staff and also to delete that she is going to die here. -- Palliative care has been following. Discussed case with Dr.aleskerov and he is in agreement with hospice care if patient agrees to it. Will discuss with son. -- Chest x-ray from today does not show much improved   Severe COPD exacerbation: Chest x-ray on 11/16/2021 showed increased interstitial markings. Continue steroids and bronchodilators. No additional antibiotics needed per pulmonologist..  Continue chest physiotherapy.  Completed 5 days of azithromycin.   Leukocytosis: This is likely from steroids.   Paroxysmal atrial fibrillation with RVR: She still in A. fib with fluctuating heart rate.  Continue diltiazem, amiodarone and Eliquis.     Hypokalemia: Improved   Constipation: Continue laxatives.  --- Patient had good BM today per  Pulmonary nodules: PET scan in April 2022 suggestive of benign etiology.  Outpatient follow-up with CT scan for continued surveillance recommended.   Tobacco use disorder: Counseled to quit smoking. Nutrition Status: Nutrition Problem: Moderate  Malnutrition Etiology: chronic illness (COPD) Signs/Symptoms: mild fat depletion, moderate fat depletion, mild muscle depletion, moderate muscle depletion Interventions: MVI, Magic cup    Procedures: Family communication : Consults : pulmonary, palliative CODE STATUS: DNR DVT Prophylaxis : Level of care: Progressive Status is: Inpatient  Remains inpatient appropriate because: poor respiratory status        TOTAL TIME TAKING CARE OF THIS PATIENT: 25 minutes.  >50% time spent on counselling and coordination of care  Note: This dictation was prepared with Dragon dictation along with smaller phrase technology. Any transcriptional errors that result from this process are unintentional.  Fritzi Mandes M.D    [facility]   CC: Primary care physician; Dion Body, MD Patient ID: Margaret Stevenson, female   DOB: November 21, 1946, 75 y.o.   MRN: [id number]

## 2021-11-21 NOTE — Progress Notes (Signed)
Nutrition Follow-up  DOCUMENTATION CODES:   Non-severe (moderate) malnutrition in context of chronic illness  INTERVENTION:   -Continue Ensure Enlive po BID, each supplement provides 350 kcal and 20 grams of protein (pt desires to bring in home supply) -Continue MVI with minerals daily -Continue Magic cup TID with meals, each supplement provides 290 kcal and 9 grams of protein   NUTRITION DIAGNOSIS:   Moderate Malnutrition related to chronic illness (COPD) as evidenced by mild fat depletion, moderate fat depletion, mild muscle depletion, moderate muscle depletion.  Ongoing  GOAL:   Patient will meet greater than or equal to 90% of their needs  Progressing   MONITOR:   PO intake, Supplement acceptance, Labs, Weight trends, Skin, I & O's  REASON FOR ASSESSMENT:   Consult Assessment of nutrition requirement/status  ASSESSMENT:   Margaret Stevenson is a 75 y.o. female with medical history significant for Nicotine dependence and COPD on home oxygen at 3 L who presents to the ED for the second time in 2 days with COPD exacerbation unrelieved with home bronchodilators.  EMS recorded O2 sats in the 80s on her home flow rate and she was placed on CPAP.  History is limited due to her clinical condition.  Son is at the bedside.  She has had no chest pain, fever or chills no any nausea, vomiting, abdominal pain or diarrhea  Reviewed I/O's: -900 ml x 24 hours and -630 ml since 11/07/21  UOP: 900 ml x 24 hours   Per pulmonology notes, pt remains on HFNC with very minimal improvement. Pt with very poor prognosis.   Pt remains on a regular diet. Noted meal completions 25-100%.   Pt refused to be given Ensure in the hospital and prefers son to bring in her home supply.   Palliative care continues to follow for goals of care discussions.   Medications reviewed and include cardizem, lasix, prendisone, and senkot  Labs reviewed.   Diet Order:   Diet Order             Diet regular  Room service appropriate? Yes; Fluid consistency: Thin  Diet effective now                   EDUCATION NEEDS:   Education needs have been addressed  Skin:  Skin Assessment: Reviewed RN Assessment  Last BM:  11/20/21  Height:   Ht Readings from Last 1 Encounters:  11/17/21 5' 6.5" (1.689 m)    Weight:   Wt Readings from Last 1 Encounters:  11/17/21 54 kg    Ideal Body Weight:  60.2 kg  BMI:  Body mass index is 18.93 kg/m.  Estimated Nutritional Needs:   Kcal:  2050-2250  Protein:  105-120 grams  Fluid:  > 2 L    Loistine Chance, RD, LDN, [place] Registered Dietitian II Certified Diabetes Care and Education Specialist Please refer to [facility] for RD and/or RD on-call/weekend/after hours pager

## 2021-11-21 NOTE — Consult Note (Signed)
NAME:  Margaret Stevenson, MRN:  [id number], DOB:  02-14-1946, LOS: 26 ADMISSION DATE:  11/05/2021, CONSULTATION DATE: 11/11/2021 REFERRING MD: Irine Seal, MD, CHIEF COMPLAINT: Persistent hypoxia  Brief Patient Report  75 y.o. female w/ a h/o COPD and tob abuse, who was admitted 11/28 w/ resp failure and COPD w/ finding of PAF. HPI  75 year old female with past medical history of COPD, previous melanoma and pulmonary nodules who was admitted on 11/05/2021 with difficulty breathing.  CT scan of the chest on 11/03/2021 after an ER visit that was negative for pulmonary embolism and showed stable pulmonary nodules. She presented back to the ED with worsening respiratory symptoms.  Hospital Course: During the course of admission patient developed episode of SVT with rates as high as 180s.  Valsalva maneuver and carotid massage performed with improvement.  Adenosine was considered but rhythm improved prior to being administered.  Patient later given IV Cardizem push with improvement.  Cardiology consulted patient started on Eliquis and p.o. Cardizem 30 mg every 6 hours with plans to switch to CD before DC.  Patient continued to require increased oxygenation from 89 L HFNC O2.  Weaning attempts was made to 6 L but patient desatted to low 80s at rest.  This morning patient continues to complain of shortness of breath states she is satting in the 90s but feels like she is satting in the 80s.  Due to increased oxygen requirement with persistent hypoxia PCCM consulted to assist with management.  11/21/21- patient continues to struggle with oxygenation despite maximal medical management and prolonged hospitalization.  I reviewed medical plan with attending physician today and at this time recommendation remains for palliative care with hospice.   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   Elevated CA 19-9 level 05/08/2017   Arthritis 05/07/2017   Asthma without status asthmaticus 05/07/2017   Borderline diabetes 05/07/2017   Hx of adenomatous colonic polyps 05/07/2017   Tobacco dependence 05/07/2017   Sepsis ([place]) 04/27/2017   Disease of biliary tract     Obstruction of bile duct     Jaundice     Hypoxia 10/11/2016   Anxiety 10/11/2016   COPD (chronic obstructive pulmonary disease) ([place]) 10/06/2016   CAP (community acquired pneumonia) 10/06/2016   Hyperglycemia 10/06/2016   Vaccine counseling 06/21/2016   History of malignant melanoma of skin 08/03/2013    Significant Hospital Events   11/28: Admitted to progressive care unit with acute on chronic respiratory failure secondary to COPD exacerbation/anxiety 11/30: Continue to wean oxygen to 5 L/min. Cardiology consulted for Afib with RVR 12/1: Patient with increased oxygen requirement 8 to 9 L HFNC failed weaning. 12/2: Remains on high flow nasal cannula at 8 to 9 L to keep sats greater than 90% 12/3: Still requiring high oxygen also in A. fib with RVR 12/4:  PCCM consulted Consults:  Cardiology PCCM  Procedures:  None  Significant Diagnostic Tests:  11/28: Chest Xray> no acute cardiopulmonary process, emphysema 12/4: 7 mm right upper lobe pulmonary nodule, better demonstrated on recent chest  CT. 2. Peribronchial opacities with lower lobe predominance may represent bronchitic changes or interstitial pulmonary edema. 3. Emphysema  Micro Data:  11/28: SARS-CoV-2 PCR> negative 11/28: Influenza PCR> negative  Antimicrobials:  None  OBJECTIVE  Blood pressure 130/70, pulse 83, temperature 98.4 F (36.9 C), temperature source Oral,  resp. rate 18, height 5' 6.5" (1.689 m), weight 54 kg, SpO2 98 %.    FiO2 (%):  [80 %-100 %] 100 %   Intake/Output Summary (Last 24 hours) at 11/21/2021 0846 Last data filed at 11/21/2021 0844 Gross per 24 hour  Intake 240 ml  Output 900 ml  Net -660 ml    Filed Weights   11/17/21 1200  Weight: 54 kg    Physical Examination  GENERAL: 74 year-old patient lying in the bed with no acute distress.  EYES: Pupils equal, round, reactive to light and accommodation. No scleral icterus. Extraocular muscles intact.  HEENT: Head atraumatic, normocephalic. Oropharynx and nasopharynx clear.  NECK:  Supple, no jugular venous distention. No thyroid enlargement, no tenderness.  LUNGS: Decreased breath sounds bilaterally, no wheezing, rales,rhonchi or crepitation. No use of accessory muscles of respiration.  CARDIOVASCULAR: S1, S2 normal. No murmurs, rubs, or gallops.  ABDOMEN: Soft, nontender, nondistended. Bowel sounds present. No organomegaly or mass.  EXTREMITIES: No pedal edema, cyanosis, or clubbing.  NEUROLOGIC: Cranial nerves II through XII are intact.  Muscle strength 5/5 in all extremities. Sensation intact. Gait not checked.  PSYCHIATRIC: The patient is alert and oriented x 3.  SKIN: No obvious rash, lesion, or ulcer.   Labs/imaging that I havepersonally reviewed  (right click and "Reselect all SmartList Selections" daily)     Labs   CBC: Recent Labs  Lab 11/17/21 1247 11/19/21 0805  WBC 23.7* 17.1*  NEUTROABS  --  13.6*  HGB 13.6 13.9  HCT 42.9 45.1  MCV 98.2 100.9*  PLT 254 262     Basic Metabolic Panel: Recent Labs  Lab 11/19/21 0805  NA 138  K 4.2  CL 93*  CO2 40*  GLUCOSE 102*  BUN 19  CREATININE <0.30*  CALCIUM 8.1*  MG 2.3    GFR: CrCl cannot be calculated (This lab value cannot be used to calculate CrCl because it is not a number: <0.30). Recent Labs  Lab 11/17/21 1247 11/19/21 0805  WBC 23.7* 17.1*     Liver Function Tests: No results for  input(s): AST, ALT, ALKPHOS, BILITOT, PROT, ALBUMIN in the last 168 hours. No results for input(s): LIPASE, AMYLASE in the last 168 hours. No results for input(s): AMMONIA in the last 168 hours.  ABG    Component Value Date/Time   PHART 7.44 11/12/2021 0847   PCO2ART 68 (HH) 11/12/2021 0847   PO2ART 53 (L) 11/12/2021 0847   HCO3 46.2 (H) 11/12/2021 0847   O2SAT 88.3 11/12/2021 0847     Coagulation Profile: No results for input(s): INR, PROTIME in the last 168 hours.  Cardiac Enzymes: No results for input(s): CKTOTAL, CKMB, CKMBINDEX, TROPONINI in the last 168 hours.  HbA1C: Hgb A1c MFr Bld  Date/Time Value Ref Range Status  04/27/2017 12:17 AM 4.5 (L) 4.8 - 5.6 % Final    Comment:    (NOTE)         Pre-diabetes: 5.7 - 6.4         Diabetes: >6.4         Glycemic control for adults with diabetes: <7.0     CBG: No results for input(s): GLUCAP in the last 168 hours.  Review of Systems:   Review of Systems  Constitutional: Negative.   HENT: Negative.    Eyes: Negative.   Respiratory:  Positive for cough, sputum production, shortness of breath and wheezing. Negative for hemoptysis.   Cardiovascular:  Positive for palpitations, orthopnea and leg swelling. Negative for chest pain, claudication and PND.  Gastrointestinal: Negative.   Genitourinary: Negative.   Musculoskeletal:  Positive for back pain and neck pain.  Skin: Negative.   Neurological: Negative.   Endo/Heme/Allergies: Negative.   Psychiatric/Behavioral:  The patient is nervous/anxious.     Past Medical History  She,  has a past medical history of Arthritis, Cancer ([place]), COPD (chronic obstructive pulmonary disease) ([place]), Dyspnea, and Jaundice (04/2017).   Surgical History    Past Surgical History:  Procedure Laterality Date   BREAST BIOPSY Right 08/26/2018   Affirm Bx ("x" clip) path pending   BUNIONECTOMY     COLONOSCOPY     COLONOSCOPY WITH PROPOFOL N/A 05/26/2017   Procedure: COLONOSCOPY WITH  PROPOFOL;  Surgeon: Lucilla Lame, MD;  Location: [place];  Service: Endoscopy;  Laterality: N/A;  please do not move appt time   ERCP N/A 04/25/2017   Procedure: ENDOSCOPIC RETROGRADE CHOLANGIOPANCREATOGRAPHY (ERCP);  Surgeon: Lucilla Lame, MD;  Location: [facility] ENDOSCOPY;  Service: Endoscopy;  Laterality: N/A;   ERCP N/A 09/30/2017   Procedure: ENDOSCOPIC RETROGRADE CHOLANGIOPANCREATOGRAPHY (ERCP) Pancreatic stent removal;  Surgeon: Lucilla Lame, MD;  Location: [facility] ENDOSCOPY;  Service: Endoscopy;  Laterality: N/A;   POLYPECTOMY  05/26/2017   Procedure: POLYPECTOMY;  Surgeon: Lucilla Lame, MD;  Location: [place];  Service: Endoscopy;;   TONSILLECTOMY       Social History   reports that she quit smoking about 2 weeks ago. Her smoking use included cigarettes. She has a 28.50 pack-year smoking history. She has never used smokeless tobacco. She reports current alcohol use of about 7.0 standard drinks per week. She reports that she does not use drugs.   Family History   Her family history includes Breast cancer (age of onset: 86) in her mother.   Allergies Allergies  Allergen Reactions   Penicillins Hives    Has patient had a PCN reaction causing immediate rash, facial/tongue/throat swelling, SOB or lightheadedness with hypotension: no Has patient had a PCN reaction causing severe rash involving mucus membranes or skin necrosis: no Has patient had a PCN reaction that required hospitalization no Has patient had a PCN reaction occurring within the last 10 years: no If all of the above answers are "NO", then may proceed with Cephalosporin use.      Home Medications  Prior to Admission medications   Medication Sig Start Date End Date Taking? Authorizing Provider  albuterol (PROVENTIL) (2.5 MG/3ML) 0.083% nebulizer solution Take 3 mLs (2.5 mg total) by nebulization every 4 (four) hours as needed for wheezing or shortness of breath. 11/03/21 11/03/22  Naaman Plummer, MD   albuterol (VENTOLIN HFA) 108 (90 Base) MCG/ACT inhaler Inhale 2 puffs into the lungs every 4 (four) hours as needed for shortness of breath.  08/27/16 01/24/19  [provider]  aspirin EC 81 MG tablet Take 81 mg by mouth daily. 10/11/21   [provider]  Fluticasone-Salmeterol (ADVAIR) 250-50 MCG/DOSE AEPB Inhale 1 puff into the lungs 2 (two) times daily.    [provider]  lovastatin (MEVACOR) 40 MG tablet Take 40 mg by mouth daily with supper. 10/11/21   [provider]  OXYGEN Inhale 3 L into the lungs. 1 L when  not active    [provider]  tiotropium (SPIRIVA) 18 MCG inhalation capsule Place 18 mcg into inhaler and inhale at bedtime.  08/27/16 01/24/19  [provider]  TURMERIC PO Take by mouth.    [provider]    Scheduled Meds:  amiodarone  400 mg Oral BID   apixaban  5 mg Oral BID   diltiazem  180 mg Oral Daily   fluticasone  2 spray Each Nare Daily   furosemide  20 mg Intravenous Daily   guaiFENesin  1,200 mg Oral BID   ipratropium-albuterol  3 mL Nebulization Q6H   mouth rinse  15 mL Mouth Rinse BID   multivitamin with minerals  1 tablet Oral Daily   pravastatin  40 mg Oral q1800   predniSONE  20 mg Oral Q breakfast   senna-docusate  2 tablet Oral BID   Continuous Infusions:    PRN Meds:.albuterol, ALPRAZolam, bisacodyl, morphine injection, naproxen, polyethylene glycol, senna-docusate, sodium chloride, sodium chloride flush    Assessment & Plan:  Acute on chronic hypercapnic hypoxic respiratory failure secondary to AECOPD  PMHx: Asthma, COPD on Chronic home oxygen at 3L, current everyday smoker  -Supplemental oxygen as needed, maintain SpO2 > 88% -Intermittent chest x-ray & ABG PRN -Ensure adequate pulmonary hygiene -reduced steroids to prednisone - no need for antibiotics   Severe constipation     - s/p enema - no stool     - KUB today   Paroxysmal atrial fibrillation Runs of SVT New since  admission likely in the setting of AECOPD as above -Continue diltiazem 240 daily for rate control and as BP permits -Continue Eliquis 5mg  BID  CHA2DS2VASc  -If uncontrolled with above plan for AAD per cardio recommendations -Replace electrolytes,  Hyperglycemia likely steroid-induced -Check hemoglobin A1c -CBGs -Sliding scale insulin -Follow hyper/hypoglycemia protocol ` Best practice:  Diet:  Oral Pain/Anxiety/Delirium protocol (if indicated): No VAP protocol (if indicated): Not indicated DVT prophylaxis: Systemic AC GI prophylaxis: N/A Glucose control:  SSI Yes Central venous access:  N/A Arterial line:  N/A Foley:  N/A Mobility:  bed rest  PT consulted: Yes Last date of multidisciplinary goals of care discussion [12/4] Code Status:  DNR Disposition: Progressive Care Unit   = Goals of Care = Code Status Order: @CODE @   Primary Emergency ContactQuorra, Rosene, Home Phone: [phone] Patient wishes to pursue ongoing treatment, but concurred that if deteriorated to pulselessness, patient would prefer a natural death as opposed to invasive measures such as CPR and intubation.      Ottie Glazier, M.D.  Pulmonary & [place]

## 2021-11-21 NOTE — Progress Notes (Signed)
Palliative: Margaret Stevenson is lying quietly in bed.  She greets me, making and somewhat keeping eye contact.  She appears acutely/chronically ill and quite frail.  She is alert and oriented, able to make her needs known.  There is no family at bedside at this time.    We talked about her respiratory status.  She tells me that she does not feel that she is much improved.  We also talked about her bowel regimen.  She shares that she has moved her bowels 3 times today.  If she continues to have multiple stools per day, senna S should be reduced to either 1 tab twice daily or 1 tab daily.  PMT to monitor.  I share that I saw her up to the [facility] chair yesterday.  She complains about this stating that the nurse that she would come back in 1 hour and did not.  Overall, she does seem somewhat improved.  Conference with attending, bedside nursing staff, transition of care team related to patient condition, needs, goals of care, disposition.  Plan: At this point continue to treat the treatable but no CPR or intubation.  Continue with time for outcomes.  Agreeable to short-term rehab if able.  Would benefit from outpatient palliative services, not discussed yet.  If no meaningful improvement may qualify for hospice home/residential hospice  25 minutes Margaret Axe, NP Palliative medicine team Team phone [phone] Greater than 50% of this time was spent counseling and coordinating care related to the above assessment and plan.

## 2021-11-21 NOTE — TOC Progression Note (Signed)
Transition of Care [facility]) - Progression Note    Patient Details  Name: Margaret Stevenson MRN: [id number] Date of Birth: [birth year]  Transition of [facility]) CM/SW [place], [place] Phone Number: 11/21/2021, 10:22 AM  Clinical Narrative:     CSW notes patient continues on HFNC 45, with little improvements.   TOC will continue to follow for appropriate discharge planning pending patient's medical course.   Please consult TOC for any current needs.   Expected Discharge Plan: [place] Barriers to Discharge: Continued Medical Work up  Expected Discharge Plan and Services Expected Discharge Plan: [place] arrangements for the past 2 months: Single Family Home                                       Social Determinants of Health (SDOH) Interventions    Readmission Risk Interventions No flowsheet data found.

## 2021-11-22 MED ORDER — MORPHINE SULFATE 10 MG/5ML PO SOLN
2.5000 mg | ORAL | Status: DC | PRN
  Administered 2021-11-22 – 2021-11-23 (×2): 2.5 mg via ORAL
  Filled 2021-11-22: qty 5

## 2021-11-22 MED ORDER — MORPHINE SULFATE 10 MG/5ML PO SOLN: 2.5000 mg | ORAL | Status: DC | PRN

## 2021-11-22 MED ORDER — MORPHINE SULFATE 10 MG/5ML PO SOLN
2.5000 mg | ORAL | Status: DC
  Administered 2021-11-22 – 2021-11-27 (×26): 2.5 mg via ORAL
  Filled 2021-11-22 (×27): qty 5

## 2021-11-22 MED ORDER — SENNOSIDES-DOCUSATE SODIUM 8.6-50 MG PO TABS
1.0000 | ORAL_TABLET | Freq: Two times a day (BID) | ORAL | Status: DC
  Administered 2021-11-26: 08:00:00 1 via ORAL
  Filled 2021-11-22 (×8): qty 1

## 2021-11-22 NOTE — Consult Note (Signed)
NAME:  Margaret Stevenson, MRN:  [id number], DOB:  1946-08-21, LOS: 109 ADMISSION DATE:  11/05/2021, CONSULTATION DATE: 11/11/2021 REFERRING MD: Irine Seal, MD, CHIEF COMPLAINT: Persistent hypoxia  Brief Patient Report  75 y.o. female w/ a h/o COPD and tob abuse, who was admitted 11/28 w/ resp failure and COPD w/ finding of PAF. HPI  75 year old female with past medical history of COPD, previous melanoma and pulmonary nodules who was admitted on 11/05/2021 with difficulty breathing.  CT scan of the chest on 11/03/2021 after an ER visit that was negative for pulmonary embolism and showed stable pulmonary nodules. She presented back to the ED with worsening respiratory symptoms.  Hospital Course: During the course of admission patient developed episode of SVT with rates as high as 180s.  Valsalva maneuver and carotid massage performed with improvement.  Adenosine was considered but rhythm improved prior to being administered.  Patient later given IV Cardizem push with improvement.  Cardiology consulted patient started on Eliquis and p.o. Cardizem 30 mg every 6 hours with plans to switch to CD before DC.  Patient continued to require increased oxygenation from 89 L HFNC O2.  Weaning attempts was made to 6 L but patient desatted to low 80s at rest.  This morning patient continues to complain of shortness of breath states she is satting in the 90s but feels like she is satting in the 80s.  Due to increased oxygen requirement with persistent hypoxia PCCM consulted to assist with management.  11/22/21- patient on maximal settings with HFNC. I spoke with her again today and stated there is nothing more I can add from medical perspective and we are at end of road in terms of aggressive therapy for COPD/emphysema.  She is agreeable to hospice.  PCCM will sign off.   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   Elevated CA 19-9 level 05/08/2017   Arthritis 05/07/2017   Asthma without status asthmaticus 05/07/2017   Borderline diabetes 05/07/2017   Hx of adenomatous colonic polyps 05/07/2017   Tobacco dependence 05/07/2017   Sepsis ([place]) 04/27/2017   Disease of biliary tract     Obstruction of bile duct     Jaundice     Hypoxia 10/11/2016   Anxiety 10/11/2016   COPD (chronic obstructive pulmonary disease) ([place]) 10/06/2016   CAP (community acquired pneumonia) 10/06/2016   Hyperglycemia 10/06/2016   Vaccine counseling 06/21/2016   History of malignant melanoma of skin 08/03/2013    Significant Hospital Events   11/28: Admitted to progressive care unit with acute on chronic respiratory failure secondary to COPD exacerbation/anxiety 11/30: Continue to wean oxygen to 5 L/min. Cardiology consulted for Afib with RVR 12/1: Patient with increased oxygen requirement 8 to 9 L HFNC failed weaning. 12/2: Remains on high flow nasal cannula at 8 to 9 L to keep sats greater than 90% 12/3: Still requiring high oxygen also in A. fib with RVR 12/4:  PCCM consulted Consults:  Cardiology PCCM  Procedures:  None  Significant Diagnostic Tests:  11/28: Chest Xray> no acute  cardiopulmonary process, emphysema 12/4: 7 mm right upper lobe pulmonary nodule, better demonstrated on recent chest CT. 2. Peribronchial opacities with lower lobe predominance may represent bronchitic changes or interstitial pulmonary edema. 3. Emphysema  Micro Data:  11/28: SARS-CoV-2 PCR> negative 11/28: Influenza PCR> negative  Antimicrobials:  None  OBJECTIVE  Blood pressure (!) 147/64, pulse 77, temperature 98.1 F (36.7 C), resp.  rate 20, height 5' 6.5" (1.689 m), weight 54 kg, SpO2 94 %.    FiO2 (%):  [95 %-96 %] 95 %   Intake/Output Summary (Last 24 hours) at 11/22/2021 1228 Last data filed at 11/22/2021 1000 Gross per 24 hour  Intake 1080 ml  Output 1950 ml  Net -870 ml    Filed Weights   11/17/21 1200  Weight: 54 kg    Physical Examination  GENERAL: 75 year-old patient lying in the bed with no acute distress.  EYES: Pupils equal, round, reactive to light and accommodation. No scleral icterus. Extraocular muscles intact.  HEENT: Head atraumatic, normocephalic. Oropharynx and nasopharynx clear.  NECK:  Supple, no jugular venous distention. No thyroid enlargement, no tenderness.  LUNGS: Decreased breath sounds bilaterally, no wheezing, rales,rhonchi or crepitation. No use of accessory muscles of respiration.  CARDIOVASCULAR: S1, S2 normal. No murmurs, rubs, or gallops.  ABDOMEN: Soft, nontender, nondistended. Bowel sounds present. No organomegaly or mass.  EXTREMITIES: No pedal edema, cyanosis, or clubbing.  NEUROLOGIC: Cranial nerves II through XII are intact.  Muscle strength 5/5 in all extremities. Sensation intact. Gait not checked.  PSYCHIATRIC: The patient is alert and oriented x 3.  SKIN: No obvious rash, lesion, or ulcer.   Labs/imaging that I havepersonally reviewed  (right click and "Reselect all SmartList Selections" daily)     Labs   CBC: Recent Labs  Lab 11/17/21 1247 11/19/21 0805  WBC 23.7* 17.1*  NEUTROABS  --  13.6*  HGB 13.6 13.9  HCT 42.9 45.1  MCV 98.2 100.9*  PLT 254 262     Basic Metabolic Panel: Recent Labs  Lab 11/19/21 0805  NA 138  K 4.2  CL 93*  CO2 40*  GLUCOSE 102*  BUN 19  CREATININE <0.30*  CALCIUM 8.1*  MG 2.3    GFR: CrCl cannot be calculated (This lab value cannot be used to calculate CrCl because it is not a number: <0.30). Recent Labs  Lab 11/17/21 1247 11/19/21 0805  WBC 23.7* 17.1*     Liver Function Tests: No results for  input(s): AST, ALT, ALKPHOS, BILITOT, PROT, ALBUMIN in the last 168 hours. No results for input(s): LIPASE, AMYLASE in the last 168 hours. No results for input(s): AMMONIA in the last 168 hours.  ABG    Component Value Date/Time   PHART 7.44 11/12/2021 0847   PCO2ART 68 (HH) 11/12/2021 0847   PO2ART 53 (L) 11/12/2021 0847   HCO3 46.2 (H) 11/12/2021 0847   O2SAT 88.3 11/12/2021 0847     Coagulation Profile: No results for input(s): INR, PROTIME in the last 168 hours.  Cardiac Enzymes: No results for input(s): CKTOTAL, CKMB, CKMBINDEX, TROPONINI in the last 168 hours.  HbA1C: Hgb A1c MFr Bld  Date/Time Value Ref Range Status  04/27/2017 12:17 AM 4.5 (L) 4.8 - 5.6 % Final    Comment:    (NOTE)         Pre-diabetes: 5.7 - 6.4         Diabetes: >6.4         Glycemic control for adults with diabetes: <7.0  CBG: No results for input(s): GLUCAP in the last 168 hours.  Review of Systems:   Review of Systems  Constitutional: Negative.   HENT: Negative.    Eyes: Negative.   Respiratory:  Positive for cough, sputum production, shortness of breath and wheezing. Negative for hemoptysis.   Cardiovascular:  Positive for palpitations, orthopnea and leg swelling. Negative for chest pain, claudication and PND.  Gastrointestinal: Negative.   Genitourinary: Negative.   Musculoskeletal:  Positive for back pain and neck pain.  Skin: Negative.   Neurological: Negative.   Endo/Heme/Allergies: Negative.   Psychiatric/Behavioral:  The patient is nervous/anxious.     Past Medical History  She,  has a past medical history of Arthritis, Cancer ([place]), COPD (chronic obstructive pulmonary disease) ([place]), Dyspnea, and Jaundice (04/2017).   Surgical History    Past Surgical History:  Procedure Laterality Date   BREAST BIOPSY Right 08/26/2018   Affirm Bx ("x" clip) path pending   BUNIONECTOMY     COLONOSCOPY     COLONOSCOPY WITH PROPOFOL N/A 05/26/2017   Procedure: COLONOSCOPY WITH  PROPOFOL;  Surgeon: Lucilla Lame, MD;  Location: [facility];  Service: Endoscopy;  Laterality: N/A;  please do not move appt time   ERCP N/A 04/25/2017   Procedure: ENDOSCOPIC RETROGRADE CHOLANGIOPANCREATOGRAPHY (ERCP);  Surgeon: Lucilla Lame, MD;  [facility] END[facility]COPY;  Service: Endoscopy;  Laterality: N/A;   ERCP N/A 09/30/2017   Procedure: ENDOSCOPIC RETROGRADE CHOLANGIOPANCREATOGRAPHY (ERCP) Pancreatic stent removal;  Surgeon: Lucilla Lame, MD;  Location: [facility] ENDOSCOPY;  Service: Endoscopy;  Laterality: N/A;   POLYPECTOMY  05/26/2017   Procedure: POLYPECTOMY;  Surgeon: Lucilla Lame, MD;  Location: [facility];  Service: Endoscopy;;   TONSILLECTOMY       Social History   reports that she quit smoking about 2 weeks ago. Her smoking use included cigarettes. She has a 28.50 pack-year smoking history. She has never used smokeless tobacco. She reports current alcohol use of about 7.0 standard drinks per week. She reports that she does not use drugs.   Family History   Her family history includes Breast cancer (age of onset: 26) in her mother.   Allergies Allergies  Allergen Reactions   Penicillins Hives    Has patient had a PCN reaction causing immediate rash, facial/tongue/throat swelling, SOB or lightheadedness with hypotension: no Has patient had a PCN reaction causing severe rash involving mucus membranes or skin necrosis: no Has patient had a PCN reaction that required hospitalization no Has patient had a PCN reaction occurring within the last 10 years: no If all of the above answers are "NO", then may proceed with Cephalosporin use.      Home Medications  Prior to Admission medications   Medication Sig Start Date End Date Taking? Authorizing Provider  albuterol (PROVENTIL) (2.5 MG/3ML) 0.083% nebulizer solution Take 3 mLs (2.5 mg total) by nebulization every 4 (four) hours as needed for wheezing or shortness of breath. 11/03/21 11/03/22  Naaman Plummer, MD   albuterol (VENTOLIN HFA) 108 (90 Base) MCG/ACT inhaler Inhale 2 puffs into the lungs every 4 (four) hours as needed for shortness of breath.  08/27/16 01/24/19  [provider]  aspirin EC 81 MG tablet Take 81 mg by mouth daily. 10/11/21   [provider]  Fluticasone-Salmeterol (ADVAIR) 250-50 MCG/DOSE AEPB Inhale 1 puff into the lungs 2 (two) times daily.    [provider]  lovastatin (MEVACOR) 40 MG tablet Take 40 mg by mouth daily with supper. 10/11/21   [provider]  OXYGEN Inhale 3 L into the lungs. 1 L when not active    [provider]  tiotropium (SPIRIVA) 18 MCG inhalation capsule Place 18 mcg into inhaler and inhale at bedtime.  08/27/16 01/24/19  [provider]  TURMERIC PO Take by mouth.    [provider]    Scheduled Meds:  amiodarone  400 mg Oral BID   apixaban  5 mg Oral BID   diltiazem  180 mg Oral Daily   fluticasone  2 spray Each Nare Daily   furosemide  20 mg Intravenous Daily   guaiFENesin  1,200 mg Oral BID   ipratropium-albuterol  3 mL Nebulization Q6H   mouth rinse  15 mL Mouth Rinse BID   multivitamin with minerals  1 tablet Oral Daily   pravastatin  40 mg Oral q1800   predniSONE  20 mg Oral Q breakfast   senna-docusate  2 tablet Oral BID   Continuous Infusions:    PRN Meds:.albuterol, ALPRAZolam, bisacodyl, morphine injection, naproxen, polyethylene glycol, senna-docusate, sodium chloride, sodium chloride flush    Assessment & Plan:  Acute on chronic hypercapnic hypoxic respiratory failure secondary to AECOPD  PMHx: Asthma, COPD on Chronic home oxygen at 3L, current everyday smoker  -Supplemental oxygen as needed, maintain SpO2 > 88% -Intermittent chest x-ray & ABG PRN -Ensure adequate pulmonary hygiene -reduced steroids to prednisone - no need for antibiotics   Severe constipation     - s/p enema - no stool     - KUB today   Paroxysmal atrial fibrillation Runs of SVT New since  admission likely in the setting of AECOPD as above -Continue diltiazem 240 daily for rate control and as BP permits -Continue Eliquis 5mg  BID  CHA2DS2VASc  -If uncontrolled with above plan for AAD per cardio recommendations -Replace electrolytes,  Hyperglycemia likely steroid-induced -Check hemoglobin A1c -CBGs -Sliding scale insulin -Follow hyper/hypoglycemia protocol ` Best practice:  Diet:  Oral Pain/Anxiety/Delirium protocol (if indicated): No VAP protocol (if indicated): Not indicated DVT prophylaxis: Systemic AC GI prophylaxis: N/A Glucose control:  SSI Yes Central venous access:  N/A Arterial line:  N/A Foley:  N/A Mobility:  bed rest  PT consulted: Yes Last date of multidisciplinary goals of care discussion [12/4] Code Status:  DNR Disposition: Progressive Care Unit   = Goals of Care = Code Status Order: @CODE @   Primary Emergency ContactAadya, [place], Home Phone: [phone] Patient wishes to pursue ongoing treatment, but concurred that if deteriorated to pulselessness, patient would prefer a natural death as opposed to invasive measures such as CPR and intubation.      Ottie Glazier, M.D.  [facility]

## 2021-11-22 NOTE — Progress Notes (Signed)
Physical Therapy Treatment Patient Details Name: Margaret Stevenson MRN: [id number] DOB: Mar 30, 1946 Today's Date: 11/22/2021   History of Present Illness Pt is a 75 y.o. female with medical history significant for Nicotine dependence and COPD on home oxygen at 3 L who presents to the ED for the second time in 2 days with COPD exacerbation unrelieved with home bronchodilators.  EMS recorded O2 sats in the 80s on her home flow rate and she was placed on CPAP. PMH of COPD, previous melanoma and pulmonary nodules.    PT Comments    Patient received in bed, agrees to PT session. Due to past experience with patient, allowed her to decide what she wanted to do and did not try to push her too much. Patient required min assist to raise trunk to seated position. Able to sit on side of bed x 5 min and performed LAQ in sitting. Required min assist to bring LEs back up onto bed. Then patient performed supine LE exercises with cues. Good tolerance. O2 sats remained in the 90%s throughout session. Patient appreciative and pleasant this session. She declined getting up to recliner. She will continue to benefit from skilled PT while here to improve strength and independence.     Recommendations for follow up therapy are one component of a multi-disciplinary discharge planning process, led by the attending physician.  Recommendations may be updated based on patient status, additional functional criteria and insurance authorization.  Follow Up Recommendations  Skilled nursing-short term rehab (<3 hours/day)     Assistance Recommended at Discharge Frequent or constant Supervision/Assistance  Equipment Recommendations  Other (comment) (TBD at next venue)    Recommendations for Other Services       Precautions / Restrictions Precautions Precautions: Fall Restrictions Weight Bearing Restrictions: No     Mobility  Bed Mobility Overal bed mobility: Needs Assistance Bed Mobility: Supine to Sit;Sit to  Supine     Supine to sit: Min assist;HOB elevated Sit to supine: Min assist;HOB elevated   General bed mobility comments: min assist to raise trunk to seated position and to bring LEs back up onto bed. Sat EOB x 5 min    Transfers                   General transfer comment: patient declined getting up to recliner    Ambulation/Gait                   Stairs             Wheelchair Mobility    Modified Rankin (Stroke Patients Only)       Balance Overall balance assessment: Needs assistance Sitting-balance support: Feet supported;Bilateral upper extremity supported Sitting balance-Leahy Scale: Fair Sitting balance - Comments: steady static sitting balance, requires B UE support                                    Cognition Arousal/Alertness: Awake/alert Behavior During Therapy: Anxious Overall Cognitive Status: Within Functional Limits for tasks assessed                                 General Comments: patient much more pleasant this session. Not agitated at all.        Exercises Other Exercises Other Exercises: B LE strengthening exercises: AP x 25, LAQ, heel slides, hip abd/add, SLR 2x5  reps    General Comments        Pertinent Vitals/Pain Pain Assessment: No/denies pain Breathing: normal Negative Vocalization: none Facial Expression: smiling or inexpressive Body Language: relaxed Consolability: no need to console PAINAD Score: 0    Home Living                          Prior Function            PT Goals (current goals can now be found in the care plan section) Acute Rehab PT Goals Patient Stated Goal: to improve mobility PT Goal Formulation: With patient Time For Goal Achievement: 12/06/21 Potential to Achieve Goals: Fair Progress towards PT goals: Progressing toward goals    Frequency    Min 2X/week      PT Plan Current plan remains appropriate    Co-evaluation               AM-PAC PT "6 Clicks" Mobility   Outcome Measure  Help needed turning from your back to your side while in a flat bed without using bedrails?: A Little Help needed moving from lying on your back to sitting on the side of a flat bed without using bedrails?: A Little Help needed moving to and from a bed to a chair (including a wheelchair)?: A Lot Help needed standing up from a chair using your arms (e.g., wheelchair or bedside chair)?: A Lot Help needed to walk in hospital room?: A Lot Help needed climbing 3-5 steps with a railing? : Total 6 Click Score: 13    End of Session Equipment Utilized During Treatment: Oxygen Activity Tolerance: Patient limited by fatigue Patient left: in bed;with call bell/phone within reach Nurse Communication: Mobility status PT Visit Diagnosis: Other abnormalities of gait and mobility (R26.89);Muscle weakness (generalized) (M62.81)     Time: 1610-9604 PT Time Calculation (min) (ACUTE ONLY): 17 min  Charges:  $Therapeutic Activity: 8-22 mins                     Everlina Gotts, PT, GCS 11/22/21,12:25 PM

## 2021-11-22 NOTE — Progress Notes (Signed)
[facility] NAME: Margaret Stevenson    MR#:  [id number]  DATE OF BIRTH:  June 18, 1946  SUBJECTIVE:  patient more approachable and pleasant today. Told me she wasn't having a good day yesterday  continues to be on 45 L high flow nasal cannula oxygen was sats 96 -100%. Son Margaret Stevenson milligrams at bedside. [place] palliative care nurse practitioner at bedside. REVIEW OF SYSTEMS:   Review of Systems  Constitutional:  Negative for chills, fever and weight loss.  HENT:  Negative for ear discharge, ear pain and nosebleeds.   Eyes:  Negative for blurred vision, pain and discharge.  Respiratory:  Positive for shortness of breath. Negative for sputum production, wheezing and stridor.   Cardiovascular:  Negative for chest pain, palpitations, orthopnea and PND.  Gastrointestinal:  Positive for constipation. Negative for abdominal pain, diarrhea, nausea and vomiting.  Genitourinary:  Negative for frequency and urgency.  Musculoskeletal:  Negative for back pain and joint pain.  Neurological:  Positive for weakness. Negative for sensory change, speech change and focal weakness.  Psychiatric/Behavioral:  Negative for depression and hallucinations. The patient is not nervous/anxious.   Tolerating Diet: Tolerating PT:   DRUG ALLERGIES:   Allergies  Allergen Reactions   Penicillins Hives    Has patient had a PCN reaction causing immediate rash, facial/tongue/throat swelling, SOB or lightheadedness with hypotension: no Has patient had a PCN reaction causing severe rash involving mucus membranes or skin necrosis: no Has patient had a PCN reaction that required hospitalization no Has patient had a PCN reaction occurring within the last 10 years: no If all of the above answers are "NO", then may proceed with Cephalosporin use.     VITALS:  Blood pressure (!) 147/64, pulse 77, temperature 98.1 F (36.7 C), resp. rate 20, height 5' 6.5" (1.689 m), weight 54  kg, SpO2 93 %.  PHYSICAL EXAMINATION:   Physical Exam  GENERAL:  75 y.o.-year-old patient lying in the bed with acute distress. Thin cachectic HEENT: Head atraumatic, normocephalic. Oropharynx and nasopharynx clear.  LUNGS: distant breath sounds bilaterally, no wheezing, rales, rhonchi. CARDIOVASCULAR: S1, S2 normal. No murmurs, rubs, or gallops.  ABDOMEN: Soft, nontender, nondistended. EXTREMITIES: No cyanosis, clubbing or edema b/l.    NEUROLOGIC: nonfocal PSYCHIATRIC:  patient is alert and oriented x 3.  SKIN: No obvious rash, lesion, or ulcer.   LABORATORY PANEL:  CBC Recent Labs  Lab 11/19/21 0805  WBC 17.1*  HGB 13.9  HCT 45.1  PLT 262     Chemistries  Recent Labs  Lab 11/19/21 0805  NA 138  K 4.2  CL 93*  CO2 40*  GLUCOSE 102*  BUN 19  CREATININE <0.30*  CALCIUM 8.1*  MG 2.3    Cardiac Enzymes No results for input(s): TROPONINI in the last 168 hours. RADIOLOGY:  DG Chest Port 1 View  Result Date: 11/21/2021 CLINICAL DATA:  Pneumonia EXAM: PORTABLE CHEST 1 VIEW COMPARISON:  Previous studies including the examination of 11/16/2021 FINDINGS: Transverse diameter of heart is slightly increased.Central pulmonary vessels are prominent without signs of alveolar pulmonary edema. There is possible small calcified granuloma in the left mid lung fields. There is interval worsening of infiltrate in the right lower lung fields. There is possible increase in amount of right pleural effusion. There is improvement in aeration of medial left lower lung fields. No new focal infiltrates are seen in the left lung. Left lateral CP angle is clear. There is no pneumothorax. Low  position of diaphragms suggests COPD. IMPRESSION: There is interval worsening of infiltrate in right lower lung fields suggesting worsening atelectasis/pneumonia. There is interval improvement in aeration in the medial left lower lung fields suggesting resolving subsegmental atelectasis/pneumonia. Small right  pleural effusion with interval increase. Electronically Signed   By: Elmer Picker M.D.   On: 11/21/2021 12:25   ASSESSMENT AND PLAN:   Margaret Stevenson is a 75 y.o. female with medical history significant for COPD, chronic hypoxemic respiratory failure, pulmonary nodules, history of melanoma, who presented to the hospital because of difficulty breathing    Acute on chronic hypoxemic respiratory failure:  --She is not getting any better.  Oxygen requirement has gone up to oxygen via HFNC, 85% 45 L/min.  She had been on 65% for some time.  Difficult to wean oxygen. 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 She has been mentioning to the staff and also to delete that she is going to die here. -- Palliative care has been following. Discussed case with Dr.aleskerov and he is in agreement with hospice care if patient agrees to it. Will discuss with son. -- Chest x-ray from today does not show much improved --12/15-- I along with [facility] palliative care nurse practitioner patient's son Margaret Stevenson and patient had a long discussion regarding her end-stage severe COPD. She understands there is not much left to do as far as treatment. Discussed option about hospice facility and hospice at home. Discussed about morphine for symptom management. Also discussed about not chasing the oxygen saturation numbers and more symptom management. Patient is agreeable with starting morphine. Will continue to monitor and eventually work with her regarding discharging to hospice facility if she prefers that way. [facility] hospice aware --Dr Lanney Gins agrees with above.   End-stage severe COPD exacerbation: Chest x-ray on 11/16/2021 showed increased interstitial markings. Continue steroids and bronchodilators. No additional antibiotics needed per pulmonologist..  Completed 5 days of azithromycin.    Paroxysmal atrial fibrillation with RVR: She still in A. fib with fluctuating heart rate.  Continue  diltiazem, amiodarone and Eliquis.     Hypokalemia: Improved   Constipation: Continue laxatives.  --- Patient had good BM today per  Pulmonary nodules: PET scan in April 2022 suggestive of benign etiology.  Outpatient follow-up with CT scan for continued surveillance recommended.   Tobacco use disorder: Counseled to quit smoking. Nutrition Status: Nutrition Problem: Moderate Malnutrition Etiology: chronic illness (COPD) Signs/Symptoms: mild fat depletion, moderate fat depletion, mild muscle depletion, moderate muscle depletion Interventions: MVI, Magic cup  Family communication :Margaret Stevenson son at bedside Consults : pulmonary, palliative CODE STATUS: DNR DVT Prophylaxis : Level of care: Progressive Status is: Inpatient  Remains inpatient appropriate because: poor respiratory status        TOTAL TIME TAKING CARE OF THIS PATIENT: 25 minutes.  >50% time spent on counselling and coordination of care  Note: This dictation was prepared with Dragon dictation along with smaller phrase technology. Any transcriptional errors that result from this process are unintentional.  Fritzi Mandes M.D    [facility]   CC: Primary care physician; Dion Body, MD Patient ID: Margaret Stevenson, female   DOB: 1946/10/17, 75 y.o.   MRN: [id number]

## 2021-11-22 NOTE — Progress Notes (Signed)
Palliative: Detailed discussion with attending, Dr. Posey Pronto.  Margaret Stevenson is lying quietly in bed.  She appears acutely/chronically ill and quite frail.  She is alert and oriented, able to make her needs known.  Her son, Margaret Stevenson, is at bedside at this time.    Interdisciplinary meeting with attending, Dr. Posey Pronto, Margaret Stevenson and her son, Margaret Stevenson.  We talked about Margaret Stevenson acute health concerns/respiratory failure.  We talked about her oxygen requirements.  She tells me that when she checks her pulse ox her saturation is 94 to 96%.  I share that for people with her stage of illness 88 to 90% would be acceptable.  She tells me that she "gasps" at 88.  We talked about managing her symptoms more than the numbers.    We talked in detail about symptom management, the benefits of scheduled morphine for work of breathing/breathlessness.  At this point she is agreeable to scheduled by mouth low-dose morphine, also as needed morphine.  Orders adjusted.  After 36 to 48 hours, total amount of morphine used should be divided into a split dose for by mouth tablet twice daily along with continuing as needed morphine.  We talked about this in detail and both Margaret Stevenson and her son are agreeable to start symptom management with morphine.  I also encouraged her to use her as needed anxiety medications.  Margaret Stevenson shares that she spoke with her pulmonologist, Margaret Stevenson, earlier today.  She tells [place] that pulmonology has recommended hospice care.  We talked about hospice care, what is and is not provided at different locations.  At this point, Margaret Stevenson high FiO2 needs restrict her from leaving the hospital.  At this point, both she and son Margaret Stevenson agree that she is unable to return to her own home.  She does ask about around-the-clock care in her home which would likely be around $8-$10,000 per month.  We talked in detail about residential hospice.  Margaret Stevenson shares that she has been to the hospice home and has  relationship with 1 of its founders.  She states [facility] on [address]. would be her location of choice.  I share that after her symptom management needs are met, we would have hospice provider come and speak with her about the details of residential hospice.  We discussed  We talked about prognosis.  I shared that it is often difficult for prognostication for those with end-stage COPD.  I share what tells me more, at this point, is her functional status.  We discussed transferring to residential hospice "to let nature take its course", and that people must have an expected prognosis of 2 weeks or less likely to be accepted.  The medical team is awaiting Margaret Stevenson to tell us when she is ready for transitioning to comfort care.  She states that her main fear is that she will struggle to breathe.  Conference with attending, bedside nursing staff, pulmonology, transition of care team, local hospice representative related to patient condition, needs, goals of care, disposition.  Plan: Symptom management with scheduled/as needed morphine.  Goal is to reduce oxygen to 12 to 15 L/min.  The medical team is awaiting Margaret Stevenson to tell us when she is ready for transitioning to comfort care.  64 minutes Quinn Axe, NP Palliative medicine team Team phone [phone] Greater than 50% of this time was spent counseling and coordinating care related to the above assessment and plan.

## 2021-11-23 MED ORDER — MORPHINE SULFATE (PF) 2 MG/ML IV SOLN
1.0000 mg | INTRAVENOUS | Status: DC | PRN
  Administered 2021-11-26 – 2021-11-27 (×3): 1 mg via INTRAVENOUS
  Filled 2021-11-23 (×3): qty 1

## 2021-11-23 NOTE — Progress Notes (Addendum)
[facility] [place] NAME: Margaret Stevenson    MR#:  [id number]  DATE OF BIRTH:  1946/07/19  SUBJECTIVE:  patient son Barnabas Lister at bedside. Questions written on the paper answered to my best ability. Questions about oxygen here and at hospice facility also answered. Patient appears to be more anxious at present. Discussed options about giving morphine IV and gtt if need be She is agreeable with IV PRN morphine and continue oral morphine at present. REVIEW OF SYSTEMS:   Review of Systems  Constitutional:  Negative for chills, fever and weight loss.  HENT:  Negative for ear discharge, ear pain and nosebleeds.   Eyes:  Negative for blurred vision, pain and discharge.  Respiratory:  Positive for shortness of breath. Negative for sputum production, wheezing and stridor.   Cardiovascular:  Negative for chest pain, palpitations, orthopnea and PND.  Gastrointestinal:  Negative for abdominal pain, diarrhea, nausea and vomiting.  Genitourinary:  Negative for frequency and urgency.  Musculoskeletal:  Negative for back pain and joint pain.  Neurological:  Positive for weakness. Negative for sensory change, speech change and focal weakness.  Psychiatric/Behavioral:  Negative for depression and hallucinations. The patient is nervous/anxious.     DRUG ALLERGIES:   Allergies  Allergen Reactions   Penicillins Hives    Has patient had a PCN reaction causing immediate rash, facial/tongue/throat swelling, SOB or lightheadedness with hypotension: no Has patient had a PCN reaction causing severe rash involving mucus membranes or skin necrosis: no Has patient had a PCN reaction that required hospitalization no Has patient had a PCN reaction occurring within the last 10 years: no If all of the above answers are "NO", then may proceed with Cephalosporin use.     VITALS:  Blood pressure (!) 126/59, pulse 72, temperature 98 F (36.7 C), resp. rate 15, height 5' 6.5"  (1.689 m), weight 54 kg, SpO2 92 %.  PHYSICAL EXAMINATION:   Physical Exam  GENERAL:  75 y.o.-year-old patient lying in the bed with acute distress. Thin cachectic HEENT: Head atraumatic, normocephalic.   LUNGS: distant breath sounds bilaterally, no wheezing, rales, rhonchi. CARDIOVASCULAR: S1, S2 normal. No murmurs, rubs, or gallops.  ABDOMEN: Soft, nontender, nondistended. EXTREMITIES: No cyanosis, clubbing or edema b/l.    NEUROLOGIC: nonfocal PSYCHIATRIC:  patient is alert and oriented x 3.  SKIN: No obvious rash, lesion, or ulcer.   LABORATORY PANEL:  CBC Recent Labs  Lab 11/19/21 0805  WBC 17.1*  HGB 13.9  HCT 45.1  PLT 262     Chemistries  Recent Labs  Lab 11/19/21 0805  NA 138  K 4.2  CL 93*  CO2 40*  GLUCOSE 102*  BUN 19  CREATININE <0.30*  CALCIUM 8.1*  MG 2.3    Cardiac Enzymes No results for input(s): TROPONINI in the last 168 hours. RADIOLOGY:  No results found. ASSESSMENT AND PLAN:   Margaret Stevenson is a 75 y.o. female with medical history significant for COPD, chronic hypoxemic respiratory failure, pulmonary nodules, history of melanoma, who presented to the hospital because of difficulty breathing    Acute on chronic hypoxemic respiratory failure:  --She is not getting any better.  Oxygen requirement has gone up to oxygen via HFNC, 85% 45 L/min.  She had been on 65% for some time.  Difficult to wean oxygen. 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 She has been mentioning to the staff and also to delete that she is going to die here. --  Palliative care has been following. Discussed case with Dr.aleskerov and he is in agreement with hospice care if patient agrees to it. Will discuss with son. -- Chest x-ray from today does not show much improved --12/15-- I along with [facility] palliative care nurse practitioner patient's son Janelli Welling and patient had a long discussion regarding her end-stage severe COPD. She understands there is  not much left to do as far as treatment. Discussed option about hospice facility and hospice at home. Discussed about morphine for symptom management. Also discussed about not chasing the oxygen saturation numbers and more symptom management. Patient is agreeable with starting morphine. Will continue to monitor and eventually work with her regarding discharging to hospice facility if she prefers that way. [facility] hospice aware --Dr Lanney Gins agrees with above. --12/16-- morphine 1 mg IV Q2 PRN added. Continue oral morphine. Answered questions about oxygen here and hospice facility in case patient decides that route. Son at bedside. Patient is not want physical therapy/occupational therapy. Orders discontinued   End-stage severe COPD exacerbation: Chest x-ray on 11/16/2021 showed increased interstitial markings. Continue steroids and bronchodilators. No additional antibiotics needed per pulmonologist..  Completed 5 days of azithromycin.  Paroxysmal atrial fibrillation with RVR: She still in A. fib with fluctuating heart rate.  Continue diltiazem, amiodarone and Eliquis.     Hypokalemia: Improved   Constipation: Continue laxatives.  --- Patient had good BM today per  Pulmonary nodules: PET scan in April 2022 suggestive of benign etiology.  Outpatient follow-up with CT scan for continued surveillance recommended.   Tobacco use disorder: Counseled to quit smoking. Nutrition Status: Nutrition Problem: Moderate Malnutrition Etiology: chronic illness (COPD) Signs/Symptoms: mild fat depletion, moderate fat depletion, mild muscle depletion, moderate muscle depletion Interventions: MVI, Magic cup  Family communication :Barnabas Lister son at bedside Consults : pulmonary, palliative CODE STATUS: DNR DVT Prophylaxis : Level of care: Progressive Status is: Inpatient  Remains inpatient appropriate because: poor respiratory status still requiring significant amount of oxygen.        TOTAL TIME TAKING  CARE OF THIS PATIENT: 25 minutes.  >50% time spent on counselling and coordination of care  Note: This dictation was prepared with Dragon dictation along with smaller phrase technology. Any transcriptional errors that result from this process are unintentional.  Fritzi Mandes M.D    [facility]   CC: Primary care physician; Dion Body, MD Patient ID: Gerilynn Mccullars, female   DOB: Mar 24, 1946, 75 y.o.   MRN: [id number]

## 2021-11-23 NOTE — Telephone Encounter (Signed)
Patient currently admitted

## 2021-11-24 MED ORDER — AMIODARONE HCL 200 MG PO TABS
200.0000 mg | ORAL_TABLET | Freq: Two times a day (BID) | ORAL | Status: DC
  Administered 2021-11-24 – 2021-11-25 (×2): 200 mg via ORAL
  Filled 2021-11-24 (×2): qty 1

## 2021-11-24 NOTE — Progress Notes (Signed)
Dr. Posey Pronto gave order to hold cardizem this morning for BP 111/52.

## 2021-11-24 NOTE — Consult Note (Signed)
NAME:  Margaret Stevenson, MRN:  [id number], DOB:  [birth year], LOS: 20 ADMISSION DATE:  11/05/2021, CONSULTATION DATE: 11/11/2021 REFERRING MD: Irine Seal, MD, CHIEF COMPLAINT: Persistent hypoxia  Brief Patient Report  75 y.o. female w/ a h/o COPD and tob abuse, who was admitted 11/28 w/ resp failure and COPD w/ finding of PAF. HPI  75 year old female with past medical history of COPD, previous melanoma and pulmonary nodules who was admitted on 11/05/2021 with difficulty breathing.  CT scan of the chest on 11/03/2021 after an ER visit that was negative for pulmonary embolism and showed stable pulmonary nodules. She presented back to the ED with worsening respiratory symptoms.  11/24/21- patient is on 14L/min [place] and clinically improved. Son wants to re-discuss hospice since patient is improved substantially. He wants to continue her eliquis and cardiac regimen.   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   Elevated CA 19-9 level 05/08/2017   Arthritis 05/07/2017   Asthma without status asthmaticus 05/07/2017   Borderline diabetes 05/07/2017   Hx of adenomatous colonic polyps 05/07/2017   Tobacco dependence 05/07/2017   Sepsis ([place]) 04/27/2017   Disease of biliary tract     Obstruction of bile duct     Jaundice     Hypoxia 10/11/2016   Anxiety 10/11/2016   COPD (chronic obstructive pulmonary disease) ([place]) 10/06/2016   CAP (community acquired pneumonia) 10/06/2016   Hyperglycemia 10/06/2016   Vaccine counseling 06/21/2016   History of malignant melanoma of skin 08/03/2013    Significant  Hospital Events   11/28: Admitted to progressive care unit with acute on chronic respiratory failure secondary to COPD exacerbation/anxiety 11/30: Continue to wean oxygen to 5 L/min. Cardiology consulted for Afib with RVR 12/1: Patient with increased oxygen requirement 8 to 9 L HFNC failed weaning. 12/2: Remains on high flow nasal cannula at 8 to 9 L to keep sats greater than 90% 12/3: Still requiring high oxygen also in A. fib with RVR 12/4:  PCCM consulted Consults:  Cardiology PCCM  Procedures:  None  Significant Diagnostic Tests:  11/28: Chest Xray> no acute cardiopulmonary process, emphysema 12/4: 7 mm right upper lobe pulmonary nodule, better demonstrated on recent chest CT. 2. Peribronchial opacities with lower lobe predominance may represent bronchitic changes or interstitial pulmonary edema. 3. Emphysema  Micro Data:  11/28: SARS-CoV-2 PCR> negative 11/28: Influenza PCR> negative  Antimicrobials:  None  OBJECTIVE  Blood pressure 130/62, pulse 77, temperature 98.2 F (36.8 C), temperature source Oral, resp. rate 19, height 5' 6.5" (1.689 m), weight 54 kg, SpO2 95 %.    FiO2 (%):  [45 %-75 %] 45 %   Intake/Output Summary (Last 24 hours) at 11/24/2021 1358 Last data filed at 11/23/2021 2100 Gross per 24 hour  Intake 240 ml  Output --  Net 240 ml    Filed Weights   11/17/21 1200  Weight: 54 kg    Physical Examination  GENERAL: 76 year-old patient lying in the bed with no acute distress.  EYES: Pupils equal, round, reactive to light and accommodation.  No scleral icterus. Extraocular muscles intact.  HEENT: Head atraumatic, normocephalic. Oropharynx and nasopharynx clear.  NECK:  Supple, no jugular venous distention. No thyroid enlargement, no tenderness.  LUNGS: Decreased breath sounds bilaterally, no wheezing, rales,rhonchi or crepitation. No use of accessory muscles of respiration.  CARDIOVASCULAR: S1, S2 normal. No murmurs, rubs, or gallops.  ABDOMEN: Soft,  nontender, nondistended. Bowel sounds present. No organomegaly or mass.  EXTREMITIES: No pedal edema, cyanosis, or clubbing.  NEUROLOGIC: Cranial nerves II through XII are intact.  Muscle strength 5/5 in all extremities. Sensation intact. Gait not checked.  PSYCHIATRIC: The patient is alert and oriented x 3.  SKIN: No obvious rash, lesion, or ulcer.   Labs/imaging that I havepersonally reviewed  (right click and "Reselect all SmartList Selections" daily)     Labs   CBC: Recent Labs  Lab 11/19/21 0805  WBC 17.1*  NEUTROABS 13.6*  HGB 13.9  HCT 45.1  MCV 100.9*  PLT 262     Basic Metabolic Panel: Recent Labs  Lab 11/19/21 0805  NA 138  K 4.2  CL 93*  CO2 40*  GLUCOSE 102*  BUN 19  CREATININE <0.30*  CALCIUM 8.1*  MG 2.3    GFR: CrCl cannot be calculated (This lab value cannot be used to calculate CrCl because it is not a number: <0.30). Recent Labs  Lab 11/19/21 0805  WBC 17.1*     Liver Function Tests: No results for input(s): AST, ALT, ALKPHOS, BILITOT, PROT, ALBUMIN in the last 168 hours. No results for input(s): LIPASE, AMYLASE in the last 168 hours. No results for input(s): AMMONIA in the last 168 hours.  ABG    Component Value Date/Time   PHART 7.44 11/12/2021 0847   PCO2ART 68 (HH) 11/12/2021 0847   PO2ART 53 (L) 11/12/2021 0847   HCO3 46.2 (H) 11/12/2021 0847   O2SAT 88.3 11/12/2021 0847     Coagulation Profile: No results for input(s): INR, PROTIME in the last 168 hours.  Cardiac Enzymes: No results for input(s): CKTOTAL, CKMB, CKMBINDEX, TROPONINI in the last 168 hours.  HbA1C: Hgb A1c MFr Bld  Date/Time Value Ref Range Status  04/27/2017 12:17 AM 4.5 (L) 4.8 - 5.6 % Final    Comment:    (NOTE)         Pre-diabetes: 5.7 - 6.4         Diabetes: >6.4         Glycemic control for adults with diabetes: <7.0     CBG: No results for input(s): GLUCAP in the last 168 hours.  Review of Systems:   Review of Systems  Constitutional:  Negative.   HENT: Negative.    Eyes: Negative.   Respiratory:  Positive for cough, sputum production, shortness of breath and wheezing. Negative for hemoptysis.   Cardiovascular:  Positive for palpitations, orthopnea and leg swelling. Negative for chest pain, claudication and PND.  Gastrointestinal: Negative.   Genitourinary: Negative.   Musculoskeletal:  Positive for back pain and neck pain.  Skin: Negative.   Neurological: Negative.   Endo/Heme/Allergies: Negative.   Psychiatric/Behavioral:  The patient is nervous/anxious.     Past Medical History  She,  has a past medical history of Arthritis, Cancer ([place]), COPD (chronic obstructive pulmonary disease) ([place]), Dyspnea, and Jaundice (04/2017).   Surgical History    Past Surgical History:  Procedure Laterality Date   BREAST BIOPSY Right 08/26/2018   Affirm Bx ("x" clip) path pending   BUNIONECTOMY     COLONOSCOPY  COLONOSCOPY WITH PROPOFOL N/A 05/26/2017   Procedure: COLONOSCOPY WITH PROPOFOL;  Surgeon: Lucilla Lame, MD;  Location: [facility];  Service: Endoscopy;  Laterality: N/A;  please do not move appt time   ERCP N/A 04/25/2017   Procedure: ENDOSCOPIC RETROGRADE CHOLANGIOPANCREATOGRAPHY (ERCP);  Surgeon: Lucilla Lame, MD;  Location[facility] ENDOSCOPY;  Service: Endoscopy;  Laterality: N/A;   ERCP N/A 09/30/2017   Procedure: ENDOSCOPIC RETROGRADE CHOLANGIOPANCREATOGRAPHY (ERCP) Pancreatic stent removal;  Surgeon: Lucilla Lame, MD;  Location: [facility] ENDOSCOPY;  Service: Endoscopy;  Laterality: N/A;   POLYPECTOMY  05/26/2017   Procedure: POLYPECTOMY;  Surgeon: Lucilla Lame, MD;  Location: [facility];  Service: Endoscopy;;   TONSILLECTOMY       Social History   reports that she quit smoking about 3 weeks ago. Her smoking use included cigarettes. She has a 28.50 pack-year smoking history. She has never used smokeless tobacco. She reports current alcohol use of about 7.0 standard drinks per week. She reports that she  does not use drugs.   Family History   Her family history includes Breast cancer (age of onset: 67) in her mother.   Allergies Allergies  Allergen Reactions   Penicillins Hives    Has patient had a PCN reaction causing immediate rash, facial/tongue/throat swelling, SOB or lightheadedness with hypotension: no Has patient had a PCN reaction causing severe rash involving mucus membranes or skin necrosis: no Has patient had a PCN reaction that required hospitalization no Has patient had a PCN reaction occurring within the last 10 years: no If all of the above answers are "NO", then may proceed with Cephalosporin use.      Home Medications  Prior to Admission medications   Medication Sig Start Date End Date Taking? Authorizing Provider  albuterol (PROVENTIL) (2.5 MG/3ML) 0.083% nebulizer solution Take 3 mLs (2.5 mg total) by nebulization every 4 (four) hours as needed for wheezing or shortness of breath. 11/03/21 11/03/22  Naaman Plummer, MD  albuterol (VENTOLIN HFA) 108 (90 Base) MCG/ACT inhaler Inhale 2 puffs into the lungs every 4 (four) hours as needed for shortness of breath.  08/27/16 01/24/19  [provider]  aspirin EC 81 MG tablet Take 81 mg by mouth daily. 10/11/21   [provider]  Fluticasone-Salmeterol (ADVAIR) 250-50 MCG/DOSE AEPB Inhale 1 puff into the lungs 2 (two) times daily.    [provider]  lovastatin (MEVACOR) 40 MG tablet Take 40 mg by mouth daily with supper. 10/11/21   [provider]  OXYGEN Inhale 3 L into the lungs. 1 L when not active    [provider]  tiotropium (SPIRIVA) 18 MCG inhalation capsule Place 18 mcg into inhaler and inhale at bedtime.  08/27/16 01/24/19  [provider]  TURMERIC PO Take by mouth.    [provider]    Scheduled Meds:  amiodarone  200 mg Oral BID   apixaban  5 mg Oral BID   diltiazem  180 mg Oral Daily   fluticasone  2 spray Each Nare Daily   guaiFENesin  1,200 mg  Oral BID   ipratropium-albuterol  3 mL Nebulization Q6H   mouth rinse  15 mL Mouth Rinse BID   morphine  2.5 mg Oral Q4H   multivitamin with minerals  1 tablet Oral Daily   pravastatin  40 mg Oral q1800   predniSONE  20 mg Oral Q breakfast   senna-docusate  1 tablet Oral BID   Continuous Infusions:    PRN Meds:.albuterol, ALPRAZolam, bisacodyl, morphine  injection, naproxen, sodium chloride, sodium chloride flush    Assessment & Plan:  Acute on chronic hypercapnic hypoxic respiratory failure secondary to AECOPD  PMHx: Asthma, COPD on Chronic home oxygen at 3L, current everyday smoker  -Supplemental oxygen as needed, maintain SpO2 > 88% -Intermittent chest x-ray & ABG PRN -Ensure adequate pulmonary hygiene -reduced steroids to prednisone - no need for antibiotics   Severe constipation     - s/p enema - no stool     - KUB today   Paroxysmal atrial fibrillation Runs of SVT New since admission likely in the setting of AECOPD as above -Continue diltiazem 240 daily for rate control and as BP permits -Continue Eliquis 5mg  BID  CHA2DS2VASc  -If uncontrolled with above plan for AAD per cardio recommendations -Replace electrolytes,  Hyperglycemia likely steroid-induced -Check hemoglobin A1c -CBGs -Sliding scale insulin -Follow hyper/hypoglycemia protocol ` Best practice:  Diet:  Oral Pain/Anxiety/Delirium protocol (if indicated): No VAP protocol (if indicated): Not indicated DVT prophylaxis: Systemic AC GI prophylaxis: N/A Glucose control:  SSI Yes Central venous access:  N/A Arterial line:  N/A Foley:  N/A Mobility:  bed rest  PT consulted: Yes Last date of multidisciplinary goals of care discussion [12/4] Code Status:  DNR Disposition: Progressive Care Unit   = Goals of Care = Code Status Order: @CODE @   Primary Emergency ContactHelma, [place], Home Phone: [phone] Patient wishes to pursue ongoing treatment, but concurred that if deteriorated to  pulselessness, patient would prefer a natural death as opposed to invasive measures such as CPR and intubation.      Ottie Glazier, M.D.  [facility] [place]

## 2021-11-24 NOTE — Progress Notes (Signed)
Margaret Stevenson at [place] NAME: Margaret Stevenson    MR#:  [id number]  DATE OF BIRTH:  Oct 31, 1946  SUBJECTIVE:  patient's friend from [facility] is visiting was at bedside.  Patient high flow nasal cannula oxygen has been dropped down to 14 L. She is tolerating it well. She wants to continue all her current meds. REVIEW OF SYSTEMS:   Review of Systems  Constitutional:  Negative for chills, fever and weight loss.  HENT:  Negative for ear discharge, ear pain and nosebleeds.   Eyes:  Negative for blurred vision, pain and discharge.  Respiratory:  Positive for shortness of breath. Negative for sputum production, wheezing and stridor.   Cardiovascular:  Negative for chest pain, palpitations, orthopnea and PND.  Gastrointestinal:  Negative for abdominal pain, diarrhea, nausea and vomiting.  Genitourinary:  Negative for frequency and urgency.  Musculoskeletal:  Negative for back pain and joint pain.  Neurological:  Positive for weakness. Negative for sensory change, speech change and focal weakness.  Psychiatric/Behavioral:  Negative for depression and hallucinations. The patient is nervous/anxious.     DRUG ALLERGIES:   Allergies  Allergen Reactions   Penicillins Hives    Has patient had a PCN reaction causing immediate rash, facial/tongue/throat swelling, SOB or lightheadedness with hypotension: no Has patient had a PCN reaction causing severe rash involving mucus membranes or skin necrosis: no Has patient had a PCN reaction that required hospitalization no Has patient had a PCN reaction occurring within the last 10 years: no If all of the above answers are "NO", then may proceed with Cephalosporin use.     VITALS:  Blood pressure 130/62, pulse 77, temperature 98.2 F (36.8 C), temperature source Oral, resp. rate 19, height 5' 6.5" (1.689 m), weight 54 kg, SpO2 95 %.  PHYSICAL EXAMINATION:   Physical Exam  GENERAL:  75  y.o.-year-old patient lying in the bed with acute distress. Thin cachectic HEENT: Head atraumatic, normocephalic.   LUNGS: distant breath sounds bilaterally, no wheezing, rales, rhonchi. CARDIOVASCULAR: S1, S2 normal. No murmurs, rubs, or gallops.  ABDOMEN: Soft, nontender, nondistended. EXTREMITIES: No cyanosis, clubbing or edema b/l.    NEUROLOGIC: nonfocal PSYCHIATRIC:  patient is alert and oriented x 3.  SKIN: No obvious rash, lesion, or ulcer.   LABORATORY PANEL:  CBC Recent Labs  Lab 11/19/21 0805  WBC 17.1*  HGB 13.9  HCT 45.1  PLT 262     Chemistries  Recent Labs  Lab 11/19/21 0805  NA 138  K 4.2  CL 93*  CO2 40*  GLUCOSE 102*  BUN 19  CREATININE <0.30*  CALCIUM 8.1*  MG 2.3    Cardiac Enzymes No results for input(s): TROPONINI in the last 168 hours. RADIOLOGY:  No results found. ASSESSMENT AND PLAN:   Margaret Stevenson is a 75 y.o. female with medical history significant for COPD, chronic hypoxemic respiratory failure, pulmonary nodules, history of melanoma, who presented to the hospital because of difficulty breathing    Acute on chronic hypoxemic respiratory failure:  --She is not getting any better.  Oxygen requirement has gone up to oxygen via HFNC, 85% 45 L/min.  She had been on 65% for some time.  Difficult to wean oxygen. 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 She has been mentioning to the staff and also to delete that she is going to die here. -- Palliative care has been following. Discussed case with Dr.aleskerov and he is in agreement with hospice care  if patient agrees to it. Will discuss with son. -- Chest x-ray from today does not show much improved --12/15-- I along with [facility] palliative care nurse practitioner patient's son [name] and patient had a long discussion regarding her end-stage severe COPD. She understands there is not much left to do as far as treatment. Discussed option about hospice facility and hospice at  home. Discussed about morphine for symptom management. Also discussed about not chasing the oxygen saturation numbers and more symptom management. Patient is agreeable with starting morphine. Will continue to monitor and eventually work with her regarding discharging to hospice facility if she prefers that way. Authorial care hospice aware --Dr Lanney Gins agrees with above. --12/16-- morphine 1 mg IV Q2 PRN added. Continue oral morphine. Answered questions about oxygen here and hospice facility in case patient decides that route. Son at bedside. Patient is not want physical therapy/occupational therapy. Orders discontinued  --12/17-- On 14L HFNC oxygen. Less anxious. Tolerating po morphine  End-stage severe COPD exacerbation: Chest x-ray on 11/16/2021 showed increased interstitial markings. Continue steroids and bronchodilators. No additional antibiotics needed per pulmonologist..  Completed 5 days of azithromycin.  Paroxysmal atrial fibrillation with RVR: She still in A. fib with fluctuating heart rate.  Continue diltiazem, amiodarone and Eliquis.     Hypokalemia: Improved   Constipation: Continue laxatives.  --- Patient had good BM   Pulmonary nodules: PET scan in April 2022 suggestive of benign etiology.    Tobacco use disorder: Counseled to quit smoking. Nutrition Status: Nutrition Problem: Moderate Malnutrition Etiology: chronic illness (COPD) Signs/Symptoms: mild fat depletion, moderate fat depletion, mild muscle depletion, moderate muscle depletion Interventions: MVI, Magic cup  Family communication :Barnabas Lister son at bedside Consults : pulmonary, palliative CODE STATUS: DNR DVT Prophylaxis : Level of care: Progressive Status is: Inpatient  Remains inpatient appropriate because: poor respiratory status still requiring significant amount of oxygen.        TOTAL TIME TAKING CARE OF THIS PATIENT: 25 minutes.  >50% time spent on counselling and coordination of care  Note: This  dictation was prepared with Dragon dictation along with smaller phrase technology. Any transcriptional errors that result from this process are unintentional.  Fritzi Mandes M.D    [facility]   CC: Primary care physician; Dion Body, MD Patient ID: Somaly Marteney, female   DOB: [birth year], 75 y.o.   MRN: [id number]

## 2021-11-25 MED ORDER — AMIODARONE HCL 200 MG PO TABS
400.0000 mg | ORAL_TABLET | Freq: Two times a day (BID) | ORAL | Status: DC
  Administered 2021-11-25 – 2021-11-27 (×4): 400 mg via ORAL
  Filled 2021-11-25 (×4): qty 2

## 2021-11-25 NOTE — Consult Note (Signed)
NAME:  Jannie Doyle, MRN:  [id number], DOB:  [birth year], LOS: 24 ADMISSION DATE:  11/05/2021, CONSULTATION DATE: 11/11/2021 REFERRING MD: Irine Seal, MD, CHIEF COMPLAINT: Persistent hypoxia  Brief Patient Report  75 y.o. female w/ a h/o COPD and tob abuse, who was admitted 11/28 w/ resp failure and COPD w/ finding of PAF. HPI  75 year old female with past medical history of COPD, previous melanoma and pulmonary nodules who was admitted on 11/05/2021 with difficulty breathing.  CT scan of the chest on 11/03/2021 after an ER visit that was negative for pulmonary embolism and showed stable pulmonary nodules. She presented back to the ED with worsening respiratory symptoms.  11/25/21- patient worked with PT and was able to get up.  She wants to re-consider hospice.   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   Elevated CA 19-9 level 05/08/2017   Arthritis 05/07/2017   Asthma without status asthmaticus 05/07/2017   Borderline diabetes 05/07/2017   Hx of adenomatous colonic polyps 05/07/2017   Tobacco dependence 05/07/2017   Sepsis ([place]) 04/27/2017   Disease of biliary tract     Obstruction of bile duct     Jaundice     Hypoxia 10/11/2016   Anxiety 10/11/2016   COPD (chronic obstructive pulmonary disease) ([place]) 10/06/2016   CAP (community acquired pneumonia) 10/06/2016   Hyperglycemia 10/06/2016   Vaccine counseling 06/21/2016   History of malignant melanoma of skin 08/03/2013    Significant Hospital Events   11/28: Admitted to progressive care unit with acute on chronic respiratory  failure secondary to COPD exacerbation/anxiety 11/30: Continue to wean oxygen to 5 L/min. Cardiology consulted for Afib with RVR 12/1: Patient with increased oxygen requirement 8 to 9 L HFNC failed weaning. 12/2: Remains on high flow nasal cannula at 8 to 9 L to keep sats greater than 90% 12/3: Still requiring high oxygen also in A. fib with RVR 12/4:  PCCM consulted Consults:  Cardiology PCCM  Procedures:  None  Significant Diagnostic Tests:  11/28: Chest Xray> no acute cardiopulmonary process, emphysema 12/4: 7 mm right upper lobe pulmonary nodule, better demonstrated on recent chest CT. 2. Peribronchial opacities with lower lobe predominance may represent bronchitic changes or interstitial pulmonary edema. 3. Emphysema  Micro Data:  11/28: SARS-CoV-2 PCR> negative 11/28: Influenza PCR> negative  Antimicrobials:  None  OBJECTIVE  Blood pressure (!) 107/96, pulse 81, temperature 97.6 F (36.4 C), temperature source Oral, resp. rate 18, height 5' 6.5" (1.689 m), weight 54 kg, SpO2 93 %.    FiO2 (%):  [45 %] 45 %   Intake/Output Summary (Last 24 hours) at 11/25/2021 0805 Last data filed at 11/24/2021 1845 Gross per 24 hour  Intake --  Output 450 ml  Net -450 ml    Filed Weights   11/17/21 1200  Weight: 54 kg    Physical Examination  GENERAL: 75 year-old patient lying in the bed with no acute distress.  EYES: Pupils equal, round, reactive to light and accommodation. No scleral icterus. Extraocular muscles intact.  HEENT: Head atraumatic, normocephalic.  Oropharynx and nasopharynx clear.  NECK:  Supple, no jugular venous distention. No thyroid enlargement, no tenderness.  LUNGS: Decreased breath sounds bilaterally, no wheezing, rales,rhonchi or crepitation. No use of accessory muscles of respiration.  CARDIOVASCULAR: S1, S2 normal. No murmurs, rubs, or gallops.  ABDOMEN: Soft, nontender, nondistended. Bowel sounds present. No organomegaly or mass.  EXTREMITIES: No  pedal edema, cyanosis, or clubbing.  NEUROLOGIC: Cranial nerves II through XII are intact.  Muscle strength 5/5 in all extremities. Sensation intact. Gait not checked.  PSYCHIATRIC: The patient is alert and oriented x 3.  SKIN: No obvious rash, lesion, or ulcer.   Labs/imaging that I havepersonally reviewed  (right click and "Reselect all SmartList Selections" daily)     Labs   CBC: Recent Labs  Lab 11/19/21 0805  WBC 17.1*  NEUTROABS 13.6*  HGB 13.9  HCT 45.1  MCV 100.9*  PLT 262     Basic Metabolic Panel: Recent Labs  Lab 11/19/21 0805  NA 138  K 4.2  CL 93*  CO2 40*  GLUCOSE 102*  BUN 19  CREATININE <0.30*  CALCIUM 8.1*  MG 2.3    GFR: CrCl cannot be calculated (This lab value cannot be used to calculate CrCl because it is not a number: <0.30). Recent Labs  Lab 11/19/21 0805  WBC 17.1*     Liver Function Tests: No results for input(s): AST, ALT, ALKPHOS, BILITOT, PROT, ALBUMIN in the last 168 hours. No results for input(s): LIPASE, AMYLASE in the last 168 hours. No results for input(s): AMMONIA in the last 168 hours.  ABG    Component Value Date/Time   PHART 7.44 11/12/2021 0847   PCO2ART 68 (HH) 11/12/2021 0847   PO2ART 53 (L) 11/12/2021 0847   HCO3 46.2 (H) 11/12/2021 0847   O2SAT 88.3 11/12/2021 0847     Coagulation Profile: No results for input(s): INR, PROTIME in the last 168 hours.  Cardiac Enzymes: No results for input(s): CKTOTAL, CKMB, CKMBINDEX, TROPONINI in the last 168 hours.  HbA1C: Hgb A1c MFr Bld  Date/Time Value Ref Range Status  04/27/2017 12:17 AM 4.5 (L) 4.8 - 5.6 % Final    Comment:    (NOTE)         Pre-diabetes: 5.7 - 6.4         Diabetes: >6.4         Glycemic control for adults with diabetes: <7.0     CBG: No results for input(s): GLUCAP in the last 168 hours.  Review of Systems:   Review of Systems  Constitutional: Negative.   HENT: Negative.    Eyes: Negative.   Respiratory:  Positive for cough,  sputum production, shortness of breath and wheezing. Negative for hemoptysis.   Cardiovascular:  Positive for palpitations, orthopnea and leg swelling. Negative for chest pain, claudication and PND.  Gastrointestinal: Negative.   Genitourinary: Negative.   Musculoskeletal:  Positive for back pain and neck pain.  Skin: Negative.   Neurological: Negative.   Endo/Heme/Allergies: Negative.   Psychiatric/Behavioral:  The patient is nervous/anxious.     Past Medical History  She,  has a past medical history of Arthritis, Cancer ([place]), COPD (chronic obstructive pulmonary disease) ([place]), Dyspnea, and Jaundice (04/2017).   Surgical History    Past Surgical History:  Procedure Laterality Date   BREAST BIOPSY Right 08/26/2018   Affirm Bx ("x" clip) path pending   BUNIONECTOMY     COLONOSCOPY     COLONOSCOPY WITH PROPOFOL N/A 05/26/2017   Procedure: COLONOSCOPY WITH PROPOFOL;  Surgeon: Lucilla Lame, MD;  Location: [place] [facility];  Service: Endoscopy;  Laterality: N/A;  please do not move appt time   ERCP N/A 04/25/2017   Procedure: ENDOSCOPIC RETROGRADE CHOLANGIOPANCREATOGRAPHY (ERCP);  Surgeon: Lucilla Lame, MD;  [facility] END[facility]COPY;  Service: Endoscopy;  Laterality: N/A;   ERCP N/A 09/30/2017   Procedure: ENDOSCOPIC RETROGRADE CHOLANGIOPANCREATOGRAPHY (ERCP) Pancreatic stent removal;  Surgeon: Lucilla Lame, MD;  Location: [facility] ENDOSCOPY;  Service: Endoscopy;  Laterality: N/A;   POLYPECTOMY  05/26/2017   Procedure: POLYPECTOMY;  Surgeon: Lucilla Lame, MD;  Location: [place];  Service: Endoscopy;;   TONSILLECTOMY       Social History   reports that she quit smoking about 3 weeks ago. Her smoking use included cigarettes. She has a 28.50 pack-year smoking history. She has never used smokeless tobacco. She reports current alcohol use of about 7.0 standard drinks per week. She reports that she does not use drugs.   Family History   Her family history includes Breast cancer  (age of onset: 74) in her mother.   Allergies Allergies  Allergen Reactions   Penicillins Hives    Has patient had a PCN reaction causing immediate rash, facial/tongue/throat swelling, SOB or lightheadedness with hypotension: no Has patient had a PCN reaction causing severe rash involving mucus membranes or skin necrosis: no Has patient had a PCN reaction that required hospitalization no Has patient had a PCN reaction occurring within the last 10 years: no If all of the above answers are "NO", then may proceed with Cephalosporin use.      Home Medications  Prior to Admission medications   Medication Sig Start Date End Date Taking? Authorizing Provider  albuterol (PROVENTIL) (2.5 MG/3ML) 0.083% nebulizer solution Take 3 mLs (2.5 mg total) by nebulization every 4 (four) hours as needed for wheezing or shortness of breath. 11/03/21 11/03/22  Naaman Plummer, MD  albuterol (VENTOLIN HFA) 108 (90 Base) MCG/ACT inhaler Inhale 2 puffs into the lungs every 4 (four) hours as needed for shortness of breath.  08/27/16 01/24/19  [provider]  aspirin EC 81 MG tablet Take 81 mg by mouth daily. 10/11/21   [provider]  Fluticasone-Salmeterol (ADVAIR) 250-50 MCG/DOSE AEPB Inhale 1 puff into the lungs 2 (two) times daily.    [provider]  lovastatin (MEVACOR) 40 MG tablet Take 40 mg by mouth daily with supper. 10/11/21   [provider]  OXYGEN Inhale 3 L into the lungs. 1 L when not active    [provider]  tiotropium (SPIRIVA) 18 MCG inhalation capsule Place 18 mcg into inhaler and inhale at bedtime.  08/27/16 01/24/19  [provider]  TURMERIC PO Take by mouth.    [provider]    Scheduled Meds:  amiodarone  200 mg Oral BID   apixaban  5 mg Oral BID   diltiazem  180 mg Oral Daily   fluticasone  2 spray Each Nare Daily   guaiFENesin  1,200 mg Oral BID   ipratropium-albuterol  3 mL Nebulization Q6H   mouth rinse  15 mL Mouth  Rinse BID   morphine  2.5 mg Oral Q4H   multivitamin with minerals  1 tablet Oral Daily   pravastatin  40 mg Oral q1800   predniSONE  20 mg Oral Q breakfast   senna-docusate  1 tablet Oral BID   Continuous Infusions:    PRN Meds:.albuterol, ALPRAZolam, bisacodyl, morphine injection, naproxen, sodium chloride, sodium chloride flush    Assessment &  Plan:   Acute on chronic hypercapnic hypoxic respiratory failure secondary to advanced COPD -Supplemental oxygen as needed, maintain SpO2 > 88% -Intermittent chest x-ray & ABG PRN -Ensure adequate pulmonary hygiene -reduced steroids to prednisone - no need for antibiotics           -currently on 14L hfnc  Severe constipation     - s/p enema - no stool     - KUB today   Paroxysmal atrial fibrillation Runs of SVT New since admission likely in the setting of AECOPD as above -Continue diltiazem 240 daily for rate control and as BP permits -Continue Eliquis 5mg  BID  CHA2DS2VASc  -If uncontrolled with above plan for AAD per cardio recommendations -Replace electrolytes,  Hyperglycemia likely steroid-induced -Check hemoglobin A1c -CBGs -Sliding scale insulin -Follow hyper/hypoglycemia protocol ` Best practice:  Diet:  Oral Pain/Anxiety/Delirium protocol (if indicated): No VAP protocol (if indicated): Not indicated DVT prophylaxis: Systemic AC GI prophylaxis: N/A Glucose control:  SSI Yes Central venous access:  N/A Arterial line:  N/A Foley:  N/A Mobility:  bed rest  PT consulted: Yes Last date of multidisciplinary goals of care discussion [12/4] Code Status:  DNR Disposition: Progressive Care Unit   = Goals of Care = Code Status Order: @CODE @   Primary Emergency ContactDeziree, Mokry, Home Phone: [phone] Patient wishes to pursue ongoing treatment, but concurred that if deteriorated to pulselessness, patient would prefer a natural death as opposed to invasive measures such as CPR and intubation.      Ottie Glazier, M.D.  [facility]

## 2021-11-25 NOTE — Evaluation (Signed)
Physical Therapy Evaluation Patient Details Name: Margaret Stevenson MRN: [id number] DOB: 10/25/1946 Today's Date: 11/25/2021  History of Present Illness  Pt is a 75 y.o. female with medical history significant for Nicotine dependence and COPD on home oxygen at 3 L who presents to the ED for the second time in 2 days with COPD exacerbation unrelieved with home bronchodilators.  EMS recorded O2 sats in the 80s on her home flow rate and she was placed on CPAP. PMH of COPD, previous melanoma and pulmonary nodules.  Clinical Impression  PT was re-consulted as patient down to 14L 02 and clinically improved. Patient seems interested in short term rehab with the ultimate goal of returning home alone. However Hospice discussions still ongoing.  Patient was agreeable to PT and wanted to try standing today. She required only supervision level of care for bed mobility and sit to stand transfer from bed. She required multiple prolonged rest breaks between bouts of activity and is fatigued with minimal activity. Although no physical assistance required for mobility efforts today, standing tolerance was limited to about one minute and patient unable to progress to walking this session. Sp02 decreased to 85% in standing on 14 L02 and required short rest break and pursed lip breathing to return to 88-90%. At this time, patient wishes to continue with PT efforts in attempts to possibly go to rehab. PT will continue to follow to maximize independence and decrease caregiver burden.      Recommendations for follow up therapy are one component of a multi-disciplinary discharge planning process, led by the attending physician.  Recommendations may be updated based on patient status, additional functional criteria and insurance authorization.  Follow Up Recommendations Skilled nursing-short term rehab (<3 hours/day)    Assistance Recommended at Discharge Frequent or constant Supervision/Assistance  Functional Status  Assessment Patient has had a recent decline in their functional status and demonstrates the ability to make significant improvements in function in a reasonable and predictable amount of time.  Equipment Recommendations  Other (comment) (to be determined at next level of care)    Recommendations for Other Services       Precautions / Restrictions Precautions Precautions: Fall Restrictions Weight Bearing Restrictions: No      Mobility  Bed Mobility Overal bed mobility: Needs Assistance Bed Mobility: Supine to Sit;Sit to Supine;Rolling Rolling: Supervision   Supine to sit: Supervision;HOB elevated Sit to supine: Supervision;HOB elevated   General bed mobility comments: no physical assistance required. heavy use of bed rails and HOB was elevated. increased time and effort required with prolonged rest breaks between bouts of activity due to fatigue    Transfers Overall transfer level: Needs assistance Equipment used: Rolling walker (2 wheels) Transfers: Sit to/from Stand Sit to Stand: Supervision           General transfer comment: verbal cues for hand placement. no physical assistance required for standing. patient toleranced standing for one minute before requiring seated rest break. Sp02 does decrease down to 85% with activity on 14L02 but increased to 88-90% with short seated rest break and pursed lip breathing techniques used.    Ambulation/Gait               General Gait Details: unable to ambulate due to poor standing tolerance, fatigue with minimal activity  Stairs            Wheelchair Mobility    Modified Rankin (Stroke Patients Only)       Balance Overall balance assessment: Needs assistance Sitting-balance  support: Feet supported;Bilateral upper extremity supported Sitting balance-Leahy Scale: Fair     Standing balance support: Bilateral upper extremity supported Standing balance-Leahy Scale: Fair Standing balance comment: standing  tolerance of one minute. no loss of balance with UE supported on rolling walker. stand by assistance for safety                             Pertinent Vitals/Pain Pain Assessment: No/denies pain    Home Living Family/patient expects to be discharged to:: Private residence Living Arrangements: Alone                      Prior Function Prior Level of Function : Independent/Modified Independent             Mobility Comments: worsening mobility for the last 6 months, stated at home she is walking minimally, to couch and bed and back       Hand Dominance        Extremity/Trunk Assessment                Communication   Communication: No difficulties  Cognition Arousal/Alertness: Awake/alert Behavior During Therapy: WFL for tasks assessed/performed Overall Cognitive Status: Within Functional Limits for tasks assessed                                 General Comments: patient cooperative with session. she is able to follow single step commands with extra time        General Comments General comments (skin integrity, edema, etc.): heart rate does increase to 131bpm with activity    Exercises     Assessment/Plan    PT Assessment Patient needs continued PT services  PT Problem List Decreased strength;Decreased mobility;Decreased range of motion;Decreased activity tolerance;Decreased balance;Cardiopulmonary status limiting activity       PT Treatment Interventions DME instruction;Therapeutic exercise;Gait training;Balance training;Stair training;Neuromuscular re-education;Functional mobility training;Therapeutic activities;Patient/family education    PT Goals (Current goals can be found in the Care Plan section)  Acute Rehab PT Goals Patient Stated Goal: to be able to walk from the couch to her bathroom at home, to be able to perform pericare after toileting PT Goal Formulation: With patient Time For Goal Achievement:  12/09/21 Potential to Achieve Goals: Fair    Frequency Min 2X/week   Barriers to discharge Decreased caregiver support;Inaccessible home environment      Co-evaluation               AM-PAC PT "6 Clicks" Mobility  Outcome Measure Help needed turning from your back to your side while in a flat bed without using bedrails?: A Little Help needed moving from lying on your back to sitting on the side of a flat bed without using bedrails?: A Little Help needed moving to and from a bed to a chair (including a wheelchair)?: A Lot Help needed standing up from a chair using your arms (e.g., wheelchair or bedside chair)?: A Lot Help needed to walk in hospital room?: A Lot Help needed climbing 3-5 steps with a railing? : Total 6 Click Score: 13    End of Session Equipment Utilized During Treatment: Oxygen Activity Tolerance: Patient limited by fatigue Patient left: in bed;with call bell/phone within reach;with bed alarm set;with family/visitor present (son at the bedside) Nurse Communication: Mobility status (discussed with Dr Posey Pronto also) PT Visit Diagnosis: Other abnormalities of gait and  mobility (R26.89);Muscle weakness (generalized) (M62.81)    Time: 6840-3353 PT Time Calculation (min) (ACUTE ONLY): 43 min   Charges:   PT Evaluation $PT Re-evaluation: 1 Re-eval PT Treatments $Therapeutic Activity: 23-37 mins        Minna Merritts, PT, MPT   Percell Locus 11/25/2021, 11:16 AM

## 2021-11-25 NOTE — Progress Notes (Signed)
[facility] NAME: Margaret Stevenson    MR#:  [id number]  DATE OF BIRTH:  Oct 26, 1946  SUBJECTIVE:  son Margaret Stevenson at bedside.  Patient high flow nasal cannula oxygen has been dropped down to 14 L. She is tolerating it well. She wants to continue all her current meds. Patient worked with physical therapy. She was just able to stand up for less than a minute. Oxygen sats dropped in the 80's. Very fatigued by minimal PT attempt today REVIEW OF SYSTEMS:   Review of Systems  Constitutional:  Negative for chills, fever and weight loss.  HENT:  Negative for ear discharge, ear pain and nosebleeds.   Eyes:  Negative for blurred vision, pain and discharge.  Respiratory:  Positive for shortness of breath. Negative for sputum production, wheezing and stridor.   Cardiovascular:  Negative for chest pain, palpitations, orthopnea and PND.  Gastrointestinal:  Negative for abdominal pain, diarrhea, nausea and vomiting.  Genitourinary:  Negative for frequency and urgency.  Musculoskeletal:  Negative for back pain and joint pain.  Neurological:  Positive for weakness. Negative for sensory change, speech change and focal weakness.  Psychiatric/Behavioral:  Negative for depression and hallucinations. The patient is nervous/anxious.     DRUG ALLERGIES:   Allergies  Allergen Reactions   Penicillins Hives    Has patient had a PCN reaction causing immediate rash, facial/tongue/throat swelling, SOB or lightheadedness with hypotension: no Has patient had a PCN reaction causing severe rash involving mucus membranes or skin necrosis: no Has patient had a PCN reaction that required hospitalization no Has patient had a PCN reaction occurring within the last 10 years: no If all of the above answers are "NO", then may proceed with Cephalosporin use.     VITALS:  Blood pressure (!) 128/59, pulse 84, temperature 97.9 F (36.6 C), temperature source Oral,  resp. rate 19, height 5' 6.5" (1.689 m), weight 54 kg, SpO2 90 %.  PHYSICAL EXAMINATION:   Physical Exam  GENERAL:  75 y.o.-year-old patient lying in the bed with acute distress. Thin cachectic HEENT: Head atraumatic, normocephalic.   LUNGS: distant breath sounds bilaterally, no wheezing, rales, rhonchi. CARDIOVASCULAR: S1, S2 normal. No murmurs, rubs, or gallops.  ABDOMEN: Soft, nontender, nondistended. EXTREMITIES: No cyanosis, clubbing or edema b/l.    NEUROLOGIC: nonfocal PSYCHIATRIC:  patient is alert and oriented x 3.  SKIN: No obvious rash, lesion, or ulcer.   LABORATORY PANEL:  CBC Recent Labs  Lab 11/19/21 0805  WBC 17.1*  HGB 13.9  HCT 45.1  PLT 262     Chemistries  Recent Labs  Lab 11/19/21 0805  NA 138  K 4.2  CL 93*  CO2 40*  GLUCOSE 102*  BUN 19  CREATININE <0.30*  CALCIUM 8.1*  MG 2.3    Cardiac Enzymes No results for input(s): TROPONINI in the last 168 hours. RADIOLOGY:  No results found. ASSESSMENT AND PLAN:   Margaret Stevenson is a 75 y.o. female with medical history significant for COPD, chronic hypoxemic respiratory failure, pulmonary nodules, history of melanoma, who presented to the hospital because of difficulty breathing    Acute on chronic hypoxemic respiratory failure:  --She is not getting any better.  Oxygen requirement has gone up to oxygen via HFNC, 85% 45 L/min.  She had been on 65% for some time.  Difficult to wean oxygen. 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 She has been mentioning to the staff and also to  delete that she is going to die here. -- Palliative care has been following. Discussed case with Margaret.[email or web address] and he is in agreement with hospice care if patient agrees to it. Will discuss with son. -- Chest x-ray from today does not show much improved --12/15-- I along with [facility] palliative care nurse practitioner patient's son Margaret Stevenson and patient had a long discussion regarding her end-stage severe  COPD. She understands there is not much left to do as far as treatment. Discussed option about hospice facility and hospice at home. Discussed about morphine for symptom management. Also discussed about not chasing the oxygen saturation numbers and more symptom management. Patient is agreeable with starting morphine. Will continue to monitor and eventually work with her regarding discharging to hospice facility if she prefers that way. [facility] aware --Margaret Stevenson agrees with above. --12/16-- morphine 1 mg IV Q2 PRN added. Continue oral morphine. Answered questions about oxygen here and hospice facility in case patient decides that route. Son at bedside. Patient is not want physical therapy/occupational therapy. Orders discontinued  --12/17-- On 14L HFNC oxygen. Less anxious. Tolerating po morphine -12/18-- remains on 4 L high flow nasal cannula oxygen. Son at bedside. Several questions answered. Patient work some with PT. Very fatigued. The sats very easily. Questions about hospice answered.  End-stage severe COPD exacerbation: Chest x-ray on 11/16/2021 showed increased interstitial markings. Continue steroids and bronchodilators. No additional antibiotics needed per pulmonologist..  Completed 5 days of azithromycin.  Paroxysmal atrial fibrillation with RVR: She still in A. fib with fluctuating heart rate.  Continue diltiazem, amiodarone and Eliquis.     Hypokalemia: Improved  Pulmonary nodules: PET scan in April 2022 suggestive of benign etiology.    Tobacco use disorder: Counseled to quit smoking. Nutrition Status: Nutrition Problem: Moderate Malnutrition Etiology: chronic illness (COPD) Signs/Symptoms: mild fat depletion, moderate fat depletion, mild muscle depletion, moderate muscle depletion Interventions: MVI, Magic cup  overall poor prognosis.  Family communication :Margaret Stevenson son at bedside Consults : pulmonary, palliative CODE STATUS: DNR DVT Prophylaxis : Level of care:  Progressive Status is: Inpatient  Remains inpatient appropriate because: poor respiratory status still requiring significant amount of oxygen.    TOTAL TIME TAKING CARE OF THIS PATIENT: 25 minutes.  >50% time spent on counselling and coordination of care  Note: This dictation was prepared with Dragon dictation along with smaller phrase technology. Any transcriptional errors that result from this process are unintentional.  Fritzi Mandes M.D    [facility]   CC: Primary care physician; Dion Body, MD Patient ID: Dorthy Magnussen, female   DOB: 09-16-1946, 75 y.o.   MRN: [id number]

## 2021-11-25 NOTE — TOC Progression Note (Signed)
Transition of Care [facility]) - Progression Note    Patient Details  Name: Margaret Stevenson MRN: [id number] Date of Birth: [birth year]  [facility] CM/SW Contact  Izola Price, RN Phone Number: 11/25/2021, 5:15 PM  Clinical Narrative: Patient is having difficult time deciding on hospice care. [facility] residential consult/referral made but  now being monitored for decision  to keep patient's place in line for a bed if decision is made. Provider to follow up tomorrow on which way patient/family wishes to go. Santiago Glad at [employer] and contacted RN CM. Santiago Glad and TOC will follow situation. Simmie Davies RN CM       Expected Discharge Plan: Skilled Nursing Facility Barriers to Discharge: Continued Medical Work up  Expected Discharge Plan and Services Expected Discharge Plan: [facility] arrangements for the past 2 months: Single Family Home                                       Social Determinants of Health (SDOH) Interventions    Readmission Risk Interventions No flowsheet data found.

## 2021-11-26 DIAGNOSIS — Z66 Do not resuscitate: Secondary | ICD-10-CM

## 2021-11-26 NOTE — Progress Notes (Signed)
Patient is a 75 year old female. She was alert and oriented this morning. Medications administered as prescribed. She takes po Morphine Q4 given at 8:30 am and 12:30 pm, and will take the next dose at 4:30 pm.   Patient had shortness of breath around 2:15 pm. Her O2 sat at 13L HFNC was 84. O2 was increased to 14L HFNC. IV Morphine PRN administered at 2:30 pm. O2 sats remained in the mid 80s.  Patient has a productive cough with clear sputum. She has a lot of congestion. Incentive spirometer recorded this afternoon at 2:45 pm was 1000. Lungs have diminished breath sounds bilaterally. Patient received breathing treatment in the morning and again at noon.  Patient has not had a bowel movement today and no bowel movement noted in documentation since 12/15. She complains of constipation.She has slight difficulty feeding herself.   Patient and son had a discussion regarding Hospice and have currently decided to take that route.   Patient is alert and oriented. Other than O2 saturation, her vitals are stable. NSR on telly.

## 2021-11-26 NOTE — Progress Notes (Signed)
Palliative Care Progress Note, Assessment & Plan   Patient Name: Margaret Stevenson       Date: 11/26/2021 DOB: 08/28/1946  Age: 75 y.o. MRN#: [id number] Attending Physician: Fritzi Mandes, MD Primary Care Physician: Dion Body, MD Admit Date: 11/05/2021  Reason for Consultation/Follow-up: Establishing goals of care  Subjective: Patient is sitting in bed with eyes closed in no apparent distress.  Nasal cannula in place at 15 L.  Breakfast tray is at bedside and patient is attempting to eat it with a fork.  Son is at bedside.  HPI: Patient is a 75 year old female with a past medical history significant for anxiety, paroxysmal A. fib, previous melanoma with pulmonary nodules, and end-stage severe COPD that presented to the emergency department on 11/05/2021 with worsening respiratory symptoms.  During this hospitalization patient has been weaned to 15 L of nasal cannula.  She also utilizes Xanax as needed for anxiety and has scheduled morphine for difficulty breathing.   Palliative medicine was consulted to discuss goals of care.  My colleague Quinn Axe met with family last week to discuss [place].   Plan of Care: I have reviewed medical records including EPIC notes, labs and imaging, assessed the patient and then met with patient and patient's son Barnabas Lister at bedside to discuss diagnosis prognosis, GOC, EOL wishes, disposition, and options.  I attempted to elicit values and goals of care important to the patient.  Shared she is a realist.  She says that she would like to be able to get up walk across the room go to the bathroom and wipe her own butt and return to bed without being tired.  She asked if I thought this was a feasible goal. I shared she will likely not be able to return to this level of functional  ability.  I reviewed the physical therapy has attempted to increase her strength and independence.  We discussed that Dr. Loni Muse also shared that he has no further aggressive therapies for her COPD and emphysema.  Despite these efforts, patient still requires high levels of oxygenation and is only able to tolerate standing for short amounts of time before becoming completely worn out.  The difference between aggressive medical intervention and comfort care was considered in light of the patient's goals of care.   Skilled nursing facility for rehabilitation as well as hospice in the home,/hospice inpatient unit reviewed in detail with patient and patient's son.  Both patient and patient's son agreed they would like to speak with hospice regarding patient's eligibility for the inpatient unit.  Attending Dr. Posey Pronto and hospice liaison made aware of patient's wishes.  Discussed with patient/family the importance of continued conversation with family and the medical providers regarding overall plan of care and treatment options, ensuring decisions are within the context of the patients values and GOCs.    Questions and concerns were addressed. The family was encouraged to call with questions or concerns.   Care plan was discussed with Dr. Posey Pronto, [facility] SW, Hospice Liaison  Code Status: DNR  Prognosis:  < 6 months  Discharge Planning: To Be Determined  Recommendations/Plan: Awaiting eligibility for hospice inpatient unit  Physical Exam Vitals and nursing note reviewed.  Constitutional:  Appearance: Normal appearance.  HENT:     Head: Normocephalic and atraumatic.     Mouth/Throat:     Mouth: Mucous membranes are moist.  Cardiovascular:     Rate and Rhythm: Normal rate.     Pulses: Normal pulses.  Pulmonary:     Effort: Pulmonary effort is normal.     Comments: 13L [place] Abdominal:     Palpations: Abdomen is soft.  Musculoskeletal:     Comments: Generalized weakness  Skin:    General:  Skin is dry.  Neurological:     Mental Status: She is alert. Mental status is at baseline.  Psychiatric:        Mood and Affect: Mood normal.        Behavior: Behavior normal.        Thought Content: Thought content normal.        Judgment: Judgment normal.                Palliative Assessment/Data: 30%    Flowsheet Rows    Flowsheet Row Most Recent Value  Intake Tab   Referral Department Hospitalist  Unit at Time of Referral Intermediate Care Unit  Palliative Care Primary Diagnosis Pulmonary  Date Notified 11/12/21  Palliative Care Type New Palliative care  Reason for referral Clarify Goals of Care  Date of Admission 11/05/21  Date first seen by Palliative Care 11/13/21  # of days Palliative referral response time 1 Day(s)  # of days IP prior to Palliative referral 7  Clinical Assessment   Palliative Performance Scale Score 30%  Pain Max last 24 hours Not able to report  Pain Min Last 24 hours Not able to report  Dyspnea Max Last 24 Hours Not able to report  Dyspnea Min Last 24 hours Not able to report  Psychosocial & Spiritual Assessment   Palliative Care Outcomes         Total Time 25 minutes  Greater than 50%  of this time was spent counseling and coordinating care related to the above assessment and plan.  Thank you for allowing the Palliative Medicine Team to assist in the care of this patient.  Keys Ilsa Iha, FNP-BC Palliative Medicine Team Team Phone # [phone]

## 2021-11-26 NOTE — Consult Note (Signed)
NAME:  Margaret Stevenson, MRN:  [id number], DOB:  [birth year], LOS: 21 ADMISSION DATE:  11/05/2021, CONSULTATION DATE: 11/11/2021 REFERRING MD: Irine Seal, MD, CHIEF COMPLAINT: Persistent hypoxia  Brief Patient Report  75 y.o. female w/ a h/o COPD and tob abuse, who was admitted 11/28 w/ resp failure and COPD w/ finding of PAF. HPI  75 year old female with past medical history of COPD, previous melanoma and pulmonary nodules who was admitted on 11/05/2021 with difficulty breathing.  CT scan of the chest on 11/03/2021 after an ER visit that was negative for pulmonary embolism and showed stable pulmonary nodules. She presented back to the ED with worsening respiratory symptoms.  11/26/21- patient is going to hospice. PCCM will sign off.  I appreciate the opportunity to care for this unfortunate patient.   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   Elevated CA 19-9 level 05/08/2017   Arthritis 05/07/2017   Asthma without status asthmaticus 05/07/2017   Borderline diabetes 05/07/2017   Hx of adenomatous colonic polyps 05/07/2017   Tobacco dependence 05/07/2017   Sepsis ([place]) 04/27/2017   Disease of biliary tract     Obstruction of bile duct     Jaundice     Hypoxia 10/11/2016   Anxiety 10/11/2016   COPD (chronic obstructive pulmonary disease) ([place]) 10/06/2016   CAP (community acquired pneumonia) 10/06/2016   Hyperglycemia 10/06/2016   Vaccine counseling 06/21/2016   History of malignant melanoma of skin 08/03/2013    Significant Hospital Events   11/28: Admitted to progressive care unit with  acute on chronic respiratory failure secondary to COPD exacerbation/anxiety 11/30: Continue to wean oxygen to 5 L/min. Cardiology consulted for Afib with RVR 12/1: Patient with increased oxygen requirement 8 to 9 L HFNC failed weaning. 12/2: Remains on high flow nasal cannula at 8 to 9 L to keep sats greater than 90% 12/3: Still requiring high oxygen also in A. fib with RVR 12/4:  PCCM consulted Consults:  Cardiology PCCM  Procedures:  None  Significant Diagnostic Tests:  11/28: Chest Xray> no acute cardiopulmonary process, emphysema 12/4: 7 mm right upper lobe pulmonary nodule, better demonstrated on recent chest CT. 2. Peribronchial opacities with lower lobe predominance may represent bronchitic changes or interstitial pulmonary edema. 3. Emphysema  Micro Data:  11/28: SARS-CoV-2 PCR> negative 11/28: Influenza PCR> negative  Antimicrobials:  None  OBJECTIVE  Blood pressure (!) 145/59, pulse 80, temperature 99.1 F (37.3 C), temperature source Axillary, resp. rate 18, height 5' 6.5" (1.689 m), weight 54 kg, SpO2 90 %.        Intake/Output Summary (Last 24 hours) at 11/26/2021 1701 Last data filed at 11/25/2021 1909 Gross per 24 hour  Intake 120 ml  Output --  Net 120 ml    Filed Weights   11/17/21 1200  Weight: 54 kg    Physical Examination  GENERAL: 75 year-old patient lying in the bed with no acute distress.  EYES: Pupils equal, round, reactive to light and accommodation. No scleral icterus. Extraocular muscles intact.  HEENT: Head atraumatic, normocephalic. Oropharynx  and nasopharynx clear.  NECK:  Supple, no jugular venous distention. No thyroid enlargement, no tenderness.  LUNGS: Decreased breath sounds bilaterally, no wheezing, rales,rhonchi or crepitation. No use of accessory muscles of respiration.  CARDIOVASCULAR: S1, S2 normal. No murmurs, rubs, or gallops.  ABDOMEN: Soft, nontender, nondistended. Bowel sounds present. No organomegaly or mass.   EXTREMITIES: No pedal edema, cyanosis, or clubbing.  NEUROLOGIC: Cranial nerves II through XII are intact.  Muscle strength 5/5 in all extremities. Sensation intact. Gait not checked.  PSYCHIATRIC: The patient is alert and oriented x 3.  SKIN: No obvious rash, lesion, or ulcer.   Labs/imaging that I havepersonally reviewed  (right click and "Reselect all SmartList Selections" daily)     Labs   CBC: No results for input(s): WBC, NEUTROABS, HGB, HCT, MCV, PLT in the last 168 hours.   Basic Metabolic Panel: No results for input(s): NA, K, CL, CO2, GLUCOSE, BUN, CREATININE, CALCIUM, MG, PHOS in the last 168 hours.  GFR: CrCl cannot be calculated (This lab value cannot be used to calculate CrCl because it is not a number: <0.30). No results for input(s): PROCALCITON, WBC, LATICACIDVEN in the last 168 hours.   Liver Function Tests: No results for input(s): AST, ALT, ALKPHOS, BILITOT, PROT, ALBUMIN in the last 168 hours. No results for input(s): LIPASE, AMYLASE in the last 168 hours. No results for input(s): AMMONIA in the last 168 hours.  ABG    Component Value Date/Time   PHART 7.44 11/12/2021 0847   PCO2ART 68 (HH) 11/12/2021 0847   PO2ART 53 (L) 11/12/2021 0847   HCO3 46.2 (H) 11/12/2021 0847   O2SAT 88.3 11/12/2021 0847     Coagulation Profile: No results for input(s): INR, PROTIME in the last 168 hours.  Cardiac Enzymes: No results for input(s): CKTOTAL, CKMB, CKMBINDEX, TROPONINI in the last 168 hours.  HbA1C: Hgb A1c MFr Bld  Date/Time Value Ref Range Status  04/27/2017 12:17 AM 4.5 (L) 4.8 - 5.6 % Final    Comment:    (NOTE)         Pre-diabetes: 5.7 - 6.4         Diabetes: >6.4         Glycemic control for adults with diabetes: <7.0     CBG: No results for input(s): GLUCAP in the last 168 hours.  Review of Systems:   Review of Systems  Constitutional: Negative.   HENT: Negative.    Eyes: Negative.   Respiratory:  Positive for cough, sputum  production, shortness of breath and wheezing. Negative for hemoptysis.   Cardiovascular:  Positive for palpitations, orthopnea and leg swelling. Negative for chest pain, claudication and PND.  Gastrointestinal: Negative.   Genitourinary: Negative.   Musculoskeletal:  Positive for back pain and neck pain.  Skin: Negative.   Neurological: Negative.  Sensory change: 9.  Endo/Heme/Allergies: Negative.   Psychiatric/Behavioral:  The patient is nervous/anxious.     Past Medical History  She,  has a past medical history of Arthritis, Cancer ([place]), COPD (chronic obstructive pulmonary disease) ([place]), Dyspnea, and Jaundice (04/2017).   Surgical History    Past Surgical History:  Procedure Laterality Date   BREAST BIOPSY Right 08/26/2018   Affirm Bx ("x" clip) path pending   BUNIONECTOMY     COLONOSCOPY     COLONOSCOPY WITH PROPOFOL N/A 05/26/2017   Procedure: COLONOSCOPY WITH PROPOFOL;  Surgeon: Lucilla Lame, MD;  Location: [facility];  Service: Endoscopy;  Laterality: N/A;  please do not move appt time  ERCP N/A 04/25/2017   Procedure: ENDOSCOPIC RETROGRADE CHOLANGIOPANCREATOGRAPHY (ERCP);  Surgeon: Lucilla Lame, MD;  Location: [facility] ENDOSCOPY;  Service: Endoscopy;  Laterality: N/A;   ERCP N/A 09/30/2017   Procedure: ENDOSCOPIC RETROGRADE CHOLANGIOPANCREATOGRAPHY (ERCP) Pancreatic stent removal;  Surgeon: Lucilla Lame, MD;  Location: [facility] ENDOSCOPY;  Service: Endoscopy;  Laterality: N/A;   POLYPECTOMY  05/26/2017   Procedure: POLYPECTOMY;  Surgeon: Lucilla Lame, MD;  Location: [facility];  Service: Endoscopy;;   TONSILLECTOMY       Social History   reports that she quit smoking about 3 weeks ago. Her smoking use included cigarettes. She has a 28.50 pack-year smoking history. She has never used smokeless tobacco. She reports current alcohol use of about 7.0 standard drinks per week. She reports that she does not use drugs.   Family History   Her family history includes  Breast cancer (age of onset: 22) in her mother.   Allergies Allergies  Allergen Reactions   Penicillins Hives    Has patient had a PCN reaction causing immediate rash, facial/tongue/throat swelling, SOB or lightheadedness with hypotension: no Has patient had a PCN reaction causing severe rash involving mucus membranes or skin necrosis: no Has patient had a PCN reaction that required hospitalization no Has patient had a PCN reaction occurring within the last 10 years: no If all of the above answers are "NO", then may proceed with Cephalosporin use.      Home Medications  Prior to Admission medications   Medication Sig Start Date End Date Taking? Authorizing Provider  albuterol (PROVENTIL) (2.5 MG/3ML) 0.083% nebulizer solution Take 3 mLs (2.5 mg total) by nebulization every 4 (four) hours as needed for wheezing or shortness of breath. 11/03/21 11/03/22  Naaman Plummer, MD  albuterol (VENTOLIN HFA) 108 (90 Base) MCG/ACT inhaler Inhale 2 puffs into the lungs every 4 (four) hours as needed for shortness of breath.  08/27/16 01/24/19  [provider]  aspirin EC 81 MG tablet Take 81 mg by mouth daily. 10/11/21   [provider]  Fluticasone-Salmeterol (ADVAIR) 250-50 MCG/DOSE AEPB Inhale 1 puff into the lungs 2 (two) times daily.    [provider]  lovastatin (MEVACOR) 40 MG tablet Take 40 mg by mouth daily with supper. 10/11/21   [provider]  OXYGEN Inhale 3 L into the lungs. 1 L when not active    [provider]  tiotropium (SPIRIVA) 18 MCG inhalation capsule Place 18 mcg into inhaler and inhale at bedtime.  08/27/16 01/24/19  [provider]  TURMERIC PO Take by mouth.    [provider]    Scheduled Meds:  amiodarone  400 mg Oral BID   apixaban  5 mg Oral BID   diltiazem  180 mg Oral Daily   fluticasone  2 spray Each Nare Daily   guaiFENesin  1,200 mg Oral BID   ipratropium-albuterol  3 mL Nebulization Q6H   mouth rinse   15 mL Mouth Rinse BID   morphine  2.5 mg Oral Q4H   multivitamin with minerals  1 tablet Oral Daily   pravastatin  40 mg Oral q1800   predniSONE  20 mg Oral Q breakfast   senna-docusate  1 tablet Oral BID   Continuous Infusions:    PRN Meds:.albuterol, ALPRAZolam, bisacodyl, morphine injection, naproxen, sodium chloride, sodium chloride flush    Assessment & Plan:   Acute on chronic hypercapnic hypoxic respiratory failure secondary to advanced COPD -Supplemental oxygen as needed, maintain SpO2 > 88% -Intermittent chest  x-ray & ABG PRN -Ensure adequate pulmonary hygiene -reduced steroids to prednisone - no need for antibiotics           -currently on 14L hfnc  Severe constipation     - s/p enema - no stool     - KUB today   Paroxysmal atrial fibrillation Runs of SVT New since admission likely in the setting of AECOPD as above -Continue diltiazem 240 daily for rate control and as BP permits -Continue Eliquis 5mg  BID  CHA2DS2VASc  -If uncontrolled with above plan for AAD per cardio recommendations -Replace electrolytes,  Hyperglycemia likely steroid-induced -Check hemoglobin A1c -CBGs -Sliding scale insulin -Follow hyper/hypoglycemia protocol ` Best practice:  Diet:  Oral Pain/Anxiety/Delirium protocol (if indicated): No VAP protocol (if indicated): Not indicated DVT prophylaxis: Systemic AC GI prophylaxis: N/A Glucose control:  SSI Yes Central venous access:  N/A Arterial line:  N/A Foley:  N/A Mobility:  bed rest  PT consulted: Yes Last date of multidisciplinary goals of care discussion [12/4] Code Status:  DNR Disposition: Progressive Care Unit   = Goals of Care = Code Status Order: @CODE @   Primary Emergency ContactDoaa, [place], Home Phone: [phone] Patient wishes to pursue ongoing treatment, but concurred that if deteriorated to pulselessness, patient would prefer a natural death as opposed to invasive measures such as CPR and intubation.       Ottie Glazier, M.D.  Pulmonary & [place]

## 2021-11-26 NOTE — Progress Notes (Signed)
[facility] NAME: Margaret Stevenson    MR#:  [id number]  DATE OF BIRTH:  09-Aug-1946  SUBJECTIVE:  son Margaret Stevenson at bedside.  Patient high flow nasal cannula oxygen has been dropped down to 14 L. Patient is not having best-of-the day. She and son would like hospice to come and discuss with them REVIEW OF SYSTEMS:   Review of Systems  Constitutional:  Negative for chills, fever and weight loss.  HENT:  Negative for ear discharge, ear pain and nosebleeds.   Eyes:  Negative for blurred vision, pain and discharge.  Respiratory:  Positive for shortness of breath. Negative for sputum production, wheezing and stridor.   Cardiovascular:  Negative for chest pain, palpitations, orthopnea and PND.  Gastrointestinal:  Negative for abdominal pain, diarrhea, nausea and vomiting.  Genitourinary:  Negative for frequency and urgency.  Musculoskeletal:  Negative for back pain and joint pain.  Neurological:  Positive for weakness. Negative for sensory change, speech change and focal weakness.  Psychiatric/Behavioral:  Negative for depression and hallucinations. The patient is nervous/anxious.     DRUG ALLERGIES:   Allergies  Allergen Reactions   Penicillins Hives    Has patient had a PCN reaction causing immediate rash, facial/tongue/throat swelling, SOB or lightheadedness with hypotension: no Has patient had a PCN reaction causing severe rash involving mucus membranes or skin necrosis: no Has patient had a PCN reaction that required hospitalization no Has patient had a PCN reaction occurring within the last 10 years: no If all of the above answers are "NO", then may proceed with Cephalosporin use.     VITALS:  Blood pressure (!) 145/59, pulse 80, temperature 99.1 F (37.3 C), temperature source Axillary, resp. rate 18, height 5' 6.5" (1.689 m), weight 54 kg, SpO2 90 %.  PHYSICAL EXAMINATION:   Physical Exam  GENERAL:  75 y.o.-year-old  patient lying in the bed with chronic respiratory distress. Thin cachectic HEENT: Head atraumatic, normocephalic.   LUNGS: distant breath sounds bilaterally, no wheezing, rales, rhonchi. CARDIOVASCULAR: S1, S2 normal. No murmurs, rubs, or gallops.  ABDOMEN: Soft, nontender, nondistended. EXTREMITIES: No cyanosis,  ++clubbing no edema b/l.    NEUROLOGIC: nonfocal PSYCHIATRIC:  patient is alert and oriented x 3.  SKIN: No obvious rash, lesion, or ulcer.   LABORATORY PANEL:  CBC No results for input(s): WBC, HGB, HCT, PLT in the last 168 hours.   Chemistries  No results for input(s): NA, K, CL, CO2, GLUCOSE, BUN, CREATININE, CALCIUM, MG, AST, ALT, ALKPHOS, BILITOT in the last 168 hours.  Invalid input(s): GFRCGP  Cardiac Enzymes No results for input(s): TROPONINI in the last 168 hours. RADIOLOGY:  No results found. ASSESSMENT AND PLAN:   Margaret Stevenson is a 75 y.o. female with medical history significant for COPD, chronic hypoxemic respiratory failure, pulmonary nodules, history of melanoma, who presented to the hospital because of difficulty breathing  Acute on chronic hypoxemic respiratory failure:  --She is not getting any better.  Oxygen requirement has gone up to oxygen via HFNC, 85% 45 L/min.  She had been on 65% for some time.  Difficult to wean oxygen. 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 She has been mentioning to the staff and also to delete that she is going to die here. -- Palliative care has been following. Discussed case with Dr.aleskerov and he is in agreement with hospice care if patient agrees to it. Will discuss with son. -- Chest x-ray from today does  not show much improved --12/15-- I along with [facility] palliative care nurse practitioner patient's son Margaret Stevenson and patient had a long discussion regarding her end-stage severe COPD. She understands there is not much left to do as far as treatment. Discussed option about hospice facility and hospice  at home. Discussed about morphine for symptom management. Also discussed about not chasing the oxygen saturation numbers and more symptom management. Patient is agreeable with starting morphine. Will continue to monitor and eventually work with her regarding discharging to hospice facility if she prefers that way. Authorial care hospice aware --Dr Lanney Gins agrees with above. --12/16-- morphine 1 mg IV Q2 PRN added. Continue oral morphine. Answered questions about oxygen here and hospice facility in case patient decides that route. Son at bedside. Patient is not want physical therapy/occupational therapy. Orders discontinued  --12/17-- On 14L HFNC oxygen. Less anxious. Tolerating po morphine -12/18-- remains on 4 L high flow nasal cannula oxygen. Son at bedside. Several questions answered. Patient work some with PT. Very fatigued. The sats very easily. Questions about hospice answered. 12/19-- patient and son would like to discuss with hospice.  End-stage severe COPD exacerbation: Chest x-ray on 11/16/2021 showed increased interstitial markings. Continue steroids and bronchodilators. No additional antibiotics needed per pulmonologist..  Completed 5 days of azithromycin.  Paroxysmal atrial fibrillation with RVR: She still in A. fib with fluctuating heart rate.  Continue diltiazem, amiodarone and Eliquis.     Hypokalemia: Improved  Pulmonary nodules: PET scan in April 2022 suggestive of benign etiology.    Tobacco use disorder: Counseled to quit smoking. Nutrition Status: Nutrition Problem: Moderate Malnutrition Etiology: chronic illness (COPD) Signs/Symptoms: mild fat depletion, moderate fat depletion, mild muscle depletion, moderate muscle depletion Interventions: MVI, Magic cup  overall poor prognosis.  Family communication :Barnabas Lister son at bedside Consults : pulmonary, palliative CODE STATUS: DNR DVT Prophylaxis : Level of care: Progressive Status is: Inpatient  Remains inpatient  appropriate because: poor respiratory status still requiring significant amount of oxygen.    TOTAL TIME TAKING CARE OF THIS PATIENT: 20 minutes.  >50% time spent on counselling and coordination of care  Note: This dictation was prepared with Dragon dictation along with smaller phrase technology. Any transcriptional errors that result from this process are unintentional.  Fritzi Mandes M.D    [facility]   CC: Primary care physician; Dion Body, MD Patient ID: Margaret Stevenson, female   DOB: Nov 03, 1946, 75 y.o.   MRN: [id number]

## 2021-11-26 NOTE — Progress Notes (Signed)
[facility]) Hospital Liaison Note  Received request from Transitions of Care Manager Pricilla Riffle, LCSW for family interest in [facility]. Visited patient at bedside and spoke with son Barnabas Lister to confirm interest and explain services.  Approval for Hospice Home is determined by [facility] MD. Once [facility] MD has determined Hospice Home eligibility, [place] will update hospital staff and family.  Please do not hesitate to call with any hospice related questions.    Thank you for the opportunity to participate in this patient's care.   Bobbie "Loren Racer, RN, BSN [facility] Liaison [phone]

## 2021-11-26 NOTE — Progress Notes (Signed)
Patient's SpO2 at 5:30 pm was 95%. This was up from 85% earlier in the afternoon after lunch. Patient is calm and asleep.No shortness of breath observed.

## 2021-11-26 NOTE — Progress Notes (Signed)
OT Cancellation Note  Patient Details Name: Margaret Stevenson MRN: [id number] DOB: 15-Aug-1946   Cancelled Treatment:    Reason Eval/Treat Not Completed: Patient not medically ready. Order received. Upon initial attempt, pt speaking with palliative team at bedside and thus unavailable for OT evaluation. Upon second attempt, pt with SpO2 82-86% while resting in bed and pt reporting "what good is therapy if I am going to hospice?". OT to hold at this time and re-attempt once POC has been established. RN & MD aware.   Fredirick Maudlin, OTR/L [place]

## 2021-11-26 NOTE — Progress Notes (Signed)
[facility]) Hospital Liaison Note   Patient chart and information reviewed by [facility] physician. Hospice Home eligibility confirmed.    Unfortunately, Hospice Home is not able to offer a room today. Family and Pricilla Riffle, LCSW [facility] Manager aware hospital liaison will follow up tomorrow or sooner if a room becomes available.    Please do not hesitate to call with any hospice related questions.    Thank you for the opportunity to participate in this patient's care.   Bobbie "Loren Racer, RN, BSN [facility] Liaison [phone]

## 2021-11-26 NOTE — Progress Notes (Signed)
PT Cancellation Note  Patient Details Name: Margaret Stevenson MRN: [id number] DOB: 11-14-1946   Cancelled Treatment:     Pt deferred session this am due to not feeling well.  OT attempted to see pt but low O2 sats at rest.  Pt considering Hospice services.  Will hold session today after secure chat discussion with OT and MD.  Will continue as appropriate based on decisions regarding plan of care.   Chesley Noon 11/26/2021, 2:35 PM

## 2021-11-27 MED ORDER — LORAZEPAM 2 MG/ML IJ SOLN
1.0000 mg | Freq: Four times a day (QID) | INTRAMUSCULAR | 0 refills | Status: AC | PRN
Start: 2021-11-27 — End: ?

## 2021-11-27 MED ORDER — LORAZEPAM 2 MG/ML IJ SOLN
1.0000 mg | Freq: Four times a day (QID) | INTRAMUSCULAR | Status: DC | PRN
  Administered 2021-11-27: 12:00:00 1 mg via INTRAVENOUS
  Filled 2021-11-27: qty 1

## 2021-11-27 MED ORDER — MORPHINE SULFATE (PF) 2 MG/ML IV SOLN: 1.0000 mg | INTRAVENOUS | 0 refills | Status: AC | PRN

## 2021-11-27 MED ORDER — MORPHINE 100MG IN NS 100ML (1MG/ML) PREMIX INFUSION
1.0000 mg/h | INTRAVENOUS | Status: DC
  Administered 2021-11-27: 12:00:00 1 mg/h via INTRAVENOUS
  Filled 2021-11-27: qty 100

## 2021-11-27 NOTE — Progress Notes (Signed)
Physical Therapy Discharge Patient Details Name: Margaret Stevenson MRN: [id number] DOB: 01/06/1946 Today's Date: 11/27/2021 Time:  -     Patient discharged from PT services secondary to MD orders due to transition to comfort care.  Please see latest therapy progress note for current level of functioning and progress toward goals.      GP     Chesley Noon 11/27/2021, 9:09 AM

## 2021-11-27 NOTE — Progress Notes (Signed)
Nutrition Brief Note  Chart reviewed. Pt now transitioning to comfort care.  No further nutrition interventions planned at this time.  Please re-consult as needed.   Novia Lansberry W, RD, LDN, CDCES Registered Dietitian II Certified Diabetes Care and Education Specialist Please refer to AMION for RD and/or RD on-call/weekend/after hours pager   

## 2021-11-27 NOTE — Progress Notes (Signed)
Report called to Hospice by this RN to RN Sharlyne Pacas who will be receiving patient this afternoon. Per Santiago Glad, IV is to remain in place at time of transfer from [facility] to [facility].

## 2021-11-27 NOTE — Progress Notes (Signed)
°   11/27/21 1440  Clinical Encounter Type  Visited With Patient and family together  Visit Type Initial;Social support (TOC)  Referral From Nurse  Consult/Referral To Chaplain  Spiritual Encounters  Spiritual Needs Other (Comment) (social)   Daryel November introduced herself to Pt's son, Margaret Stevenson. Chaplain inquired as to his well-being and offered support as family prepares change to hospice care. Chaplain offered that support need not be religious but simply social and emotional support. Son indicates good sources of support from spouse and family and also through exercise. Chaplain Burris encouraged his continued good self-care and to make the most of time with his mother, offered ways to be present (through playing music, for example).

## 2021-11-27 NOTE — Discharge Summary (Signed)
Margaret Stevenson at [place] NAME: Margaret Stevenson    MR#:  [id number]  [place] OF BIRTH:  [birth year]  DATE OF ADMISSION:  11/05/2021 ADMITTING PHYSICIAN: Athena Masse, MD  DATE OF DISCHARGE: 11/27/2021  PRIMARY CARE PHYSICIAN: Dion Body, MD    ADMISSION DIAGNOSIS:  SOB (shortness of breath) [R06.02] COPD exacerbation (HCC) [J44.1] Acute respiratory failure with hypoxia ([place]) [J96.01] COPD with acute exacerbation ([place]) [J44.1]  DISCHARGE DIAGNOSIS:  acute on chronic hypoxic respiratory failure secondary to end-stage COPD   SECONDARY DIAGNOSIS:   Past Medical History:  Diagnosis Date   Arthritis    hands   Cancer ([place])    melanoma 07/2013   COPD (chronic obstructive pulmonary disease) ([place])    Dyspnea    Jaundice 04/2017    HOSPITAL COURSE:  Margaret Stevenson is a 75 y.o. female with medical history significant for COPD, chronic hypoxemic respiratory failure, pulmonary nodules, history of melanoma, who presented to the hospital because of difficulty breathing   acute on chronic hypoxic respiratory failure secondary to COPD exacerbation end-stage. Moderate protein calorie malnutrition history of paroxysmal a fib -- Patient came in was placed on high flow nasal cannula oxygen. She finished a course of antibiotic was on steroids which will bring down to chronic steroids. Remains on bronchodilators. -- She was followed by pulmonary. Patient continues to decline with failure to thrive poor respiratory status and deconditioning -- palliative care was involved in patient's hospital stay. Long discussions were made with patient and son.  -- Patient remained wary decompensated despite oxygen and discussions were made about quality of life and overall poor prognosis. Question about hospice was made. They did except hospice bed and patient is being transferred to hospitals. She was placed on comfort care orders.  Patient is DNR  DNI.  Discharged to hospice facility.  CONSULTS OBTAINED:  Treatment Team:  Erby Pian, MD Ottie Glazier, MD  DRUG ALLERGIES:   Allergies  Allergen Reactions   Penicillins Hives    Has patient had a PCN reaction causing immediate rash, facial/tongue/throat swelling, SOB or lightheadedness with hypotension: no Has patient had a PCN reaction causing severe rash involving mucus membranes or skin necrosis: no Has patient had a PCN reaction that required hospitalization no Has patient had a PCN reaction occurring within the last 10 years: no If all of the above answers are "NO", then may proceed with Cephalosporin use.     DISCHARGE MEDICATIONS:   Allergies as of 11/27/2021       Reactions   Penicillins Hives   Has patient had a PCN reaction causing immediate rash, facial/tongue/throat swelling, SOB or lightheadedness with hypotension: no Has patient had a PCN reaction causing severe rash involving mucus membranes or skin necrosis: no Has patient had a PCN reaction that required hospitalization no Has patient had a PCN reaction occurring within the last 10 years: no If all of the above answers are "NO", then may proceed with Cephalosporin use.        Medication List     STOP taking these medications    albuterol (2.5 MG/3ML) 0.083% nebulizer solution Commonly known as: PROVENTIL   aspirin EC 81 MG tablet   doxycycline 50 MG capsule Commonly known as: VIBRAMYCIN   Fluticasone-Salmeterol 250-50 MCG/DOSE Aepb Commonly known as: ADVAIR   lovastatin 40 MG tablet Commonly known as: MEVACOR   OXYGEN   predniSONE 50 MG tablet Commonly known as: DELTASONE   TURMERIC PO  Ventolin HFA 108 (90 Base) MCG/ACT inhaler Generic drug: albuterol       TAKE these medications    LORazepam 2 MG/ML injection Commonly known as: ATIVAN Inject 0.5 mLs (1 mg total) into the vein every 6 (six) hours as needed for anxiety.   morphine 2 MG/ML injection Inject 0.5  mLs (1 mg total) into the vein every 2 (two) hours as needed (shortness of breath).   tiotropium 18 MCG inhalation capsule Commonly known as: SPIRIVA Place 18 mcg into inhaler and inhale at bedtime.        If you experience worsening of your admission symptoms, develop shortness of breath, life threatening emergency, suicidal or homicidal thoughts you must seek medical attention immediately by calling 911 or calling your MD immediately  if symptoms less severe.  You Must read complete instructions/literature along with all the possible adverse reactions/side effects for all the Medicines you take and that have been prescribed to you. Take any new Medicines after you have completely understood and accept all the possible adverse reactions/side effects.   Please note  You were cared for by a hospitalist during your hospital stay. If you have any questions about your discharge medications or the care you received while you were in the hospital after you are discharged, you can call the unit and asked to speak with the hospitalist on call if the hospitalist that took care of you is not available. Once you are discharged, your primary care physician will handle any further medical issues. Please note that NO REFILLS for any discharge medications will be authorized once you are discharged, as it is imperative that you return to your primary care physician (or establish a relationship with a primary care physician if you do not have one) for your aftercare needs so that they can reassess your need for medications and monitor your lab values.  DATA REVIEW:   CBC  No results for input(s): WBC, HGB, HCT, PLT in the last 168 hours.  Chemistries  No results for input(s): NA, K, CL, CO2, GLUCOSE, BUN, CREATININE, CALCIUM, MG, AST, ALT, ALKPHOS, BILITOT in the last 168 hours.  Invalid input(s): [place]  Microbiology Results   No results found for this or any previous visit (from the past 240  hour(s)).  RADIOLOGY:  No results found.   CODE STATUS:     Code Status Orders  (From admission, onward)           Start     Ordered   11/05/21 2313  Do not attempt resuscitation (DNR)  Continuous       Question Answer Comment  In the event of cardiac or respiratory ARREST Do not call a code blue   In the event of cardiac or respiratory ARREST Do not perform Intubation, CPR, defibrillation or ACLS   In the event of cardiac or respiratory ARREST Use medication by any route, position, wound care, and other measures to relive pain and suffering. May use oxygen, suction and manual treatment of airway obstruction as needed for comfort.      11/05/21 2314           Code Status History     Date Active Date Inactive Code Status Order ID Comments User Context   01/24/2018 1813 01/27/2018 1633 Full Code [id number]  Idelle Crouch, MD Inpatient   04/27/2017 0341 04/28/2017 1942 Full Code [id number]  Harrie Foreman, MD Inpatient   04/18/2017 1728 04/19/2017 2058 Full Code [id number]  Velvet Bathe, MD Inpatient  10/11/2016 2316 10/18/2016 1519 Full Code [id number]  Lance Coon, MD Inpatient   10/06/2016 1907 10/10/2016 1502 Full Code [id number]  Idelle Crouch, MD ED        TOTAL TIME TAKING CARE OF THIS PATIENT: 30 minutes.    Fritzi Mandes M.D  [facility]    CC: Primary care physician; Dion Body, MD

## 2021-11-27 NOTE — Progress Notes (Signed)
Called by RN and patient for SpO2 down to mid to high 80's. Upon entering, patient is awake and alert. Patient has personal pulse ox on finger stating that her oxygen is too low. Returned patient to heated HFNC 45L @ 100% for SpO2 90-91%.

## 2021-11-27 NOTE — Progress Notes (Signed)
Palliative Care Progress Note, Assessment & Plan   Patient Name: Margaret Stevenson       Date: 11/27/2021 DOB: August 11, 1946  Age: 75 y.o. MRN#: [id number] Attending Physician: Fritzi Mandes, MD Primary Care Physician: Dion Body, MD Admit Date: 11/05/2021  Reason for Consultation/Follow-up: Establishing goals of care  Subjective: Patient is sitting up in bed with high flow nasal cannula in place.  She does not open her eyes but participates in conversation.  She is attempting to eat breakfast with a fork but having some difficulty getting the food onto the fork and getting the fork to her mouth.  HPI: Patient is a 75 year old female with a past medical history significant for anxiety, paroxysmal A. Fib (Eliquis), history of melanoma with pulmonary nodules, and end-stage severe COPD that presented to the emergency department on 11/05/2021 with worsening respiratory symptoms. During this hospitalization patient has been weaned to 15 L of nasal cannula.  However, overnight patient was placed back on 45 L of high flow nasal cannula without use of morphine as discussed yesterday.     Palliative medicine was consulted to discuss goals of care.  As per our discussions yesterday, patient is awaiting a bed at the hospice inpatient unit.  Summary of counseling/coordination of care: After reviewing the medical records including epic notes, labs, and imaging, Dr. Posey Pronto and I assessed the patient at bedside.  Dr. Posey Pronto and I met with patient and patient's son Barnabas Lister to discuss disposition.    We reviewed what transpired overnight for patient to be moved from nasal cannula to high flow nasal cannula.  Patient reported she is using her personal pulse oximeter and when it reads 88 she was asking for more oxygen.  I  reviewed that instead of increasing her oxygen we had plan to use medications to manage her symptoms.  She said she was not offered this as an option.  We again reviewed that patient will not be able to leave the hospital on high flow nasal cannula.  She stated she would like to be moved back to regular nasal cannula.  Respiratory therapy called to bedside.  Nasal cannula placed and high flow nasal cannula removed from room.  I again reiterated that the goal is to get the patient out of the hospital into the hospice facility.  Patient asked if she was going to go there to die.  I shared yes, she will die at some point.  I also shared her goal is to ensure that whenever that time comes that she is out of pain and not gasping for air.  She said she was in agreement to start the morphine drip.  She asked me to "go now" so that we could get the morphine as soon as possible.  After exited the room Dr. Posey Pronto and I met with patient son to reiterate that increasing her oxygen supply is counterproductive to her being able to transfer out of the hospital.  Thomos Lemons understanding and shares that he is still in agreement to keep her at 15 L or less of nasal cannula so that she can be transported to the hospice home.  He is also in agreement to move forward with using morphine  drip and breakthrough as needed morphine to manage her symptoms.  Therapeutic silence and active listening offered for Barnabas Lister to express his thoughts and emotions regarding his mother's care.  Dr. Posey Pronto ordered morphine drip as well as Ativan and morphine as needed.  Nursing made aware that oxygen should not be titrated up.  Should patient have respiratory distress, air hunger, or increased work of breathing, morphine should be given.  Respiratory therapy also made aware that high flow nasal cannula should not be placed back to patient.    As per hospice liaison bed is available at hospice inpatient unit.  Patient set to be discharged today.   Questions and concerns from patient and family were addressed.   Code Status: DNR  Prognosis: < 2 weeks  Discharge Planning: Hospice facility  Recommendations/Plan: Morphine gtt Morphine IV PRN Ativan PRN  Care plan was discussed with Dr. Posey Pronto, nursing, patient, patient's son, RT, pharmacist  Physical Exam Vitals and nursing note reviewed.  Constitutional:      Appearance: She is ill-appearing.  HENT:     Head: Normocephalic and atraumatic.     Nose:     Comments: Dry blood to both nares    Mouth/Throat:     Mouth: Mucous membranes are dry.  Cardiovascular:     Rate and Rhythm: Normal rate.     Pulses: Normal pulses.  Pulmonary:     Effort: Pulmonary effort is normal.     Comments: 15L  Abdominal:     Palpations: Abdomen is soft.  Musculoskeletal:     Comments: Generalized weakness  Skin:    Comments: Bialteral UE cool to touch  Neurological:     Mental Status: She is alert. Mental status is at baseline.            Flowsheet Rows    Flowsheet Row Most Recent Value  Intake Tab   Referral Department Hospitalist  Unit at Time of Referral Intermediate Care Unit  Palliative Care Primary Diagnosis Pulmonary  Date Notified 11/12/21  Palliative Care Type New Palliative care  Reason for referral Clarify Goals of Care  Date of Admission 11/05/21  Date first seen by Palliative Care 11/13/21  # of days Palliative referral response time 1 Day(s)  # of days IP prior to Palliative referral 7  Clinical Assessment   Palliative Performance Scale Score 30%  Pain Max last 24 hours Not able to report  Pain Min Last 24 hours Not able to report  Dyspnea Max Last 24 Hours Not able to report  Dyspnea Min Last 24 hours Not able to report  Psychosocial & Spiritual Assessment   Palliative Care Outcomes         Total Time 25 minutes  Greater than 50%  of this time was spent counseling and coordinating care related to the above assessment and plan.  Thank you for  allowing the Palliative Medicine Team to assist in the care of this patient.  Butler Ilsa Iha, FNP-BC Palliative Medicine Team Team Phone # [phone]

## 2021-11-27 NOTE — Care Management Important Message (Signed)
Important Message  Patient Details  Name: Margaret Stevenson MRN: [id number] Date of Birth: Jul 03, 1946   Medicare Important Message Given:  Other (see comment)  Disposition to discharge with hospice services.  Medicare IM withheld at this time out of respect for patient and family.    Dannette Barbara 11/27/2021, 9:26 AM

## 2021-11-27 NOTE — TOC Transition Note (Signed)
Transition of Care [facility]) - CM/SW Discharge Note   Patient Details  Name: Margaret Stevenson MRN: [id number] Date of Birth: Apr 09, 1946  [facility]) CM/SW Contact:  Alberteen Sam, LCSW Phone Number: 11/27/2021, 12:47 PM   Clinical Narrative:     Patient will DC to: hospice home Anticipated DC date: 11/27/21 Transport JE:HUDJS (4:30)  Per MD patient ready for DC to hospice home. RN, patient, patient's family, and facility notified of DC. RN given number for report [phone]. . DC packet on chart. Ambulance transport requested for patient for 4:30. CSW signing off.  Pricilla Riffle, LCSW    Final next level of care: [place] Barriers to Discharge: No Barriers Identified   Patient Goals and CMS Choice Patient states their goals for this hospitalization and ongoing recovery are:: to go home CMS Medicare.gov Compare Post Acute Care list provided to:: Patient Represenative (must comment) Barnabas Lister (son)) Choice offered to / list presented to : Adult Children  Discharge Placement              Patient chooses bed at:  (hospice home) Patient to be transferred to facility by: [facility] Name of family member notified: [facility] to contact Patient and family notified of of transfer: 11/27/21  Discharge Plan and Services                                     Social Determinants of Health (SDOH) Interventions     Readmission Risk Interventions No flowsheet data found.

## 2021-11-27 NOTE — Progress Notes (Signed)
[facility] [place] NAME: Margaret Stevenson    MR#:  [id number]  DATE OF BIRTH:  1946/07/12  SUBJECTIVE:  son Trishelle Devora at bedside. Met with Palliative care NP in the room Pt got placed back on HHFNC last nite Pt and son met with hospice and agreed with going to hospice REVIEW OF SYSTEMS:   ROS   DRUG ALLERGIES:   Allergies  Allergen Reactions   Penicillins Hives    Has patient had a PCN reaction causing immediate rash, facial/tongue/throat swelling, SOB or lightheadedness with hypotension: no Has patient had a PCN reaction causing severe rash involving mucus membranes or skin necrosis: no Has patient had a PCN reaction that required hospitalization no Has patient had a PCN reaction occurring within the last 10 years: no If all of the above answers are "NO", then may proceed with Cephalosporin use.     VITALS:  Blood pressure 130/64, pulse 94, temperature 98.2 F (36.8 C), temperature source Axillary, resp. rate 17, height 5' 6.5" (1.689 m), weight 54 kg, SpO2 96 %.  PHYSICAL EXAMINATION:   Physical Exam  GENERAL:  75 y.o.-year-old patient lying in the bed with chronic respiratory distress. Thin cachectic HEENT: Head atraumatic, normocephalic.   LUNGS: distant breath sounds bilaterally, no wheezing, rales, rhonchi. CARDIOVASCULAR: S1, S2 normal. No murmurs, rubs, or gallops.  ABDOMEN: Soft, nontender, nondistended. EXTREMITIES: No cyanosis,  ++clubbing no edema b/l.    NEUROLOGIC: nonfocal PSYCHIATRIC:  patient is alert.  SKIN: No obvious rash, lesion, or ulcer.   LABORATORY PANEL:  CBC No results for input(s): WBC, HGB, HCT, PLT in the last 168 hours.   Chemistries  No results for input(s): NA, K, CL, CO2, GLUCOSE, BUN, CREATININE, CALCIUM, MG, AST, ALT, ALKPHOS, BILITOT in the last 168 hours.  Invalid input(s): GFRCGP  Cardiac Enzymes No results for input(s): TROPONINI in the last 168 hours. RADIOLOGY:  No  results found. ASSESSMENT AND PLAN:   Margaret Stevenson is a 75 y.o. female with medical history significant for COPD, chronic hypoxemic respiratory failure, pulmonary nodules, history of melanoma, who presented to the hospital because of difficulty breathing  Acute on chronic hypoxemic respiratory failure:  --She is not getting any better.  Oxygen requirement has gone up to oxygen via HFNC, 85% 45 L/min.  She had been on 65% for some time.  Difficult to wean oxygen. 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 She has been mentioning to the staff and also to delete that she is going to die here. -- Palliative care has been following. Discussed case with Dr.aleskerov and he is in agreement with hospice care if patient agrees to it. Will discuss with son. -- Chest x-ray from today does not show much improved --12/15-- I along with [facility] palliative care nurse practitioner patient's son Margaret Stevenson and patient had a long discussion regarding her end-stage severe COPD. She understands there is not much left to do as far as treatment. Discussed option about hospice facility and hospice at home. Discussed about morphine for symptom management. Also discussed about not chasing the oxygen saturation numbers and more symptom management. Patient is agreeable with starting morphine. Will continue to monitor and eventually work with her regarding discharging to hospice facility if she prefers that way. [facility] aware --Dr Lanney Gins agrees with above. --12/16-- morphine 1 mg IV Q2 PRN added. Continue oral morphine. Answered questions about oxygen here and hospice facility in case patient decides that route.  Son at bedside. Patient is not want physical therapy/occupational therapy. Orders discontinued  --12/17-- On 14L HFNC oxygen. Less anxious. Tolerating po morphine -12/18-- remains on 4 L high flow nasal cannula oxygen. Son at bedside. Several questions answered. Patient work some with  PT. Very fatigued. The sats very easily. Questions about hospice answered. 12/19-- patient and son would like to discuss with hospice. --12/20--pt is comfort care now. Discussed about quality of life and comfort as opposed to chasing oxygen saturations and discomfort with high flow oxygen. Patient and son agrees with morphine drip. Palliative care was present during the conversation. Patient will discharged to hospice facility when bed opens up  End-stage severe COPD exacerbation: Chest x-ray on 11/16/2021 showed increased interstitial markings. Continue steroids and bronchodilators. No additional antibiotics needed per pulmonologist..  Completed 5 days of azithromycin.  Paroxysmal atrial fibrillation with RVR: She still in A. fib with fluctuating heart rate.  Continue diltiazem, amiodarone and Eliquis.     Hypokalemia: Improved  Pulmonary nodules: PET scan in April 2022 suggestive of benign etiology.    Tobacco use disorder: Counseled to quit smoking. Nutrition Status: Nutrition Problem: Moderate Malnutrition Etiology: chronic illness (COPD) Signs/Symptoms: mild fat depletion, moderate fat depletion, mild muscle depletion, moderate muscle depletion Interventions: MVI, Magic cup  overall poor prognosis.  Family communication :Barnabas Lister son at bedside Consults : pulmonary, palliative CODE STATUS: DNR DVT Prophylaxis : Level of care: Progressive Status is: Inpatient  Remains inpatient appropriate because: poor respiratory status still requiring significant amount of oxygen.    TOTAL TIME TAKING CARE OF THIS PATIENT: 20 minutes.  >50% time spent on counselling and coordination of care  Note: This dictation was prepared with Dragon dictation along with smaller phrase technology. Any transcriptional errors that result from this process are unintentional.  Margaret Stevenson M.D    [facility]   CC: Primary care physician; Dion Body, MD Patient ID: Margaret Stevenson, female    DOB: 10-23-1946, 75 y.o.   MRN: [id number]

## 2021-11-27 NOTE — Progress Notes (Signed)
Transport ticket information incorrectly completed by [employer] Location of destination as [employer] not [place] where [facility] is located. Transport ticket corrected by this RN so [facility] would transport patient.   No call or message returned from [facility] at this time.

## 2021-11-27 NOTE — Progress Notes (Addendum)
[facility]) Hospital Liaison Note  Bed is available at hospice home today; however, per MD "last nite pt was placed back on heated high flow again' and dispo plan will be reevaluated following further investigation by hospital team.  MSW to continue to follow and remain available for dispo needs.   Addendum  Patient is back to 15 mLs of oxygen and family has accepted bed offer.    Please send signed DNR form with patient and RN call report to [phone].   Transport has been arranged for 4:30p with [facility]. IDT aware  Please do not hesitate to call with any hospice related questions.    Thank you for the opportunity to participate in this patient's care.  Daphene Calamity, MSW [facility] Liaison  [phone]

## 2021-12-21 ENCOUNTER — Ambulatory Visit: Payer: Medicare Other | Admitting: Cardiology
